# Patient Record
Sex: Male | Born: 1935 | Race: White | Hispanic: No | Marital: Married | Smoking: Former smoker
Health system: Southern US, Community
[De-identification: ages and names within clinical notes are randomized; demographics above are authoritative.]

## PROBLEM LIST (undated history)

## (undated) DIAGNOSIS — I219 Acute myocardial infarction, unspecified: Secondary | ICD-10-CM

## (undated) DIAGNOSIS — G2581 Restless legs syndrome: Secondary | ICD-10-CM

## (undated) DIAGNOSIS — I714 Abdominal aortic aneurysm, without rupture, unspecified: Secondary | ICD-10-CM

## (undated) DIAGNOSIS — Z8711 Personal history of peptic ulcer disease: Secondary | ICD-10-CM

## (undated) DIAGNOSIS — I739 Peripheral vascular disease, unspecified: Secondary | ICD-10-CM

## (undated) DIAGNOSIS — G4730 Sleep apnea, unspecified: Secondary | ICD-10-CM

## (undated) DIAGNOSIS — Z860100 Personal history of colon polyps, unspecified: Secondary | ICD-10-CM

## (undated) DIAGNOSIS — D649 Anemia, unspecified: Secondary | ICD-10-CM

## (undated) DIAGNOSIS — Z9289 Personal history of other medical treatment: Secondary | ICD-10-CM

## (undated) DIAGNOSIS — E785 Hyperlipidemia, unspecified: Secondary | ICD-10-CM

## (undated) DIAGNOSIS — I2510 Atherosclerotic heart disease of native coronary artery without angina pectoris: Secondary | ICD-10-CM

## (undated) DIAGNOSIS — H3530 Unspecified macular degeneration: Secondary | ICD-10-CM

## (undated) DIAGNOSIS — I10 Essential (primary) hypertension: Secondary | ICD-10-CM

## (undated) DIAGNOSIS — Z87898 Personal history of other specified conditions: Secondary | ICD-10-CM

## (undated) DIAGNOSIS — I4891 Unspecified atrial fibrillation: Secondary | ICD-10-CM

## (undated) DIAGNOSIS — R42 Dizziness and giddiness: Secondary | ICD-10-CM

## (undated) HISTORY — DX: Personal history of other specified conditions: Z87.898

## (undated) HISTORY — DX: Abdominal aortic aneurysm, without rupture: I71.4

## (undated) HISTORY — DX: Dizziness and giddiness: R42

## (undated) HISTORY — DX: Abdominal aortic aneurysm, without rupture, unspecified: I71.40

## (undated) HISTORY — DX: Personal history of other medical treatment: Z92.89

## (undated) HISTORY — DX: Peripheral vascular disease, unspecified: I73.9

## (undated) HISTORY — DX: Restless legs syndrome: G25.81

## (undated) HISTORY — PX: CATARACT EXTRACTION: SUR2

## (undated) HISTORY — DX: Sleep apnea, unspecified: G47.30

## (undated) HISTORY — DX: Unspecified macular degeneration: H35.30

## (undated) HISTORY — DX: Personal history of colonic polyps: Z86.010

## (undated) HISTORY — DX: Unspecified atrial fibrillation: I48.91

## (undated) HISTORY — PX: ABDOMINAL AORTIC ANEURYSM REPAIR: SUR1152

## (undated) HISTORY — DX: Hyperlipidemia, unspecified: E78.5

## (undated) HISTORY — DX: Personal history of colon polyps, unspecified: Z86.0100

## (undated) HISTORY — DX: Personal history of peptic ulcer disease: Z87.11

## (undated) HISTORY — DX: Acute myocardial infarction, unspecified: I21.9

## (undated) HISTORY — DX: Essential (primary) hypertension: I10

## (undated) HISTORY — PX: INGUINAL HERNIA REPAIR: SUR1180

## (undated) HISTORY — DX: Atherosclerotic heart disease of native coronary artery without angina pectoris: I25.10

## (undated) HISTORY — DX: Anemia, unspecified: D64.9

---

## 1995-12-23 HISTORY — PX: CORONARY ARTERY BYPASS GRAFT: SHX141

## 1999-06-21 ENCOUNTER — Emergency Department (HOSPITAL_COMMUNITY): Admission: EM | Admit: 1999-06-21 | Discharge: 1999-06-21 | Payer: Self-pay | Admitting: Emergency Medicine

## 1999-06-21 ENCOUNTER — Encounter: Payer: Self-pay | Admitting: Emergency Medicine

## 2000-02-22 LAB — HM COLONOSCOPY

## 2000-07-07 ENCOUNTER — Ambulatory Visit (HOSPITAL_COMMUNITY): Admission: RE | Admit: 2000-07-07 | Discharge: 2000-07-07 | Payer: Self-pay | Admitting: Gastroenterology

## 2000-07-27 ENCOUNTER — Encounter: Payer: Self-pay | Admitting: Emergency Medicine

## 2000-07-27 ENCOUNTER — Emergency Department (HOSPITAL_COMMUNITY): Admission: EM | Admit: 2000-07-27 | Discharge: 2000-07-27 | Payer: Self-pay | Admitting: *Deleted

## 2000-07-31 ENCOUNTER — Inpatient Hospital Stay (HOSPITAL_COMMUNITY): Admission: EM | Admit: 2000-07-31 | Discharge: 2000-08-03 | Payer: Self-pay | Admitting: Emergency Medicine

## 2000-07-31 ENCOUNTER — Encounter: Payer: Self-pay | Admitting: Emergency Medicine

## 2004-01-12 ENCOUNTER — Ambulatory Visit: Payer: Self-pay | Admitting: Internal Medicine

## 2004-02-09 ENCOUNTER — Ambulatory Visit: Payer: Self-pay | Admitting: Internal Medicine

## 2004-03-08 ENCOUNTER — Ambulatory Visit: Payer: Self-pay | Admitting: Internal Medicine

## 2004-04-08 ENCOUNTER — Ambulatory Visit: Payer: Self-pay | Admitting: Internal Medicine

## 2004-04-29 ENCOUNTER — Ambulatory Visit: Payer: Self-pay | Admitting: Internal Medicine

## 2004-06-07 ENCOUNTER — Ambulatory Visit: Payer: Self-pay | Admitting: Internal Medicine

## 2004-06-21 ENCOUNTER — Ambulatory Visit: Payer: Self-pay | Admitting: Internal Medicine

## 2004-07-05 ENCOUNTER — Ambulatory Visit: Payer: Self-pay | Admitting: Internal Medicine

## 2004-08-05 ENCOUNTER — Ambulatory Visit: Payer: Self-pay | Admitting: Internal Medicine

## 2004-09-06 ENCOUNTER — Ambulatory Visit: Payer: Self-pay | Admitting: Internal Medicine

## 2004-10-06 ENCOUNTER — Ambulatory Visit: Payer: Self-pay | Admitting: Internal Medicine

## 2004-11-05 ENCOUNTER — Ambulatory Visit: Payer: Self-pay | Admitting: Internal Medicine

## 2004-12-14 ENCOUNTER — Ambulatory Visit: Payer: Self-pay | Admitting: Internal Medicine

## 2005-01-12 ENCOUNTER — Ambulatory Visit: Payer: Self-pay | Admitting: Internal Medicine

## 2005-01-25 ENCOUNTER — Ambulatory Visit: Payer: Self-pay | Admitting: Internal Medicine

## 2005-02-09 ENCOUNTER — Ambulatory Visit: Payer: Self-pay | Admitting: Internal Medicine

## 2005-02-21 DIAGNOSIS — Z9289 Personal history of other medical treatment: Secondary | ICD-10-CM

## 2005-02-21 HISTORY — DX: Personal history of other medical treatment: Z92.89

## 2005-03-15 ENCOUNTER — Ambulatory Visit: Payer: Self-pay | Admitting: Internal Medicine

## 2005-04-12 ENCOUNTER — Ambulatory Visit: Payer: Self-pay | Admitting: Internal Medicine

## 2005-05-10 ENCOUNTER — Ambulatory Visit: Payer: Self-pay | Admitting: Internal Medicine

## 2005-05-28 ENCOUNTER — Inpatient Hospital Stay (HOSPITAL_COMMUNITY): Admission: EM | Admit: 2005-05-28 | Discharge: 2005-05-31 | Payer: Self-pay | Admitting: Emergency Medicine

## 2005-05-28 ENCOUNTER — Ambulatory Visit: Payer: Self-pay | Admitting: Internal Medicine

## 2005-05-30 ENCOUNTER — Encounter: Payer: Self-pay | Admitting: Vascular Surgery

## 2005-06-02 ENCOUNTER — Ambulatory Visit: Payer: Self-pay | Admitting: Internal Medicine

## 2005-06-06 ENCOUNTER — Ambulatory Visit: Payer: Self-pay | Admitting: Internal Medicine

## 2005-06-30 ENCOUNTER — Ambulatory Visit: Payer: Self-pay | Admitting: Internal Medicine

## 2005-08-02 ENCOUNTER — Ambulatory Visit: Payer: Self-pay | Admitting: Internal Medicine

## 2005-09-07 ENCOUNTER — Ambulatory Visit: Payer: Self-pay | Admitting: Internal Medicine

## 2005-10-11 ENCOUNTER — Ambulatory Visit: Payer: Self-pay | Admitting: Internal Medicine

## 2005-11-11 ENCOUNTER — Ambulatory Visit: Payer: Self-pay | Admitting: Internal Medicine

## 2005-12-08 ENCOUNTER — Ambulatory Visit: Payer: Self-pay | Admitting: Internal Medicine

## 2006-01-05 ENCOUNTER — Ambulatory Visit: Payer: Self-pay | Admitting: Internal Medicine

## 2006-02-01 ENCOUNTER — Ambulatory Visit: Payer: Self-pay | Admitting: Internal Medicine

## 2006-02-09 ENCOUNTER — Ambulatory Visit: Payer: Self-pay | Admitting: Internal Medicine

## 2006-03-10 ENCOUNTER — Ambulatory Visit: Payer: Self-pay | Admitting: Internal Medicine

## 2006-03-10 LAB — CONVERTED CEMR LAB
Albumin: 3.7 g/dL (ref 3.5–5.2)
Alkaline Phosphatase: 41 units/L (ref 39–117)
BUN: 22 mg/dL (ref 6–23)
GFR calc Af Amer: 63 mL/min
MCHC: 33.1 g/dL (ref 30.0–36.0)
Platelets: 316 10*3/uL (ref 150–400)
Potassium: 4.7 meq/L (ref 3.5–5.1)
RBC: 4.69 M/uL (ref 4.22–5.81)
RDW: 13.4 % (ref 11.5–14.6)
Total Protein: 6.8 g/dL (ref 6.0–8.3)

## 2006-04-03 ENCOUNTER — Ambulatory Visit: Payer: Self-pay | Admitting: Internal Medicine

## 2006-04-07 ENCOUNTER — Ambulatory Visit: Payer: Self-pay | Admitting: Internal Medicine

## 2006-05-03 ENCOUNTER — Ambulatory Visit: Payer: Self-pay | Admitting: Internal Medicine

## 2006-06-13 ENCOUNTER — Ambulatory Visit: Payer: Self-pay | Admitting: Internal Medicine

## 2006-06-13 LAB — CONVERTED CEMR LAB
Albumin: 3.6 g/dL (ref 3.5–5.2)
Alkaline Phosphatase: 35 units/L — ABNORMAL LOW (ref 39–117)
BUN: 22 mg/dL (ref 6–23)
Basophils Absolute: 0 10*3/uL (ref 0.0–0.1)
Cholesterol: 137 mg/dL (ref 0–200)
Creatinine, Ser: 1.2 mg/dL (ref 0.4–1.5)
GFR calc Af Amer: 75 mL/min
GFR calc non Af Amer: 62 mL/min
HDL: 41.9 mg/dL (ref >39.0)
Hemoglobin: 14 g/dL (ref 13.0–17.0)
LDL Cholesterol: 64 mg/dL (ref 0–99)
MCHC: 33.8 g/dL (ref 30.0–36.0)
Monocytes Absolute: 0.8 10*3/uL — ABNORMAL HIGH (ref 0.2–0.7)
Monocytes Relative: 9.9 % (ref 3.0–11.0)
Neutro Abs: 5.2 10*3/uL (ref 1.4–7.7)
Potassium: 4.4 meq/L (ref 3.5–5.1)
RDW: 14.1 % (ref 11.5–14.6)
Total CHOL/HDL Ratio: 3.3

## 2006-06-27 ENCOUNTER — Ambulatory Visit: Payer: Self-pay | Admitting: Internal Medicine

## 2006-07-11 ENCOUNTER — Ambulatory Visit: Payer: Self-pay | Admitting: Internal Medicine

## 2006-08-10 ENCOUNTER — Ambulatory Visit: Payer: Self-pay | Admitting: Internal Medicine

## 2006-08-18 DIAGNOSIS — Z860100 Personal history of colon polyps, unspecified: Secondary | ICD-10-CM | POA: Insufficient documentation

## 2006-08-18 DIAGNOSIS — I4892 Unspecified atrial flutter: Secondary | ICD-10-CM | POA: Insufficient documentation

## 2006-08-18 DIAGNOSIS — H3530 Unspecified macular degeneration: Secondary | ICD-10-CM | POA: Insufficient documentation

## 2006-08-18 DIAGNOSIS — I10 Essential (primary) hypertension: Secondary | ICD-10-CM | POA: Insufficient documentation

## 2006-08-18 DIAGNOSIS — Z9989 Dependence on other enabling machines and devices: Secondary | ICD-10-CM | POA: Insufficient documentation

## 2006-08-18 DIAGNOSIS — G4733 Obstructive sleep apnea (adult) (pediatric): Secondary | ICD-10-CM

## 2006-08-18 DIAGNOSIS — Z8711 Personal history of peptic ulcer disease: Secondary | ICD-10-CM | POA: Insufficient documentation

## 2006-08-18 DIAGNOSIS — N400 Enlarged prostate without lower urinary tract symptoms: Secondary | ICD-10-CM | POA: Insufficient documentation

## 2006-08-18 DIAGNOSIS — I2510 Atherosclerotic heart disease of native coronary artery without angina pectoris: Secondary | ICD-10-CM | POA: Insufficient documentation

## 2006-09-12 ENCOUNTER — Ambulatory Visit: Payer: Self-pay | Admitting: Internal Medicine

## 2006-09-12 DIAGNOSIS — H6120 Impacted cerumen, unspecified ear: Secondary | ICD-10-CM | POA: Insufficient documentation

## 2006-10-05 ENCOUNTER — Telehealth: Payer: Self-pay | Admitting: Internal Medicine

## 2006-10-10 ENCOUNTER — Ambulatory Visit: Payer: Self-pay | Admitting: Internal Medicine

## 2006-10-10 DIAGNOSIS — I4891 Unspecified atrial fibrillation: Secondary | ICD-10-CM | POA: Insufficient documentation

## 2006-10-10 LAB — CONVERTED CEMR LAB
INR: 2.7
Prothrombin Time: 20.1 s

## 2006-11-03 ENCOUNTER — Ambulatory Visit: Payer: Self-pay | Admitting: Internal Medicine

## 2006-11-03 LAB — CONVERTED CEMR LAB
INR: 2.7
Prothrombin Time: 19.9 s

## 2006-12-05 ENCOUNTER — Ambulatory Visit: Payer: Self-pay | Admitting: Internal Medicine

## 2007-01-09 ENCOUNTER — Ambulatory Visit: Payer: Self-pay | Admitting: Internal Medicine

## 2007-02-08 ENCOUNTER — Ambulatory Visit: Payer: Self-pay | Admitting: Internal Medicine

## 2007-02-08 LAB — CONVERTED CEMR LAB: INR: 3.1

## 2007-03-06 ENCOUNTER — Ambulatory Visit: Payer: Self-pay | Admitting: Internal Medicine

## 2007-04-03 ENCOUNTER — Ambulatory Visit: Payer: Self-pay | Admitting: Internal Medicine

## 2007-04-03 ENCOUNTER — Telehealth: Payer: Self-pay | Admitting: Internal Medicine

## 2007-05-02 ENCOUNTER — Ambulatory Visit: Payer: Self-pay | Admitting: Internal Medicine

## 2007-05-02 LAB — CONVERTED CEMR LAB: Prothrombin Time: 19.9 s

## 2007-06-04 ENCOUNTER — Ambulatory Visit: Payer: Self-pay | Admitting: Internal Medicine

## 2007-06-18 LAB — CONVERTED CEMR LAB: INR: 3.3

## 2007-07-02 ENCOUNTER — Ambulatory Visit: Payer: Self-pay | Admitting: Internal Medicine

## 2007-07-02 LAB — CONVERTED CEMR LAB: INR: 3.6

## 2007-07-10 ENCOUNTER — Telehealth: Payer: Self-pay | Admitting: Internal Medicine

## 2007-07-31 ENCOUNTER — Ambulatory Visit: Payer: Self-pay | Admitting: Internal Medicine

## 2007-07-31 LAB — CONVERTED CEMR LAB: Prothrombin Time: 17.4 s

## 2007-08-28 ENCOUNTER — Ambulatory Visit: Payer: Self-pay | Admitting: Internal Medicine

## 2007-08-28 LAB — CONVERTED CEMR LAB
INR: 1.7
Prothrombin Time: 16.1 s

## 2007-09-07 ENCOUNTER — Ambulatory Visit: Payer: Self-pay | Admitting: Internal Medicine

## 2007-09-07 DIAGNOSIS — L259 Unspecified contact dermatitis, unspecified cause: Secondary | ICD-10-CM | POA: Insufficient documentation

## 2007-09-25 ENCOUNTER — Ambulatory Visit: Payer: Self-pay | Admitting: Internal Medicine

## 2007-09-25 LAB — CONVERTED CEMR LAB: Prothrombin Time: 19.5 s

## 2007-11-01 ENCOUNTER — Ambulatory Visit: Payer: Self-pay | Admitting: Internal Medicine

## 2007-11-01 LAB — CONVERTED CEMR LAB: Prothrombin Time: 19.5 s

## 2007-11-07 ENCOUNTER — Ambulatory Visit: Payer: Self-pay | Admitting: Internal Medicine

## 2007-11-07 ENCOUNTER — Telehealth: Payer: Self-pay | Admitting: Internal Medicine

## 2007-11-07 DIAGNOSIS — M549 Dorsalgia, unspecified: Secondary | ICD-10-CM | POA: Insufficient documentation

## 2007-11-09 ENCOUNTER — Ambulatory Visit: Payer: Self-pay | Admitting: Internal Medicine

## 2007-11-17 ENCOUNTER — Emergency Department (HOSPITAL_COMMUNITY): Admission: EM | Admit: 2007-11-17 | Discharge: 2007-11-17 | Payer: Self-pay | Admitting: Emergency Medicine

## 2007-11-19 ENCOUNTER — Ambulatory Visit: Payer: Self-pay | Admitting: Internal Medicine

## 2007-11-19 DIAGNOSIS — K5900 Constipation, unspecified: Secondary | ICD-10-CM | POA: Insufficient documentation

## 2007-12-12 ENCOUNTER — Ambulatory Visit: Payer: Self-pay | Admitting: Internal Medicine

## 2007-12-12 LAB — CONVERTED CEMR LAB
ALT: 30 units/L (ref 0–53)
AST: 25 units/L (ref 0–37)
Alkaline Phosphatase: 43 units/L (ref 39–117)
Basophils Absolute: 0 10*3/uL (ref 0.0–0.1)
Basophils Relative: 0.2 % (ref 0.0–3.0)
Bilirubin, Direct: 0.1 mg/dL (ref 0.0–0.3)
CO2: 30 meq/L (ref 19–32)
Chloride: 107 meq/L (ref 96–112)
Cholesterol: 156 mg/dL (ref 0–200)
Creatinine, Ser: 1.4 mg/dL (ref 0.4–1.5)
Eosinophils Absolute: 0.2 10*3/uL (ref 0.0–0.7)
GFR calc non Af Amer: 52 mL/min
HDL: 40.1 mg/dL (ref >39.0)
LDL Cholesterol: 77 mg/dL (ref 0–99)
Lymphocytes Relative: 15.5 % (ref 12.0–46.0)
MCHC: 34.8 g/dL (ref 30.0–36.0)
Neutrophils Relative %: 74.7 % (ref 43.0–77.0)
Platelets: 223 10*3/uL (ref 150–400)
Potassium: 4.8 meq/L (ref 3.5–5.1)
RBC: 4.52 M/uL (ref 4.22–5.81)
Total Bilirubin: 0.8 mg/dL (ref 0.3–1.2)
VLDL: 39 mg/dL (ref 0–40)
WBC: 9.2 10*3/uL (ref 4.5–10.5)

## 2008-01-07 ENCOUNTER — Ambulatory Visit: Payer: Self-pay | Admitting: Internal Medicine

## 2008-01-29 ENCOUNTER — Ambulatory Visit: Payer: Self-pay | Admitting: Internal Medicine

## 2008-01-29 LAB — CONVERTED CEMR LAB
INR: 2.8
Prothrombin Time: 20.2 s

## 2008-03-06 ENCOUNTER — Ambulatory Visit: Payer: Self-pay | Admitting: Internal Medicine

## 2008-03-06 LAB — CONVERTED CEMR LAB
INR: 2.8
Prothrombin Time: 20.2 s

## 2008-04-10 ENCOUNTER — Telehealth: Payer: Self-pay | Admitting: Internal Medicine

## 2008-04-10 ENCOUNTER — Ambulatory Visit: Payer: Self-pay | Admitting: Internal Medicine

## 2008-04-10 LAB — CONVERTED CEMR LAB
INR: 3.7
Prothrombin Time: 23.2 s

## 2008-05-08 ENCOUNTER — Ambulatory Visit: Payer: Self-pay | Admitting: Internal Medicine

## 2008-06-13 ENCOUNTER — Ambulatory Visit: Payer: Self-pay | Admitting: Internal Medicine

## 2008-06-13 LAB — CONVERTED CEMR LAB: Prothrombin Time: 17.8 s

## 2008-06-21 ENCOUNTER — Telehealth: Payer: Self-pay | Admitting: Family Medicine

## 2008-06-23 ENCOUNTER — Telehealth: Payer: Self-pay | Admitting: Internal Medicine

## 2008-06-23 ENCOUNTER — Ambulatory Visit: Payer: Self-pay | Admitting: Internal Medicine

## 2008-06-23 DIAGNOSIS — R5383 Other fatigue: Secondary | ICD-10-CM

## 2008-06-23 DIAGNOSIS — R5381 Other malaise: Secondary | ICD-10-CM | POA: Insufficient documentation

## 2008-06-23 LAB — CONVERTED CEMR LAB
AST: 26 units/L (ref 0–37)
Albumin: 3.9 g/dL (ref 3.5–5.2)
Alkaline Phosphatase: 45 units/L (ref 39–117)
Basophils Absolute: 0.1 10*3/uL (ref 0.0–0.1)
Bilirubin, Direct: 0.2 mg/dL (ref 0.0–0.3)
CO2: 28 meq/L (ref 19–32)
Calcium: 9.7 mg/dL (ref 8.4–10.5)
Eosinophils Relative: 2.1 % (ref 0.0–5.0)
GFR calc non Af Amer: 51.5 mL/min (ref >60)
Glucose, Bld: 89 mg/dL (ref 70–99)
Lymphs Abs: 1.2 10*3/uL (ref 0.7–4.0)
Monocytes Absolute: 0.3 10*3/uL (ref 0.1–1.0)
Monocytes Relative: 4.3 % (ref 3.0–12.0)
Neutrophils Relative %: 77.4 % — ABNORMAL HIGH (ref 43.0–77.0)
Platelets: 198 10*3/uL (ref 150.0–400.0)
Potassium: 5.1 meq/L (ref 3.5–5.1)
RDW: 13.5 % (ref 11.5–14.6)
Sodium: 140 meq/L (ref 135–145)
WBC: 7.8 10*3/uL (ref 4.5–10.5)

## 2008-07-02 ENCOUNTER — Ambulatory Visit: Payer: Self-pay | Admitting: Internal Medicine

## 2008-07-02 LAB — CONVERTED CEMR LAB: INR: 1.8

## 2008-07-28 ENCOUNTER — Telehealth (INDEPENDENT_AMBULATORY_CARE_PROVIDER_SITE_OTHER): Payer: Self-pay | Admitting: *Deleted

## 2008-07-28 ENCOUNTER — Ambulatory Visit: Payer: Self-pay | Admitting: Internal Medicine

## 2008-07-28 DIAGNOSIS — R42 Dizziness and giddiness: Secondary | ICD-10-CM | POA: Insufficient documentation

## 2008-07-28 LAB — CONVERTED CEMR LAB: Prothrombin Time: 13.8 s

## 2008-08-01 ENCOUNTER — Inpatient Hospital Stay (HOSPITAL_COMMUNITY): Admission: EM | Admit: 2008-08-01 | Discharge: 2008-08-05 | Payer: Self-pay | Admitting: Emergency Medicine

## 2008-08-01 ENCOUNTER — Ambulatory Visit: Payer: Self-pay

## 2008-08-01 ENCOUNTER — Encounter: Payer: Self-pay | Admitting: Internal Medicine

## 2008-08-01 ENCOUNTER — Telehealth: Payer: Self-pay | Admitting: Family Medicine

## 2008-08-04 ENCOUNTER — Encounter (INDEPENDENT_AMBULATORY_CARE_PROVIDER_SITE_OTHER): Payer: Self-pay | Admitting: Cardiology

## 2008-08-05 ENCOUNTER — Telehealth: Payer: Self-pay | Admitting: Internal Medicine

## 2008-08-08 ENCOUNTER — Telehealth: Payer: Self-pay | Admitting: Internal Medicine

## 2008-08-08 ENCOUNTER — Ambulatory Visit: Payer: Self-pay | Admitting: Internal Medicine

## 2008-08-08 LAB — CONVERTED CEMR LAB: INR: 5

## 2008-08-21 ENCOUNTER — Ambulatory Visit: Payer: Self-pay | Admitting: Internal Medicine

## 2008-08-21 LAB — CONVERTED CEMR LAB
INR: 2
Prothrombin Time: 17.6 s

## 2008-09-03 ENCOUNTER — Ambulatory Visit: Payer: Self-pay | Admitting: Family Medicine

## 2008-09-03 LAB — CONVERTED CEMR LAB
Albumin: 3.3 g/dL — ABNORMAL LOW (ref 3.5–5.2)
Alkaline Phosphatase: 42 units/L (ref 39–117)
BUN: 34 mg/dL — ABNORMAL HIGH (ref 6–23)
Basophils Absolute: 0.1 10*3/uL (ref 0.0–0.1)
Calcium: 9 mg/dL (ref 8.4–10.5)
Creatinine, Ser: 2.2 mg/dL — ABNORMAL HIGH (ref 0.4–1.5)
GFR calc non Af Amer: 30.56 mL/min (ref >60)
Glucose, Bld: 154 mg/dL — ABNORMAL HIGH (ref 70–99)
HCT: 39.8 % (ref 39.0–52.0)
Lymphocytes Relative: 5.7 % — ABNORMAL LOW (ref 12.0–46.0)
Lymphs Abs: 1 10*3/uL (ref 0.7–4.0)
Monocytes Relative: 4.8 % (ref 3.0–12.0)
Neutrophils Relative %: 88.9 % — ABNORMAL HIGH (ref 43.0–77.0)
Nitrite: NEGATIVE
Platelets: 198 10*3/uL (ref 150.0–400.0)
RDW: 13.7 % (ref 11.5–14.6)
Specific Gravity, Urine: 1.015
TSH: 1.35 microintl units/mL (ref 0.35–5.50)
Total Bilirubin: 1 mg/dL (ref 0.3–1.2)
Urobilinogen, UA: 0.2
WBC Urine, dipstick: NEGATIVE
WBC: 17.3 10*3/uL — ABNORMAL HIGH (ref 4.5–10.5)

## 2008-09-04 ENCOUNTER — Ambulatory Visit: Payer: Self-pay | Admitting: Internal Medicine

## 2008-09-04 ENCOUNTER — Inpatient Hospital Stay (HOSPITAL_COMMUNITY): Admission: AD | Admit: 2008-09-04 | Discharge: 2008-09-11 | Payer: Self-pay | Admitting: Internal Medicine

## 2008-09-04 DIAGNOSIS — L02419 Cutaneous abscess of limb, unspecified: Secondary | ICD-10-CM | POA: Insufficient documentation

## 2008-09-04 DIAGNOSIS — I959 Hypotension, unspecified: Secondary | ICD-10-CM | POA: Insufficient documentation

## 2008-09-04 DIAGNOSIS — L03119 Cellulitis of unspecified part of limb: Secondary | ICD-10-CM

## 2008-09-04 DIAGNOSIS — E860 Dehydration: Secondary | ICD-10-CM | POA: Insufficient documentation

## 2008-09-05 ENCOUNTER — Encounter: Payer: Self-pay | Admitting: Internal Medicine

## 2008-09-05 ENCOUNTER — Encounter (INDEPENDENT_AMBULATORY_CARE_PROVIDER_SITE_OTHER): Payer: Self-pay | Admitting: Internal Medicine

## 2008-09-05 ENCOUNTER — Ambulatory Visit: Payer: Self-pay | Admitting: Vascular Surgery

## 2008-09-15 ENCOUNTER — Ambulatory Visit: Payer: Self-pay | Admitting: Internal Medicine

## 2008-09-15 DIAGNOSIS — Z95828 Presence of other vascular implants and grafts: Secondary | ICD-10-CM | POA: Insufficient documentation

## 2008-09-29 ENCOUNTER — Ambulatory Visit: Payer: Self-pay | Admitting: Internal Medicine

## 2008-09-29 LAB — CONVERTED CEMR LAB: Prothrombin Time: 16.2 s

## 2008-10-02 ENCOUNTER — Ambulatory Visit: Payer: Self-pay | Admitting: Internal Medicine

## 2008-10-07 ENCOUNTER — Ambulatory Visit: Payer: Self-pay | Admitting: Internal Medicine

## 2008-10-21 ENCOUNTER — Encounter: Payer: Self-pay | Admitting: Internal Medicine

## 2008-10-23 ENCOUNTER — Ambulatory Visit: Payer: Self-pay | Admitting: Internal Medicine

## 2008-10-23 LAB — CONVERTED CEMR LAB
INR: 1.5
Prothrombin Time: 15 s

## 2008-11-13 ENCOUNTER — Ambulatory Visit: Payer: Self-pay | Admitting: Internal Medicine

## 2008-11-13 LAB — CONVERTED CEMR LAB: Prothrombin Time: 22.3 s

## 2008-12-01 ENCOUNTER — Ambulatory Visit: Payer: Self-pay | Admitting: Internal Medicine

## 2008-12-01 LAB — CONVERTED CEMR LAB: Prothrombin Time: 19 s

## 2009-01-09 ENCOUNTER — Ambulatory Visit: Payer: Self-pay | Admitting: Internal Medicine

## 2009-01-09 LAB — CONVERTED CEMR LAB: Prothrombin Time: 21 s

## 2009-01-12 ENCOUNTER — Ambulatory Visit: Payer: Self-pay | Admitting: Internal Medicine

## 2009-01-12 ENCOUNTER — Inpatient Hospital Stay (HOSPITAL_COMMUNITY): Admission: EM | Admit: 2009-01-12 | Discharge: 2009-01-14 | Payer: Self-pay | Admitting: Emergency Medicine

## 2009-01-19 ENCOUNTER — Ambulatory Visit: Payer: Self-pay | Admitting: Internal Medicine

## 2009-01-30 ENCOUNTER — Ambulatory Visit: Payer: Self-pay | Admitting: Internal Medicine

## 2009-02-12 ENCOUNTER — Ambulatory Visit: Payer: Self-pay | Admitting: Internal Medicine

## 2009-02-27 ENCOUNTER — Ambulatory Visit: Payer: Self-pay | Admitting: Internal Medicine

## 2009-03-09 ENCOUNTER — Telehealth: Payer: Self-pay | Admitting: Internal Medicine

## 2009-04-03 ENCOUNTER — Ambulatory Visit: Payer: Self-pay | Admitting: Internal Medicine

## 2009-04-03 DIAGNOSIS — J069 Acute upper respiratory infection, unspecified: Secondary | ICD-10-CM | POA: Insufficient documentation

## 2009-04-03 LAB — CONVERTED CEMR LAB: INR: 15

## 2009-04-13 ENCOUNTER — Telehealth: Payer: Self-pay | Admitting: Internal Medicine

## 2009-04-24 ENCOUNTER — Ambulatory Visit: Payer: Self-pay | Admitting: Internal Medicine

## 2009-05-14 ENCOUNTER — Ambulatory Visit: Payer: Self-pay | Admitting: Internal Medicine

## 2009-05-14 LAB — CONVERTED CEMR LAB
INR: 1.6
Prothrombin Time: 15.3 s

## 2009-05-29 ENCOUNTER — Ambulatory Visit: Payer: Self-pay | Admitting: Internal Medicine

## 2009-05-29 LAB — CONVERTED CEMR LAB
INR: 1.9
Prothrombin Time: 17 s

## 2009-06-26 ENCOUNTER — Ambulatory Visit: Payer: Self-pay | Admitting: Internal Medicine

## 2009-07-24 ENCOUNTER — Ambulatory Visit: Payer: Self-pay | Admitting: Internal Medicine

## 2009-08-07 ENCOUNTER — Encounter: Payer: Self-pay | Admitting: Internal Medicine

## 2009-08-19 ENCOUNTER — Ambulatory Visit: Payer: Self-pay | Admitting: Internal Medicine

## 2009-08-28 ENCOUNTER — Ambulatory Visit: Payer: Self-pay | Admitting: Internal Medicine

## 2009-08-28 LAB — CONVERTED CEMR LAB: INR: 2.6

## 2009-10-02 ENCOUNTER — Ambulatory Visit: Payer: Self-pay | Admitting: Internal Medicine

## 2009-10-23 ENCOUNTER — Ambulatory Visit: Payer: Self-pay | Admitting: Internal Medicine

## 2009-12-04 ENCOUNTER — Ambulatory Visit: Payer: Self-pay | Admitting: Internal Medicine

## 2009-12-04 DIAGNOSIS — E785 Hyperlipidemia, unspecified: Secondary | ICD-10-CM | POA: Insufficient documentation

## 2009-12-04 DIAGNOSIS — G2581 Restless legs syndrome: Secondary | ICD-10-CM | POA: Insufficient documentation

## 2009-12-25 ENCOUNTER — Ambulatory Visit: Payer: Self-pay | Admitting: Internal Medicine

## 2010-01-22 ENCOUNTER — Ambulatory Visit: Payer: Self-pay | Admitting: Internal Medicine

## 2010-01-29 ENCOUNTER — Telehealth: Payer: Self-pay | Admitting: Internal Medicine

## 2010-02-05 ENCOUNTER — Telehealth: Payer: Self-pay | Admitting: Internal Medicine

## 2010-02-18 ENCOUNTER — Ambulatory Visit: Payer: Self-pay | Admitting: Internal Medicine

## 2010-02-18 LAB — CONVERTED CEMR LAB: INR: 2.6

## 2010-02-21 DIAGNOSIS — D649 Anemia, unspecified: Secondary | ICD-10-CM

## 2010-02-21 DIAGNOSIS — I219 Acute myocardial infarction, unspecified: Secondary | ICD-10-CM

## 2010-02-21 HISTORY — PX: CARDIAC CATHETERIZATION: SHX172

## 2010-02-21 HISTORY — DX: Anemia, unspecified: D64.9

## 2010-02-21 HISTORY — DX: Acute myocardial infarction, unspecified: I21.9

## 2010-03-15 ENCOUNTER — Inpatient Hospital Stay (HOSPITAL_COMMUNITY)
Admission: EM | Admit: 2010-03-15 | Discharge: 2010-03-25 | DRG: 281 | Disposition: A | Discharge status: Home / Self Care | Payer: Medicare Other | Attending: Cardiology | Admitting: Cardiology

## 2010-03-15 DIAGNOSIS — I059 Rheumatic mitral valve disease, unspecified: Secondary | ICD-10-CM | POA: Diagnosis present

## 2010-03-15 DIAGNOSIS — I714 Abdominal aortic aneurysm, without rupture, unspecified: Secondary | ICD-10-CM | POA: Diagnosis present

## 2010-03-15 DIAGNOSIS — I25810 Atherosclerosis of coronary artery bypass graft(s) without angina pectoris: Secondary | ICD-10-CM | POA: Diagnosis present

## 2010-03-15 DIAGNOSIS — E785 Hyperlipidemia, unspecified: Secondary | ICD-10-CM | POA: Diagnosis present

## 2010-03-15 DIAGNOSIS — D509 Iron deficiency anemia, unspecified: Secondary | ICD-10-CM | POA: Diagnosis present

## 2010-03-15 DIAGNOSIS — I214 Non-ST elevation (NSTEMI) myocardial infarction: Principal | ICD-10-CM | POA: Diagnosis present

## 2010-03-15 DIAGNOSIS — I4892 Unspecified atrial flutter: Secondary | ICD-10-CM | POA: Diagnosis present

## 2010-03-15 DIAGNOSIS — H3530 Unspecified macular degeneration: Secondary | ICD-10-CM | POA: Diagnosis present

## 2010-03-15 DIAGNOSIS — H548 Legal blindness, as defined in USA: Secondary | ICD-10-CM | POA: Diagnosis present

## 2010-03-15 DIAGNOSIS — G4733 Obstructive sleep apnea (adult) (pediatric): Secondary | ICD-10-CM | POA: Diagnosis present

## 2010-03-15 DIAGNOSIS — D62 Acute posthemorrhagic anemia: Secondary | ICD-10-CM | POA: Diagnosis present

## 2010-03-15 DIAGNOSIS — I2582 Chronic total occlusion of coronary artery: Secondary | ICD-10-CM | POA: Diagnosis present

## 2010-03-16 LAB — POCT CARDIAC MARKERS
CKMB, poc: 2.8 ng/mL (ref 1.0–8.0)
Troponin i, poc: <0.05 ng/mL (ref 0.00–0.09)

## 2010-03-16 LAB — COMPREHENSIVE METABOLIC PANEL
ALT: 19 U/L (ref 0–53)
BUN: 18 mg/dL (ref 6–23)
CO2: 23 mEq/L (ref 19–32)
Calcium: 8.5 mg/dL (ref 8.4–10.5)
Creatinine, Ser: 1.09 mg/dL (ref 0.4–1.5)
GFR calc non Af Amer: >60 mL/min (ref >60)
Glucose, Bld: 87 mg/dL (ref 70–99)
Sodium: 140 mEq/L (ref 135–145)
Total Protein: 7 g/dL (ref 6.0–8.3)

## 2010-03-16 LAB — DIFFERENTIAL
Basophils Absolute: 0.1 10*3/uL (ref 0.0–0.1)
Eosinophils Absolute: 0.2 10*3/uL (ref 0.0–0.7)
Lymphocytes Relative: 17 % (ref 12–46)
Lymphs Abs: 1.4 10*3/uL (ref 0.7–4.0)
Neutrophils Relative %: 67 % (ref 43–77)

## 2010-03-16 LAB — CK TOTAL AND CKMB (NOT AT ARMC)
CK, MB: 4.5 ng/mL — ABNORMAL HIGH (ref 0.3–4.0)
Total CK: 118 U/L (ref 7–232)

## 2010-03-16 LAB — CBC
MCV: 74.4 fL — ABNORMAL LOW (ref 78.0–100.0)
Platelets: 443 10*3/uL — ABNORMAL HIGH (ref 150–400)
RBC: 3.59 MIL/uL — ABNORMAL LOW (ref 4.22–5.81)
RDW: 18.4 % — ABNORMAL HIGH (ref 11.5–15.5)
WBC: 8 10*3/uL (ref 4.0–10.5)

## 2010-03-16 LAB — PROTIME-INR: INR: 2.49 — ABNORMAL HIGH (ref 0.00–1.49)

## 2010-03-16 LAB — TROPONIN I: Troponin I: 0.16 ng/mL — ABNORMAL HIGH (ref 0.00–0.06)

## 2010-03-17 LAB — TYPE AND SCREEN
ABO/RH(D): A POS
Antibody Screen: NEGATIVE
Unit division: 0

## 2010-03-17 LAB — PROTIME-INR
INR: 2.33 — ABNORMAL HIGH (ref 0.00–1.49)
INR: 1.89 — ABNORMAL HIGH (ref 0.00–1.49)
Prothrombin Time: 25.7 seconds — ABNORMAL HIGH (ref 11.6–15.2)
Prothrombin Time: 21.9 seconds — ABNORMAL HIGH (ref 11.6–15.2)

## 2010-03-17 LAB — GLUCOSE, CAPILLARY: Glucose-Capillary: 116 mg/dL — ABNORMAL HIGH (ref 70–99)

## 2010-03-17 LAB — FERRITIN: Ferritin: 7 ng/mL — ABNORMAL LOW (ref 22–322)

## 2010-03-17 LAB — CARDIAC PANEL(CRET KIN+CKTOT+MB+TROPI)
CK, MB: 10.2 ng/mL (ref 0.3–4.0)
Relative Index: 7.6 — ABNORMAL HIGH (ref 0.0–2.5)
Relative Index: 6.3 — ABNORMAL HIGH (ref 0.0–2.5)
Total CK: 164 U/L (ref 7–232)
Total CK: 163 U/L (ref 7–232)
Troponin I: 2.71 ng/mL (ref 0.00–0.06)

## 2010-03-17 LAB — CBC
HCT: 29.6 % — ABNORMAL LOW (ref 39.0–52.0)
Hemoglobin: 8.7 g/dL — ABNORMAL LOW (ref 13.0–17.0)
Hemoglobin: 8.8 g/dL — ABNORMAL LOW (ref 13.0–17.0)
MCHC: 29.4 g/dL — ABNORMAL LOW (ref 30.0–36.0)
Platelets: 297 10*3/uL (ref 150–400)
RBC: 3.92 MIL/uL — ABNORMAL LOW (ref 4.22–5.81)
RDW: 18.2 % — ABNORMAL HIGH (ref 11.5–15.5)
WBC: 9.4 10*3/uL (ref 4.0–10.5)
WBC: 7.9 10*3/uL (ref 4.0–10.5)

## 2010-03-17 LAB — DIFFERENTIAL
Basophils Absolute: 0 10*3/uL (ref 0.0–0.1)
Basophils Relative: 0 % (ref 0–1)
Eosinophils Absolute: 0.2 10*3/uL (ref 0.0–0.7)
Neutro Abs: 5.4 10*3/uL (ref 1.7–7.7)
Neutrophils Relative %: 68 % (ref 43–77)

## 2010-03-17 LAB — BASIC METABOLIC PANEL
CO2: 24 mEq/L (ref 19–32)
Chloride: 105 mEq/L (ref 96–112)
Creatinine, Ser: 1.29 mg/dL (ref 0.4–1.5)
GFR calc Af Amer: >60 mL/min (ref >60)
Potassium: 4.2 mEq/L (ref 3.5–5.1)
Sodium: 138 mEq/L (ref 135–145)

## 2010-03-17 LAB — COMPREHENSIVE METABOLIC PANEL
Albumin: 3.1 g/dL — ABNORMAL LOW (ref 3.5–5.2)
BUN: 15 mg/dL (ref 6–23)
Calcium: 8.6 mg/dL (ref 8.4–10.5)
Glucose, Bld: 118 mg/dL — ABNORMAL HIGH (ref 70–99)
Potassium: 4.5 mEq/L (ref 3.5–5.1)
Sodium: 141 mEq/L (ref 135–145)
Total Protein: 6.5 g/dL (ref 6.0–8.3)

## 2010-03-17 LAB — CK TOTAL AND CKMB (NOT AT ARMC): Relative Index: 9.5 — ABNORMAL HIGH (ref 0.0–2.5)

## 2010-03-17 LAB — TRANSFERRIN: Transferrin: 332 mg/dL (ref 212–360)

## 2010-03-17 LAB — TSH: TSH: 2.523 u[IU]/mL (ref 0.350–4.500)

## 2010-03-17 LAB — BRAIN NATRIURETIC PEPTIDE: Pro B Natriuretic peptide (BNP): 556 pg/mL — ABNORMAL HIGH (ref 0.0–100.0)

## 2010-03-17 LAB — PREPARE RBC (CROSSMATCH)

## 2010-03-17 LAB — MRSA PCR SCREENING: MRSA by PCR: NEGATIVE

## 2010-03-17 LAB — HEPARIN LEVEL (UNFRACTIONATED): Heparin Unfractionated: 0.22 IU/mL — ABNORMAL LOW (ref 0.30–0.70)

## 2010-03-18 LAB — BASIC METABOLIC PANEL
CO2: 24 mEq/L (ref 19–32)
Calcium: 8.7 mg/dL (ref 8.4–10.5)
Creatinine, Ser: 1.16 mg/dL (ref 0.4–1.5)
GFR calc Af Amer: >60 mL/min (ref >60)
GFR calc non Af Amer: 60 mL/min — ABNORMAL LOW (ref >60)
Glucose, Bld: 111 mg/dL — ABNORMAL HIGH (ref 70–99)

## 2010-03-18 LAB — PROTIME-INR
INR: 1.55 — ABNORMAL HIGH (ref 0.00–1.49)
Prothrombin Time: 18.8 seconds — ABNORMAL HIGH (ref 11.6–15.2)

## 2010-03-18 LAB — CBC
MCH: 22.4 pg — ABNORMAL LOW (ref 26.0–34.0)
Platelets: 289 10*3/uL (ref 150–400)
RBC: 4.01 MIL/uL — ABNORMAL LOW (ref 4.22–5.81)
WBC: 8.8 10*3/uL (ref 4.0–10.5)

## 2010-03-19 ENCOUNTER — Ambulatory Visit: Admit: 2010-03-19 | Payer: Self-pay | Admitting: Internal Medicine

## 2010-03-19 LAB — BASIC METABOLIC PANEL
CO2: 23 mEq/L (ref 19–32)
Calcium: 8.8 mg/dL (ref 8.4–10.5)
Creatinine, Ser: 1.34 mg/dL (ref 0.4–1.5)
Glucose, Bld: 109 mg/dL — ABNORMAL HIGH (ref 70–99)

## 2010-03-19 LAB — CBC
MCH: 22.3 pg — ABNORMAL LOW (ref 26.0–34.0)
MCV: 76.1 fL — ABNORMAL LOW (ref 78.0–100.0)
Platelets: 309 10*3/uL (ref 150–400)
RDW: 19.7 % — ABNORMAL HIGH (ref 11.5–15.5)
WBC: 9.5 10*3/uL (ref 4.0–10.5)

## 2010-03-20 LAB — BASIC METABOLIC PANEL
CO2: 23 mEq/L (ref 19–32)
Chloride: 105 mEq/L (ref 96–112)
Creatinine, Ser: 1.41 mg/dL (ref 0.4–1.5)
GFR calc Af Amer: 58 mL/min — ABNORMAL LOW (ref >60)
Potassium: 3.8 mEq/L (ref 3.5–5.1)

## 2010-03-20 LAB — CBC
HCT: 27.7 % — ABNORMAL LOW (ref 39.0–52.0)
Hemoglobin: 8.1 g/dL — ABNORMAL LOW (ref 13.0–17.0)
MCHC: 29.2 g/dL — ABNORMAL LOW (ref 30.0–36.0)
MCV: 76.3 fL — ABNORMAL LOW (ref 78.0–100.0)
RDW: 20.2 % — ABNORMAL HIGH (ref 11.5–15.5)
WBC: 7.1 10*3/uL (ref 4.0–10.5)

## 2010-03-21 LAB — CBC
HCT: 28.6 % — ABNORMAL LOW (ref 39.0–52.0)
MCH: 22.6 pg — ABNORMAL LOW (ref 26.0–34.0)
MCV: 76.1 fL — ABNORMAL LOW (ref 78.0–100.0)
Platelets: 281 10*3/uL (ref 150–400)
RBC: 3.76 MIL/uL — ABNORMAL LOW (ref 4.22–5.81)
WBC: 7.3 10*3/uL (ref 4.0–10.5)

## 2010-03-21 LAB — BASIC METABOLIC PANEL
BUN: 24 mg/dL — ABNORMAL HIGH (ref 6–23)
Calcium: 8.9 mg/dL (ref 8.4–10.5)
Creatinine, Ser: 1.33 mg/dL (ref 0.4–1.5)
GFR calc non Af Amer: 51 mL/min — ABNORMAL LOW (ref >60)
Glucose, Bld: 105 mg/dL — ABNORMAL HIGH (ref 70–99)
Sodium: 137 mEq/L (ref 135–145)

## 2010-03-21 LAB — PROTIME-INR: Prothrombin Time: 14.3 seconds (ref 11.6–15.2)

## 2010-03-22 LAB — BASIC METABOLIC PANEL
CO2: 25 mEq/L (ref 19–32)
Glucose, Bld: 88 mg/dL (ref 70–99)
Potassium: 4.4 mEq/L (ref 3.5–5.1)
Sodium: 139 mEq/L (ref 135–145)

## 2010-03-22 LAB — CARDIAC PANEL(CRET KIN+CKTOT+MB+TROPI)
CK, MB: 2 ng/mL (ref 0.3–4.0)
Relative Index: 1.8 (ref 0.0–2.5)

## 2010-03-22 LAB — CBC
HCT: 27.9 % — ABNORMAL LOW (ref 39.0–52.0)
Hemoglobin: 8.1 g/dL — ABNORMAL LOW (ref 13.0–17.0)
MCH: 22.6 pg — ABNORMAL LOW (ref 26.0–34.0)
MCV: 77.7 fL — ABNORMAL LOW (ref 78.0–100.0)
RBC: 3.59 MIL/uL — ABNORMAL LOW (ref 4.22–5.81)

## 2010-03-23 LAB — DIFFERENTIAL
Basophils Absolute: 0 10*3/uL (ref 0.0–0.1)
Basophils Relative: 1 % (ref 0–1)
Eosinophils Relative: 3 % (ref 0–5)
Lymphocytes Relative: 18 % (ref 12–46)
Neutro Abs: 5.5 10*3/uL (ref 1.7–7.7)

## 2010-03-23 LAB — HEMOGLOBIN AND HEMATOCRIT, BLOOD: Hemoglobin: 9.2 g/dL — ABNORMAL LOW (ref 13.0–17.0)

## 2010-03-23 LAB — CBC
HCT: 26 % — ABNORMAL LOW (ref 39.0–52.0)
Hemoglobin: 7.7 g/dL — ABNORMAL LOW (ref 13.0–17.0)
RDW: 20.8 % — ABNORMAL HIGH (ref 11.5–15.5)
WBC: 8.2 10*3/uL (ref 4.0–10.5)

## 2010-03-23 NOTE — Assessment & Plan Note (Signed)
Summary: bp elevated/dizziness/njr   Vital Signs:  Patient profile:   77 year old male Weight:      221 pounds O2 Sat:      97 % Temp:     97.7 degrees F oral Pulse rate:   70 / minute Pulse rhythm:   regular BP sitting:   150 / 80  (right arm) Cuff size:   regular  Vitals Entered By: Duard Brady LPN (August 19, 2009 9:53 AM) CC: c/o elevated BP this am . c/o unsteady gait , dizziness Is Patient Diabetic? No   CC:  c/o elevated BP this am . c/o unsteady gait  and dizziness.  History of Present Illness: 77 year old patient who is in today for follow-up.  During the night he developed some mild vertigo, which is aggravated by his poor eyesight.  He has had recurrent mild vertigo in the past.  He is noted by family to be slightly unsteady due to dizziness and also with mild elevated blood pressure, especially systolic readings.  Presently, he is back to baseline, but was concerned about his high systolic blood pressure  Allergies: 1)  ! Pletal  Past History:  Past Medical History: Reviewed history from 09/15/2008 and no changes required. Coronary artery disease Hypertension Colonic polyps, hx of paroxysmal atrial flutter 2002, 07-2008 history of sleep apnea BP H. remote peptic ulcer disease low back pain 5.4-cm abdominal aortic aneurysm (08-2008)  Review of Systems       The patient complains of vision loss and difficulty walking.  The patient denies anorexia, fever, weight loss, weight gain, decreased hearing, hoarseness, chest pain, syncope, dyspnea on exertion, peripheral edema, prolonged cough, headaches, hemoptysis, abdominal pain, melena, hematochezia, severe indigestion/heartburn, hematuria, incontinence, genital sores, muscle weakness, suspicious skin lesions, transient blindness, depression, unusual weight change, abnormal bleeding, enlarged lymph nodes, angioedema, breast masses, and testicular masses.    Physical Exam  General:  overweight-appearing.  alert  in no distress.  Blood pressure 140/80 Head:  Normocephalic and atraumatic without obvious abnormalities. No apparent alopecia or balding. Eyes:  No corneal or conjunctival inflammation noted. EOMI. Perrla. Funduscopic exam benign, without hemorrhages, exudates or papilledema. Vision grossly normal. Mouth:  Oral mucosa and oropharynx without lesions or exudates.  Teeth in good repair. Neck:  No deformities, masses, or tenderness noted. Lungs:  Normal respiratory effort, chest expands symmetrically. Lungs are clear to auscultation, no crackles or wheezes. Heart:  Normal rate and regular rhythm. S1 and S2 normal without gallop, murmur, click, rub or other extra sounds. Abdomen:  Bowel sounds positive,abdomen soft and non-tender without masses, organomegaly or hernias noted. Msk:  No deformity or scoliosis noted of thoracic or lumbar spine.     Impression & Recommendations:  Problem # 1:  VERTIGO (ICD-780.4)  Problem # 2:  HYPERTENSION (ICD-401.9)  His updated medication list for this problem includes:    Cardizem Cd 180 Mg Xr24h-cap (Diltiazem hcl coated beads) ..... One daily  Complete Medication List: 1)  Multivitamin  .... Take 1 tablet by mouth once a day 2)  Crestor 10 Mg Tabs (Rosuvastatin calcium) .... Take 1 tablet by mouth once a day 3)  Coumadin 2.5 Mg Tabs (warfarin Sodium)  .... One daily 5 times per week and  2 daily each monday and thursday 4)  Aspir-low 81 Mg Tbec (Aspirin) .Marland Kitchen.. 1 once daily 5)  Cardizem Cd 180 Mg Xr24h-cap (Diltiazem hcl coated beads) .... One daily  Patient Instructions: 1)  Please schedule a follow-up appointment as needed. 2)  return as scheduled for general follow-up 3)  Check your Blood Pressure regularly. If it is above:  150/90 you should make an appointment.

## 2010-03-23 NOTE — Assessment & Plan Note (Signed)
Summary: pt/njr ok per doc/njr  Nurse Visit   ANTICOAGULATION RECORD PREVIOUS REGIMEN & LAB RESULTS Anticoagulation Diagnosis:  427.31<V58.61,V58.83 on  09/12/2006 Previous INR Goal Range:  2.0-3.0 on  07/31/2007 Previous INR:  2.4 on  10/02/2009 Previous Coumadin Dose(mg):  5mg  only on thu. then 2.5mg  other days on  05/29/2009 Previous Regimen:  same on  10/02/2009 Previous Coagulation Comments:  Dr.K said it was ok to not send to [facility]. on  04/24/2009  NEW REGIMEN & LAB RESULTS Current INR: 2.7 Regimen: same  Repeat testing in: 4 weeks  Anticoagulation Visit Questionnaire Coumadin dose missed/changed:  No Abnormal Bleeding Symptoms:  No  Any diet changes including alcohol intake, vegetables or greens since the last visit:  No Any illnesses or hospitalizations since the last visit:  No Any signs of clotting since the last visit (including chest discomfort, dizziness, shortness of breath, arm tingling, slurred speech, swelling or redness in leg):  No  MEDICATIONS * MULTIVITAMIN Take 1 tablet by mouth once a day CRESTOR 10 MG  TABS (ROSUVASTATIN CALCIUM) Take 1 tablet by mouth once a day * COUMADIN 2.5 MG TABS (WARFARIN SODIUM) one daily 5 times per week and  2 daily each Monday and Thursday ASPIR-LOW 81 MG TBEC (ASPIRIN) 1 once daily CARDIZEM CD 180 MG XR24H-CAP (DILTIAZEM HCL COATED BEADS) one daily    Laboratory Results   Blood Tests      INR: 2.7   (Normal Range: 0.88-1.12   Therap INR: 2.0-3.5) Comments: [name]  October 23, 2009 11:30 AM

## 2010-03-23 NOTE — Assessment & Plan Note (Signed)
Summary: pt/njr  Nurse Visit   Allergies: 1)  ! Pletal Laboratory Results   Blood Tests      INR: 2.4   (Normal Range: 0.88-1.12   Therap INR: 2.0-3.5) Comments: [name]  October 02, 2009 10:42 AM     Orders Added: 1)  Est. Patient Level I [99211] 2)  Protime [16109UE]   ANTICOAGULATION RECORD PREVIOUS REGIMEN & LAB RESULTS Anticoagulation Diagnosis:  427.31<V58.61,V58.83 on  09/12/2006 Previous INR Goal Range:  2.0-3.0 on  07/31/2007 Previous INR:  2.6 on  08/28/2009 Previous Coumadin Dose(mg):  5mg  only on thu. then 2.5mg  other days on  05/29/2009 Previous Regimen:  same on  08/28/2009 Previous Coagulation Comments:  Dr.K said it was ok to not send to [facility]. on  04/24/2009  NEW REGIMEN & LAB RESULTS Current INR: 2.4 Regimen: same  Repeat testing in: weeks  Anticoagulation Visit Questionnaire Coumadin dose missed/changed:  No Abnormal Bleeding Symptoms:  No  Any diet changes including alcohol intake, vegetables or greens since the last visit:  No Any illnesses or hospitalizations since the last visit:  No Any signs of clotting since the last visit (including chest discomfort, dizziness, shortness of breath, arm tingling, slurred speech, swelling or redness in leg):  No  MEDICATIONS * MULTIVITAMIN Take 1 tablet by mouth once a day CRESTOR 10 MG  TABS (ROSUVASTATIN CALCIUM) Take 1 tablet by mouth once a day * COUMADIN 2.5 MG TABS (WARFARIN SODIUM) one daily 5 times per week and  2 daily each Monday and Thursday ASPIR-LOW 81 MG TBEC (ASPIRIN) 1 once daily CARDIZEM CD 180 MG XR24H-CAP (DILTIAZEM HCL COATED BEADS) one daily

## 2010-03-23 NOTE — Assessment & Plan Note (Signed)
Summary: 3 MONTH ROV/NJR daughter rsc/njr/pt rsc/cjr   Vital Signs:  Patient profile:   77 year old male Weight:      214 pounds Temp:     98.1 degrees F oral BP sitting:   110 / 70  (left arm) Cuff size:   large  Vitals Entered By: Duard Brady LPN (April 24, 1608 11:25 AM) CC: rov - doing ok Is Patient Diabetic? No   CC:  rov - doing ok.  History of Present Illness: 77 year-old patient who is seen today for follow-uparea and he has chronic atrial for malacia and hypertension, coronary artery disease, and remains on chronic Coumadin.  He has treated dyslipidemia, on Crestor 10 mg daily.  He has done quite well and has been titrated off blood pressure medication except for Cardizem on her 80 mg daily.  He denies any cardiopulmonary complaints.  He feels well today.  Allergies: 1)  ! Pletal  Review of Systems  The patient denies anorexia, fever, weight loss, weight gain, vision loss, decreased hearing, hoarseness, chest pain, syncope, dyspnea on exertion, peripheral edema, prolonged cough, headaches, hemoptysis, abdominal pain, melena, hematochezia, severe indigestion/heartburn, hematuria, incontinence, genital sores, muscle weakness, suspicious skin lesions, transient blindness, difficulty walking, depression, unusual weight change, abnormal bleeding, enlarged lymph nodes, angioedema, breast masses, and testicular masses.    Physical Exam  General:  overweight-appearing.  blood pressure 120/60 Head:  Normocephalic and atraumatic without obvious abnormalities. No apparent alopecia or balding. Mouth:  Oral mucosa and oropharynx without lesions or exudates.  Teeth in good repair. Neck:  No deformities, masses, or tenderness noted. Lungs:  Normal respiratory effort, chest expands symmetrically. Lungs are clear to auscultation, no crackles or wheezes. Heart:  Normal rate and regular rhythm. S1 and S2 normal without gallop, murmur, click, rub or other extra sounds. Abdomen:  Bowel  sounds positive,abdomen soft and non-tender without masses, organomegaly or hernias noted. Msk:  No deformity or scoliosis noted of thoracic or lumbar spine.   Pulses:  R and L carotid,radial,femoral,dorsalis pedis and posterior tibial pulses are full and equal bilaterally Extremities:  No clubbing, cyanosis, edema, or deformity noted with normal full range of motion of all joints.     Impression & Recommendations:  Problem # 1:  ATRIAL FIBRILLATION (ICD-427.31)  His updated medication list for this problem includes:    Aspir-low 81 Mg Tbec (Aspirin) .Marland Kitchen... 1 once daily    Cardizem Cd 180 Mg Xr24h-cap (Diltiazem hcl coated beads) ..... One daily  Orders: Protime (96045WU)  Problem # 2:  COUMADIN THERAPY (ICD-V58.61)  Orders: Protime ([id number])  Problem # 3:  HYPERTENSION (ICD-401.9)  His updated medication list for this problem includes:    Cardizem Cd 180 Mg Xr24h-cap (Diltiazem hcl coated beads) ..... One daily  Complete Medication List: 1)  Multivitamin  .... Take 1 tablet by mouth once a day 2)  Crestor 10 Mg Tabs (Rosuvastatin calcium) .... Take 1 tablet by mouth once a day 3)  Coumadin 2.5 Mg Tabs (warfarin Sodium)  .... One daily 5 times per week and  2 daily each monday and thursday 4)  Aspir-low 81 Mg Tbec (Aspirin) .Marland Kitchen.. 1 once daily 5)  Cardizem Cd 180 Mg Xr24h-cap (Diltiazem hcl coated beads) .... One daily  Patient Instructions: 1)  Limit your Sodium (Salt). 2)  It is important that you exercise regularly at least 20 minutes 5 times a week. If you develop chest pain, have severe difficulty breathing, or feel very tired , stop exercising immediately  and seek medical attention. 3)  You need to lose weight. Consider a lower calorie diet and regular exercise.  4)  Please schedule a follow-up appointment in 3 months.  Appended Document: 3 MONTH ROV/NJR daughter rsc/njr/pt rsc/cjr  Laboratory Results   Blood Tests     PT: 27.2 s   (Normal Range: 10.6-13.4)  INR:  5.0   (Normal Range: 0.88-1.12   Therap INR: 2.0-3.5) Comments: [name]  April 24, 2009 1:08 PM       ANTICOAGULATION RECORD PREVIOUS REGIMEN & LAB RESULTS Anticoagulation Diagnosis:  427.31<V58.61,V58.83 on  09/12/2006 Previous INR Goal Range:  2.0-3.0 on  07/31/2007 Previous INR:  15.0 on  04/03/2009 Previous Coumadin Dose(mg):  2.5MG  X5DAYS/1.25 ON M,TH on  08/28/2007 Previous Regimen:  same on  04/03/2009 Previous Coagulation Comments:  OV on  05/02/2007  NEW REGIMEN & LAB RESULTS Current INR: 5.0 Regimen: hold 3 days then 2.5mg  QD Coagulation Comments: Dr.K said it was ok to not send to [facility]. Repeat testing in: 3 weeks  Anticoagulation Visit Questionnaire Coumadin dose missed/changed:  No Abnormal Bleeding Symptoms:  No  Any diet changes including alcohol intake, vegetables or greens since the last visit:  No Any illnesses or hospitalizations since the last visit:  No Any signs of clotting since the last visit (including chest discomfort, dizziness, shortness of breath, arm tingling, slurred speech, swelling or redness in leg):  No  MEDICATIONS * MULTIVITAMIN Take 1 tablet by mouth once a day CRESTOR 10 MG  TABS (ROSUVASTATIN CALCIUM) Take 1 tablet by mouth once a day * COUMADIN 2.5 MG TABS (WARFARIN SODIUM) one daily 5 times per week and  2 daily each Monday and Thursday ASPIR-LOW 81 MG TBEC (ASPIRIN) 1 once daily CARDIZEM CD 180 MG XR24H-CAP (DILTIAZEM HCL COATED BEADS) one daily

## 2010-03-23 NOTE — Assessment & Plan Note (Signed)
Summary: PT LABS//CCM  Nurse Visit   Allergies: 1)  ! Pletal Laboratory Results   Blood Tests      INR: 2.7   (Normal Range: 0.88-1.12   Therap INR: 2.0-3.5) Comments: Corey Mcdonald  January 22, 2010 10:20 AM     Orders Added: 1)  Est. Patient Level I [99211] 2)  Protime [[id number]]   ANTICOAGULATION RECORD PREVIOUS REGIMEN & LAB RESULTS Anticoagulation Diagnosis:  427.31<V58.61,V58.83 on  09/12/2006 Previous INR Goal Range:  2.0-3.0 on  07/31/2007 Previous INR:  2.7 on  12/25/2009 Previous Coumadin Dose(mg):  5mg  only on thu. then 2.5mg  other days on  05/29/2009 Previous Regimen:  same on  12/04/2009 Previous Coagulation Comments:  Dr.K said it was ok to not send to [facility]. on  04/24/2009  NEW REGIMEN & LAB RESULTS Current INR: 2.7 Regimen: same  Repeat testing in: 4 weeks  Anticoagulation Visit Questionnaire Coumadin dose missed/changed:  No Abnormal Bleeding Symptoms:  No  Any diet changes including alcohol intake, vegetables or greens since the last visit:  No Any illnesses or hospitalizations since the last visit:  No Any signs of clotting since the last visit (including chest discomfort, dizziness, shortness of breath, arm tingling, slurred speech, swelling or redness in leg):  No  MEDICATIONS * MULTIVITAMIN Take 1 tablet by mouth once a day CRESTOR 10 MG  TABS (ROSUVASTATIN CALCIUM) Take 1 tablet by mouth once a day * COUMADIN 2.5 MG TABS (WARFARIN SODIUM) one daily 5 times per week and  2 daily each Monday and Thursday ASPIR-LOW 81 MG TBEC (ASPIRIN) 1 once daily CARBIDOPA-LEVODOPA CR 25-100 MG CR-TABS (CARBIDOPA-LEVODOPA) 1/2 tablet at bedtime DILTIAZEM HCL COATED BEADS 180 MG XR24H-CAP (DILTIAZEM HCL COATED BEADS) one daily

## 2010-03-23 NOTE — Progress Notes (Signed)
Summary: crestor samples please  Phone Note Call from Patient Call back at [phone]   Caller: Daughter-Corey Mcdonald Call For: Corey Savers  MD Summary of Call: Pt needs samples of crestor 10 mg Initial call taken by: Heron Sabins,  January 29, 2010 8:36 AM  Follow-up for Phone Call        okay if available Follow-up by: Corey Savers  MD,  January 29, 2010 4:04 PM  Additional Follow-up for Phone Call Additional follow up Details #1::        daugh is aware no samples avail Additional Follow-up by: Heron Sabins,  January 29, 2010 4:10 PM

## 2010-03-23 NOTE — Assessment & Plan Note (Signed)
Summary: 3 MNTH ROV//SLM/PT/RESCH/RCD   Vital Signs:  Patient profile:   77 year old male Weight:      216 pounds Temp:     99.0 degrees F BP sitting:   120 / 70  (right arm) Cuff size:   large  Vitals Entered By: Duard Brady LPN (April 03, 2009 11:26 AM) CC: 3 mos rov , c/o cough, comgestion, body aches   CC:  3 mos rov , c/o cough, comgestion, and body aches.  History of Present Illness: 77 year old patient who is seen today with a 3 day history of cold symptoms.  He has had head and chest congestion and nonproductive cough.  He complains of some myalgias.  Denies any fever, chills, or purulent sputum production.  No chest pain or shortness of breath.  He has a history of chronic atrial correlation on chronic Coumadin and a history of coronary artery disease  Allergies: 1)  ! Pletal  Past History:  Past Medical History: Reviewed history from 09/15/2008 and no changes required. Coronary artery disease Hypertension Colonic polyps, hx of paroxysmal atrial flutter 2002, 07-2008 history of sleep apnea BP H. remote peptic ulcer disease low back pain 5.4-cm abdominal aortic aneurysm (08-2008)  Past Surgical History: Reviewed history from 08/18/2006 and no changes required. Coronary artery bypass graft-1997Cardiolite Stress Test-02/2005 Colonoscopy-2002  Review of Systems       The patient complains of anorexia, hoarseness, and prolonged cough.  The patient denies fever, weight loss, weight gain, vision loss, decreased hearing, chest pain, syncope, dyspnea on exertion, peripheral edema, headaches, hemoptysis, abdominal pain, melena, hematochezia, severe indigestion/heartburn, hematuria, incontinence, genital sores, muscle weakness, suspicious skin lesions, transient blindness, difficulty walking, depression, unusual weight change, abnormal bleeding, enlarged lymph nodes, angioedema, breast masses, and testicular masses.    Physical Exam  General:   overweight-appearing.  appears unwell, but in no acute distress.  Blood pressure stable Head:  Normocephalic and atraumatic without obvious abnormalities. No apparent alopecia or balding. Eyes:  No corneal or conjunctival inflammation noted. EOMI. Perrla. Funduscopic exam benign, without hemorrhages, exudates or papilledema. Vision grossly normal. Ears:  External ear exam shows no significant lesions or deformities.  Otoscopic examination reveals clear canals, tympanic membranes are intact bilaterally without bulging, retraction, inflammation or discharge. Hearing is grossly normal bilaterally. Mouth:  Oral mucosa and oropharynx without lesions or exudates.  Teeth in good repair. Neck:  No deformities, masses, or tenderness noted. Chest Wall:  No deformities, masses, tenderness or gynecomastia noted. Lungs:  Normal respiratory effort, chest expands symmetrically. Lungs are clear to auscultation, no crackles or wheezes. Heart:  Normal rate and regular rhythm. S1 and S2 normal without gallop, murmur, click, rub or other extra sounds. Abdomen:  Bowel sounds positive,abdomen soft and non-tender without masses, organomegaly or hernias noted. Msk:  No deformity or scoliosis noted of thoracic or lumbar spine.   Pulses:  R and L carotid,radial,femoral,dorsalis pedis and posterior tibial pulses are full and equal bilaterally   Impression & Recommendations:  Problem # 1:  URI (ICD-465.9)  His updated medication list for this problem includes:    Aspir-low 81 Mg Tbec (Aspirin) .Marland Kitchen... 1 once daily  Problem # 2:  WEAKNESS (ICD-780.79)  Problem # 3:  ATRIAL FIBRILLATION (ICD-427.31)  His updated medication list for this problem includes:    Aspir-low 81 Mg Tbec (Aspirin) .Marland Kitchen... 1 once daily    Cardizem Cd 180 Mg Xr24h-cap (Diltiazem hcl coated beads) ..... One daily  Orders: Protime ([id number]) Fingerstick ([id number])  Complete Medication List:  1)  Multivitamin  .... Take 1 tablet by mouth once a day 2)   Crestor 10 Mg Tabs (Rosuvastatin calcium) .... Take 1 tablet by mouth once a day 3)  Coumadin 2.5 Mg Tabs (warfarin Sodium)  .... One daily 5 times per week and  2 daily each monday and thursday 4)  Aspir-low 81 Mg Tbec (Aspirin) .Marland Kitchen.. 1 once daily 5)  Cardizem Cd 180 Mg Xr24h-cap (Diltiazem hcl coated beads) .... One daily  Patient Instructions: 1)  Get plenty of rest, drink lots of clear liquids, and use Tylenol or Ibuprofen for fever and comfort. Return in 7-10 days if you're not better:sooner if you're feeling worse.  Laboratory Results   Blood Tests     PT: 1.5 s   (Normal Range: 10.6-13.4)  INR: 15.0   (Normal Range: 0.88-1.12   Therap INR: 2.0-3.5)      ANTICOAGULATION RECORD PREVIOUS REGIMEN & LAB RESULTS Anticoagulation Diagnosis:  427.31<V58.61,V58.83 on  09/12/2006 Previous INR Goal Range:  2.0-3.0 on  07/31/2007 Previous INR:  2.5 on  02/27/2009 Previous Coumadin Dose(mg):  2.5MG  X5DAYS/1.25 ON M,TH on  08/28/2007 Previous Regimen:  Dont take x2 days, then take 2.5mg  everyday except Mondays take 5mg  and RTO 3wks. on  11/13/2008 Previous Coagulation Comments:  OV on  05/02/2007  NEW REGIMEN & LAB RESULTS Current INR: 15.0 Regimen: same  Repeat testing in: 1 month  Anticoagulation Visit Questionnaire Coumadin dose missed/changed:  No Abnormal Bleeding Symptoms:  No  Any diet changes including alcohol intake, vegetables or greens since the last visit:  No Any illnesses or hospitalizations since the last visit:  No Any signs of clotting since the last visit (including chest discomfort, dizziness, shortness of breath, arm tingling, slurred speech, swelling or redness in leg):  Yes  MEDICATIONS * MULTIVITAMIN Take 1 tablet by mouth once a day CRESTOR 10 MG  TABS (ROSUVASTATIN CALCIUM) Take 1 tablet by mouth once a day * COUMADIN 2.5 MG TABS (WARFARIN SODIUM) one daily 5 times per week and  2 daily each Monday and Thursday ASPIR-LOW 81 MG TBEC (ASPIRIN) 1 once  daily CARDIZEM CD 180 MG XR24H-CAP (DILTIAZEM HCL COATED BEADS) one daily

## 2010-03-23 NOTE — Assessment & Plan Note (Signed)
Summary: pt/njr  Nurse Visit   Allergies: 1)  ! Pletal Laboratory Results   Blood Tests      INR: 2.6   (Normal Range: 0.88-1.12   Therap INR: 2.0-3.5) Comments: [name]  August 28, 2009 9:59 AM     Orders Added: 1)  Est. Patient Level I [99211] 2)  Protime [16109UE]    ANTICOAGULATION RECORD PREVIOUS REGIMEN & LAB RESULTS Anticoagulation Diagnosis:  427.31<V58.61,V58.83 on  09/12/2006 Previous INR Goal Range:  2.0-3.0 on  07/31/2007 Previous INR:  2.9 on  07/24/2009 Previous Coumadin Dose(mg):  5mg  only on thu. then 2.5mg  other days on  05/29/2009 Previous Regimen:  same on  07/24/2009 Previous Coagulation Comments:  Dr.K said it was ok to not send to [facility]. on  04/24/2009  NEW REGIMEN & LAB RESULTS Current INR: 2.6 Regimen: same  Repeat testing in: 4 weeks  Anticoagulation Visit Questionnaire Coumadin dose missed/changed:  No Abnormal Bleeding Symptoms:  No  Any diet changes including alcohol intake, vegetables or greens since the last visit:  No Any illnesses or hospitalizations since the last visit:  No Any signs of clotting since the last visit (including chest discomfort, dizziness, shortness of breath, arm tingling, slurred speech, swelling or redness in leg):  No  MEDICATIONS * MULTIVITAMIN Take 1 tablet by mouth once a day CRESTOR 10 MG  TABS (ROSUVASTATIN CALCIUM) Take 1 tablet by mouth once a day * COUMADIN 2.5 MG TABS (WARFARIN SODIUM) one daily 5 times per week and  2 daily each Monday and Thursday ASPIR-LOW 81 MG TBEC (ASPIRIN) 1 once daily CARDIZEM CD 180 MG XR24H-CAP (DILTIAZEM HCL COATED BEADS) one daily

## 2010-03-23 NOTE — Assessment & Plan Note (Signed)
Summary: pt/cjr  Nurse Visit   Allergies: 1)  ! Pletal Laboratory Results   Blood Tests   Date/Time Received: December 25, 2009 12:04 PM  Date/Time Reported: December 25, 2009 12:04 PM    INR: 2.7   (Normal Range: 0.88-1.12   Therap INR: 2.0-3.5) Comments: Wynona Canes, CMA  December 25, 2009 12:04 PM     Orders Added: 1)  Est. Patient Level I [99211] 2)  Protime [[id number]]  Laboratory Results   Blood Tests      INR: 2.7   (Normal Range: 0.88-1.12   Therap INR: 2.0-3.5) Comments: Wynona Canes, CMA  December 25, 2009 12:04 PM       ANTICOAGULATION RECORD PREVIOUS REGIMEN & LAB RESULTS Anticoagulation Diagnosis:  427.31<V58.61,V58.83 on  09/12/2006 Previous INR Goal Range:  2.0-3.0 on  07/31/2007 Previous INR:  3.0 on  12/04/2009 Previous Coumadin Dose(mg):  5mg  only on thu. then 2.5mg  other days on  05/29/2009 Previous Regimen:  same on  12/04/2009 Previous Coagulation Comments:  Dr.K said it was ok to not send to [facility]. on  04/24/2009  NEW REGIMEN & LAB RESULTS Current INR: 2.7 Regimen: same  (no change)       Repeat testing in: 4 weeks MEDICATIONS * MULTIVITAMIN Take 1 tablet by mouth once a day CRESTOR 10 MG  TABS (ROSUVASTATIN CALCIUM) Take 1 tablet by mouth once a day * COUMADIN 2.5 MG TABS (WARFARIN SODIUM) one daily 5 times per week and  2 daily each Monday and Thursday ASPIR-LOW 81 MG TBEC (ASPIRIN) 1 once daily CARBIDOPA-LEVODOPA CR 25-100 MG CR-TABS (CARBIDOPA-LEVODOPA) 1/2 tablet at bedtime DILTIAZEM HCL COATED BEADS 180 MG XR24H-CAP (DILTIAZEM HCL COATED BEADS) one daily   Anticoagulation Visit Questionnaire      Coumadin dose missed/changed:  No      Abnormal Bleeding Symptoms:  No   Any diet changes including alcohol intake, vegetables or greens since the last visit:  No Any illnesses or hospitalizations since the last visit:  No Any signs of clotting since the last visit (including chest discomfort, dizziness, shortness of  breath, arm tingling, slurred speech, swelling or redness in leg):  No

## 2010-03-23 NOTE — Assessment & Plan Note (Signed)
Summary: protime labs//ccm/DAUGHTER RESCD//CCM  Nurse Visit   Allergies: 1)  ! Pletal Laboratory Results   Blood Tests   Date/Time Received: May 14, 2009 1:02 PM  Date/Time Reported: May 14, 2009 1:02 PM   PT: 15.3 s   (Normal Range: 10.6-13.4)  INR: 1.6   (Normal Range: 0.88-1.12   Therap INR: 2.0-3.5) Comments: Wynona Canes, CMA  May 14, 2009 1:02 PM     Orders Added: 1)  Est. Patient Level I [99211] 2)  Protime [16109UE]  Laboratory Results   Blood Tests     PT: 15.3 s   (Normal Range: 10.6-13.4)  INR: 1.6   (Normal Range: 0.88-1.12   Therap INR: 2.0-3.5) Comments: Wynona Canes, CMA  May 14, 2009 1:02 PM       ANTICOAGULATION RECORD PREVIOUS REGIMEN & LAB RESULTS Anticoagulation Diagnosis:  427.31<V58.61,V58.83 on  09/12/2006 Previous INR Goal Range:  2.0-3.0 on  07/31/2007 Previous INR:  5.0 on  04/24/2009 Previous Coumadin Dose(mg):  2.5MG  X5DAYS/1.25 ON M,TH on  08/28/2007 Previous Regimen:  hold 3 days then 2.5mg  QD on  04/24/2009 Previous Coagulation Comments:  Dr.K said it was ok to not send to [facility]. on  04/24/2009  NEW REGIMEN & LAB RESULTS Current INR: 1.6 Current Coumadin Dose(mg): 2.5mg  qd Regimen: 5mg  on thu. only then 2.5mg  x 6 days       Repeat testing in: 4 weeks MEDICATIONS * MULTIVITAMIN Take 1 tablet by mouth once a day CRESTOR 10 MG  TABS (ROSUVASTATIN CALCIUM) Take 1 tablet by mouth once a day * COUMADIN 2.5 MG TABS (WARFARIN SODIUM) one daily 5 times per week and  2 daily each Monday and Thursday ASPIR-LOW 81 MG TBEC (ASPIRIN) 1 once daily CARDIZEM CD 180 MG XR24H-CAP (DILTIAZEM HCL COATED BEADS) one daily   Anticoagulation Visit Questionnaire      Coumadin dose missed/changed:  No      Abnormal Bleeding Symptoms:  No   Any diet changes including alcohol intake, vegetables or greens since the last visit:  No Any illnesses or hospitalizations since the last visit:  No Any signs of clotting since the last visit  (including chest discomfort, dizziness, shortness of breath, arm tingling, slurred speech, swelling or redness in leg):  No

## 2010-03-23 NOTE — Assessment & Plan Note (Signed)
Summary: 4 month rov/njr/pt rsc from bmp/cjr   Vital Signs:  Patient profile:   77 year old male Weight:      222 pounds Temp:     98.1 degrees F oral BP sitting:   128 / 70  (right arm) Cuff size:   regular  Vitals Entered By: Duard Brady LPN (December 04, 2009 3:20 PM) CC: 4 mos rov - doing well  Is Patient Diabetic? No   CC:  4 mos rov - doing well .  History of Present Illness: 76 year old patient who has a history of paroxysmal atrial fibrillation, on chronic anticoagulation therapy.  He has dyslipidemia.  He has done quite well over the past few months, but did have an episode consistent with atrial fib with rapid ventricular response.  This resolved spontaneously.  Today he feels well. He describes restless legs.  It interferes with his sleep.  This occurs only when he retires for the evening, but does not bother him during the day.  Sleep is interfered with polyuria and part due to large consumption of fluids  Allergies: 1)  ! Pletal  Past History:  Past Medical History: Coronary artery disease Hypertension Colonic polyps, hx of paroxysmal atrial flutter 2002, 07-2008 history of sleep apnea BP H. remote peptic ulcer disease low back pain 5.4-cm abdominal aortic aneurysm (08-2008) Hyperlipidemia  Review of Systems       The patient complains of weight gain.  The patient denies anorexia, fever, weight loss, vision loss, decreased hearing, hoarseness, chest pain, syncope, dyspnea on exertion, peripheral edema, prolonged cough, headaches, hemoptysis, abdominal pain, melena, hematochezia, severe indigestion/heartburn, hematuria, incontinence, genital sores, muscle weakness, suspicious skin lesions, transient blindness, difficulty walking, depression, unusual weight change, abnormal bleeding, enlarged lymph nodes, angioedema, breast masses, and testicular masses.    Physical Exam  General:  overweight-appearing.  normal blood pressure Head:  Normocephalic and  atraumatic without obvious abnormalities. No apparent alopecia or balding. Eyes:  No corneal or conjunctival inflammation noted. EOMI. Perrla. Funduscopic exam benign, without hemorrhages, exudates or papilledema. Vision grossly normal. Mouth:  Oral mucosa and oropharynx without lesions or exudates.  Teeth in good repair. Neck:  No deformities, masses, or tenderness noted. Lungs:  Normal respiratory effort, chest expands symmetrically. Lungs are clear to auscultation, no crackles or wheezes. Heart:  Normal rate and regular rhythm. S1 and S2 normal without gallop, murmur, click, rub or other extra sounds.  rhythm is regular Abdomen:  Bowel sounds positive,abdomen soft and non-tender without masses, organomegaly or hernias noted. Msk:  No deformity or scoliosis noted of thoracic or lumbar spine.   Extremities:  1+ left pedal edema and 1+ right pedal edema.     Impression & Recommendations:  Problem # 1:  ATRIAL FIBRILLATION (ICD-427.31)  The following medications were removed from the medication list:    Cardizem Cd 180 Mg Xr24h-cap (Diltiazem hcl coated beads) ..... One daily His updated medication list for this problem includes:    Aspir-low 81 Mg Tbec (Aspirin) .Marland Kitchen... 1 once daily    Diltiazem Hcl Coated Beads 180 Mg Xr24h-cap (Diltiazem hcl coated beads) ..... One daily  Orders: Fingerstick (16109) Protime ([id number])  Problem # 2:  COUMADIN THERAPY (ICD-V58.61)  Orders: Fingerstick (81191) Protime ([id number])  Problem # 3:  RESTLESS LEGS SYNDROME (ICD-333.94) patient has possible restless leg syndrome and is requesting a therapeutic high.  Symptoms have been present for at least 3 months and quite bothersome to the patient ;  will give a try low flow dose Sinemet  at h.s.  Problem # 4:  HYPERLIPIDEMIA (ICD-272.4)  His updated medication list for this problem includes:    Crestor 10 Mg Tabs (Rosuvastatin calcium) .Marland Kitchen... Take 1 tablet by mouth once a day  Complete Medication  List: 1)  Multivitamin  .... Take 1 tablet by mouth once a day 2)  Crestor 10 Mg Tabs (Rosuvastatin calcium) .... Take 1 tablet by mouth once a day 3)  Coumadin 2.5 Mg Tabs (warfarin Sodium)  .... One daily 5 times per week and  2 daily each monday and thursday 4)  Aspir-low 81 Mg Tbec (Aspirin) .Marland Kitchen.. 1 once daily 5)  Carbidopa-levodopa Cr 25-100 Mg Cr-tabs (Carbidopa-levodopa) .... 1/2 tablet at bedtime 6)  Diltiazem Hcl Coated Beads 180 Mg Xr24h-cap (Diltiazem hcl coated beads) .... One daily  Patient Instructions: 1)  Limit your Sodium (Salt). 2)  It is important that you exercise regularly at least 20 minutes 5 times a week. If you develop chest pain, have severe difficulty breathing, or feel very tired , stop exercising immediately and seek medical attention. 3)  You need to lose weight. Consider a lower calorie diet and regular exercise.  4)  Please schedule a follow-up appointment in 4 months. Prescriptions: CARBIDOPA-LEVODOPA CR 25-100 MG CR-TABS (CARBIDOPA-LEVODOPA) 1/2 tablet at bedtime  #50 x 3   Entered and Authorized by:   Gordy Savers  MD   Signed by:   Gordy Savers  MD on 12/04/2009   Method used:   Print then Give to Patient   RxID:   [id number]    ANTICOAGULATION RECORD PREVIOUS REGIMEN & LAB RESULTS Anticoagulation Diagnosis:  427.31<V58.61,V58.83 on  09/12/2006 Previous INR Goal Range:  2.0-3.0 on  07/31/2007 Previous INR:  2.7 on  10/23/2009 Previous Coumadin Dose(mg):  5mg  only on thu. then 2.5mg  other days on  05/29/2009 Previous Regimen:  same on  10/23/2009 Previous Coagulation Comments:  Dr.K said it was ok to not send to [facility]. on  04/24/2009  NEW REGIMEN & LAB RESULTS Current INR: 3.0 Regimen: same  Repeat testing in: 4 weeks  Anticoagulation Visit Questionnaire Coumadin dose missed/changed:  No Abnormal Bleeding Symptoms:  No  Any diet changes including alcohol intake, vegetables or greens since the last visit:  No Any illnesses  or hospitalizations since the last visit:  No Any signs of clotting since the last visit (including chest discomfort, dizziness, shortness of breath, arm tingling, slurred speech, swelling or redness in leg):  No  MEDICATIONS * MULTIVITAMIN Take 1 tablet by mouth once a day CRESTOR 10 MG  TABS (ROSUVASTATIN CALCIUM) Take 1 tablet by mouth once a day * COUMADIN 2.5 MG TABS (WARFARIN SODIUM) one daily 5 times per week and  2 daily each Monday and Thursday ASPIR-LOW 81 MG TBEC (ASPIRIN) 1 once daily CARBIDOPA-LEVODOPA CR 25-100 MG CR-TABS (CARBIDOPA-LEVODOPA) 1/2 tablet at bedtime DILTIAZEM HCL COATED BEADS 180 MG XR24H-CAP (DILTIAZEM HCL COATED BEADS) one daily    Laboratory Results   Blood Tests      INR: 3.0   (Normal Range: 0.88-1.12   Therap INR: 2.0-3.5) Comments: [name]  December 04, 2009 3:23 PM

## 2010-03-23 NOTE — Assessment & Plan Note (Signed)
Summary: 3 month fup//ccm   Vital Signs:  Patient profile:   77 year old male Weight:      221 pounds Temp:     97.9 degrees F oral BP sitting:   120 / 70  (right arm) Cuff size:   regular  Vitals Entered By: Duard Brady LPN (July 25, 4538 10:52 AM) CC: 3 mos rov - doing well    colo 08/08/09 Is Patient Diabetic? No   CC:  3 mos rov - doing well    colo 08/08/09.  History of Present Illness: 77 year old patient who is seen today for follow-up.  He has a history of hypertension, coronary artery disease, atrial correlation.  He is on chronic Coumadin therapy.  He has treated hypertension on diltiazem 180 mg daily.  No concerns or complaints today.  He has had a progressive weight gain.  Dietary issues were discussed  Allergies: 1)  ! Pletal  Past History:  Past Medical History: Reviewed history from 09/15/2008 and no changes required. Coronary artery disease Hypertension Colonic polyps, hx of paroxysmal atrial flutter 2002, 07-2008 history of sleep apnea BP H. remote peptic ulcer disease low back pain 5.4-cm abdominal aortic aneurysm (08-2008)  Past Surgical History: Reviewed history from 08/18/2006 and no changes required. Coronary artery bypass graft-1997Cardiolite Stress Test-02/2005 Colonoscopy-2002  Review of Systems       The patient complains of weight gain.  The patient denies anorexia, fever, weight loss, vision loss, decreased hearing, hoarseness, chest pain, syncope, dyspnea on exertion, peripheral edema, prolonged cough, headaches, hemoptysis, abdominal pain, melena, hematochezia, severe indigestion/heartburn, hematuria, incontinence, genital sores, muscle weakness, suspicious skin lesions, transient blindness, difficulty walking, depression, unusual weight change, abnormal bleeding, enlarged lymph nodes, angioedema, breast masses, and testicular masses.    Physical Exam  General:  overweight-appearing.  on 20/70 Head:  Normocephalic and atraumatic  without obvious abnormalities. No apparent alopecia or balding. Mouth:  Oral mucosa and oropharynx without lesions or exudates.  Teeth in good repair. Neck:  No deformities, masses, or tenderness noted. Lungs:  Normal respiratory effort, chest expands symmetrically. Lungs are clear to auscultation, no crackles or wheezes. Heart:  Normal rate and regular rhythm. S1 and S2 normal without gallop, murmur, click, rub or other extra sounds.  rhythm is regular Abdomen:  Bowel sounds positive,abdomen soft and non-tender without masses, organomegaly or hernias noted. Msk:  No deformity or scoliosis noted of thoracic or lumbar spine.   Extremities:  No clubbing, cyanosis, edema, or deformity noted with normal full range of motion of all joints.     Impression & Recommendations:  Problem # 1:  ATRIAL FIBRILLATION (ICD-427.31)  His updated medication list for this problem includes:    Aspir-low 81 Mg Tbec (Aspirin) .Marland Kitchen... 1 once daily    Cardizem Cd 180 Mg Xr24h-cap (Diltiazem hcl coated beads) ..... One daily    His updated medication list for this problem includes:    Aspir-low 81 Mg Tbec (Aspirin) .Marland Kitchen... 1 once daily    Cardizem Cd 180 Mg Xr24h-cap (Diltiazem hcl coated beads) ..... One daily  Orders: Protime ([id number])  Problem # 2:  COUMADIN THERAPY (ICD-V58.61)  Orders: Protime ([id number]) Prescription Created Electronically [phone]) Fingerstick ([phone])  Complete Medication List: 1)  Multivitamin  .... Take 1 tablet by mouth once a day 2)  Crestor 10 Mg Tabs (Rosuvastatin calcium) .... Take 1 tablet by mouth once a day 3)  Coumadin 2.5 Mg Tabs (warfarin Sodium)  .... One daily 5 times per week and  2  daily each monday and thursday 4)  Aspir-low 81 Mg Tbec (Aspirin) .Marland Kitchen.. 1 once daily 5)  Cardizem Cd 180 Mg Xr24h-cap (Diltiazem hcl coated beads) .... One daily  Patient Instructions: 1)  Please schedule a follow-up appointment in 4 months. 2)  Limit your Sodium (Salt). 3)  It is  important that you exercise regularly at least 20 minutes 5 times a week. If you develop chest pain, have severe difficulty breathing, or feel very tired , stop exercising immediately and seek medical attention. 4)  You need to lose weight. Consider a lower calorie diet and regular exercise.  Prescriptions: CARDIZEM CD 180 MG XR24H-CAP (DILTIAZEM HCL COATED BEADS) one daily  #90 x 6   Entered and Authorized by:   Gordy Savers  MD   Signed by:   Gordy Savers  MD on 07/24/2009   Method used:   Electronically to        [facility]  [phone]* (retail)       [address]       [place], [place]  [address]       Ph: [phone] or [phone]       Fax: [phone]   RxID:   [id number] CRESTOR 10 MG  TABS (ROSUVASTATIN CALCIUM) Take 1 tablet by mouth once a day  #90 x 6   Entered and Authorized by:   Gordy Savers  MD   Signed by:   Gordy Savers  MD on 07/24/2009   Method used:   Electronically to        [facility]  [phone]* (retail)       [address]       [place], [place]  [address]       Ph: [phone] or [phone]       Fax: [phone]   RxID:   [id number] CARDIZEM CD 180 MG XR24H-CAP (DILTIAZEM HCL COATED BEADS) one daily  #90 x 6   Entered and Authorized by:   Gordy Savers  MD   Signed by:   Gordy Savers  MD on 07/24/2009   Method used:   Electronically to        [facility]  [phone]* (retail)       [address]       [place], [place]  [address]       Ph: [phone] or [phone]       Fax: [phone]   RxID:   [id number] CRESTOR 10 MG  TABS (ROSUVASTATIN CALCIUM) Take 1 tablet by mouth once a day  #90 x 6   Entered and Authorized by:   Gordy Savers  MD   Signed by:   Gordy Savers  MD on 07/24/2009   Method used:   Electronically to        [facility]  [phone]* (retail)        [address]       [place], [place]  [address]       Ph: [phone] or [phone]       Fax: [phone]   RxID:   [id number]    ANTICOAGULATION RECORD PREVIOUS REGIMEN & LAB RESULTS Anticoagulation Diagnosis:  427.31<V58.61,V58.83 on  09/12/2006 Previous INR Goal Range:  2.0-3.0 on  07/31/2007 Previous INR:  2.1  on  06/26/2009 Previous Coumadin Dose(mg):  5mg  only on thu. then 2.5mg  other days on  05/29/2009 Previous Regimen:  same on  06/26/2009 Previous Coagulation Comments:  Dr.K said it was ok to not send to [facility]. on  04/24/2009  NEW REGIMEN & LAB RESULTS Current INR: 2.9 Regimen: same  Repeat testing in: 4 weeks  Anticoagulation Visit Questionnaire Coumadin dose missed/changed:  No Abnormal Bleeding Symptoms:  No  Any diet changes including alcohol intake, vegetables or greens since the last visit:  No Any illnesses or hospitalizations since the last visit:  No Any signs of clotting since the last visit (including chest discomfort, dizziness, shortness of breath, arm tingling, slurred speech, swelling or redness in leg):  No  MEDICATIONS * MULTIVITAMIN Take 1 tablet by mouth once a day CRESTOR 10 MG  TABS (ROSUVASTATIN CALCIUM) Take 1 tablet by mouth once a day * COUMADIN 2.5 MG TABS (WARFARIN SODIUM) one daily 5 times per week and  2 daily each Monday and Thursday ASPIR-LOW 81 MG TBEC (ASPIRIN) 1 once daily CARDIZEM CD 180 MG XR24H-CAP (DILTIAZEM HCL COATED BEADS) one daily    Laboratory Results   Blood Tests      INR: 2.9   (Normal Range: 0.88-1.12   Therap INR: 2.0-3.5) Comments: [name]  July 24, 2009 11:43 AM

## 2010-03-23 NOTE — Procedures (Signed)
Summary: Colonoscopy Report/[facility]  Colonoscopy Report/[facility]   Imported By: Maryln Gottron 08/19/2009 15:02:31  _____________________________________________________________________  External Attachment:    Type:   Image     Comment:   External Document

## 2010-03-23 NOTE — Assessment & Plan Note (Signed)
Summary: pt//ccm  Nurse Visit   Allergies: 1)  ! Pletal Laboratory Results   Blood Tests     PT: 17.8 s   (Normal Range: 10.6-13.4)  INR: 2.1   (Normal Range: 0.88-1.12   Therap INR: 2.0-3.5) Comments: [name]  Jun 26, 2009 8:15 AM     Orders Added: 1)  Est. Patient Level I [99211] 2)  Protime [16109UE]   ANTICOAGULATION RECORD PREVIOUS REGIMEN & LAB RESULTS Anticoagulation Diagnosis:  427.31<V58.61,V58.83 on  09/12/2006 Previous INR Goal Range:  2.0-3.0 on  07/31/2007 Previous INR:  1.9 on  05/29/2009 Previous Coumadin Dose(mg):  5mg  only on thu. then 2.5mg  other days on  05/29/2009 Previous Regimen:  same dose on  05/29/2009 Previous Coagulation Comments:  Dr.K said it was ok to not send to [facility]. on  04/24/2009  NEW REGIMEN & LAB RESULTS Current INR: 2.1 Regimen: same  Repeat testing in: 1 month  Anticoagulation Visit Questionnaire Coumadin dose missed/changed:  No Abnormal Bleeding Symptoms:  No  Any diet changes including alcohol intake, vegetables or greens since the last visit:  No Any illnesses or hospitalizations since the last visit:  No Any signs of clotting since the last visit (including chest discomfort, dizziness, shortness of breath, arm tingling, slurred speech, swelling or redness in leg):  No  MEDICATIONS * MULTIVITAMIN Take 1 tablet by mouth once a day CRESTOR 10 MG  TABS (ROSUVASTATIN CALCIUM) Take 1 tablet by mouth once a day * COUMADIN 2.5 MG TABS (WARFARIN SODIUM) one daily 5 times per week and  2 daily each Monday and Thursday ASPIR-LOW 81 MG TBEC (ASPIRIN) 1 once daily CARDIZEM CD 180 MG XR24H-CAP (DILTIAZEM HCL COATED BEADS) one daily

## 2010-03-23 NOTE — Assessment & Plan Note (Signed)
Summary: PT // RS  Nurse Visit   Allergies: 1)  ! Pletal Laboratory Results   Blood Tests   Date/Time Received: May 29, 2009 11:24 AM  Date/Time Reported: May 29, 2009 11:24 AM   PT: 17.0 s   (Normal Range: 10.6-13.4)  INR: 1.9   (Normal Range: 0.88-1.12   Therap INR: 2.0-3.5) Comments: Wynona Canes, CMA  May 29, 2009 11:24 AM     Orders Added: 1)  Est. Patient Level I [99211] 2)  Protime [[id number]]  Laboratory Results   Blood Tests     PT: 17.0 s   (Normal Range: 10.6-13.4)  INR: 1.9   (Normal Range: 0.88-1.12   Therap INR: 2.0-3.5) Comments: Wynona Canes, CMA  May 29, 2009 11:24 AM       ANTICOAGULATION RECORD PREVIOUS REGIMEN & LAB RESULTS Anticoagulation Diagnosis:  427.31<V58.61,V58.83 on  09/12/2006 Previous INR Goal Range:  2.0-3.0 on  07/31/2007 Previous INR:  1.6 on  05/14/2009 Previous Coumadin Dose(mg):  2.5mg  qd on  05/14/2009 Previous Regimen:  5mg  on thu. only then 2.5mg  x 6 days on  05/14/2009 Previous Coagulation Comments:  Dr.K said it was ok to not send to [facility]. on  04/24/2009  NEW REGIMEN & LAB RESULTS Current INR: 1.9 Current Coumadin Dose(mg): 5mg  only on thu. then 2.5mg  other days Regimen: same dose       Repeat testing in: 4 weeks MEDICATIONS * MULTIVITAMIN Take 1 tablet by mouth once a day CRESTOR 10 MG  TABS (ROSUVASTATIN CALCIUM) Take 1 tablet by mouth once a day * COUMADIN 2.5 MG TABS (WARFARIN SODIUM) one daily 5 times per week and  2 daily each Monday and Thursday ASPIR-LOW 81 MG TBEC (ASPIRIN) 1 once daily CARDIZEM CD 180 MG XR24H-CAP (DILTIAZEM HCL COATED BEADS) one daily   Anticoagulation Visit Questionnaire      Coumadin dose missed/changed:  No      Abnormal Bleeding Symptoms:  No   Any diet changes including alcohol intake, vegetables or greens since the last visit:  No Any illnesses or hospitalizations since the last visit:  No Any signs of clotting since the last visit (including chest  discomfort, dizziness, shortness of breath, arm tingling, slurred speech, swelling or redness in leg):  No

## 2010-03-23 NOTE — Progress Notes (Signed)
Summary: tongue bleeding stopped  Phone Note Call from Patient Call back at Home Phone [phone]   Caller: wife Call For: kwia Reason for Call: Talk to Doctor Summary of Call: [facility]. Call to patient.  Wife says that son-in-law there now & he has gotten the tongue to stop bleeding & they don't need anything now.  They will see Dr. Kirtland Bouchard as scheduled. Rudy Jew, RN  March 09, 2009 11:13 AM  Initial call taken by: Rudy Jew, RN,  March 09, 2009 10:07 AM  Follow-up for Phone Call        noted Follow-up by: Gordy Savers  MD,  March 09, 2009 12:55 PM  Additional Follow-up for Phone Call Additional follow up Details #1::        Phone Call Completed Additional Follow-up by: Rudy Jew, RN,  March 09, 2009 4:34 PM

## 2010-03-23 NOTE — Assessment & Plan Note (Signed)
Summary: PT/NJR  Nurse Visit   Allergies: 1)  ! Pletal Laboratory Results   Blood Tests     PT: 19.2 s   (Normal Range: 10.6-13.4)  INR: 2.5   (Normal Range: 0.88-1.12   Therap INR: 2.0-3.5) Comments: Joanne Chars CMA  February 27, 2009 10:54 AM     Orders Added: 1)  Est. Patient Level I [99211] 2)  Protime [16109UE]   ANTICOAGULATION RECORD PREVIOUS REGIMEN & LAB RESULTS Anticoagulation Diagnosis:  427.31<V58.61,V58.83 on  09/12/2006 Previous INR Goal Range:  2.0-3.0 on  07/31/2007 Previous INR:  1.6 on  02/12/2009 Previous Coumadin Dose(mg):  2.5MG  X5DAYS/1.25 ON M,TH on  08/28/2007 Previous Regimen:  Dont take x2 days, then take 2.5mg  everyday except Mondays take 5mg  and RTO 3wks. on  11/13/2008 Previous Coagulation Comments:  OV on  05/02/2007  NEW REGIMEN & LAB RESULTS Current INR: 2.5 Regimen: Dont take x2 days, then take 2.5mg  everyday except Mondays take 5mg  and RTO 3wks.  (no change)   Anticoagulation Visit Questionnaire Coumadin dose missed/changed:  Yes Coumadin Dose Comments:  one or more missed dose(s) Abnormal Bleeding Symptoms:  No  Any diet changes including alcohol intake, vegetables or greens since the last visit:  No Any illnesses or hospitalizations since the last visit:  No Any signs of clotting since the last visit (including chest discomfort, dizziness, shortness of breath, arm tingling, slurred speech, swelling or redness in leg):  No  MEDICATIONS * MULTIVITAMIN Take 1 tablet by mouth once a day CRESTOR 10 MG  TABS (ROSUVASTATIN CALCIUM) Take 1 tablet by mouth once a day TRIAMCINOLONE ACETONIDE 0.1 % CREA (TRIAMCINOLONE ACETONIDE) used twice daily as needed * COUMADIN 2.5 MG TABS (WARFARIN SODIUM) one daily 5 times per week and  2 daily each Monday and Thursday ASPIR-LOW 81 MG TBEC (ASPIRIN) 1 once daily CARDIZEM CD 180 MG XR24H-CAP (DILTIAZEM HCL COATED BEADS) one daily

## 2010-03-23 NOTE — Progress Notes (Signed)
Summary: FYI  Phone Note Call from Patient   Caller: Patient Call For: Gordy Savers  MD Summary of Call: home nurse advised call to let you know he had 1 nosebleed but got it stopped- says he probably blew his nose too hard. No ohter problems. Initial call taken by: Raechel Ache, RN,  April 13, 2009 11:22 AM  Follow-up for Phone Call        noted Follow-up by: Gordy Savers  MD,  April 13, 2009 12:53 PM

## 2010-03-24 LAB — CROSSMATCH
ABO/RH(D): A POS
Antibody Screen: NEGATIVE
Unit division: 0

## 2010-03-24 LAB — HEMOGLOBIN AND HEMATOCRIT, BLOOD
HCT: 29.5 % — ABNORMAL LOW (ref 39.0–52.0)
Hemoglobin: 8.8 g/dL — ABNORMAL LOW (ref 13.0–17.0)

## 2010-03-25 LAB — CBC
MCH: 23 pg — ABNORMAL LOW (ref 26.0–34.0)
MCHC: 29 g/dL — ABNORMAL LOW (ref 30.0–36.0)
Platelets: 295 10*3/uL (ref 150–400)
RDW: 21.4 % — ABNORMAL HIGH (ref 11.5–15.5)

## 2010-03-25 LAB — BASIC METABOLIC PANEL
BUN: 21 mg/dL (ref 6–23)
CO2: 24 mEq/L (ref 19–32)
Calcium: 9 mg/dL (ref 8.4–10.5)
Creatinine, Ser: 1.48 mg/dL (ref 0.4–1.5)
GFR calc non Af Amer: 45 mL/min — ABNORMAL LOW (ref >60)
Glucose, Bld: 93 mg/dL (ref 70–99)
Sodium: 138 mEq/L (ref 135–145)

## 2010-03-25 NOTE — Assessment & Plan Note (Signed)
Summary: pt/njr  Nurse Visit   Allergies: 1)  ! Pletal Laboratory Results   Blood Tests      INR: 2.6   (Normal Range: 0.88-1.12   Therap INR: 2.0-3.5) Comments: [name]  February 18, 2010 10:09 AM     Orders Added: 1)  Est. Patient Level I [99211] 2)  Protime [04540JW]   ANTICOAGULATION RECORD PREVIOUS REGIMEN & LAB RESULTS Anticoagulation Diagnosis:  427.31<V58.61,V58.83 on  09/12/2006 Previous INR Goal Range:  2.0-3.0 on  07/31/2007 Previous INR:  2.7 on  01/22/2010 Previous Coumadin Dose(mg):  5mg  only on thu. then 2.5mg  other days on  05/29/2009 Previous Regimen:  same on  01/22/2010 Previous Coagulation Comments:  Dr.K said it was ok to not send to [facility]. on  04/24/2009  NEW REGIMEN & LAB RESULTS Current INR: 2.6 Regimen: same  Repeat testing in: 4 weeks  Anticoagulation Visit Questionnaire Coumadin dose missed/changed:  No Abnormal Bleeding Symptoms:  No  Any diet changes including alcohol intake, vegetables or greens since the last visit:  No Any illnesses or hospitalizations since the last visit:  No Any signs of clotting since the last visit (including chest discomfort, dizziness, shortness of breath, arm tingling, slurred speech, swelling or redness in leg):  Yes  MEDICATIONS * MULTIVITAMIN Take 1 tablet by mouth once a day CRESTOR 10 MG  TABS (ROSUVASTATIN CALCIUM) Take 1 tablet by mouth once a day * COUMADIN 2.5 MG TABS (WARFARIN SODIUM) one daily 5 times per week and  2 daily each Monday and Thursday ASPIR-LOW 81 MG TBEC (ASPIRIN) 1 once daily CARBIDOPA-LEVODOPA CR 25-100 MG CR-TABS (CARBIDOPA-LEVODOPA) 1/2 tablet at bedtime DILTIAZEM HCL COATED BEADS 180 MG XR24H-CAP (DILTIAZEM HCL COATED BEADS) one daily

## 2010-03-25 NOTE — Progress Notes (Signed)
Summary: REQUEST FOR SAMPLES  Phone Note Call from Patient   Caller: Daughter  Linton Rump Lorin Picket)  [phone] Summary of Call: Pt req samples of Crestor 10mg  if available - Please...Marland KitchenMarland KitchenMarland Kitchen # [phone].  Initial call taken by: Debbra Riding,  February 05, 2010 9:15 AM  Follow-up for Phone Call        spoke with Amy - we have a few 5mg  and he can take 2 pills . samples placed up front for pick up. KIk Follow-up by: Duard Brady LPN,  February 05, 2010 10:57 AM

## 2010-03-26 ENCOUNTER — Other Ambulatory Visit: Payer: Self-pay

## 2010-03-26 NOTE — Letter (Signed)
Summary: [facility]  [facility]   Imported By: Maryln Gottron 07/08/2009 13:00:04  _____________________________________________________________________  External Attachment:    Type:   Image     Comment:   External Document

## 2010-03-30 ENCOUNTER — Encounter (INDEPENDENT_AMBULATORY_CARE_PROVIDER_SITE_OTHER): Payer: Medicare Other | Admitting: Vascular Surgery

## 2010-03-30 DIAGNOSIS — I714 Abdominal aortic aneurysm, without rupture, unspecified: Secondary | ICD-10-CM

## 2010-03-30 NOTE — Procedures (Signed)
NAMEVESTAL, MARKIN                 ACCOUNT NO.:  [id number]  MEDICAL RECORD NO.:  [id number]          PATIENT TYPE:  INP  LOCATION:  2925                         FACILITY:  [facility]  PHYSICIAN:  Thereasa Solo. Little, M.D. DATE OF BIRTH:  1923-07-22  DATE OF PROCEDURE:  03/18/2010 DATE OF DISCHARGE:                           CARDIAC CATHETERIZATION   INDICATIONS FOR TEST:  This 77 year old male had bypass surgery in 1997. He presented with chest pain and low positive enzymes consistent with a non-Q-wave MI.  An echocardiogram shows an inferior wall motion abnormality with an ejection fraction of 45-50%.  He has had atrial flutter.  He has been maintained on Coumadin, this has been stopped.  His INR is now 1.55 and he was brought to the Cath Lab for cardiac catheterization.  He has known peripheral vascular disease and has claudication.  PROCEDURE:  After obtaining written informed consent, the patient was prepped and draped in usual sterile fashion exposing the right groin. Following local anesthetic with 1% Xylocaine with the aid of a Smart needle and after multiple attempts, I was able to enter the femoral artery.  There was dense calcification and it was difficult to pass the guidewire up the iliacs.  I was finally able to place a 5-French introducer sheath into the right femoral artery and with the aid of a Glidewire and a right coronary artery, I was able to navigate his iliacs and his distal aorta, which clearly had marked tortuosity and was consistent with an aneurysm.  Once I had raised the aortic root, all catheter exchanges were performed with a long exchange wire.  Left and right coronary arteriography, graft visualization and distal aortogram was performed.  I did not do a ventriculogram.  RESULTS: 1. Hemodynamic monitoring:  His central aortic pressure was 169/79. 2. Total contrast 115 mL. 3. Equipment:  A 5-French Judkins configuration catheters and a     Glidewire  with a long exchange wire.  CORONARY ARTERIOGRAPHY:  On fluoroscopy, there was diffuse calcification of his iliacs, his infrarenal aorta, his aortic arch and his subclavian area as well as his coronary arteries. 1. Left main.  The left main was subtotally occluded.  There was faint     visualization of the circumflex, which gave rise to the some     collaterals to the right coronary artery. 2. Right coronary artery, 100% occluded in its midportion. 3. Grafts:  Saphenous vein graft to the RCA, 100% occluded at the     ostium. 4. Saphenous vein graft to the diagonal.  The graft was widely patent.     The diagonal was widely patent.  There was retrograde filling up     the diagonal all the way to the left main. 5. Saphenous vein graft to the OM.  The graft was widely patent.  The     OM was well visualized, filled retrograde into the circumflex,     filled the first marginal and then retrograde down the ongoing     circumflex.  There was some left-to-right collaterals noted. 6. Internal mammary artery to the LAD:  I did not selectively  engage     this artery.  There was diffuse calcification around the ostium of     it and cusp visualization revealed the graft to be widely patent,     tortuous and quite large in diameter.  The LAD was visualized and     appeared to be free of disease.  DISTAL AORTOGRAM:  The distal aortogram done at the level of the renal arteries, revealed a large abdominal aortic aneurysm probably greater than 5 cm.  There was very little flow distal to the aneurysm.  ASSESSMENT: 1. Loss of saphenous vein graft to the right coronary artery, which is     the cause for the non-Q-wave myocardial infarction.  He does have     collaterals into the distal right. 2. Remainder of the vein grafts were patent. 3. 40-50% ejection fraction with inferior hypokinesis by     echocardiogram during this hospitalization. 4. Large infrarenal abdominal aortic aneurysm.  I will not  start him back on heparin and I do not plan to start him back on Coumadin secondary to the aneurysm.  I ordered an abdominal ultrasound to excise this, but he would not be a candidate for any type of intervention for an least 4-6 weeks particularly since the aneurysm seems to be asymptomatic.  He has anemia and this will need to be worked up also.          ______________________________ Thereasa Solo. Little, M.D.     ABL/MEDQ  D:  03/18/2010  T:  03/19/2010  Job:  [id number]  cc:   Shelda Jakes, MD Cath Lab  Electronically Signed by Julieanne Manson M.D. on 03/30/2010 12:23:33 PM

## 2010-04-01 NOTE — Discharge Summary (Signed)
NAME:  Corey Mcdonald, Corey Mcdonald                 ACCOUNT NO.:  [id number]  MEDICAL RECORD NO.:  [id number]           PATIENT TYPE:  I  LOCATION:  2022                         FACILITY:  [facility]  PHYSICIAN:  Landry Corporal, MD DATE OF BIRTH:  1923-04-13  DATE OF ADMISSION:  03/15/2010 DATE OF DISCHARGE:  03/25/2010                              DISCHARGE SUMMARY   DISCHARGE DIAGNOSES: 1. Non-ST-elevation myocardial infarction with peak troponin 2.21. 2. Paroxysmal atrial flutter, undergoing planned cardioversion on     March 16, 2010, maintaining sinus rhythm at discharge. 3. Coronary artery disease with new occluded vein graft to the right     coronary artery this admission. 4. Left ventricular dysfunction with ejection fraction of 45-50%. 5. Abdominal aortic aneurysm at 5.5 x 5.4 cm, referring to Dr. Tawanna Cooler     Early for further evaluation. 6. Acute blood loss anemia, now stable after a total of 3 transfusions     of packed red blood cells.     a.     Esophagogastroduodenoscopy done on March 22, 2010, no high      bleeding lesion to use proton pump inhibitor indefinitely. 7. Legally blind secondary to macular degeneration. 8. Moderate mitral regurgitation by 2-D echocardiogram. 9. Obstructive sleep apnea with continuous positive airway pressure at     night. 10.We had deceased Coumadin secondary to the gastrointestinal bleed     and abdominal aneurysm. 11.Ecchymosis the cath site. 12.Dyslipidemia, treated. 13.Previous bypass grafting in 1997 of his coronary arteries. 14.History of peripheral vascular disease with bilateral carotid     bruits.  DISCHARGE CONDITION:  Improved.  PROCEDURES:  Combined left heart catheterization on March 18, 2010, by Dr. Julieanne Manson with graft visualization.  Please see dictated results, 100% occluded vein graft to the RCA which was new.  Other grafts were patent.  He was found to have large infrarenal abdominal aortic aneurysm.  On March 22, 2010, EGD done by Dr. Dorena Cookey, inflamed esophagus and stricture and second portion of duodenal bulb narrowing, scarring probably from old peptic ulcer disease.  No high risk bleeding lesions, but would treat with PPI indefinitely.  DISCHARGE MEDICATIONS:  See medication reconciliation sheet.  We discontinued his Coumadin.  He is on a baby aspirin daily, Protonix twice a day.  He will need a PPI twice a day and not acid reducer, but a PPI.  We also added metoprolol to his medical regimen.  DISCHARGE INSTRUCTIONS: 1. He will need a CBC done on March 29, 2010. 2. He will need to continue his CPAP as before. 3. Increase activity slowly.  May shower. 4. Heart-healthy diet. 5. Wash cath site with soap and water.  Call if any bleeding,     swelling, or drainage. 6. He will see Dr. Tawanna Cooler Early for abdominal aortic aneurysm     evaluation. 7. He will follow up with Dr. Julieanne Manson in 3 weeks and the office     will call with date and time.  HOSPITAL COURSE:  Corey Mcdonald is an 77 year old patient of Dr. Fredirick Maudlin and was admitted to the emergency room on March 15, 2009, when he presented with substernal chest pain that was sudden onset, radiated to the jaw and bilateral arms to his elbows.  His daughter called EMS.  By the time he arrived in the ER, he was chest pain free, but he was in rapid atrial flutter by EKG, treated with diltiazem.  Heart rate decreased.  He was found to be anemic with a hemoglobin of 7.6, hematocrit 26.7.  He was transfused 2 units of packed cells and plans were for cardioversion.  He was cardioverted on March 16, 2010, by Dr. Rennis Golden.  Cardizem drip was discontinued, the patient's beta-blocker and p.o. Cardizem.  He then underwent cardiac catheterization with results as previously described.  GI consult was obtained as well.  Hemoglobin dropped again.  He was given a total of 3 units of packed cells and underwent EGD with no obvious site of bleeding.  He did  have 1 heme- positive stool.  By March 25, 2010, he was stable and ready for discharge.  LABORATORY VALUES:  Initial hemoglobin 7.6, hematocrit 26.7, WBC 8, and platelets 443.  He was transfused 2 units with hemoglobin up to 9.0 and hematocrit 30.2 and then slow drift down to 7.7 and hematocrit 26. Again, transfused 1 unit of packed cells.  Hemoglobin 9.2 with hematocrit 31.1 and on March 23, 2010, on the day of discharge hemoglobin 9.1, hematocrit 31.4, WBC 8.3, and platelets are 295.  Please note, INR on admission was 2.49.  Chemistry at discharge:  Sodium 138, potassium 3.9, chloride 104, CO2 of 24, glucose 93, BUN 21, and creatinine 1.48 and these have been fairly stable throughout hospitalization.  Calcium 9.0.  Cardiac enzymes:  CK 147, MBs 14, troponin-I 2.21 on admission and that was the peak troponin.  Followup CK 163, MB of 10.2, and troponin 1.98. BNP was 556.  Other CK-MBs and troponin were negative.  TSH was 2.523.  Iron was 56, ferritin was 7, transferrin was 232.  The patient was A+ and transfused a total of 3 units of packed cells.  Initial stool was heme positive, but at discharge most recent stool was heme negative.  MRSA screen was negative.  RADIOLOGY:  Chest x-ray on admission, previous CABG, cardiac enlargement, chronic lung markings, no active process identified. Ultrasound of the abdominal aorta, infrarenal abdominal aortic aneurysm is increased to 5.5 x 5.4 cm in diameter and 9.2 cm length, previously being 4.4 x 4 cm x 7.7 in length in July 2010.  He also had ectasia of the right common iliac artery which has mildly increased and stable renal cyst.  EKG, initially atrial flutter with rapid ventricular response and then atrial flutter rate controlled with no acute ST changes and sinus rhythm and normal EKG.  At the time of discharge, cardiac rehab ambulated the patient, ambulated without problems.  He held the rehab nurse's elbow.  He had  no complaints at the time of discharge.  Blood pressure was 130/50.  Dr. Clarene Duke has seen and evaluated him and agreed with discharge home.     Darcella Gasman. Ingold, N.P.  The patient was seen by Dr. Clarene Duke at the time of discharge and was stable for discharge.  I agree with Laura's note.  ______________________________ Landry Corporal, MD    LRI/MEDQ  D:  03/25/2010  T:  03/26/2010  Job:  [id number]  cc:   Thereasa Solo. Little, M.D. John C. Madilyn Fireman, M.D. Gordy Savers, MD  Electronically Signed by Nada Boozer N.P. on 03/30/2010 10:50:07 AM Electronically Signed  by Bryan Lemma MD on 04/01/2010 09:44:12 PM

## 2010-04-02 NOTE — Consult Note (Signed)
NEW PATIENT CONSULTATION  Corey Mcdonald, Corey Mcdonald DOB:  [birth year]                                       03/30/2010 ZOXWR#:[id number]  Patient presents today for discussion of his infrarenal abdominal aortic aneurysm.  He is a very pleasant 77 year old gentleman with a known history of infrarenal abdominal aortic aneurysm.  I have a study from July 2010 ultrasound showing maximal diameter of 5.4 cm.  He most recently had a repeat study showing a maximal diameter of 5.5 cm.  He did have a cardiac catheterization by Dr. Caprice Kluver at [facility] on 03/18/2010, and I have this for review, including a flesh aortogram from the level of the renals to the aortic bifurcation.  This does show aneurysmal degeneration.  He has no symptoms referable to his aneurysm.  He does have a history of hypertension, elevated cholesterol, and myocardial infarction.  He did undergo coronary bypass grafting in 1997 and has had hernia surgery 25 years ago.  He is blind from macular degeneration.  SOCIAL HISTORY:  He is married with 2 children.  He is retired.  He quit smoking 23 years ago.  He does not drink alcohol.  FAMILY HISTORY:  Negative for premature atherosclerotic disease.  REVIEW OF SYSTEMS:  Reveals no weight loss or gain.  He weighs 221 pounds.  He is 5 feet 11 inches tall.  Does have typical claudication pain with walking from a vascular standpoint. CARDIAC:  Positive for chest pain, chest tightness, shortness of breath with exertion, atrial fibrillation and arrhythmias.  GI:  Positive for black stool, reflux, hiatal hernia with a recent GI workup due to blood in his stool. PULMONARY:  There is a positive productive cough. URINARY:  Frequency and urinary burning.  Review of systems are otherwise negative.  PHYSICAL EXAMINATION:  A well-developed and well-nourished white male appearing stated age in no acute distress.  Blood pressure is 144/70, pulse 62, respirations 18.  His oxygen  saturations are 98% on room air. HEENT:  Normal.  He does have changes from prior eye surgery.  Chest: Clear bilaterally.  Heart:  Irregular with no murmurs.  He does have palpable radial pulses.  He has absent popliteal and distal pulses bilaterally.  Abdomen is moderately obese.  I do not feel an aneurysm. Musculoskeletal shows no major deformity or cyanosis.  Neurologic:  No focal weakness or paresthesias.  Skin without ulcers or rashes.  I reviewed these findings and his prior studies with patient and his family present.  I would recommend a repeat ultrasound in 6 months.  I explained that it is reassuring that he has had no significant change since his ultrasound in July 2010.  I have explained that I feel that his annual risk of rupture is 5% or less and certainly would feel that any surgical treatment would approach this.  Therefore, we would recommend observation at 28-month intervals and will see him again with an ultrasound in 6 months to rule out any progression in the size of his aneurysm.  I did discuss symptoms of aneurysm leakage with patient and his family and explained that he needs to present immediately to [facility] Emergency Room via EMS should this occur.    Larina Earthly, M.D. Electronically Signed  TFE/MEDQ  D:  03/30/2010  T:  03/31/2010  Job:  [id number]  cc:   Gordy Savers,  MD Thereasa Solo. Little, M.D.

## 2010-04-09 ENCOUNTER — Encounter: Payer: Self-pay | Admitting: Internal Medicine

## 2010-04-12 ENCOUNTER — Encounter: Payer: Self-pay | Admitting: Internal Medicine

## 2010-04-12 ENCOUNTER — Ambulatory Visit (INDEPENDENT_AMBULATORY_CARE_PROVIDER_SITE_OTHER): Payer: Medicare Other | Admitting: Internal Medicine

## 2010-04-12 VITALS — BP 130/78 | Temp 98.2°F | Wt 222.0 lb

## 2010-04-12 DIAGNOSIS — D649 Anemia, unspecified: Secondary | ICD-10-CM

## 2010-04-12 DIAGNOSIS — I4891 Unspecified atrial fibrillation: Secondary | ICD-10-CM

## 2010-04-12 DIAGNOSIS — I2510 Atherosclerotic heart disease of native coronary artery without angina pectoris: Secondary | ICD-10-CM

## 2010-04-12 DIAGNOSIS — I714 Abdominal aortic aneurysm, without rupture: Secondary | ICD-10-CM

## 2010-04-12 DIAGNOSIS — Z8711 Personal history of peptic ulcer disease: Secondary | ICD-10-CM

## 2010-04-12 DIAGNOSIS — I10 Essential (primary) hypertension: Secondary | ICD-10-CM

## 2010-04-12 NOTE — Patient Instructions (Signed)
Limit your sodium (Salt) intake  Return in 3 months for follow-up   

## 2010-04-12 NOTE — Progress Notes (Signed)
Subjective:    Patient ID: Corey Mcdonald, male    DOB: 1923-05-15, 77 y.o.   MRN: [id number]  HPI  An 77 year old patient who has a prior history of coronary artery disease and is status post CABG.  He was admitted to the hospital on March 15, 2010 for treatment of an acute ST elevation MI.  Cardiac catheterization revealed an occluded vein graft to the RCA.  He has left ventricular dysfunction, which is mild with a ejection fraction of 45 to 50%.  He has a known abdominal aortic aneurysm, measuring 5.1 x 5.4 cm he has seen Dr. Tawanna Cooler early since his hospital discharge.  The patient was noted to have acute blood loss anemia and required transfusions of 3 units of packed RBCs.  He underwent upper panendoscopy that was negative for high-risk bleed.  His Coumadin anticoagulation has been discontinued.  The patient's acute MI was associated with atrial flutter with a rapid ventricular response.  This required treatment IV Cardizem prior to his cardiac catheterization.  Initial hemoglobin 7.6%.  He has been seen in follow-up and reported his hemoglobin is in excess of 12 g percent    Review of Systems  Constitutional: Negative for fever, chills, appetite change and fatigue.  HENT: Negative for hearing loss, ear pain, congestion, sore throat, trouble swallowing, neck stiffness, dental problem, voice change and tinnitus.   Eyes: Negative for pain, discharge and visual disturbance.  Respiratory: Positive for cough. Negative for chest tightness, wheezing and stridor.   Cardiovascular: Negative for chest pain, palpitations and leg swelling.  Gastrointestinal: Negative for nausea, vomiting, abdominal pain, diarrhea, constipation, blood in stool and abdominal distention.  Genitourinary: Negative for urgency, hematuria, flank pain, discharge, difficulty urinating and genital sores.       Irritated hemorrhoids  Musculoskeletal: Negative for myalgias, back pain, joint swelling, arthralgias and gait problem.  Skin:  Negative for rash.  Neurological: Negative for dizziness, syncope, speech difficulty, weakness, numbness and headaches.  Hematological: Negative for adenopathy. Does not bruise/bleed easily.  Psychiatric/Behavioral: Negative for behavioral problems and dysphoric mood. The patient is not nervous/anxious.        Objective:   Physical Exam  Constitutional: He is oriented to person, place, and time. He appears well-developed and well-nourished.       Moderately obese  HENT:  Head: Normocephalic.  Right Ear: External ear normal.  Left Ear: External ear normal.  Eyes: Conjunctivae and EOM are normal.  Neck: Normal range of motion. No JVD present.  Cardiovascular: Normal rate and normal heart sounds.   Pulmonary/Chest: Effort normal and breath sounds normal. No respiratory distress. He has no wheezes. He has no rales. He exhibits no tenderness.  Abdominal: Bowel sounds are normal. He exhibits no distension. There is no tenderness.  Genitourinary:       External hemorrhoids noted that were noninflamed  Musculoskeletal: Normal range of motion. He exhibits no edema and no tenderness.  Lymphadenopathy:    He has no cervical adenopathy.  Neurological: He is alert and oriented to person, place, and time.  Skin: Skin is warm and dry. No rash noted.  Psychiatric: He has a normal mood and affect. His behavior is normal.          Assessment & Plan:  Coronary artery disease, status post recent ST segment elevation MI secondary to occluded vein graft to the RCA History of atrial flutter- fibrillation.  Presently off anticoagulation therapy due to his recent acute blood loss anemia Hypertension stable Anemia-will check follow-up CBC today

## 2010-04-13 LAB — CBC WITH DIFFERENTIAL/PLATELET
Basophils Absolute: 0 10*3/uL (ref 0.0–0.1)
Eosinophils Absolute: 0.2 10*3/uL (ref 0.0–0.7)
Hemoglobin: 11.6 g/dL — ABNORMAL LOW (ref 13.0–17.0)
Lymphocytes Relative: 22.7 % (ref 12.0–46.0)
MCHC: 32.9 g/dL (ref 30.0–36.0)
Monocytes Relative: 14.4 % — ABNORMAL HIGH (ref 3.0–12.0)
Neutro Abs: 3.3 10*3/uL (ref 1.4–7.7)
Neutrophils Relative %: 59.1 % (ref 43.0–77.0)
Platelets: 243 10*3/uL (ref 150.0–400.0)
RDW: 28.2 % — ABNORMAL HIGH (ref 11.5–14.6)

## 2010-04-16 ENCOUNTER — Ambulatory Visit: Payer: Self-pay | Admitting: Internal Medicine

## 2010-04-21 NOTE — Consult Note (Signed)
NAMEJONNY, Corey Mcdonald                 ACCOUNT NO.:  [id number]  MEDICAL RECORD NO.:  [id number]          PATIENT TYPE:  INP  LOCATION:  2925                         FACILITY:  [facility]  PHYSICIAN:  Willis Modena, MD     DATE OF BIRTH:  1923-12-04  DATE OF CONSULTATION:  03/18/2010 DATE OF DISCHARGE:                                CONSULTATION   CONSULTING PHYSICIAN:  Landry Corporal, MD  REASON FOR CONSULTATION:  Anemia.  HISTORY OF PRESENT ILLNESS:  Corey Mcdonald is a very pleasant 77 year old gentleman with multiple medical problems including atrial fibrillation postcardioversion in the past, obstructive sleep apnea, coronary artery disease with bypass, abdominal aortic aneurysm, who presented to the hospital with complaints of chest pain.  He ultimately was found to have atrial fibrillation with rapid ventricular response.  He also was found to have a non-ST-elevation myocardial infarction.  He underwent heart catheterization today.  We were asked to see Corey Mcdonald for complaints of anemia and blood in stool.  Corey Mcdonald tells me he noticed some red blood in his stool for a day or two a few weeks ago.  He has not had any blood in stool, whatsoever for at least 3 weeks.  He denies any abdominal pain, nausea, vomiting, hematemesis, dysphagia, odynophagia, change in bowel habits, loss of appetite, weight loss.  He has had a colonoscopy by our practice, by Corey Mcdonald, in June 2011, that showed sigmoid diverticulosis, but was otherwise normal.  PAST MEDICAL HISTORY: 1. Atrial fibrillation. 2. Obstructive sleep apnea. 3. Benign prostatic hypertrophy. 4. Coronary artery disease with CABG. 5. Hypertension. 6. Low back pain. 7. Abdominal aortic aneurysm. 8. Macular degeneration.  ALLERGIES:  PLETAL and CARDURA.  FAMILY HISTORY:  Father died of stroke at age 66.  Mother died at age 40.  Brother with coronary artery disease.  SOCIAL HISTORY:  The patient is married and his wife  is at the bedside. History of tobacco use in the past.  No alcohol.  REVIEW OF SYSTEMS:  As per history of present illness.  All other systems were explored in detail and negative.  MEDICATIONS:  Aspirin, Cardizem, Crestor.  PHYSICAL EXAMINATION:  VITAL SIGNS:  Blood pressure 142/56, heart rate 95. GENERAL:  Corey Mcdonald is in no acute distress, appears younger than the stated age.  He is nontoxic appearing. HEENT:  Eyes, sclerae anicteric.  Conjunctivae slightly pale. NECK:  Supple without thyromegaly, lymphadenopathy, or bruits. LUNGS:  Clear to auscultation without rales, rhonchi, or wheeze. HEART:  Heart rhythm and later are actually regular.  No obvious murmurs, rubs, gallops. ABDOMEN:  Protuberant, but soft, nontender, nondistended.  He has some compression in his femoral artery on the right side from his cath today. SKIN:  Occasional ecchymoses and venous stasis changes. NEUROLOGIC:  Diffusely nonfocal without lateralizing signs.  Vision impaired due to his macular degeneration and he is legally blind.  LABORATORY STUDIES:  Troponin was elevated maximally at 2.2.  Sodium 138, potassium 3.8, chloride 106, bicarb 24, BUN 17, creatinine 1.2. INR now is 1.55.  White count 8.8, hemoglobin 9.0, platelet count is 289.  Beta-natriuretic peptide  is elevated at 556.  RADIOLOGIC STUDIES:  Chest x-ray done a few days ago shows prior CABG, cardiomegaly, chronic lung markings.  No acute cardiopulmonary disease.  IMPRESSION:  Corey Mcdonald is an 77 year old gentleman presenting with chest pain, found to have atrial fibrillation with rapid ventricular response as well as a non-ST-elevation myocardial infarction.  I have been asked to see Corey Mcdonald for anemia and gastrointestinal bleeding. He has a history of gastrointestinal bleeding several weeks ago, but none recently.  He had a colonoscopy about 7 months ago.  He is not actively bleeding at this time.  PLAN: 1. I would prefer expectant  management given his lack of GI bleeding     as well as his non-ST-elevation MI. 2. If, over the next few days, his anemia persists or worsens, one     could consider an endoscopy to assess for any upper tract GI     bleeding source, although I think this is highly unlikely. 3. We will follow along with you.  I would be reluctant to pursue     endoscopic or radiographic evaluation for his anemia at this time     in the setting of a recent non-ST-elevation MI, as well as his     recent cardiac catheterization and his clinical lack of bleeding. 4. We will follow along with you.  Thank you very much for allowing [place] to participate in Corey Mcdonald's care.     Willis Modena, MD     WO/MEDQ  D:  03/18/2010  T:  03/19/2010  Job:  [id number]  Electronically Signed by Willis Modena  on 04/21/2010 05:40:03 PM

## 2010-04-29 NOTE — Op Note (Signed)
  NAMECARSTEN, [name]                 ACCOUNT NO.:  [id number]  MEDICAL RECORD NO.:  [id number]          PATIENT TYPE:  INP  LOCATION:  2022                         FACILITY:  [facility]  PHYSICIAN:  Dequincy Born C. Madilyn Fireman, M.D.    DATE OF BIRTH:  [birth year]  DATE OF PROCEDURE:  03/22/2010 DATE OF DISCHARGE:                              OPERATIVE REPORT   INDICATION FOR PROCEDURE:  Anemia and heme-positive stools in a patient recently diagnosed with atrial fibrillation who is recommended for anticoagulation.  DESCRIPTION OF PROCEDURE:  The patient was placed in the left lateral decubitus position then placed on the pulse monitor with continuous low- flow oxygen delivered by nasal cannula.  He was sedated with 25 mcg IV fentanyl, 2 mg IV Versed.  Olympus video endoscope was advanced under direct vision into the oropharynx and esophagus.  Esophagus was straight and of normal caliber with squamocolumnar line at 34 cm.  There was a clearly seen lower esophageal ring or stricture with some inflammation and exudate and erythema but no definite ulceration or other high-risk stigma of hemorrhage.  There was slight resistance to passage of the scope beyond it.  There was a 2-3 cm hiatal hernia distal to it with no erosions or ulcers seen within the hernia sac.  The stomach was entered and a small amount of liquid secretions were suctioned from the fundus. Retroflexed view of the cardia confirmed a hiatal hernia and was otherwise unremarkable.  The fundus, body, antrum, and pylorus all appeared normal.  Duodenum was entered and the bulb appeared normal. However, in the second portion of the duodenum there was a fairly tight angulated fibrosed area that was not easy to traverse.  This was able to be traversed eventually and was it hard to keep this area in view. There was some friability from the scope passing beyond it but I could not see any ulcer, although I could not absolutely rule 1 out.  The scope  was then withdrawn and the patient returned to the recovery room in stable condition.  He tolerated the procedure well and there were no immediate complications.  IMPRESSION: 1. Somewhat inflamed esophageal ring or stricture. 2. Duodenal stenosis of the second portion probably from scarring from     old peptic ulcer disease.  No active ulcer seen but could not     conclusively rule out.  PLAN:  I see no contraindication to anticoagulation but would treat indefinitely with a proton pump inhibitor based on the inflamed lower esophageal stricture and duodenal deformity.          ______________________________ [name], M.D.     JCH/MEDQ  D:  03/22/2010  T:  03/22/2010  Job:  [id number]  Electronically Signed by Dorena Cookey M.D. on 04/27/2010 07:04:56 PM

## 2010-05-26 LAB — URINALYSIS, ROUTINE W REFLEX MICROSCOPIC
Bilirubin Urine: NEGATIVE
Bilirubin Urine: NEGATIVE
Glucose, UA: NEGATIVE mg/dL
Hgb urine dipstick: NEGATIVE
Ketones, ur: NEGATIVE mg/dL
Nitrite: NEGATIVE
Nitrite: NEGATIVE
Protein, ur: NEGATIVE mg/dL
Specific Gravity, Urine: 1.016 (ref 1.005–1.030)
Specific Gravity, Urine: 1.024 (ref 1.005–1.030)
Urobilinogen, UA: 0.2 mg/dL (ref 0.0–1.0)
Urobilinogen, UA: 1 mg/dL (ref 0.0–1.0)
pH: 5.5 (ref 5.0–8.0)
pH: 7.5 (ref 5.0–8.0)

## 2010-05-26 LAB — CBC
HCT: 33.9 % — ABNORMAL LOW (ref 39.0–52.0)
HCT: 35.6 % — ABNORMAL LOW (ref 39.0–52.0)
HCT: 39.7 % (ref 39.0–52.0)
Hemoglobin: 11.7 g/dL — ABNORMAL LOW (ref 13.0–17.0)
Hemoglobin: 12.3 g/dL — ABNORMAL LOW (ref 13.0–17.0)
Hemoglobin: 13.6 g/dL (ref 13.0–17.0)
MCHC: 34.2 g/dL (ref 30.0–36.0)
MCHC: 34.4 g/dL (ref 30.0–36.0)
MCHC: 34.5 g/dL (ref 30.0–36.0)
MCV: 93.3 fL (ref 78.0–100.0)
MCV: 93.8 fL (ref 78.0–100.0)
MCV: 93.9 fL (ref 78.0–100.0)
Platelets: 176 K/uL (ref 150–400)
Platelets: 177 K/uL (ref 150–400)
Platelets: 217 K/uL (ref 150–400)
RBC: 3.62 MIL/uL — ABNORMAL LOW (ref 4.22–5.81)
RBC: 3.8 MIL/uL — ABNORMAL LOW (ref 4.22–5.81)
RBC: 4.26 MIL/uL (ref 4.22–5.81)
RDW: 13.5 % (ref 11.5–15.5)
RDW: 13.6 % (ref 11.5–15.5)
RDW: 13.9 % (ref 11.5–15.5)
WBC: 11.8 K/uL — ABNORMAL HIGH (ref 4.0–10.5)
WBC: 6.5 K/uL (ref 4.0–10.5)
WBC: 7.1 K/uL (ref 4.0–10.5)

## 2010-05-26 LAB — APTT

## 2010-05-26 LAB — COMPREHENSIVE METABOLIC PANEL WITH GFR
ALT: 23 U/L (ref 0–53)
AST: 21 U/L (ref 0–37)
Albumin: 3.4 g/dL — ABNORMAL LOW (ref 3.5–5.2)
Alkaline Phosphatase: 44 U/L (ref 39–117)
BUN: 23 mg/dL (ref 6–23)
CO2: 25 meq/L (ref 19–32)
Calcium: 8.2 mg/dL — ABNORMAL LOW (ref 8.4–10.5)
Chloride: 110 meq/L (ref 96–112)
Creatinine, Ser: 1.12 mg/dL (ref 0.4–1.5)
GFR calc non Af Amer: >60 mL/min
Glucose, Bld: 113 mg/dL — ABNORMAL HIGH (ref 70–99)
Potassium: 4.5 meq/L (ref 3.5–5.1)
Sodium: 141 meq/L (ref 135–145)
Total Bilirubin: 0.8 mg/dL (ref 0.3–1.2)
Total Protein: 7 g/dL (ref 6.0–8.3)

## 2010-05-26 LAB — TROPONIN I

## 2010-05-26 LAB — PROTIME-INR
INR: 2.64 — ABNORMAL HIGH (ref 0.00–1.49)
INR: 3.01 — ABNORMAL HIGH (ref 0.00–1.49)
INR: 3.44 — ABNORMAL HIGH (ref 0.00–1.49)
Prothrombin Time: 31 seconds — ABNORMAL HIGH (ref 11.6–15.2)
Prothrombin Time: 34.4 seconds — ABNORMAL HIGH (ref 11.6–15.2)

## 2010-05-26 LAB — DIFFERENTIAL
Basophils Absolute: 0 K/uL (ref 0.0–0.1)
Basophils Relative: 0 % (ref 0–1)
Eosinophils Absolute: 0 K/uL (ref 0.0–0.7)
Eosinophils Relative: 0 % (ref 0–5)
Lymphocytes Relative: 2 % — ABNORMAL LOW (ref 12–46)
Lymphs Abs: 0.3 K/uL — ABNORMAL LOW (ref 0.7–4.0)
Monocytes Absolute: 0.4 K/uL (ref 0.1–1.0)
Monocytes Relative: 4 % (ref 3–12)
Neutro Abs: 11.1 K/uL — ABNORMAL HIGH (ref 1.7–7.7)
Neutrophils Relative %: 94 % — ABNORMAL HIGH (ref 43–77)

## 2010-05-26 LAB — URINE CULTURE: Colony Count: 5000

## 2010-05-26 LAB — POCT CARDIAC MARKERS
CKMB, poc: 1.9 ng/mL (ref 1.0–8.0)
Myoglobin, poc: 204 ng/mL (ref 12–200)
Troponin i, poc: <0.05 ng/mL (ref 0.00–0.09)

## 2010-05-26 LAB — CARDIAC PANEL(CRET KIN+CKTOT+MB+TROPI)
CK, MB: 1.1 ng/mL (ref 0.3–4.0)
Relative Index: INVALID (ref 0.0–2.5)
Relative Index: INVALID (ref 0.0–2.5)
Total CK: 59 U/L (ref 7–232)
Total CK: 79 U/L (ref 7–232)

## 2010-05-26 LAB — BASIC METABOLIC PANEL
BUN: 20 mg/dL (ref 6–23)
CO2: 23 mEq/L (ref 19–32)
Calcium: 7.6 mg/dL — ABNORMAL LOW (ref 8.4–10.5)
Calcium: 7.8 mg/dL — ABNORMAL LOW (ref 8.4–10.5)
Chloride: 109 mEq/L (ref 96–112)
Creatinine, Ser: 1.05 mg/dL (ref 0.4–1.5)
Creatinine, Ser: 1.22 mg/dL (ref 0.4–1.5)
GFR calc Af Amer: >60 mL/min (ref >60)
GFR calc Af Amer: >60 mL/min (ref >60)
GFR calc non Af Amer: 57 mL/min — ABNORMAL LOW (ref >60)
Glucose, Bld: 99 mg/dL (ref 70–99)
Sodium: 138 mEq/L (ref 135–145)

## 2010-05-26 LAB — CK TOTAL AND CKMB (NOT AT ARMC)
CK, MB: 2 ng/mL (ref 0.3–4.0)
Relative Index: INVALID (ref 0.0–2.5)
Total CK: 91 U/L (ref 7–232)

## 2010-05-26 LAB — CLOSTRIDIUM DIFFICILE EIA

## 2010-05-30 LAB — URINALYSIS, ROUTINE W REFLEX MICROSCOPIC
Bilirubin Urine: NEGATIVE
Glucose, UA: NEGATIVE mg/dL
Ketones, ur: NEGATIVE mg/dL
Leukocytes, UA: NEGATIVE
pH: 6.5 (ref 5.0–8.0)

## 2010-05-30 LAB — URINE CULTURE: Colony Count: >=100000

## 2010-05-30 LAB — CBC
HCT: 35.8 % — ABNORMAL LOW (ref 39.0–52.0)
HCT: 36.3 % — ABNORMAL LOW (ref 39.0–52.0)
HCT: 36.9 % — ABNORMAL LOW (ref 39.0–52.0)
HCT: 37.2 % — ABNORMAL LOW (ref 39.0–52.0)
Hemoglobin: 11.9 g/dL — ABNORMAL LOW (ref 13.0–17.0)
Hemoglobin: 12.6 g/dL — ABNORMAL LOW (ref 13.0–17.0)
Hemoglobin: 12.8 g/dL — ABNORMAL LOW (ref 13.0–17.0)
Hemoglobin: 10.6 g/dL — ABNORMAL LOW (ref 13.0–17.0)
MCHC: 34.4 g/dL (ref 30.0–36.0)
MCHC: 34.6 g/dL (ref 30.0–36.0)
MCHC: 34.6 g/dL (ref 30.0–36.0)
MCHC: 34.6 g/dL (ref 30.0–36.0)
MCHC: 34.7 g/dL (ref 30.0–36.0)
MCHC: 34.8 g/dL (ref 30.0–36.0)
MCV: 93 fL (ref 78.0–100.0)
MCV: 93.4 fL (ref 78.0–100.0)
MCV: 93.5 fL (ref 78.0–100.0)
Platelets: 183 10*3/uL (ref 150–400)
Platelets: 212 10*3/uL (ref 150–400)
Platelets: 237 10*3/uL (ref 150–400)
RBC: 3.7 MIL/uL — ABNORMAL LOW (ref 4.22–5.81)
RBC: 3.77 MIL/uL — ABNORMAL LOW (ref 4.22–5.81)
RBC: 3.26 MIL/uL — ABNORMAL LOW (ref 4.22–5.81)
RDW: 14.3 % (ref 11.5–15.5)
RDW: 14.3 % (ref 11.5–15.5)
RDW: 14.4 % (ref 11.5–15.5)
RDW: 14.4 % (ref 11.5–15.5)
RDW: 14.4 % (ref 11.5–15.5)
WBC: 11.1 10*3/uL — ABNORMAL HIGH (ref 4.0–10.5)
WBC: 12.2 10*3/uL — ABNORMAL HIGH (ref 4.0–10.5)

## 2010-05-30 LAB — BASIC METABOLIC PANEL
BUN: 13 mg/dL (ref 6–23)
BUN: 14 mg/dL (ref 6–23)
BUN: 22 mg/dL (ref 6–23)
CO2: 21 mEq/L (ref 19–32)
CO2: 23 mEq/L (ref 19–32)
CO2: 24 mEq/L (ref 19–32)
CO2: 24 mEq/L (ref 19–32)
Calcium: 7.6 mg/dL — ABNORMAL LOW (ref 8.4–10.5)
Calcium: 8.1 mg/dL — ABNORMAL LOW (ref 8.4–10.5)
Calcium: 8.1 mg/dL — ABNORMAL LOW (ref 8.4–10.5)
Calcium: 8.7 mg/dL (ref 8.4–10.5)
Chloride: 106 mEq/L (ref 96–112)
Creatinine, Ser: 1 mg/dL (ref 0.4–1.5)
Creatinine, Ser: 1.04 mg/dL (ref 0.4–1.5)
Creatinine, Ser: 1.24 mg/dL (ref 0.4–1.5)
GFR calc Af Amer: >60 mL/min (ref >60)
GFR calc Af Amer: 58 mL/min — ABNORMAL LOW (ref >60)
GFR calc non Af Amer: 56 mL/min — ABNORMAL LOW (ref >60)
GFR calc non Af Amer: >60 mL/min (ref >60)
Glucose, Bld: 107 mg/dL — ABNORMAL HIGH (ref 70–99)
Glucose, Bld: 109 mg/dL — ABNORMAL HIGH (ref 70–99)
Glucose, Bld: 130 mg/dL — ABNORMAL HIGH (ref 70–99)
Potassium: 3.5 mEq/L (ref 3.5–5.1)
Potassium: 3.6 mEq/L (ref 3.5–5.1)
Potassium: 4.5 mEq/L (ref 3.5–5.1)
Potassium: 3.8 mEq/L (ref 3.5–5.1)
Sodium: 135 mEq/L (ref 135–145)
Sodium: 138 mEq/L (ref 135–145)
Sodium: 132 mEq/L — ABNORMAL LOW (ref 135–145)

## 2010-05-30 LAB — CK TOTAL AND CKMB (NOT AT ARMC)
CK, MB: 1.6 ng/mL (ref 0.3–4.0)
Relative Index: 1.1 (ref 0.0–2.5)
Total CK: 151 U/L (ref 7–232)

## 2010-05-30 LAB — PROTIME-INR
INR: 1.9 — ABNORMAL HIGH (ref 0.00–1.49)
Prothrombin Time: 24.8 seconds — ABNORMAL HIGH (ref 11.6–15.2)
Prothrombin Time: 32.4 seconds — ABNORMAL HIGH (ref 11.6–15.2)
Prothrombin Time: 22.8 seconds — ABNORMAL HIGH (ref 11.6–15.2)

## 2010-05-30 LAB — CULTURE, BLOOD (ROUTINE X 2): Culture: NO GROWTH

## 2010-05-31 LAB — BASIC METABOLIC PANEL
BUN: 15 mg/dL (ref 6–23)
CO2: 24 mEq/L (ref 19–32)
CO2: 25 mEq/L (ref 19–32)
Calcium: 9.3 mg/dL (ref 8.4–10.5)
Chloride: 106 mEq/L (ref 96–112)
Chloride: 107 mEq/L (ref 96–112)
Chloride: 109 mEq/L (ref 96–112)
Creatinine, Ser: 1.17 mg/dL (ref 0.4–1.5)
Creatinine, Ser: 1.22 mg/dL (ref 0.4–1.5)
GFR calc Af Amer: >60 mL/min (ref >60)
GFR calc Af Amer: >60 mL/min (ref >60)
GFR calc Af Amer: >60 mL/min (ref >60)
GFR calc Af Amer: >60 mL/min (ref >60)
GFR calc non Af Amer: 57 mL/min — ABNORMAL LOW (ref >60)
Glucose, Bld: 106 mg/dL — ABNORMAL HIGH (ref 70–99)
Glucose, Bld: 118 mg/dL — ABNORMAL HIGH (ref 70–99)
Potassium: 4.1 mEq/L (ref 3.5–5.1)
Sodium: 137 mEq/L (ref 135–145)
Sodium: 140 mEq/L (ref 135–145)

## 2010-05-31 LAB — DIFFERENTIAL
Basophils Absolute: 0.1 10*3/uL (ref 0.0–0.1)
Eosinophils Absolute: 0.2 10*3/uL (ref 0.0–0.7)
Eosinophils Relative: 2 % (ref 0–5)
Lymphocytes Relative: 19 % (ref 12–46)
Lymphs Abs: 1.6 10*3/uL (ref 0.7–4.0)
Neutrophils Relative %: 69 % (ref 43–77)

## 2010-05-31 LAB — PROTIME-INR
INR: 1.5 (ref 0.00–1.49)
INR: 1.8 — ABNORMAL HIGH (ref 0.00–1.49)
INR: 1.9 — ABNORMAL HIGH (ref 0.00–1.49)
INR: 3 — ABNORMAL HIGH (ref 0.00–1.49)
Prothrombin Time: 19.1 seconds — ABNORMAL HIGH (ref 11.6–15.2)
Prothrombin Time: 22.3 seconds — ABNORMAL HIGH (ref 11.6–15.2)
Prothrombin Time: 22.4 seconds — ABNORMAL HIGH (ref 11.6–15.2)

## 2010-05-31 LAB — LIPID PANEL
Cholesterol: 128 mg/dL (ref 0–200)
HDL: 34 mg/dL — ABNORMAL LOW (ref >39)
LDL Cholesterol: 78 mg/dL (ref 0–99)
Total CHOL/HDL Ratio: 3.8 RATIO

## 2010-05-31 LAB — CBC
HCT: 39.2 % (ref 39.0–52.0)
HCT: 40.8 % (ref 39.0–52.0)
HCT: 42.1 % (ref 39.0–52.0)
Hemoglobin: 13.4 g/dL (ref 13.0–17.0)
Hemoglobin: 14.4 g/dL (ref 13.0–17.0)
MCHC: 34 g/dL (ref 30.0–36.0)
MCHC: 34.2 g/dL (ref 30.0–36.0)
MCV: 93 fL (ref 78.0–100.0)
MCV: 93.2 fL (ref 78.0–100.0)
Platelets: 205 10*3/uL (ref 150–400)
RBC: 4.18 MIL/uL — ABNORMAL LOW (ref 4.22–5.81)
RBC: 4.33 MIL/uL (ref 4.22–5.81)
RBC: 4.53 MIL/uL (ref 4.22–5.81)
RBC: 4.66 MIL/uL (ref 4.22–5.81)
RDW: 14.4 % (ref 11.5–15.5)
RDW: 14.6 % (ref 11.5–15.5)
WBC: 8.4 10*3/uL (ref 4.0–10.5)
WBC: 8.7 10*3/uL (ref 4.0–10.5)
WBC: 9.3 10*3/uL (ref 4.0–10.5)

## 2010-05-31 LAB — TROPONIN I: Troponin I: 0.02 ng/mL (ref 0.00–0.06)

## 2010-05-31 LAB — BRAIN NATRIURETIC PEPTIDE: Pro B Natriuretic peptide (BNP): 131 pg/mL — ABNORMAL HIGH (ref 0.0–100.0)

## 2010-05-31 LAB — CARDIAC PANEL(CRET KIN+CKTOT+MB+TROPI)
CK, MB: 1.8 ng/mL (ref 0.3–4.0)
Relative Index: INVALID (ref 0.0–2.5)
Relative Index: INVALID (ref 0.0–2.5)
Troponin I: 0.02 ng/mL (ref 0.00–0.06)
Troponin I: 0.03 ng/mL (ref 0.00–0.06)

## 2010-05-31 LAB — POCT CARDIAC MARKERS
CKMB, poc: <1 ng/mL — ABNORMAL LOW (ref 1.0–8.0)
Myoglobin, poc: 120 ng/mL (ref 12–200)

## 2010-05-31 LAB — CK TOTAL AND CKMB (NOT AT ARMC): CK, MB: 1.9 ng/mL (ref 0.3–4.0)

## 2010-05-31 LAB — HEPARIN LEVEL (UNFRACTIONATED): Heparin Unfractionated: 0.34 IU/mL (ref 0.30–0.70)

## 2010-06-18 ENCOUNTER — Ambulatory Visit (INDEPENDENT_AMBULATORY_CARE_PROVIDER_SITE_OTHER): Payer: Medicare Other | Admitting: Internal Medicine

## 2010-06-18 ENCOUNTER — Encounter: Payer: Self-pay | Admitting: Internal Medicine

## 2010-06-18 DIAGNOSIS — I10 Essential (primary) hypertension: Secondary | ICD-10-CM

## 2010-06-18 DIAGNOSIS — H6120 Impacted cerumen, unspecified ear: Secondary | ICD-10-CM

## 2010-06-18 DIAGNOSIS — I4891 Unspecified atrial fibrillation: Secondary | ICD-10-CM

## 2010-06-18 NOTE — Progress Notes (Signed)
  Subjective:    Patient ID: Corey Mcdonald, male    DOB: [birth year], 77 y.o.   MRN: [id number]  HPI 77 year old patient who has had a prior history of cerumen impactions who presents complaining of diminishing bilateral auditory acuity. He has coronary artery disease and atrial for ablation. He is hospitalized earlier in the year for GI bleeding and Coumadin anticoagulation has been discontinued. Remains on proton next low-dose aspirin. CBC was checked 2 months ago and was near normal. His cardiopulmonary status has been stable. Denies any GI complaints. No abnormal pain or melena    Review of Systems  Constitutional: Negative for fever, chills, appetite change and fatigue.  HENT: Positive for hearing loss. Negative for ear pain, congestion, sore throat, trouble swallowing, neck stiffness, dental problem, voice change and tinnitus.   Eyes: Negative for pain, discharge and visual disturbance.  Respiratory: Negative for cough, chest tightness, wheezing and stridor.   Cardiovascular: Negative for chest pain, palpitations and leg swelling.  Gastrointestinal: Negative for nausea, vomiting, abdominal pain, diarrhea, constipation, blood in stool and abdominal distention.  Genitourinary: Negative for urgency, hematuria, flank pain, discharge, difficulty urinating and genital sores.  Musculoskeletal: Negative for myalgias, back pain, joint swelling, arthralgias and gait problem.  Skin: Negative for rash.  Neurological: Negative for dizziness, syncope, speech difficulty, weakness, numbness and headaches.  Hematological: Negative for adenopathy. Does not bruise/bleed easily.  Psychiatric/Behavioral: Negative for behavioral problems and dysphoric mood. The patient is not nervous/anxious.        Objective:   Physical Exam  Constitutional: He is oriented to person, place, and time. He appears well-developed.       Obese. Blood pressure 120/74  HENT:  Head: Normocephalic.  Right Ear: External ear normal.   Left Ear: External ear normal.       Bilateral cerumen impactions were removed with irrigation  Eyes: Conjunctivae and EOM are normal.  Neck: Normal range of motion.  Cardiovascular: Normal rate and normal heart sounds.   Pulmonary/Chest: Breath sounds normal.  Abdominal: Bowel sounds are normal.  Musculoskeletal: Normal range of motion. He exhibits no edema and no tenderness.  Neurological: He is alert and oriented to person, place, and time.  Psychiatric: He has a normal mood and affect. His behavior is normal.          Assessment & Plan:  Bilateral cerumen impactions History of anemia Atrial fibrillation stable Dyslipidemia  We'll recheck in one month at the time of his routine office visit

## 2010-06-18 NOTE — Patient Instructions (Signed)
Limit your sodium (Salt) intake  Return in 3 months for follow-up   

## 2010-07-06 NOTE — Discharge Summary (Signed)
NAMETRAVONTA, GILL                 ACCOUNT NO.:  [id number]   MEDICAL RECORD NO.:  [id number]          PATIENT TYPE:  INP   LOCATION:  2921                         FACILITY:  [facility]   PHYSICIAN:  Ritta Slot, MD     DATE OF BIRTH:  02/09/1924   DATE OF ADMISSION:  08/01/2008  DATE OF DISCHARGE:  08/05/2008                               DISCHARGE SUMMARY   DISCHARGE DIAGNOSES:  1. New atrial flutter with rapid ventricular response, asymptomatic.      a.     Transesophageal echocardiography with cardioversion on August 04, 2008, and maintaining sinus rhythm at discharge.      b.     History of atrial fibrillation in 2002 with cardioversion at       that time.  2. Coronary artery disease with bypass grafting in 1997, negative      myocardial infarction on this admission.  3. Hypertension with recent hypotension, followed by primary care      physician, but on admission here hypertensive, now stable.  4. Obstructive sleep apnea with continuous positive airway pressure.  5. Dyslipidemia.  6. Legally blind with macular degeneration.   DISCHARGE CONDITION:  Improved.   PROCEDURES:  1. August 04, 2008, transesophageal echocardiography with cardioversion      with 1 shock by Dr. Ritta Slot and the patient converted to      sinus rhythm.  2. Anticoagulation, was nontherapeutic on admission, is now 3.0 at      discharge.  3. Benign prostatic hypertrophy, request to continue Cardura at      discharge, we will decrease dose.   DISCHARGE MEDICATIONS:  1. Coumadin now 3 mg daily, beginning on August 06, 2008.  2. Toprol-XL 25 mg 1 daily.  3. Enteric-coated aspirin 81 mg daily.  4. Cardizem CD 180 mg 1 daily.  5. Only take half of your doxazosin to equal 2 mg at bedtime.   DISCHARGE INSTRUCTIONS:  1. Low-sodium heart-healthy diet.  2. Activity as tolerated.  3. Follow up with Dr. Clarene Duke on August 20, 2008, at 3:00 p.m.  4. Continue CPAP as before.  5. See Dr. Amador Cunas in  [facility] on Friday, August 08, 2008,      and call for the appointment.   HISTORY OF PRESENT ILLNESS:  An 77 year old white married male with  history of coronary disease with bypass grafting in 1997 and history of  AFib in 2002 requiring cardioversion, has been on Coumadin since and  maintained sinus rhythm.  He also is hypertensive but recently more  hypotensive with adjustments in meds, dyslipidemia, obstructive sleep  apnea with CPAP.  His last Cardiolite was in 2007 with normal EF of 73%  and no ischemia.  Two weeks prior to admission, he had episodes of  weakness, dizziness, diaphoresis, and near-syncope.  It resolved  spontaneously.  He had a second episode 1 week ago.  No syncope.  No  chest pain.  No palpitations.  He saw his primary care physician.  His  blood pressure was low.  His Cardizem and his  Cardura were stopped, and  his Toprol was changed to metoprolol 50 b.i.d.  He was seen at [facility]  on August 02, 2008, for carotid Dopplers, heart rate was noted to be 200.  His carotids, he was told, were fine.  He was sent to the emergency room  for tachycardia.  He was asymptomatic.  He was given IV Lopressor and  Cardizem drip, was started in the emergency room and the heart rate  improved at 60-100 beats per minute.   ALLERGIES:  STATINS cause myalgias.  He has been tolerating his Crestor,  which is 10 mg a day.   The patient was admitted, placed on heparin until his INR could be more  therapeutic, underwent TEE and cardioversion.  At the time of discharge,  the patient's rhythm, sinus rhythm to some slight sinus brady of 54.  We  decreased his diltiazem from 240 to 180 and Toprol to 25.  He is also on  Coumadin, and his INR at discharge was 3.   LABORATORY DATA:  Hemoglobin 13.7, hematocrit 40.4, WBC 8.6, platelets  212, MCV 93.2.  Chemistry, sodium 140, potassium 3.5, chloride 109, CO2  25, BUN 15, creatinine 1.17, and glucose 106.  Pro time 33.3, INR is 3.  Please  note up on admission, his INR was 1.5.   Cardiac enzymes were negative, CK ranged 71 to 83, MB 1.9 to 1.8,  troponin I 0.02 to 0.03.   Total cholesterol 128, LDL 78, HDL 34, triglycerides 79.  Stool for  blood was negative, and BNP was 131 on admission.   Chest x-ray:  Cardiomegaly and low lung volumes with superimposed mild  pulmonary venous congestion, suboptimal evaluation of left base.   HOSPITAL COURSE:  As stated, the patient was admitted, was cardioverted,  and was discharged home.  He will follow up as instructed.  In addition  to previously stated medicines, he is also on multivitamin daily and  Crestor 10 mg daily and now doxazosin 4 mg tablet half a tablet to equal  2 mg at bedtime.      Darcella Gasman. [place], N.P.      Ritta Slot, MD  Electronically Signed    LRI/MEDQ  D:  08/05/2008  T:  08/06/2008  Job:  [id number]   cc:   Dr. Alison Murray, MD

## 2010-07-06 NOTE — H&P (Signed)
NAMEHALBERT, JESSON                 ACCOUNT NO.:  [id number]   MEDICAL RECORD NO.:  [id number]          PATIENT TYPE:  INP   LOCATION:  2921                         FACILITY:  [facility]   PHYSICIAN:  Ritta Slot, MD     DATE OF BIRTH:  1923/04/19   DATE OF ADMISSION:  08/01/2008  DATE OF DISCHARGE:                              HISTORY & PHYSICAL   This is a DCCV cardioversion.   INDICATIONS:  Atrial flutter.   PATIENT PROFILE:  Mr. Pretlow is an very pleasant 77 year old gentleman  with a prior history of coronary artery bypass grafting in 1997 with an  episode of PAF in 2002 that was treated with DC cardioversion at that  time back into normal sinus rhythm.  He is maintained on Coumadin for a  paroxysmal atrial fibrillation.  This admission, he was admitted with an  episode of asymptomatic atrial flutter with a rate of around 200  ventricularly, that was slowed down with the metoprolol and diltiazem.  He remains otherwise well.  From a cardiovascular standpoint, he has no  other complaints or concerns.  He is booked for TEE-guided cardioversion  to get him back into normal sinus rhythm for further management.  After  informed consent, the patient underwent a TEE that was diagnostic for no  evidence of left atrial appendage or left atrial thrombus.  He was,  therefore, proceeded with cardioversions.  After informed consent, he  was placed in the supine position.  He was given 17 mg propofol IV for  general anesthetic by Dr. Sondra Come, anesthetist here.  He underwent 75  joules of synchronized shock x1 and restored normal sinus rhythm  immediately.  There were no complications.   IMPRESSION:  Successful direct-current cardioversion back to normal  sinus rhythm.   PLAN:  He will return back to telemetry floor, and he will go home in  the  morning.      Ritta Slot, MD  Electronically Signed     HS/MEDQ  D:  08/04/2008  T:  08/05/2008  Job:  [id number]

## 2010-07-06 NOTE — Discharge Summary (Signed)
NAME:  Corey Mcdonald, Corey Mcdonald                 ACCOUNT NO.:  [id number]   MEDICAL RECORD NO.:  [id number]          PATIENT TYPE:  INP   LOCATION:  3730                         FACILITY:  [facility]   PHYSICIAN:  Rosalyn Gess. Norins, MD  DATE OF BIRTH:  08/13/1923   DATE OF ADMISSION:  09/04/2008  DATE OF DISCHARGE:  09/11/2008                               DISCHARGE SUMMARY   ADMISSION DIAGNOSES:  1. Cellulitis, left lower extremity and foot.  2. Atrial fibrillation.  3. Hypertension.  4. Dehydration.   DISCHARGE DIAGNOSES:  1. Cellulitis, left lower extremity and foot.  2. Atrial fibrillation.  3. Hypertension.  4. Dehydration.   CONSULTANTS:  None.   PROCEDURES:  Ultrasound of the abdomen and aorta performed September 07, 2008, which showed a 5.4-cm maximum transverse diameter, abdominal  aortic aneurysm in the mid abdominal aorta.  Bilateral renal cortical  thinning was noted.  ABIs were ordered and are pending at time of  discharge dictation, that may be done as an outpatient.   HISTORY OF PRESENT ILLNESS:  The patient is an 77 year old Caucasian  gentleman followed by Dr. Eleonore Chiquito for multiple medical  problems including atrial fibrillation.  He presented to the office on  the day of admission where he was found to be weak and hypotensive with  a blood pressure of 90/54.  The patient had increasing pain and swelling  of his left lower extremity.  The patient had recently been hospitalized  for new onset atrial fibrillation and flutter and he was on chronic  Coumadin.  He had no prior history of MRSA or any other skin or soft  tissue infection.  Laboratory done the day prior to admission revealed a  creatinine of 2.2 and a white count of 17,300.  The patient was admitted  with left lower extremity cellulitis and question of possible sepsis  with hypotension and leukocytosis.   Please see the EMR generated H and P for past medical history, family  history, social history, and  medications.   HOSPITAL COURSE:  1. Cellulitis.  The patient was started on IV antibiotics with      vancomycin.  Zosyn was added to his regimen.  On this regimen, he      had significant improvement with decreased calor, rubor, and dolor.      Of note, he had been on Ancef prior to being started on Zosyn.  By      September 10, 2008, the patient was doing significantly better and it      was felt he could be switched to oral antibiotics.  He have      completed a 7-day course of vancomycin, 4 days of Zosyn and was      switched to Augmentin 875 mg b.i.d.  Over the next 24 hours, the      patient remained stable.  He has had no fever.  His foot remained      improved, although there was persistent erythema and mild warmth,      but significantly decreased tenderness.  At this point with the  patient having no fever, having stable blood pressure and having      had significant improvement in his cellulitis can be discharged to      home to complete an outpatient oral antibiotics regimen.  2. Peripheral vascular disease.  The patient did have ultrasound of      the abdomen revealing an abdominal aortic aneurysm.  The patient      also with a question of possible peripheral vascular disease.      Lower extremity arterial Dopplers were not performed prior to      discharge and need to be performed as an outpatient.  3. Atrial fibrillation and flutter, status post DC cardioversion.  The      patient is currently holding sinus rhythm.  He is on Coumadin and      is therapeutic.  The patient will return home on a standard dose of      2.5 mg daily and we will need to have a protime checked on Monday      at the [facility].  4. Obstructive sleep apnea.  This was a stable problem.  The patient      continues on his home CPAP mask.  5. Acute renal failure.  The patient had mild bump in creatinine prior      to admission at 2.2 with hydration and treatment of his underlying       medical problem, this resolved with final creatinine being 1.01.  6. Ophthalmology.  The patient did develop a subconjunctival      hemorrhage in the left eye of the lateral inferior aspect.  This      remained stable.  There was no involvement of the iris or pupil.   DISCHARGE EXAMINATION:  VITAL SIGNS:  Temperature was 96.7, blood  pressure 140/66, heart rate 65, respirations 18.  GENERAL APPEARANCE:  This is an elderly, overweight gentleman sitting in  a [place] chair in no acute distress.  HEENT:  Normocephalic, atraumatic.  The patient does have a  subconjunctival hemorrhage inferior lateral aspect of the left eye.  Vision is impaired.  NECK:  Supple.  CHEST:  The patient is moving air well with no rales, wheezes, or  rhonchi.  CARDIOVASCULAR:  The patient's precordium was quiet.  His heart rate was  regular to my exam.  ABDOMEN:  Obese, soft, and nontender.  EXTREMITIES:  The patient's left distal lower extremity remained mildly  erythematous with 1+ nonpitting edema.  There was a very slight amount  of warmth to the foot, but there was no significant tenderness.  No  further examination conducted.   FINAL LABORATORY:  Blood cultures from September 04, 2008, were no growth.  INR from the day of discharge was 2.2.  CBC from the day of discharge  with a white count of 9800, hemoglobin was 12.6 g.  Basic metabolic  panel from September 08, 2008 with a sodium of 138, potassium of 3.6,  chloride of 106, CO2 of 23, BUN of 13, creatinine 1.01, glucose was 107.   DISCHARGE MEDICATIONS:  The patient will resume all of his home  medications including Coumadin 2.5 mg daily, Crestor 10 mg daily,  metoprolol 25 mg daily, Cardizem CD 180 mg daily, 81 mg aspirin.  He was  also taking doxazosin 2 mg daily, this will be continued.  New  medications will include Augmentin 875 mg b.i.d. for an additional 10  days.   FOLLOW UP:  The patient will be seen  at the [facility] for  protime on Monday,  September 15, 2008 and followup appointment with Dr.  Amador Cunas in 7-10 days.   The patient's condition at time of discharge dictation is stable and  improved.     Rosalyn Gess Norins, MD  Electronically Signed    MEN/MEDQ  D:  09/11/2008  T:  09/11/2008  Job:  [id number]   cc:   Gordy Savers, MD

## 2010-07-09 NOTE — Discharge Summary (Signed)
NAMEJACQUES, Corey Mcdonald                 ACCOUNT NO.:  [id number]   MEDICAL RECORD NO.:  [id number]          PATIENT TYPE:  INP   LOCATION:  4742                         FACILITY:  [facility]   PHYSICIAN:  Rene Paci, M.D. LHCDATE OF BIRTH:  1923-09-12   DATE OF ADMISSION:  05/27/2005  DATE OF DISCHARGE:  05/31/2005                                 DISCHARGE SUMMARY   DISCHARGE DIAGNOSES:  1.  Community acquired pneumonia.  2.  Cellulitis, right lower extremity.  3.  Atrial fibrillation with rapid ventricular response.   HISTORY OF PRESENT ILLNESS:  The patient is a 77 year old male with a 1 day  history of fever at home, which is accompanied by weakness and slight  unsteadiness on his feet. The patient was admitted for further evaluation  and treatment.   PAST MEDICAL HISTORY:  1.  Coronary artery disease status post coronary artery bypass grafting.  2.  Atrial flutter/paroxysmal atrial fibrillation.  3.  History of anemia.  4.  Macular degeneration.  5.  Hiatal hernia.  6.  Hypertension.  7.  Hyperlipidemia.   HOSPITAL COURSE BY PROBLEM:  #1.  COMMUNITY ACQUIRED PNEUMONIA:  The patient  was admitted and an x-ray was performed, which showed cardiomegaly with  vascular congestion and possible early basilar infiltrates. The patient was  started on azithromycin and IV Rocephin. This was later changed to p.o.  Ceftin. The patient is feeling much better at this time.   #2.  RIGHT LOWER EXTREMITY CELLULITIS:  The patient was noted to have some  erythema and swelling of the right lower extremity, anterior shin. This is  also improved with antibiotic therapy. A lower extremity Doppler was  performed, which was negative for deep vein thrombosis.   #3.  ATRIAL FIBRILLATION WITH RAPID VENTRICULAR RESPONSE:  The patient was  placed on a Cardizem drip for her rapid atrial fibrillation during this  admission. Heart rate returned to normal and is now stable on p.o. Cardizem.  However,  patient's INR is supra-therapeutic with a value of 3.5 on May 31, 2005. This is likely secondary to recent treatment with Zithromax, which  will be completed as of today. Plan to hold Coumadin tonight. The patient  and wife have been instructed and resume low-dose Coumadin tomorrow evening  of 2.5 with close outpatient followup. The patient is to speak with Dr.  Amador Cunas for a followup PT/INR.   LABORATORY DATA:  At discharge INR is 3.5, BUN 28, creatinine 1.4,  hemoglobin 12.4, hematocrit 35.8.   DISCHARGE MEDICATIONS:  1.  Ceftin 500 mg p.o. b.i.d. for 8 additional days.  2.  Coumadin, to be HELD on May 31, 2005; 2.5 mg p.o. June 01, 2005; then      to be dosed by Dr. Amador Cunas.  3.  Cardizem CD 240 mg p.o. daily.  4.  Toprol XL 25 mg p.o. daily.  5.  Crestor 10 mg daily.  6.  Multivitamin 1 tab p.o. daily.   FOLLOW UP:  The patient is instructed to followup with Amador Cunas on  Thursday or Friday of this week. At his followup  visit, he will need a  PT/INR performed.   SPECIAL INSTRUCTIONS:  He is also instructed to call Dr. Amador Cunas should  he develop shortness of breath; fever over 101; increasing pain, redness, or  swelling to the right leg.      Melissa S. Peggyann Juba, NP      Rene Paci, M.D. [facility]  Electronically Signed    MSO/MEDQ  D:  05/31/2005  T:  05/31/2005  Job:  [id number]   cc:   Gordy Savers, M.D. [facility]  [address] [place]  [place] [address]

## 2010-07-09 NOTE — Discharge Summary (Signed)
[place]  Patient:    Corey Mcdonald, Corey Mcdonald                          MRN: [id number] Adm. Date:  [id number] Disc. Date: [id number] Attending:  Loreli Dollar Dictator:   Corey Mcdonald, N.P. CC:         Corey Mcdonald, M.D.   Discharge Summary  ADMISSION DIAGNOSES: 1. New onset atrial flutter. 2. Coronary artery disease, status post coronary artery bypass grafting in    1997. 3. Hypertension. 4. Hyperlipidemia. 5. Sleep apnea with continuous positive airway pressure. 6. History of small bowel ischemia.  DISCHARGE DIAGNOSES:  1. New onset atrial flutter with nausea.  2. Coronary artery disease, status post coronary artery bypass grafting x 5     in 1997.  3. Hypertension.  4. Hypercholesterolemia.  5. Anemia.  6. Hiatal hernia with surgical repair.  7. Diverticular disease.  8. Blindness.  9. Sleep apnea with continuous positive airway pressure. 10. Peripheral vascular disease with claudication.  PROCEDURES: 1. Elective cardioversion. 2. Echocardiogram.  COMPLICATIONS:  None.  DISCHARGE STATUS:  Stable and improved.  ADMISSION HISTORY:  This is a 77 year old gentleman who apparently had a syncopal episode several days prior to this admission while walking with his wife.  They normally walk every day.  He apparently began feeling weak, light-headed, and had a syncopal episode that lasted approximately five minutes.  He denied any chest pain or shortness of breath with this episode. He denied any awareness of any arrhythmias or tachycardia.  He went to the [facility] Emergency Room as a result and was told he had "heat exhaustion."  He was sent home and was to follow up with Corey Mcdonald, M.D., in a few days.  On July 23, 2000, he was seen for follow-up per instructions at Corey Mcdonald.  Between his discharge from the emergency room and office visit with Corey Mcdonald apparently he developed some dysuria,  decreased appetite, and shaking chills.  He also was noted to have blood in his urine.  It was noted that he had Foley catheter insertion while he was in the ER at [facility].  While at Corey Mcdonald office, he was noted to be in atrial flutter with a rapid ventricular response with a heart rate of 140s-150s.  EMS was called for transport to the [facility] [employer] [facility] ER.  EMS established an IV and administered adenosine 6 mg and then 12 mg with no conversion in his rhythm.  He was also started on Septra DS for his UTI symptoms by Corey Mcdonald.  EMS also administered IV Lopressor en route to [facility] [facility].  He then had heart rate decrease into the 90s.  The patient was denying any complaint of chest pain, shortness of breath, or awareness of his arrhythmia.  He states that he just had a sensation of generalized weakness.  He continued to be in an atrial flutter, however, with a more controlled ventricular rate.  He was admitted to the hospital for rate control and rhythm conversion.  He was treated with IV Lopressor and elective cardioversion was planned for the next morning.  That same day, July 31, 2000, he had an echocardiogram which showed mild dilatation of the left atrium, mild TR, and mild LVH with normal function. The EF was calculated to be between 55-65%.  HOSPITAL COURSE:  The patient remained in atrial flutter,  however, still maintaining a controlled ventricular rate.  Heart rate ranging in the 60s-70s. He continued to deny chest pain or shortness of breath.  He did state that he had had a slight "pulsation" sensation to the occiput and some mild diaphoresis when he would notice his heart rate increasing.  Coumadin was started to reach a therapeutic INR goal between 2-3.  His IV Cardizem was changed to p.o. and Lopressor was again begun p.o. b.i.d.  Blood pressure remained stable.  Antibiotic therapy continued for UTI.  His Lescol  was discontinued due to elevated liver enzymes with an ALT of 76.  On August 02, 2000, he underwent elective cardioversion for atrial flutter.  The first attempt was at 20 watt seconds, which converted him to atrial fibrillation.  The second attempt at 200 watt seconds converted him to a normal sinus rhythm with a rate of 76.  He remained stable with anesthesia present.  He tolerated the procedure well.  He remained in the hospital the hospital rest of that day for anticipated discharge the next morning.  He was up to ambulate to monitor for rhythm and rate changes.  He ambulated in the halls without any increased heart rate or any shortness of breath, chest discomfort, or rhythm change.  Cardiac enzymes, as well as TSH during this admission were normal.  DISPOSITION:  On August 03, 2000, the patient was discharged home in stable condition in normal sinus rhythm.  DISCHARGE MEDICATIONS: 1. Toprol XL 25 mg p.o. q.d. 2. Cardizem CD 240 mg p.o. q.d. 3. Ocuvite q.d. 4. Coumadin 2.5 mg q.d. 5. Septra DS one.  Last dose tonight and then discontinue.  ACTIVITY:  As tolerated.  DIET:  He is to follow a low-fat, low-cholesterol, low-salt diet.  FOLLOW-UP:  He is to have a repeat PT and INR on Monday following his discharge.  He is to follow up with Corey Mcdonald, M.D., in three weeks.  He is to call for an appointment. DD:  08/29/00 TD:  08/29/00 Job: [id number]

## 2010-07-09 NOTE — Assessment & Plan Note (Signed)
[facility]                            [facility] OFFICE NOTE   NAME:Corey Mcdonald, Corey Mcdonald                          MRN:          [id number]  DATE:06/13/2006                            DOB:          10-29-1923    SUBJECTIVE:  An 77 year old gentleman who is seen today for an annual  exam.  He has a history of coronary artery disease, status post coronary  artery bypass graft surgery in 1997.  He has paroxysmal atrial flutter.  He has been followed by Dr. Beulah Gandy. Ashley Royalty for macular degeneration  and has legal blindness.  Additionally he has hypertension,  hypercholesterolemia and a history of sleep apnea.  He is followed for  colonic polyps, and has a remote history of peptic ulcer disease and  benign prostatic hypertrophy.  He is doing well today.   REVIEW OF SYSTEMS:  Noncontributory.   He had a negative Cardiolite stress test in January 2007.  Last  colonoscopy documented was in 2002, by Dr. Bernette Redbird.  He feels  that he has had one more recently, however.   FAMILY HISTORY:  Unchanged.  Positive for cardiac and cerebrovascular  disease.  A brother had Guillain/Barre syndrome.   PHYSICAL EXAMINATION:  GENERAL:  An elderly overweight male, in no acute  distress.  VITAL SIGNS:  Blood pressure 140/70.  HEENT:  Fundi, ears, nose, throat unremarkable.  NECK:  No bruits.  CHEST:  Clear.  CARDIOVASCULAR:  An irregular rhythm with a controlled ventricular  response of about 90.  ABDOMEN:  Obese, soft, nontender.  GENITOURINARY:  External genitalia normal.  RECTAL:  Prostate +3 and benign.  Stool heme-negative.  EXTREMITIES:  Reveal +2 edema.  Peripheral pulses were absent.  NEUROLOGIC:  Diminished vibratory sensation.   IMPRESSION:  1. Coronary artery disease.  2. Hypertension.  3. Dyslipidemia.  4. History of colonic polyps.   DISPOSITION:  Medical regimen unchanged.  Laboratory update will be  reviewed.  This will include an INR.  Will recheck  in three months.     Gordy Savers, MD  Electronically Signed   PFK/MedQ  DD: 06/13/2006  DT: 06/13/2006  Job #: [id number]

## 2010-07-09 NOTE — H&P (Signed)
NAME:  Corey Mcdonald, Corey Mcdonald                 ACCOUNT NO.:  [id number]   MEDICAL RECORD NO.:  [id number]          PATIENT TYPE:  INP   LOCATION:  4742                         FACILITY:  [facility]   PHYSICIAN:  Gordy Savers, M.D. LHCDATE OF BIRTH:  1923-06-23   DATE OF ADMISSION:  05/27/2005  DATE OF DISCHARGE:                                HISTORY & PHYSICAL   CHIEF COMPLAINT:  Fever.   HISTORY OF PRESENT ILLNESS:  Corey Mcdonald is a 78 year old male who presented  to the emergency department with complaints of fever.  He has a one day  history of having fevers at home.  He also complains of chills.  No night  sweats.  He also feels somewhat unsteady on his feet, occasionally feeling a  little lightheaded in the upright position.  He has no complaints of cough.  He has no complaints of dysuria, no hematuria, no urinary frequency.  He has  no known sick contacts.  He has had no rashes.  He has no complaints of a  sore throat.  He has had no diarrhea, no nausea or vomiting.   PAST MEDICAL HISTORY:  1.  Coronary artery disease status post coronary artery bypass graft.  2.  Hypertension.  3.  Hyperlipidemia.  4.  Hiatal hernia.  5.  Macular degeneration.  6.  Atrial flutter/paroxysmal atrial fibrillation.  7.  History of anemia.   CURRENT MEDICATIONS:  1.  Cardizem 240 mg p.o. daily.  2.  Coumadin 5 mg p.o. daily on Monday and Saturday, 2.5 mg p.o. daily all      other days of the week.  3.  Crestor 10 mg p.o. daily.  4.  Toprol XL 25 mg p.o. daily.  5.  Multivitamin one tablet p.o. every day.   ALLERGIES:  No known drug allergies.   SOCIAL HISTORY:  The patient is a former smoker and lives in [place] and  is married.   FAMILY HISTORY:  Father with a history of Guillain-Barre syndrome, brother  with history of coronary artery disease.   REVIEW OF SYSTEMS:  All systems reviewed in detailed and are negative except  as noted in the history of present illness.   PHYSICAL  EXAMINATION:  VITAL SIGNS:  Temperature 102.2, heart rate 106,  respiratory rate 20, oxygen saturation 92% on room air.  Blood pressure is  93/55.  GENERAL:  Obese male, alert and oriented x3, in no apparent distress.  HEENT:  Atraumatic, normocephalic.  Pupils equal, round and reactive to  light.  Extraocular movements intact.  Oropharynx clear.  NECK:  Supple.  No adenopathy.  No JVD.  No carotid bruits.  CHEST:  Slightly decreased breath sounds at the left base, otherwise clear  to auscultation bilaterally with equal bilateral breath sounds.  CORONARY:  Regular rhythm.  Normal rate.  Normal S1 and S2; 2/6 systolic  ejection murmur heard best at the right upper sternal border.  ABDOMEN:  Soft, nontender, nondistended.  Active bowel sounds.  No  hepatosplenomegaly.  EXTREMITIES:  No cyanosis, clubbing or edema.  NEUROLOGIC:  No focal deficits.  SKIN:  No rashes.  PSYCHIATRIC:  Normal affect.   LABORATORY DATA:  Available laboratory data:  Sodium 133, potassium 3.9,  chloride 102, bicarbonate 26, BUN 19, creatinine 1.5, glucose 120.  White  blood cell count 23,000 with 92% neutrophils, 6% lymphocytes, 6% monocytes.  Hematocrit 41.3, platelets 256,000.  Total bilirubin 1.0.  Alkaline  phosphatase 42, SGOT 26, SGPT 25.  Total protein 10.6, calcium 9.1, albumin  3.9, INR 3.0.  Blood cultures pending.  Urinalysis:  No leukocyte esterase,  no nitrites.  Portable chest x-ray shows a possible left lingular  infiltrate, the patient is status post sternotomy.   IMPRESSION AND PLAN:  The patient is a 77 year old male who presents with  fever, chest x-ray concerning for a community acquired pneumonia.   1.  Respiratory:  The patient received 1 g of Rocephin IV in the emergency      department.  We will continue Rocephin at 1 g IV q.24h., start him on      Azithromycin 500 mg p.o. x1 and then 250 mg p.o. daily.  Blood cultures      drawn in the emergency department.  Urine culture drawn in the  emergency      department.  We will get a sputum gram stain and culture.  We will place      the patient on albuterol inhaler p.r.n.  2.  Cardiovascular:  With the patient's marginal blood pressures, we will      hold his Toprol XL and his Cardizem.  We will continue him on his doses      of Coumadin and Crestor.  3.  Fluids, electrolytes and nutrition:  The patient is slightly      hyponatremic.  We will place the patient on a regular diet.  If his      blood pressures remain marginal, we will give him some IV fluids.  4.  Renal:  The patient's creatinine is 1.5 which we will follow closely.  5.  Neurologic:  No active issues.  6.  Prophylaxis:  The patient is on Coumadin, thus we will not place him on      DVT prophylaxis with subcu heparin.   PROBLEM LIST:  1.  Probable community-acquired pneumonia.  2.  Hyponatremia.  3.  History of coronary artery disease.  4.  History of hypertension.  5.  History of atrial flutter/paroxysmal atrial fibrillation.     ______________________________  Corey Mcdonald    ______________________________  Gordy Savers, M.D. [facility]    TK/MEDQ  D:  05/28/2005  T:  05/28/2005  Job:  [id number]

## 2010-07-09 NOTE — Procedures (Signed)
[facility]  Patient:    Corey Mcdonald, Corey Mcdonald                          MRN: [id number] Proc. Date: 07/07/00 Adm. Date:  [id number] Attending:  Rich Brave CC:         Gordy Savers, M.D.  Thereasa Solo. Little, M.D.   Procedure Report  PROCEDURE:  Colonoscopy.  INDICATION:  A 77 year old with prior history of colonic adenomas removed, for colonoscopic surveillance.  His last colonoscopy, five years ago, showed a small adenomatous polyp.  FINDINGS:  Negative exam.  Cecum not entirely visualized.  Left side diverticulosis.  INFORMED CONSENT:  The nature, purpose, and risks of the procedure were familiar to the patient who provided written consent.  SEDATION:  Fentanyl 62.5 mcg and Versed 5 mg IV without arrhythmias or desaturation.  DESCRIPTION OF PROCEDURE:  Digital exam of the prostate was normal.  The Olympus pediatric colonoscope was advanced to the level of the ileocecal valve but despite prolonged efforts and turning the patient in the supine and right lateral as well as left lateral decubitus positions, I could not get the tip of the scope to hook under the ileocecal valve and advance into the base of the cecum.  As a consequence, approximately 40% of the cecal surface area was not visualized during this procedure.  I even tried using the biopsy forceps to "tease" mucosa more up into the field of view but was unable to do so.  Pullback was therefore performed.  The patient had some left-sided diverticulosis but no polyps, cancer, colitis, or vascular malformations were seen (there was a question of a small cecal AVM noted on his last exam).  There was a little bit of stool film coating the cecal region which have obscured small lesions such as a small AVM as described above.  The patient tolerated the procedure well, and there were no apparent complications.  Retroflexion in the rectum was normal.  No biopsies were taken for  pathology but while "teasing" the cecal mucosa up, a small mucosal fragment was obtained which was discarded.  IMPRESSION: 1. No evidence of recurrent adenomatous polyps at this time. 2. Left-sided diverticulosis. 3. Incomplete examination of the cecum, not felt to likely be of clinical    significance.  PLAN:  Follow-up colonoscopy in about three years since the cecum was not fully visualized on this occasion. DD:  07/07/00 TD:  07/08/00 Job: [id number]

## 2010-07-09 NOTE — Procedures (Signed)
[place]  Patient:    Corey Mcdonald, Corey Mcdonald                          MRN: [id number] Proc. Date: 08/02/00 Adm. Date:  [id number] Attending:  Loreli Dollar CC:         Gordy Savers, M.D.                           Procedure Report  LOCATION:  Room 2014  INDICATIONS FOR PROCEDURE:  The patient is a 77 year old male, who was admitted with asymptomatic atrial flutter.  He had initial ventricular response of 150 with beta blockers that had slowed into the 70s but continued to accelerate with minimal activity.  This was somewhat complicated by the fact that he had a syncopal episode 4 days prior to admission, and following this episode was in a sinus rhythm; it was felt to be "heat" at that time.  DESCRIPTION OF PROCEDURE:  With anesthesia in attendance and given him 150 mg of sodium Pentothal, the patient was initially cardioverted with 20 watts seconds but stayed in atrial-fibrillation-flutter.  A total of 200 watt seconds resulted in the patient converting into sinus rhythm with a rate of 75.  He is moving all four extremities and is alert.  DISPOSITION:  The patient will be ambulated in the hall today and plan for discharge tomorrow. DD:  08/02/00 TD:  08/02/00 Job: [id number]

## 2010-07-16 ENCOUNTER — Ambulatory Visit: Payer: Medicare Other | Admitting: Internal Medicine

## 2010-07-29 ENCOUNTER — Other Ambulatory Visit: Payer: Self-pay | Admitting: Internal Medicine

## 2010-07-30 ENCOUNTER — Encounter: Payer: Self-pay | Admitting: Internal Medicine

## 2010-07-30 ENCOUNTER — Ambulatory Visit (INDEPENDENT_AMBULATORY_CARE_PROVIDER_SITE_OTHER): Payer: Medicare Other | Admitting: Internal Medicine

## 2010-07-30 VITALS — BP 118/62 | Temp 98.3°F | Wt 223.0 lb

## 2010-07-30 DIAGNOSIS — I4891 Unspecified atrial fibrillation: Secondary | ICD-10-CM

## 2010-07-30 DIAGNOSIS — Z8711 Personal history of peptic ulcer disease: Secondary | ICD-10-CM

## 2010-07-30 DIAGNOSIS — E785 Hyperlipidemia, unspecified: Secondary | ICD-10-CM

## 2010-07-30 DIAGNOSIS — D649 Anemia, unspecified: Secondary | ICD-10-CM

## 2010-07-30 DIAGNOSIS — I10 Essential (primary) hypertension: Secondary | ICD-10-CM

## 2010-07-30 LAB — CBC WITH DIFFERENTIAL/PLATELET
Basophils Relative: 0.4 % (ref 0.0–3.0)
Eosinophils Relative: 2 % (ref 0.0–5.0)
HCT: 40.5 % (ref 39.0–52.0)
Lymphs Abs: 1.5 10*3/uL (ref 0.7–4.0)
MCV: 91.9 fl (ref 78.0–100.0)
Monocytes Relative: 11.7 % (ref 3.0–12.0)
Platelets: 260 10*3/uL (ref 150.0–400.0)
RBC: 4.41 Mil/uL (ref 4.22–5.81)
WBC: 8.5 10*3/uL (ref 4.5–10.5)

## 2010-07-30 MED ORDER — ROSUVASTATIN CALCIUM 10 MG PO TABS: 10.0000 mg | ORAL_TABLET | Freq: Every day | ORAL | Status: DC

## 2010-07-30 NOTE — Progress Notes (Signed)
  Subjective:    Patient ID: Corey Mcdonald, male    DOB: [birth year], 76 y.o.   MRN: [id number]  HPI    77 year old patient who is seen today for followup. He has a history of paroxysmal atrial fibrillation and Coumadin anticoagulation has been on hold since earlier in the year when he was hospitalized for GI bleeding and anemia. He remains on proton pump inhibition. He has a history of coronary artery disease dyslipidemia and hypertension. He is doing quite well. No new concerns or complaints. Remains on Crestor 10 mg daily which he continues to tolerate well his blood pressure has been well-controlled. He denies any cardiopulmonary complaints.   Review of Systems  Constitutional: Negative for fever, chills, appetite change and fatigue.  HENT: Negative for hearing loss, ear pain, congestion, sore throat, trouble swallowing, neck stiffness, dental problem, voice change and tinnitus.   Eyes: Negative for pain, discharge and visual disturbance.  Respiratory: Negative for cough, chest tightness, wheezing and stridor.   Cardiovascular: Negative for chest pain, palpitations and leg swelling.  Gastrointestinal: Negative for nausea, vomiting, abdominal pain, diarrhea, constipation, blood in stool and abdominal distention.  Genitourinary: Negative for urgency, hematuria, flank pain, discharge, difficulty urinating and genital sores.  Musculoskeletal: Negative for myalgias, back pain, joint swelling, arthralgias and gait problem.  Skin: Negative for rash.  Neurological: Negative for dizziness, syncope, speech difficulty, weakness, numbness and headaches.  Hematological: Negative for adenopathy. Does not bruise/bleed easily.  Psychiatric/Behavioral: Negative for behavioral problems and dysphoric mood. The patient is not nervous/anxious.        Objective:   Physical Exam  Constitutional: He is oriented to person, place, and time. He appears well-developed.       Obese. Blood pressure 120/70  HENT:    Head: Normocephalic.  Right Ear: External ear normal.  Left Ear: External ear normal.  Eyes: Conjunctivae and EOM are normal.  Neck: Normal range of motion.  Cardiovascular: Normal rate, regular rhythm and normal heart sounds.        Rhythm is regular  Pulmonary/Chest: Breath sounds normal.  Abdominal: Bowel sounds are normal.  Musculoskeletal: Normal range of motion. He exhibits no edema and no tenderness.       Trace pedal edema  Neurological: He is alert and oriented to person, place, and time.  Psychiatric: He has a normal mood and affect. His behavior is normal.          Assessment & Plan:   Paroxysmal atrial fibrillation. We'll continue aspirin therapy at this time History GI bleeding peptic ulcer disease anemia. We'll check a CBC and continue proton pump inhibition Hypertension stable Coronary artery disease stable Dyslipidemia. Continues to tolerate Crestor History of AAA

## 2010-07-30 NOTE — Patient Instructions (Signed)
Limit your sodium (Salt) intake    It is important that you exercise regularly, at least 20 minutes 3 to 4 times per week.  If you develop chest pain or shortness of breath seek  medical attention.  You need to lose weight.  Consider a lower calorie diet and regular exercise.  Return in 3 months for follow-up  

## 2010-09-02 ENCOUNTER — Other Ambulatory Visit: Payer: Self-pay | Admitting: Internal Medicine

## 2010-09-02 MED ORDER — ROSUVASTATIN CALCIUM 10 MG PO TABS: 10.0000 mg | ORAL_TABLET | Freq: Every day | ORAL | Status: DC

## 2010-09-02 NOTE — Telephone Encounter (Signed)
Pt needs a refill called in on Crestor. [facility])

## 2010-09-02 NOTE — Telephone Encounter (Signed)
done

## 2010-10-04 ENCOUNTER — Telehealth: Payer: Self-pay | Admitting: Internal Medicine

## 2010-10-04 NOTE — Telephone Encounter (Signed)
Pt is needing samples of Crestor 10 mg or 5 mg.  Pls call when ready for pick up tomorrow a.m.

## 2010-10-04 NOTE — Telephone Encounter (Signed)
No samples avilb at this time - check back later this week

## 2010-10-05 ENCOUNTER — Encounter: Payer: Self-pay | Admitting: Vascular Surgery

## 2010-10-05 ENCOUNTER — Other Ambulatory Visit: Payer: Self-pay | Admitting: Vascular Surgery

## 2010-10-05 ENCOUNTER — Ambulatory Visit (INDEPENDENT_AMBULATORY_CARE_PROVIDER_SITE_OTHER): Payer: Medicare Other | Admitting: Vascular Surgery

## 2010-10-05 ENCOUNTER — Encounter (INDEPENDENT_AMBULATORY_CARE_PROVIDER_SITE_OTHER): Payer: Medicare Other

## 2010-10-05 VITALS — BP 155/80 | HR 58 | Temp 97.9°F | Ht 71.0 in | Wt 219.0 lb

## 2010-10-05 DIAGNOSIS — I714 Abdominal aortic aneurysm, without rupture: Secondary | ICD-10-CM

## 2010-10-05 NOTE — Progress Notes (Signed)
Subjective:     Patient ID: Corey Mcdonald, male   DOB: [birth year], 77 y.o.   MRN: [id number]  HPI The patient presents today for followup of his asymptomatic abdominal aortic aneurysm. He was last seen in January 2012 with an ultrasound showing a 5.5 cm aneurysm. He is here today with his granddaughter. He has had no new cardiac pulmonary or other medical changes since my last visit with him.  Review of Systems  Constitutional: Negative for fever and chills.  Respiratory: Negative for chest tightness and shortness of breath.    Past Medical History  Diagnosis Date  . ABDOMINAL AORTIC ANEURYSM 09/15/2008  . Atrial fibrillation 10/10/2006  . Atrial flutter 08/18/2006  . BENIGN PROSTATIC HYPERTROPHY, HX OF 08/18/2006  . COLONIC POLYPS, HX OF 08/18/2006  . HYPERLIPIDEMIA 12/04/2009  . HYPERTENSION 08/18/2006  . HYPOTENSION 09/04/2008  . MACULAR DEGENERATION 08/18/2006  . PEPTIC ULCER DISEASE, HX OF 08/18/2006  . RESTLESS LEGS SYNDROME 12/04/2009  . SLEEP APNEA 08/18/2006  . VERTIGO 07/28/2008  . WEAKNESS 06/23/2008  . CORONARY ARTERY DISEASE     non-ST segment MI, March 15, 2010  . Anemia January 2012    admitted with acute MI March 15, 2010 and has significant acute blood loss anemia requiring transfusions of two units of packed RBCs.  Status post upper endoscopy March 22, 2010 without High-Risk bleeding lesion.  History of peptic ulcer disease    History  Substance Use Topics  . Smoking status: Former Smoker -- 40 years    Types: Cigarettes    Quit date: 02/22/1983  . Smokeless tobacco: Never Used  . Alcohol Use: No    Family History  Problem Relation Age of Onset  . Heart disease Mother   . Heart disease Father   . Heart disease Sister   . Macular degeneration Sister   . Heart disease Sister     Allergies  Allergen Reactions  . Cilostazol     Current outpatient prescriptions:acetaminophen (TYLENOL) 325 MG tablet, Take 650 mg by mouth every 4 (four) hours as needed.  ,  Disp: , Rfl: ;  aspirin 81 MG tablet, Take 81 mg by mouth daily.  , Disp: , Rfl: ;  diltiazem (CARDIZEM CD) 180 MG 24 hr capsule, TAKE ONE CAPSULE BY MOUTH EVERY DAY, Disp: 90 capsule, Rfl: 0;  docusate sodium (COLACE) 100 MG capsule, Take 100 mg by mouth 2 (two) times daily.  , Disp: , Rfl:  hydrochlorothiazide 25 MG tablet, Take 25 mg by mouth. M,W,F , Disp: , Rfl: ;  metoprolol (LOPRESSOR) 25 MG tablet, Take 25 mg by mouth 2 (two) times daily.  , Disp: , Rfl: ;  Multiple Vitamin (MULTIVITAMIN) tablet, Take 1 tablet by mouth daily.  , Disp: , Rfl: ;  nitroGLYCERIN (NITROSTAT) 0.4 MG SL tablet, Place 0.4 mg under the tongue every 5 (five) minutes as needed.  , Disp: , Rfl:  pantoprazole (PROTONIX) 40 MG tablet, Take 40 mg by mouth 2 (two) times daily.  , Disp: , Rfl: ;  rosuvastatin (CRESTOR) 10 MG tablet, Take 1 tablet (10 mg total) by mouth daily., Disp: 90 tablet, Rfl: 2;  carbamide peroxide (DEBROX) 6.5 % otic solution, 5 drops 2 (two) times daily.  , Disp: , Rfl: ;  carbidopa-levodopa (SINEMET CR) 25-100 MG per tablet, Take by mouth. 1/2 tablet at bedtime , Disp: , Rfl:  ferrous sulfate 325 (65 FE) MG EC tablet, Take 325 mg by mouth 2 (two) times daily with meals.  , Disp: ,  Rfl: ;  folic acid (FOLVITE) 1 MG tablet, Take 1 mg by mouth daily.  , Disp: , Rfl:   Filed Vitals:   10/05/10 0942  Height: 5\' 11"  (1.803 m)  Weight: 219 lb (99.338 kg)    Body mass index is 30.54 kg/(m^2).           Objective:   Physical Exam Well-developed well-nourished white male in no acute distress. Abdomen soft nontender no palpable masses and no aneurysm palpated. Moderate obesity. palpable femoral and absent popliteal and distal pulses. Neuro alert oriented no deficit   abdominal ultrasound: Increased maximal size of the aorta up to 6.0 cm    Assessment:     Enlarging abdominal aortic aneurysm, asymptomatic. I discussed this at length with the patient and his family present. Due to an enlarging aneurysm  I have recommended CAT scan for further evaluation. I discussed open and endovascular repair of his aneurysm.    Plan:     Abdominal and pelvic CT scan and office visit followup

## 2010-10-06 ENCOUNTER — Other Ambulatory Visit: Payer: Self-pay | Admitting: Internal Medicine

## 2010-10-06 DIAGNOSIS — I714 Abdominal aortic aneurysm, without rupture: Secondary | ICD-10-CM

## 2010-10-08 ENCOUNTER — Other Ambulatory Visit (INDEPENDENT_AMBULATORY_CARE_PROVIDER_SITE_OTHER): Payer: Medicare Other

## 2010-10-08 ENCOUNTER — Other Ambulatory Visit: Payer: Medicare Other

## 2010-10-08 ENCOUNTER — Ambulatory Visit: Payer: Medicare Other | Admitting: Internal Medicine

## 2010-10-08 ENCOUNTER — Telehealth: Payer: Self-pay | Admitting: *Deleted

## 2010-10-08 DIAGNOSIS — I714 Abdominal aortic aneurysm, without rupture: Secondary | ICD-10-CM

## 2010-10-08 NOTE — Telephone Encounter (Signed)
Please send a copy of labs to Dr. Karlene Lineman at fax [phone] and Dr. Clarene Duke at fax [phone]. Thanks

## 2010-10-13 NOTE — Telephone Encounter (Signed)
Labs faxed as requested

## 2010-10-15 NOTE — Procedures (Unsigned)
DUPLEX ULTRASOUND OF ABDOMINAL AORTA  INDICATION:  Followup known AAA.  HISTORY: Diabetes: Cardiac:  Yes. Hypertension:  Yes. Smoking: Connective Tissue Disorder: Family History: Previous Surgery:  No.  DUPLEX EXAM:         AP (cm)                   TRANSVERSE (cm) Proximal             3.5 cm                    3.6 cm Mid                  5.6 cm                    6.0 cm Distal               2.7 cm                    2.7 cm Right Iliac          1.05 cm                   Cm Left Iliac           1.05 cm                   cm  PREVIOUS:  Date:  03/19/2010 (by CT)  AP:  5.4  TRANSVERSE:  5.5  IMPRESSION: 1. Abdominal aortic aneurysm measuring 5.6 cm x 6.0 cm on today's exam     which is slightly larger compared to prior scan 6 months ago. 2. Iliac arteries could not be visualized in their entirety due to     bowel gas, diameter may be higher than recorded.  ___________________________________________ Larina Earthly, M.D.  LT/MEDQ  D:  10/05/2010  T:  10/05/2010  Job:  [id number]

## 2010-10-18 ENCOUNTER — Encounter: Payer: Self-pay | Admitting: Vascular Surgery

## 2010-10-19 ENCOUNTER — Ambulatory Visit (INDEPENDENT_AMBULATORY_CARE_PROVIDER_SITE_OTHER): Payer: Medicare Other | Admitting: Vascular Surgery

## 2010-10-19 ENCOUNTER — Ambulatory Visit
Admission: RE | Admit: 2010-10-19 | Discharge: 2010-10-19 | Disposition: A | Discharge status: Home / Self Care | Payer: Medicare Other | Source: Ambulatory Visit | Attending: Vascular Surgery | Admitting: Vascular Surgery

## 2010-10-19 ENCOUNTER — Encounter: Payer: Self-pay | Admitting: Vascular Surgery

## 2010-10-19 VITALS — BP 158/80 | HR 63 | Ht 71.0 in | Wt 219.0 lb

## 2010-10-19 DIAGNOSIS — I714 Abdominal aortic aneurysm, without rupture: Secondary | ICD-10-CM

## 2010-10-19 MED ORDER — IOHEXOL 350 MG/ML SOLN: 80.0000 mL | Freq: Once | INTRAVENOUS | Status: AC | PRN

## 2010-10-19 NOTE — Progress Notes (Signed)
Subjective:     Patient ID: Corey Mcdonald, male   DOB: 07/12/1923, 77 y.o.   MRN: [id number]  HPI The patient presents today for discussion regarding a CT scan of his abdomen aortic aneurysm. Here today with his son and granddaughter. Ultrasound 2 weeks ago had shown progression in size and he has had a CT scan today for further evaluation. He has no change since in his history since last 2 weeks.  Review of Systems No change    Objective:   Physical Exam Well-developed alert moderately obese white male in no acute distress. Heart regular rate and rhythm. Chest clear bilaterally. Abdomen obese I cannot palpate an aneurysm. He has a palpable femoral pulses and absent popliteal and distal pulses.  CT scan: Infrarenal abdominal aortic aneurysm 6 cm in size. Significantly length to his infrarenal aortic neck. The aneurysm stopped at the aortic bifurcation. There is moderate iliac calcification with normal iliac size bilaterally.    Assessment:     Enlarging 6 cm aneurysm. I have recommended elective repair.    Plan:     We will arrange followup for a cardiac evaluation by Dr. Caprice Kluver. Following cardiac clearance we will arrange elective stent graft repair of abdominal aortic aneurysm he understands the procedure risks and required long-term followup.

## 2010-10-28 ENCOUNTER — Other Ambulatory Visit: Payer: Self-pay | Admitting: Internal Medicine

## 2010-10-29 ENCOUNTER — Encounter: Payer: Self-pay | Admitting: Internal Medicine

## 2010-10-29 ENCOUNTER — Ambulatory Visit (INDEPENDENT_AMBULATORY_CARE_PROVIDER_SITE_OTHER): Payer: Medicare Other | Admitting: Internal Medicine

## 2010-10-29 DIAGNOSIS — I2510 Atherosclerotic heart disease of native coronary artery without angina pectoris: Secondary | ICD-10-CM

## 2010-10-29 DIAGNOSIS — I10 Essential (primary) hypertension: Secondary | ICD-10-CM

## 2010-10-29 DIAGNOSIS — I4891 Unspecified atrial fibrillation: Secondary | ICD-10-CM

## 2010-10-29 NOTE — Patient Instructions (Signed)
Limit your sodium (Salt) intake  You need to lose weight.  Consider a lower calorie diet and regular exercise. Return in 3 months for follow-up  

## 2010-10-29 NOTE — Progress Notes (Signed)
  Subjective:    Patient ID: Corey Mcdonald, male    DOB: [birth year], 77 y.o.   MRN: [id number]  HPI  77 year old patient who has a history of hypertension paroxysmal atrial flutter ablation dyslipidemia. He has restless leg syndrome in general he has done quite well no new concerns or complaints. His cardiopulmonary status has been stable no palpitations chest pain or shortness of breath. His restless leg syndrome seems stable and well controlled on his present regimen    Review of Systems  Constitutional: Negative for fever, chills, appetite change and fatigue.  HENT: Negative for hearing loss, ear pain, congestion, sore throat, trouble swallowing, neck stiffness, dental problem, voice change and tinnitus.   Eyes: Negative for pain, discharge and visual disturbance.  Respiratory: Negative for cough, chest tightness, wheezing and stridor.   Cardiovascular: Negative for chest pain, palpitations and leg swelling.  Gastrointestinal: Negative for nausea, vomiting, abdominal pain, diarrhea, constipation, blood in stool and abdominal distention.  Genitourinary: Negative for urgency, hematuria, flank pain, discharge, difficulty urinating and genital sores.  Musculoskeletal: Negative for myalgias, back pain, joint swelling, arthralgias and gait problem.  Skin: Negative for rash.  Neurological: Negative for dizziness, syncope, speech difficulty, weakness, numbness and headaches.  Hematological: Negative for adenopathy. Does not bruise/bleed easily.  Psychiatric/Behavioral: Negative for behavioral problems and dysphoric mood. The patient is not nervous/anxious.        Objective:   Physical Exam  Constitutional: He is oriented to person, place, and time. He appears well-developed.       Overweight. Blood pressure 120/70  HENT:  Head: Normocephalic.  Right Ear: External ear normal.  Left Ear: External ear normal.  Eyes: Conjunctivae and EOM are normal.  Neck: Normal range of motion.    Cardiovascular: Normal rate, regular rhythm and normal heart sounds.   Pulmonary/Chest: Breath sounds normal.  Abdominal: Bowel sounds are normal.  Musculoskeletal: Normal range of motion. He exhibits no edema and no tenderness.  Neurological: He is alert and oriented to person, place, and time.  Psychiatric: He has a normal mood and affect. His behavior is normal.          Assessment & Plan:   Restless leg syndrome stable Paroxysmal atrial fibrillation. Remains in normal sinus rhythm we'll continue daily aspirin Dyslipidemia Hypertension well controlled

## 2010-11-26 ENCOUNTER — Ambulatory Visit (INDEPENDENT_AMBULATORY_CARE_PROVIDER_SITE_OTHER): Payer: Medicare Other

## 2010-11-26 DIAGNOSIS — Z23 Encounter for immunization: Secondary | ICD-10-CM

## 2010-12-17 ENCOUNTER — Other Ambulatory Visit: Payer: Self-pay | Admitting: Vascular Surgery

## 2010-12-17 ENCOUNTER — Encounter (HOSPITAL_COMMUNITY)
Admission: RE | Admit: 2010-12-17 | Discharge: 2010-12-17 | Disposition: A | Discharge status: Home / Self Care | Payer: Medicare Other | Source: Ambulatory Visit | Attending: Vascular Surgery | Admitting: Vascular Surgery

## 2010-12-17 ENCOUNTER — Ambulatory Visit (HOSPITAL_COMMUNITY)
Admission: RE | Admit: 2010-12-17 | Discharge: 2010-12-17 | Disposition: A | Discharge status: Home / Self Care | Payer: Medicare Other | Source: Ambulatory Visit | Attending: Vascular Surgery | Admitting: Vascular Surgery

## 2010-12-17 DIAGNOSIS — Z01818 Encounter for other preprocedural examination: Secondary | ICD-10-CM | POA: Insufficient documentation

## 2010-12-17 DIAGNOSIS — Z01810 Encounter for preprocedural cardiovascular examination: Secondary | ICD-10-CM | POA: Insufficient documentation

## 2010-12-17 DIAGNOSIS — I714 Abdominal aortic aneurysm, without rupture: Secondary | ICD-10-CM

## 2010-12-17 DIAGNOSIS — Z01812 Encounter for preprocedural laboratory examination: Secondary | ICD-10-CM | POA: Insufficient documentation

## 2010-12-17 LAB — COMPREHENSIVE METABOLIC PANEL
AST: 15 U/L (ref 0–37)
CO2: 23 mEq/L (ref 19–32)
Chloride: 103 mEq/L (ref 96–112)
Creatinine, Ser: 1.14 mg/dL (ref 0.50–1.35)
GFR calc Af Amer: 66 mL/min — ABNORMAL LOW (ref >90)
GFR calc non Af Amer: 57 mL/min — ABNORMAL LOW (ref >90)
Glucose, Bld: 104 mg/dL — ABNORMAL HIGH (ref 70–99)
Total Bilirubin: 0.3 mg/dL (ref 0.3–1.2)

## 2010-12-17 LAB — BLOOD GAS, ARTERIAL
Bicarbonate: 23.7 mEq/L (ref 20.0–24.0)
FIO2: 0.21 %
O2 Saturation: 96.1 %
Patient temperature: 98.6

## 2010-12-17 LAB — URINE MICROSCOPIC-ADD ON

## 2010-12-17 LAB — APTT: aPTT: 30 seconds (ref 24–37)

## 2010-12-17 LAB — CBC
HCT: 39.7 % (ref 39.0–52.0)
Hemoglobin: 13.3 g/dL (ref 13.0–17.0)
MCV: 90.4 fL (ref 78.0–100.0)
Platelets: 263 10*3/uL (ref 150–400)
RBC: 4.39 MIL/uL (ref 4.22–5.81)
WBC: 8.9 10*3/uL (ref 4.0–10.5)

## 2010-12-17 LAB — URINALYSIS, ROUTINE W REFLEX MICROSCOPIC
Glucose, UA: NEGATIVE mg/dL
Hgb urine dipstick: NEGATIVE
Specific Gravity, Urine: 1.011 (ref 1.005–1.030)
Urobilinogen, UA: 0.2 mg/dL (ref 0.0–1.0)
pH: 7 (ref 5.0–8.0)

## 2010-12-17 LAB — TYPE AND SCREEN: Antibody Screen: NEGATIVE

## 2010-12-17 NOTE — Pre-Procedure Instructions (Signed)
22 Corey Mcdonald  12/17/2010   Your procedure is scheduled on:  Fri, Nov 2   Report to [facility] at [phone].  Call this number if you have problems the morning of surgery: [phone]  Remember:   Do not eat food:After Midnight.midnight   Do not drink clear liquids: 4 Hours before arrival.  Take these medicines the morning of surgery with A SIP OF WATER: Metoprolol and pantoprazole   Do not wear jewelry, make-up or nail polish.  Do not wear lotions, powders, or perfumes. You may wear deodorant.  Do not shave 48 hours prior to surgery.  Do not bring valuables to the hospital.  Contacts, dentures or bridgework may not be worn into surgery.  Leave suitcase in the car. After surgery it may be brought to your room.  For patients admitted to the hospital, checkout time is 11:00 AM the day of discharge.   Patients discharged the day of surgery will not be allowed to drive home.  Name and phone number of your driver: amy [phone]  Special Instructions: CHG Shower Use Special Wash: 1/2 bottle night before surgery and 1/2 bottle morning of surgery.   Please read over the following fact sheets that you were given: Pain Booklet, Coughing and Deep Breathing, Blood Transfusion Information and Surgical Site Infection Prevention

## 2010-12-20 ENCOUNTER — Ambulatory Visit (HOSPITAL_COMMUNITY): Admission: RE | Admit: 2010-12-20 | Payer: Medicare Other | Source: Ambulatory Visit

## 2010-12-20 MED ORDER — DEXTROSE 5 % IV SOLN: 1.5000 g | Freq: Once | INTRAVENOUS | Status: DC

## 2010-12-24 ENCOUNTER — Inpatient Hospital Stay (HOSPITAL_COMMUNITY): Payer: Medicare Other

## 2010-12-24 ENCOUNTER — Inpatient Hospital Stay (HOSPITAL_COMMUNITY)
Admission: RE | Admit: 2010-12-24 | Discharge: 2010-12-25 | DRG: 238 | Disposition: A | Discharge status: Home / Self Care | Payer: Medicare Other | Source: Ambulatory Visit | Attending: Vascular Surgery | Admitting: Vascular Surgery

## 2010-12-24 DIAGNOSIS — H3530 Unspecified macular degeneration: Secondary | ICD-10-CM | POA: Diagnosis present

## 2010-12-24 DIAGNOSIS — Z860100 Personal history of colon polyps, unspecified: Secondary | ICD-10-CM

## 2010-12-24 DIAGNOSIS — N400 Enlarged prostate without lower urinary tract symptoms: Secondary | ICD-10-CM | POA: Diagnosis present

## 2010-12-24 DIAGNOSIS — I714 Abdominal aortic aneurysm, without rupture, unspecified: Principal | ICD-10-CM | POA: Diagnosis present

## 2010-12-24 DIAGNOSIS — Z87891 Personal history of nicotine dependence: Secondary | ICD-10-CM

## 2010-12-24 DIAGNOSIS — I10 Essential (primary) hypertension: Secondary | ICD-10-CM | POA: Diagnosis present

## 2010-12-24 DIAGNOSIS — Z7982 Long term (current) use of aspirin: Secondary | ICD-10-CM

## 2010-12-24 DIAGNOSIS — G2581 Restless legs syndrome: Secondary | ICD-10-CM | POA: Diagnosis present

## 2010-12-24 DIAGNOSIS — D649 Anemia, unspecified: Secondary | ICD-10-CM | POA: Diagnosis present

## 2010-12-24 DIAGNOSIS — I4892 Unspecified atrial flutter: Secondary | ICD-10-CM | POA: Diagnosis present

## 2010-12-24 DIAGNOSIS — E785 Hyperlipidemia, unspecified: Secondary | ICD-10-CM | POA: Diagnosis present

## 2010-12-24 DIAGNOSIS — I4891 Unspecified atrial fibrillation: Secondary | ICD-10-CM | POA: Diagnosis present

## 2010-12-24 DIAGNOSIS — I252 Old myocardial infarction: Secondary | ICD-10-CM

## 2010-12-24 DIAGNOSIS — I2510 Atherosclerotic heart disease of native coronary artery without angina pectoris: Secondary | ICD-10-CM | POA: Diagnosis present

## 2010-12-24 LAB — PROTIME-INR: INR: 1.1 (ref 0.00–1.49)

## 2010-12-24 LAB — BASIC METABOLIC PANEL
BUN: 17 mg/dL (ref 6–23)
Chloride: 104 mEq/L (ref 96–112)
Creatinine, Ser: 1.13 mg/dL (ref 0.50–1.35)
GFR calc Af Amer: 66 mL/min — ABNORMAL LOW (ref >90)

## 2010-12-24 LAB — CBC
HCT: 33.7 % — ABNORMAL LOW (ref 39.0–52.0)
MCV: 91.8 fL (ref 78.0–100.0)
RBC: 3.67 MIL/uL — ABNORMAL LOW (ref 4.22–5.81)
WBC: 9.9 10*3/uL (ref 4.0–10.5)

## 2010-12-24 LAB — APTT: aPTT: 32 s (ref 24–37)

## 2010-12-24 LAB — MAGNESIUM: Magnesium: 1.8 mg/dL (ref 1.5–2.5)

## 2010-12-25 LAB — CBC
HCT: 33.2 % — ABNORMAL LOW (ref 39.0–52.0)
Hemoglobin: 11.1 g/dL — ABNORMAL LOW (ref 13.0–17.0)
MCH: 30.5 pg (ref 26.0–34.0)
MCHC: 33.4 g/dL (ref 30.0–36.0)

## 2010-12-25 LAB — BASIC METABOLIC PANEL
BUN: 14 mg/dL (ref 6–23)
Calcium: 9.1 mg/dL (ref 8.4–10.5)
GFR calc non Af Amer: 56 mL/min — ABNORMAL LOW (ref >90)
Glucose, Bld: 151 mg/dL — ABNORMAL HIGH (ref 70–99)
Sodium: 138 mEq/L (ref 135–145)

## 2010-12-25 MED ORDER — PANTOPRAZOLE SODIUM 40 MG PO TBEC: 40.0000 mg | DELAYED_RELEASE_TABLET | Freq: Every day | ORAL | Status: DC

## 2010-12-25 MED ORDER — DOCUSATE SODIUM 100 MG PO CAPS: 100.0000 mg | ORAL_CAPSULE | Freq: Every day | ORAL | Status: DC

## 2010-12-25 MED ORDER — TRAMADOL HCL 50 MG PO TABS: 50.0000 mg | ORAL_TABLET | Freq: Four times a day (QID) | ORAL | Status: DC | PRN

## 2010-12-25 MED ORDER — ASPIRIN EC 81 MG PO TBEC
81.0000 mg | DELAYED_RELEASE_TABLET | Freq: Every day | ORAL | Status: DC
  Filled 2010-12-25 (×2): qty 1

## 2010-12-25 MED ORDER — ZOLPIDEM TARTRATE 5 MG PO TABS: 5.0000 mg | ORAL_TABLET | Freq: Every evening | ORAL | Status: DC | PRN

## 2010-12-25 MED ORDER — ONDANSETRON HCL 4 MG/2ML IJ SOLN: 4.0000 mg | Freq: Four times a day (QID) | INTRAMUSCULAR | Status: DC | PRN

## 2010-12-25 MED ORDER — HYDRALAZINE HCL 20 MG/ML IJ SOLN
10.0000 mg | INTRAMUSCULAR | Status: DC | PRN
  Filled 2010-12-25: qty 0.5

## 2010-12-25 MED ORDER — OXYCODONE HCL 5 MG PO TABS: 5.0000 mg | ORAL_TABLET | ORAL | Status: DC | PRN

## 2010-12-25 MED ORDER — LABETALOL HCL 5 MG/ML IV SOLN: 10.0000 mg | INTRAVENOUS | Status: DC | PRN

## 2010-12-25 MED ORDER — DILTIAZEM HCL ER COATED BEADS 180 MG PO CP24
180.0000 mg | ORAL_CAPSULE | Freq: Every day | ORAL | Status: DC
  Filled 2010-12-25 (×2): qty 1

## 2010-12-25 MED ORDER — ALUM & MAG HYDROXIDE-SIMETH 200-200-20 MG/5ML PO SUSP: 15.0000 mL | ORAL | Status: DC | PRN

## 2010-12-25 MED ORDER — GUAIFENESIN-DM 100-10 MG/5ML PO SYRP
15.0000 mL | ORAL_SOLUTION | ORAL | Status: DC | PRN
Start: 2010-12-25 — End: 2010-12-25

## 2010-12-25 MED ORDER — THERA M PLUS PO TABS
1.0000 | ORAL_TABLET | Freq: Every day | ORAL | Status: DC
  Filled 2010-12-25 (×2): qty 1

## 2010-12-25 MED ORDER — HYDROCHLOROTHIAZIDE 12.5 MG PO CAPS: 12.5000 mg | ORAL_CAPSULE | ORAL | Status: DC

## 2010-12-25 MED ORDER — ACETAMINOPHEN 325 MG PO TABS: 325.0000 mg | ORAL_TABLET | ORAL | Status: DC | PRN

## 2010-12-25 MED ORDER — ENOXAPARIN SODIUM 40 MG/0.4ML ~~LOC~~ SOLN
40.0000 mg | SUBCUTANEOUS | Status: DC
  Filled 2010-12-25: qty 0.4

## 2010-12-25 MED ORDER — METOPROLOL TARTRATE 25 MG PO TABS
25.0000 mg | ORAL_TABLET | Freq: Two times a day (BID) | ORAL | Status: DC
  Filled 2010-12-25 (×4): qty 1

## 2010-12-25 MED ORDER — DOPAMINE-DEXTROSE 3.2-5 MG/ML-% IV SOLN: 3.0000 ug/kg/min | INTRAVENOUS | Status: DC

## 2010-12-25 MED ORDER — METOPROLOL TARTRATE 1 MG/ML IV SOLN: 2.0000 mg | INTRAVENOUS | Status: DC | PRN

## 2010-12-25 MED ORDER — ROSUVASTATIN CALCIUM 10 MG PO TABS
10.0000 mg | ORAL_TABLET | Freq: Every day | ORAL | Status: DC
  Filled 2010-12-25 (×2): qty 1

## 2010-12-25 MED ORDER — DEXTROSE 5 % IV SOLN
1.5000 g | Freq: Two times a day (BID) | INTRAVENOUS | Status: DC
  Filled 2010-12-25 (×3): qty 1.5

## 2010-12-25 MED ORDER — MAGNESIUM HYDROXIDE 400 MG/5ML PO SUSP: 30.0000 mL | ORAL | Status: DC | PRN

## 2010-12-25 MED ORDER — NITROGLYCERIN 0.4 MG SL SUBL: 0.4000 mg | SUBLINGUAL_TABLET | SUBLINGUAL | Status: DC | PRN

## 2010-12-25 MED ORDER — BISACODYL 10 MG RE SUPP: 10.0000 mg | Freq: Every day | RECTAL | Status: DC | PRN

## 2010-12-25 MED ORDER — POLYETHYLENE GLYCOL 3350 17 G PO PACK
17.0000 g | PACK | Freq: Every day | ORAL | Status: DC | PRN
  Filled 2010-12-25: qty 1

## 2010-12-25 MED ORDER — DEXTROSE-NACL 5-0.9 % IV SOLN
INTRAVENOUS | Status: DC
  Filled 2010-12-25 (×3): qty 1000

## 2010-12-25 MED ORDER — PHENOL 1.4 % MT LIQD: 1.0000 | OROMUCOSAL | Status: DC | PRN

## 2010-12-25 MED ORDER — ACETAMINOPHEN 650 MG RE SUPP: 650.0000 mg | RECTAL | Status: DC | PRN

## 2010-12-25 MED ORDER — MORPHINE SULFATE 2 MG/ML IJ SOLN: 2.0000 mg | INTRAMUSCULAR | Status: DC | PRN

## 2010-12-27 ENCOUNTER — Other Ambulatory Visit: Payer: Self-pay | Admitting: *Deleted

## 2010-12-27 DIAGNOSIS — I714 Abdominal aortic aneurysm, without rupture: Secondary | ICD-10-CM

## 2011-01-20 ENCOUNTER — Other Ambulatory Visit: Payer: Self-pay | Admitting: Internal Medicine

## 2011-01-24 ENCOUNTER — Encounter: Payer: Self-pay | Admitting: Vascular Surgery

## 2011-01-25 ENCOUNTER — Ambulatory Visit
Admission: RE | Admit: 2011-01-25 | Discharge: 2011-01-25 | Disposition: A | Discharge status: Home / Self Care | Payer: Medicare Other | Source: Ambulatory Visit | Attending: Vascular Surgery | Admitting: Vascular Surgery

## 2011-01-25 ENCOUNTER — Ambulatory Visit (INDEPENDENT_AMBULATORY_CARE_PROVIDER_SITE_OTHER): Payer: Medicare Other | Admitting: Vascular Surgery

## 2011-01-25 ENCOUNTER — Encounter: Payer: Self-pay | Admitting: Vascular Surgery

## 2011-01-25 VITALS — BP 151/62 | HR 65 | Resp 16 | Ht 71.0 in | Wt 221.0 lb

## 2011-01-25 DIAGNOSIS — I714 Abdominal aortic aneurysm, without rupture: Secondary | ICD-10-CM

## 2011-01-25 MED ORDER — IOHEXOL 350 MG/ML SOLN: 100.0000 mL | Freq: Once | INTRAVENOUS | Status: AC | PRN

## 2011-01-25 NOTE — Progress Notes (Signed)
The patient presents today for followup of his stent graft repair of an aortic aneurysm on 12/27/2010 he did well discharged home on postoperative day #1. He is continued to do well since surgery and returned to his baseline function. He had some mild soreness in his groin incisions and this is resolved completely. He is returned to normal bowel function and normal appetite. He does have some known chronic swelling in his lower extremities.  Physical exam well-healed groin incisions bilaterally. No palpable aortic aneurysm with abdominal obesity.  CT abdomen and pelvis: Excellent positioning of stent graft with no evidence of endoleak.  Impression and plan stable one month status post stent graft repair of abdominal aortic aneurysm. The patient will be seen in 6 months with repeat CT followup in office visit

## 2011-02-23 ENCOUNTER — Telehealth: Payer: Self-pay | Admitting: Internal Medicine

## 2011-02-23 NOTE — Telephone Encounter (Signed)
No samples avilb. At this time

## 2011-02-23 NOTE — Telephone Encounter (Signed)
Requesting Crestor 10 mg samples. Wants to pick up this afternoon.

## 2011-03-11 ENCOUNTER — Encounter: Payer: Self-pay | Admitting: Internal Medicine

## 2011-03-11 ENCOUNTER — Ambulatory Visit (INDEPENDENT_AMBULATORY_CARE_PROVIDER_SITE_OTHER): Payer: Medicare Other | Admitting: Internal Medicine

## 2011-03-11 DIAGNOSIS — I4891 Unspecified atrial fibrillation: Secondary | ICD-10-CM

## 2011-03-11 DIAGNOSIS — I10 Essential (primary) hypertension: Secondary | ICD-10-CM

## 2011-03-11 DIAGNOSIS — I2510 Atherosclerotic heart disease of native coronary artery without angina pectoris: Secondary | ICD-10-CM

## 2011-03-11 NOTE — Progress Notes (Signed)
Subjective:    Patient ID: Corey Mcdonald, male    DOB: [birth year], 78 y.o.   MRN: [id number]  HPI  78 year old patient who has a history of coronary artery disease and paroxysmal atrial fibrillation. His rhythm has been normal sinus and presently on aspirin therapy only. Remains on Cardizem for blood pressure control. He has coronary artery disease which has been stable. He denies any exertional chest pain or symptoms of heart failure. He does have a history of macular degeneration and is considering cataract extraction surgery. He has dyslipidemia and has been well maintained on Crestor 10 mg daily.  Past Medical History  Diagnosis Date  . ABDOMINAL AORTIC ANEURYSM 09/15/2008  . Atrial fibrillation 10/10/2006  . Atrial flutter 08/18/2006  . BENIGN PROSTATIC HYPERTROPHY, HX OF 08/18/2006  . COLONIC POLYPS, HX OF 08/18/2006  . HYPERLIPIDEMIA 12/04/2009  . HYPERTENSION 08/18/2006  . HYPOTENSION 09/04/2008  . MACULAR DEGENERATION 08/18/2006  . PEPTIC ULCER DISEASE, HX OF 08/18/2006  . RESTLESS LEGS SYNDROME 12/04/2009  . SLEEP APNEA 08/18/2006  . VERTIGO 07/28/2008  . WEAKNESS 06/23/2008  . CORONARY ARTERY DISEASE     non-ST segment MI, March 15, 2010  . Anemia January 2012    admitted with acute MI March 15, 2010 and has significant acute blood loss anemia requiring transfusions of two units of packed RBCs.  Status post upper endoscopy March 22, 2010 without High-Risk bleeding lesion.  History of peptic ulcer disease  . Myocardial infarction 02/2010  . Leg pain   . AAA (abdominal aortic aneurysm)     History   Social History  . Marital Status: Married    Spouse Name: N/A    Number of Children: N/A  . Years of Education: N/A   Occupational History  . Not on file.   Social History Main Topics  . Smoking status: Former Smoker -- 40 years    Types: Cigarettes    Quit date: 02/22/1983  . Smokeless tobacco: Never Used  . Alcohol Use: No  . Drug Use: No  . Sexually Active: Not on file     Other Topics Concern  . Not on file   Social History Narrative  . No narrative on file    Past Surgical History  Procedure Date  . Inguinal hernia repair     Left inguinal  . Coronary artery bypass graft 1997    Family History  Problem Relation Age of Onset  . Heart disease Mother   . Heart disease Father   . Heart disease Sister   . Macular degeneration Sister   . Heart disease Sister     Allergies  Allergen Reactions  . Cilostazol Other (See Comments)    Adverse reaction per patient  . Doxazosin Other (See Comments)    INTOLERANCE     Current Outpatient Prescriptions on File Prior to Visit  Medication Sig Dispense Refill  . acetaminophen (TYLENOL) 325 MG tablet Take 650 mg by mouth every 4 (four) hours as needed. FOR PAIN OR FEVER      . aspirin 81 MG tablet Take 81 mg by mouth daily.        . carbidopa-levodopa (SINEMET CR) 25-100 MG per tablet Take by mouth. 1/2 tablet at bedtime       . diltiazem (CARDIZEM CD) 180 MG 24 hr capsule Take 180 mg by mouth daily with breakfast.        . diltiazem (CARDIZEM CD) 180 MG 24 hr capsule TAKE ONE CAPSULE BY MOUTH EVERY DAY  90 capsule  2  . docusate sodium (COLACE) 100 MG capsule Take 100 mg by mouth daily.       . hydrochlorothiazide 25 MG tablet Take 25 mg by mouth every Monday, Wednesday, and Friday.       . metoprolol (LOPRESSOR) 25 MG tablet Take 25 mg by mouth 2 (two) times daily.       . Multiple Vitamin (MULTIVITAMIN) tablet Take 1 tablet by mouth daily.       . nitroGLYCERIN (NITROSTAT) 0.4 MG SL tablet Place 0.4 mg under the tongue every 5 (five) minutes as needed. FOR CHEST PAIN       . pantoprazole (PROTONIX) 40 MG tablet Take 40 mg by mouth daily.       . rosuvastatin (CRESTOR) 10 MG tablet Take 10 mg by mouth every evening.          BP 120/70  Temp(Src) 97.9 F (36.6 C) (Oral)  Wt 225 lb (102.059 kg)       Review of Systems  Constitutional: Negative for fever, chills, appetite change and fatigue.   HENT: Negative for hearing loss, ear pain, congestion, sore throat, trouble swallowing, neck stiffness, dental problem, voice change and tinnitus.   Eyes: Positive for visual disturbance. Negative for pain and discharge.  Respiratory: Negative for cough, chest tightness, wheezing and stridor.   Cardiovascular: Positive for leg swelling. Negative for chest pain and palpitations.  Gastrointestinal: Negative for nausea, vomiting, abdominal pain, diarrhea, constipation, blood in stool and abdominal distention.  Genitourinary: Negative for urgency, hematuria, flank pain, discharge, difficulty urinating and genital sores.  Musculoskeletal: Positive for back pain. Negative for myalgias, joint swelling, arthralgias and gait problem.  Skin: Negative for rash.  Neurological: Negative for dizziness, syncope, speech difficulty, weakness, numbness and headaches.  Hematological: Negative for adenopathy. Does not bruise/bleed easily.  Psychiatric/Behavioral: Negative for behavioral problems and dysphoric mood. The patient is not nervous/anxious.        Objective:   Physical Exam  Constitutional: He is oriented to person, place, and time. He appears well-developed.  HENT:  Head: Normocephalic.  Right Ear: External ear normal.  Left Ear: External ear normal.  Eyes: Conjunctivae and EOM are normal.  Neck: Normal range of motion.  Cardiovascular: Normal rate and regular rhythm.   Murmur heard.      Rhythm is regular Grade 2-3/6 systolic murmur loudest at the base  Pulmonary/Chest: Breath sounds normal.  Abdominal: Bowel sounds are normal.  Musculoskeletal: Normal range of motion. He exhibits edema. He exhibits no tenderness.       +1 peripheral edema  Neurological: He is alert and oriented to person, place, and time.  Psychiatric: He has a normal mood and affect. His behavior is normal.          Assessment & Plan:   Hypertension well controlled Paroxysmal atrial fibrillation. Remains in  normal sinus Coronary artery disease stable Dyslipidemia. We'll continue Crestor 10 mg daily samples provided  Low-salt diet recommended Recheck in 4 months

## 2011-03-11 NOTE — Patient Instructions (Signed)
Limit your sodium (Salt) intake    It is important that you exercise regularly, at least 20 minutes 3 to 4 times per week.  If you develop chest pain or shortness of breath seek  medical attention.  Return in 4 months for follow-up   

## 2011-04-22 HISTORY — PX: TRANSTHORACIC ECHOCARDIOGRAM: SHX275

## 2011-05-09 ENCOUNTER — Observation Stay (HOSPITAL_COMMUNITY)
Admission: EM | Admit: 2011-05-09 | Discharge: 2011-05-11 | Disposition: A | Discharge status: Home / Self Care | Payer: Medicare Other | Source: Ambulatory Visit | Attending: Family Medicine | Admitting: Family Medicine

## 2011-05-09 ENCOUNTER — Encounter (HOSPITAL_COMMUNITY): Payer: Self-pay | Admitting: Emergency Medicine

## 2011-05-09 ENCOUNTER — Other Ambulatory Visit: Payer: Self-pay

## 2011-05-09 DIAGNOSIS — I714 Abdominal aortic aneurysm, without rupture, unspecified: Secondary | ICD-10-CM | POA: Insufficient documentation

## 2011-05-09 DIAGNOSIS — R0789 Other chest pain: Principal | ICD-10-CM | POA: Insufficient documentation

## 2011-05-09 DIAGNOSIS — I4891 Unspecified atrial fibrillation: Secondary | ICD-10-CM | POA: Diagnosis present

## 2011-05-09 DIAGNOSIS — I252 Old myocardial infarction: Secondary | ICD-10-CM | POA: Insufficient documentation

## 2011-05-09 DIAGNOSIS — Z95828 Presence of other vascular implants and grafts: Secondary | ICD-10-CM | POA: Diagnosis present

## 2011-05-09 DIAGNOSIS — R0602 Shortness of breath: Secondary | ICD-10-CM | POA: Insufficient documentation

## 2011-05-09 DIAGNOSIS — R61 Generalized hyperhidrosis: Secondary | ICD-10-CM | POA: Insufficient documentation

## 2011-05-09 DIAGNOSIS — I2510 Atherosclerotic heart disease of native coronary artery without angina pectoris: Secondary | ICD-10-CM | POA: Insufficient documentation

## 2011-05-09 DIAGNOSIS — Z951 Presence of aortocoronary bypass graft: Secondary | ICD-10-CM | POA: Insufficient documentation

## 2011-05-09 DIAGNOSIS — E785 Hyperlipidemia, unspecified: Secondary | ICD-10-CM | POA: Diagnosis present

## 2011-05-09 DIAGNOSIS — Z9989 Dependence on other enabling machines and devices: Secondary | ICD-10-CM | POA: Diagnosis present

## 2011-05-09 DIAGNOSIS — I10 Essential (primary) hypertension: Secondary | ICD-10-CM | POA: Diagnosis present

## 2011-05-09 NOTE — ED Notes (Signed)
Pt to ED for eval of chest pain starting around 10:30; pt reports that pain hurts all over in chest describes as sharp/stabbing; pt reports when he takes a deep breath pain gets worse; pt took 3 nitro at home, but did not take asa; pt reports SOB; pt denies n/v/d; pt denies any recent trips; pt with sig cardiac history

## 2011-05-09 NOTE — ED Provider Notes (Signed)
History     CSN: [id number]  Arrival date & time 05/09/11  2334   First MD Initiated Contact with Patient 05/09/11 2344      Chief Complaint  Patient presents with  . Chest Pain    (Consider location/radiation/quality/duration/timing/severity/associated sxs/prior treatment) HPI Comments: 78 year old male with a history of coronary disease status post 5 vessel bypass in 1997, non-ST elevation myocardial infarction approximately one year ago who presents with acute onset of chest pain that started approximately 1.5 hours prior to arrival, is persistent, improved after nitroglycerin, described as CPT with radiation from left and right chest to his back. He denies any recent trauma, travel, swelling, surgery. He does have a history of abdominal aortic aneurysm status post endovascular repair in November of 2012. Currently the symptoms are mild to moderate, he has not had aspirin prior to arrival.  He states that this pain was much worse than his heart attack last year.  The history is provided by the patient and medical records.    Past Medical History  Diagnosis Date  . ABDOMINAL AORTIC ANEURYSM 09/15/2008  . Atrial fibrillation 10/10/2006  . Atrial flutter 08/18/2006  . BENIGN PROSTATIC HYPERTROPHY, HX OF 08/18/2006  . COLONIC POLYPS, HX OF 08/18/2006  . HYPERLIPIDEMIA 12/04/2009  . HYPERTENSION 08/18/2006  . HYPOTENSION 09/04/2008  . MACULAR DEGENERATION 08/18/2006  . PEPTIC ULCER DISEASE, HX OF 08/18/2006  . RESTLESS LEGS SYNDROME 12/04/2009  . SLEEP APNEA 08/18/2006  . VERTIGO 07/28/2008  . WEAKNESS 06/23/2008  . CORONARY ARTERY DISEASE     non-ST segment MI, March 15, 2010  . Anemia January 2012    admitted with acute MI March 15, 2010 and has significant acute blood loss anemia requiring transfusions of two units of packed RBCs.  Status post upper endoscopy March 22, 2010 without High-Risk bleeding lesion.  History of peptic ulcer disease  . Myocardial infarction 02/2010  . Leg  pain   . AAA (abdominal aortic aneurysm)     Past Surgical History  Procedure Date  . Inguinal hernia repair     Left inguinal  . Coronary artery bypass graft 1997    Family History  Problem Relation Age of Onset  . Heart disease Mother   . Heart disease Father   . Heart disease Sister   . Macular degeneration Sister   . Heart disease Sister     History  Substance Use Topics  . Smoking status: Former Smoker -- 40 years    Types: Cigarettes    Quit date: 02/22/1983  . Smokeless tobacco: Never Used  . Alcohol Use: No      Review of Systems  All other systems reviewed and are negative.    Allergies  Cilostazol and Doxazosin  Home Medications   No current outpatient prescriptions on file.  BP 176/74  Pulse 77  Temp(Src) 97.9 F (36.6 C) (Oral)  Resp 16  Ht 5\' 11"  (1.803 m)  Wt 221 lb 6.4 oz (100.426 kg)  BMI 30.88 kg/m2  SpO2 96%  Physical Exam  Nursing note and vitals reviewed. Constitutional: He appears well-developed and well-nourished. No distress.  HENT:  Head: Normocephalic and atraumatic.  Mouth/Throat: Oropharynx is clear and moist. No oropharyngeal exudate.  Eyes: Conjunctivae and EOM are normal. Pupils are equal, round, and reactive to light. Right eye exhibits no discharge. Left eye exhibits no discharge. No scleral icterus.  Neck: Normal range of motion. Neck supple. No JVD present. No thyromegaly present.  Cardiovascular: Normal rate, regular rhythm, normal heart  sounds and intact distal pulses.  Exam reveals no gallop and no friction rub.   No murmur heard. Pulmonary/Chest: Effort normal and breath sounds normal. No respiratory distress. He has no wheezes. He has no rales.  Abdominal: Soft. Bowel sounds are normal. He exhibits no distension and no mass. There is no tenderness.  Musculoskeletal: Normal range of motion. He exhibits no edema and no tenderness.  Lymphadenopathy:    He has no cervical adenopathy.  Neurological: He is alert.  Coordination normal.  Skin: Skin is warm and dry. No rash noted. No erythema.  Psychiatric: He has a normal mood and affect. His behavior is normal.    ED Course  Procedures (including critical care time)  ED ECG REPORT   Date: 05/10/2011   Rate: 65  Rhythm: normal sinus rhythm  QRS Axis: normal  Intervals: normal  ST/T Wave abnormalities: normal  Conduction Disutrbances:none  Narrative Interpretation:   Old EKG Reviewed: Compared with EKG from 12/17/2010, no changes seen   Labs Reviewed  D-DIMER, QUANTITATIVE - Abnormal; Notable for the following:    D-Dimer, Quant 1.37 (*)    All other components within normal limits  CBC - Abnormal; Notable for the following:    WBC 12.2 (*)    Hemoglobin 12.5 (*)    HCT 37.6 (*)    All other components within normal limits  DIFFERENTIAL - Abnormal; Notable for the following:    Neutro Abs 8.8 (*)    Monocytes Absolute 1.4 (*)    All other components within normal limits  POCT I-STAT, CHEM 8 - Abnormal; Notable for the following:    Glucose, Bld 100 (*)    All other components within normal limits  APTT  PROTIME-INR  TROPONIN I  POCT I-STAT TROPONIN I  CARDIAC PANEL(CRET KIN+CKTOT+MB+TROPI)  CBC  BASIC METABOLIC PANEL  CARDIAC PANEL(CRET KIN+CKTOT+MB+TROPI)  CARDIAC PANEL(CRET KIN+CKTOT+MB+TROPI)   Ct Angio Chest W/cm &/or Wo Cm  05/10/2011  *RADIOLOGY REPORT*  Clinical Data: Evaluate for pulmonary embolus.  Rule out dissection.  CT ANGIOGRAPHY CHEST  Technique:  Multidetector CT imaging of the chest using the standard protocol during bolus administration of intravenous contrast. Multiplanar reconstructed images including MIPs were obtained and reviewed to evaluate the vascular anatomy.  Contrast: OMNIPAQUE IOHEXOL 350 MG/ML IV SOLN  Comparison: 05/10/2011  Findings: No abnormal filling defects are identified within the main pulmonary artery or their branches to suggest an acute pulmonary embolus.  There is calcified  atherosclerotic disease involving the thoracic aorta and its branches.  No evidence for thoracic aortic dissection.  There is a small hiatal hernia noted.  No enlarged axillary or supraclavicular lymph nodes.  There is no enlarged mediastinal or hilar adenopathy.  No pericardial or pleural effusion.  Prior median sternotomy and CABG.  Mild emphysema.  No airspace consolidation identified.  No suspicious pulmonary nodule or mass identified.  There is mild multilevel thoracic spondylosis identified.  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  Stones are noted within the dependent portion of the gallbladder.  There are cysts identified within the upper poles of both kidneys.  IMPRESSION:  1.  There are no specific features identified to suggest an acute pulmonary embolus.  No evidence for aortic dissection. 2.  Hiatal hernia. 3.  No acute cardiopulmonary abnormalities. 4.  Emphysema.  Original Report Authenticated By: Rosealee Albee, M.D.   Dg Chest Port 1 View  05/10/2011  *RADIOLOGY REPORT*  Clinical Data: Chest pain and shortness of breath.  PORTABLE CHEST -  1 VIEW  Comparison: Chest radiograph performed 12/24/2010  Findings: The lungs are well-aerated.  Vascular congestion is noted, with mildly increased interstitial markings, raising concern for minimal interstitial edema.  There is no evidence of pleural effusion or pneumothorax.  The cardiomediastinal silhouette is borderline normal in size; the patient is status post median sternotomy, with evidence of prior CABG.  Calcification is noted in the aortic arch.  No acute osseous abnormalities are seen.  IMPRESSION: Vascular congestion, with mildly increased interstitial markings, raising concern for minimal interstitial edema.  Original Report Authenticated By: Tonia Ghent, M.D.     1. Chest pain       MDM  Patient has normal vital signs, normal EKG but continuing improving pain. Will rule out acute coronary syndrome, laboratory workup,  aspirin, chest x-ray.   Laboratory results reviewed showing a troponin that was normal, d-dimer that was elevated at 1.3, leukocytosis of 12,200, basic metabolic panel that was normal. Due to history of AAA, CT scan of the chest was ordered to rule out dissection or thoracic aneurysm or pulmonary embolism. CT scan negative for any of these, patient admitted to [facility]ist for rule out of acute coronary syndrome   Vida Roller, MD 05/10/11 [id number]

## 2011-05-10 ENCOUNTER — Emergency Department (HOSPITAL_COMMUNITY): Payer: Medicare Other

## 2011-05-10 ENCOUNTER — Encounter (HOSPITAL_COMMUNITY): Payer: Self-pay | Admitting: Radiology

## 2011-05-10 DIAGNOSIS — R0789 Other chest pain: Secondary | ICD-10-CM | POA: Diagnosis present

## 2011-05-10 LAB — TROPONIN I: Troponin I: <0.3 ng/mL (ref <0.30)

## 2011-05-10 LAB — CBC
HCT: 37.6 % — ABNORMAL LOW (ref 39.0–52.0)
HCT: 37.9 % — ABNORMAL LOW (ref 39.0–52.0)
Hemoglobin: 12.5 g/dL — ABNORMAL LOW (ref 13.0–17.0)
MCH: 29.1 pg (ref 26.0–34.0)
MCH: 29.1 pg (ref 26.0–34.0)
MCV: 86.7 fL (ref 78.0–100.0)
MCV: 87.4 fL (ref 78.0–100.0)
Platelets: 240 10*3/uL (ref 150–400)
Platelets: 259 10*3/uL (ref 150–400)
RBC: 4.3 MIL/uL (ref 4.22–5.81)
RDW: 15.4 % (ref 11.5–15.5)
WBC: 12.2 10*3/uL — ABNORMAL HIGH (ref 4.0–10.5)

## 2011-05-10 LAB — BASIC METABOLIC PANEL
BUN: 16 mg/dL (ref 6–23)
CO2: 24 mEq/L (ref 19–32)
Calcium: 9.5 mg/dL (ref 8.4–10.5)
Chloride: 103 mEq/L (ref 96–112)
Creatinine, Ser: 1.03 mg/dL (ref 0.50–1.35)
GFR calc Af Amer: 74 mL/min — ABNORMAL LOW (ref >90)

## 2011-05-10 LAB — CARDIAC PANEL(CRET KIN+CKTOT+MB+TROPI)
Relative Index: INVALID (ref 0.0–2.5)
Relative Index: INVALID (ref 0.0–2.5)
Relative Index: INVALID (ref 0.0–2.5)
Total CK: 60 U/L (ref 7–232)
Total CK: 53 U/L (ref 7–232)
Troponin I: <0.3 ng/mL (ref <0.30)

## 2011-05-10 LAB — POCT I-STAT TROPONIN I: Troponin i, poc: 0.01 ng/mL (ref 0.00–0.08)

## 2011-05-10 LAB — POCT I-STAT, CHEM 8
BUN: 18 mg/dL (ref 6–23)
Chloride: 103 mEq/L (ref 96–112)
Creatinine, Ser: 1.1 mg/dL (ref 0.50–1.35)
Potassium: 4.6 mEq/L (ref 3.5–5.1)
Sodium: 140 mEq/L (ref 135–145)
TCO2: 28 mmol/L (ref 0–100)

## 2011-05-10 LAB — DIFFERENTIAL
Eosinophils Absolute: 0.3 10*3/uL (ref 0.0–0.7)
Eosinophils Relative: 2 % (ref 0–5)
Lymphocytes Relative: 15 % (ref 12–46)
Lymphs Abs: 1.8 10*3/uL (ref 0.7–4.0)
Monocytes Absolute: 1.4 10*3/uL — ABNORMAL HIGH (ref 0.1–1.0)
Monocytes Relative: 11 % (ref 3–12)

## 2011-05-10 LAB — LIPASE, BLOOD: Lipase: 26 U/L (ref 11–59)

## 2011-05-10 LAB — AMYLASE: Amylase: 70 U/L (ref 0–105)

## 2011-05-10 LAB — APTT: aPTT: 31 seconds (ref 24–37)

## 2011-05-10 MED ORDER — METOPROLOL TARTRATE 25 MG PO TABS
25.0000 mg | ORAL_TABLET | Freq: Two times a day (BID) | ORAL | Status: DC
  Administered 2011-05-10 – 2011-05-11 (×3): 25 mg via ORAL
  Filled 2011-05-10 (×4): qty 1

## 2011-05-10 MED ORDER — MORPHINE SULFATE 2 MG/ML IJ SOLN: 2.0000 mg | INTRAMUSCULAR | Status: DC | PRN

## 2011-05-10 MED ORDER — BIOTENE DRY MOUTH MT LIQD
15.0000 mL | Freq: Two times a day (BID) | OROMUCOSAL | Status: DC
  Administered 2011-05-10 – 2011-05-11 (×2): 15 mL via OROMUCOSAL

## 2011-05-10 MED ORDER — PANTOPRAZOLE SODIUM 40 MG PO TBEC
40.0000 mg | DELAYED_RELEASE_TABLET | Freq: Every day | ORAL | Status: DC
  Administered 2011-05-10 – 2011-05-11 (×2): 40 mg via ORAL
  Filled 2011-05-10: qty 1

## 2011-05-10 MED ORDER — SODIUM CHLORIDE 0.9 % IV SOLN: 250.0000 mL | INTRAVENOUS | Status: DC | PRN

## 2011-05-10 MED ORDER — MORPHINE SULFATE 2 MG/ML IJ SOLN
2.0000 mg | Freq: Once | INTRAMUSCULAR | Status: AC
  Administered 2011-05-10: 2 mg via INTRAVENOUS
  Filled 2011-05-10: qty 1

## 2011-05-10 MED ORDER — ENOXAPARIN SODIUM 40 MG/0.4ML ~~LOC~~ SOLN
40.0000 mg | SUBCUTANEOUS | Status: DC
  Administered 2011-05-10 – 2011-05-11 (×2): 40 mg via SUBCUTANEOUS
  Filled 2011-05-10 (×2): qty 0.4

## 2011-05-10 MED ORDER — DILTIAZEM HCL ER COATED BEADS 180 MG PO CP24
180.0000 mg | ORAL_CAPSULE | Freq: Every day | ORAL | Status: DC
  Administered 2011-05-10 – 2011-05-11 (×2): 180 mg via ORAL
  Filled 2011-05-10 (×2): qty 1

## 2011-05-10 MED ORDER — ASPIRIN 81 MG PO CHEW
81.0000 mg | CHEWABLE_TABLET | Freq: Every day | ORAL | Status: DC
  Administered 2011-05-10 – 2011-05-11 (×2): 81 mg via ORAL
  Filled 2011-05-10 (×2): qty 1

## 2011-05-10 MED ORDER — DOCUSATE SODIUM 100 MG PO CAPS
100.0000 mg | ORAL_CAPSULE | Freq: Every day | ORAL | Status: DC
  Administered 2011-05-10 – 2011-05-11 (×2): 100 mg via ORAL
  Filled 2011-05-10 (×2): qty 1

## 2011-05-10 MED ORDER — IOHEXOL 350 MG/ML SOLN
100.0000 mL | Freq: Once | INTRAVENOUS | Status: AC | PRN
  Administered 2011-05-10: 100 mL via INTRAVENOUS

## 2011-05-10 MED ORDER — ASPIRIN 81 MG PO CHEW
324.0000 mg | CHEWABLE_TABLET | Freq: Once | ORAL | Status: AC
  Administered 2011-05-10: 324 mg via ORAL
  Filled 2011-05-10: qty 4

## 2011-05-10 MED ORDER — SODIUM CHLORIDE 0.9 % IJ SOLN: 3.0000 mL | INTRAMUSCULAR | Status: DC | PRN

## 2011-05-10 MED ORDER — HYDROCHLOROTHIAZIDE 12.5 MG PO CAPS
12.5000 mg | ORAL_CAPSULE | ORAL | Status: DC
  Administered 2011-05-11: 12.5 mg via ORAL
  Filled 2011-05-10: qty 1

## 2011-05-10 MED ORDER — SODIUM CHLORIDE 0.9 % IV SOLN
Freq: Once | INTRAVENOUS | Status: AC
  Administered 2011-05-10: via INTRAVENOUS

## 2011-05-10 MED ORDER — SODIUM CHLORIDE 0.9 % IJ SOLN
3.0000 mL | Freq: Two times a day (BID) | INTRAMUSCULAR | Status: DC
  Administered 2011-05-10 – 2011-05-11 (×3): 3 mL via INTRAVENOUS

## 2011-05-10 MED ORDER — ATORVASTATIN CALCIUM 20 MG PO TABS
20.0000 mg | ORAL_TABLET | Freq: Every day | ORAL | Status: DC
  Administered 2011-05-10 – 2011-05-11 (×2): 20 mg via ORAL
  Filled 2011-05-10 (×2): qty 1

## 2011-05-10 NOTE — ED Notes (Signed)
CALLED FLOOR TO GIVE REPORT , NURSE NOT READY AT THIS TIME.  

## 2011-05-10 NOTE — Progress Notes (Signed)
Utilization review complete 

## 2011-05-10 NOTE — Progress Notes (Signed)
*  PRELIMINARY RESULTS* Echocardiogram 2D Echocardiogram has been performed.  Glean Salen [facility] 05/10/2011, 10:24 AM

## 2011-05-10 NOTE — Progress Notes (Signed)
Subjective: Chest pain-free States that he saw Dr. Clarene Duke 2 weeks ago  Objective: Vital signs in last 24 hours: Filed Vitals:   05/10/11 0451 05/10/11 1119 05/10/11 1123 05/10/11 1400  BP: 176/74 158/63  134/56  Pulse: 77  78 72  Temp: 97.9 F (36.6 C)   97.8 F (36.6 C)  TempSrc: Oral   Oral  Resp: 16   18  Height: 5\' 11"  (1.803 m)     Weight: 100.426 kg (221 lb 6.4 oz)     SpO2: 96%   99%    Intake/Output Summary (Last 24 hours) at 05/10/11 1456 Last data filed at 05/10/11 0900  Gross per 24 hour  Intake      0 ml  Output    200 ml  Net   -200 ml    Weight change:  GEN: Pleasant person lying in the stretcher in no acute distress; cooperative with exam  PSYCH: alert and oriented x4; does not appear anxious does not appear depressed; affect is normal  HEENT: Mucous membranes pink and anicteric; PERRLA; EOM intact; no cervical lymphadenopathy nor thyromegaly or carotid bruit; no JVD;  Breasts:: Not examined  CHEST WALL: No tenderness  CHEST: Normal respiration, clear to auscultation bilaterally  HEART: Regular rate and rhythm; soft murmur at the left sternal border.  BACK: No kyphosis or scoliosis; no CVA tenderness  ABDOMEN: Obese, soft non-tender; no masses, no organomegaly, normal abdominal bowel sounds; no pannus; no intertriginous candida.  Rectal Exam: Not done  EXTREMITIES: No bone or joint deformity; age-appropriate arthropathy of the hands and knees; no edema; no ulcerations.  Genitalia: not examined  PULSES: 2+ and symmetric  SKIN: Normal hydration no rash or ulceration  CNS: Cranial nerves 2-12 grossly intact no focal neurologic deficit    Lab Results: Results for orders placed during the hospital encounter of 05/09/11 (from the past 24 hour(s))  D-DIMER, QUANTITATIVE     Status: Abnormal   Collection Time   05/10/11 12:06 AM      Component Value Range   D-Dimer, Quant 1.37 (*) 0.00 - 0.48 (ug/mL-FEU)  CBC     Status: Abnormal   Collection Time   05/10/11 12:06 AM      Component Value Range   WBC 12.2 (*) 4.0 - 10.5 (K/uL)   RBC 4.30  4.22 - 5.81 (MIL/uL)   Hemoglobin 12.5 (*) 13.0 - 17.0 (g/dL)   HCT 16.1 (*) 09.6 - 52.0 (%)   MCV 87.4  78.0 - 100.0 (fL)   MCH 29.1  26.0 - 34.0 (pg)   MCHC 33.2  30.0 - 36.0 (g/dL)   RDW 04.5  40.9 - 81.1 (%)   Platelets 259  150 - 400 (K/uL)  DIFFERENTIAL     Status: Abnormal   Collection Time   05/10/11 12:06 AM      Component Value Range   Neutrophils Relative 72  43 - 77 (%)   Neutro Abs 8.8 (*) 1.7 - 7.7 (K/uL)   Lymphocytes Relative 15  12 - 46 (%)   Lymphs Abs 1.8  0.7 - 4.0 (K/uL)   Monocytes Relative 11  3 - 12 (%)   Monocytes Absolute 1.4 (*) 0.1 - 1.0 (K/uL)   Eosinophils Relative 2  0 - 5 (%)   Eosinophils Absolute 0.3  0.0 - 0.7 (K/uL)   Basophils Relative 0  0 - 1 (%)   Basophils Absolute 0.0  0.0 - 0.1 (K/uL)  APTT     Status: Normal  Collection Time   05/10/11 12:06 AM      Component Value Range   aPTT 31  24 - 37 (seconds)  PROTIME-INR     Status: Normal   Collection Time   05/10/11 12:06 AM      Component Value Range   Prothrombin Time 12.7  11.6 - 15.2 (seconds)   INR 0.93  0.00 - 1.49   TROPONIN I     Status: Normal   Collection Time   05/10/11 12:11 AM      Component Value Range   Troponin I <0.30  <0.30 (ng/mL)  POCT I-STAT TROPONIN I     Status: Normal   Collection Time   05/10/11 12:26 AM      Component Value Range   Troponin i, poc 0.01  0.00 - 0.08 (ng/mL)   Comment 3           POCT I-STAT, CHEM 8     Status: Abnormal   Collection Time   05/10/11 12:28 AM      Component Value Range   Sodium 140  135 - 145 (mEq/L)   Potassium 4.6  3.5 - 5.1 (mEq/L)   Chloride 103  96 - 112 (mEq/L)   BUN 18  6 - 23 (mg/dL)   Creatinine, Ser 6.04  0.50 - 1.35 (mg/dL)   Glucose, Bld 540 (*) 70 - 99 (mg/dL)   Calcium, Ion 9.81  1.91 - 1.32 (mmol/L)   TCO2 28  0 - 100 (mmol/L)   Hemoglobin 13.3  13.0 - 17.0 (g/dL)   HCT 47.8  29.5 - 62.1 (%)  CBC     Status: Abnormal     Collection Time   05/10/11  6:35 AM      Component Value Range   WBC 12.0 (*) 4.0 - 10.5 (K/uL)   RBC 4.37  4.22 - 5.81 (MIL/uL)   Hemoglobin 12.7 (*) 13.0 - 17.0 (g/dL)   HCT 30.8 (*) 65.7 - 52.0 (%)   MCV 86.7  78.0 - 100.0 (fL)   MCH 29.1  26.0 - 34.0 (pg)   MCHC 33.5  30.0 - 36.0 (g/dL)   RDW 84.6  96.2 - 95.2 (%)   Platelets 240  150 - 400 (K/uL)  BASIC METABOLIC PANEL     Status: Abnormal   Collection Time   05/10/11  6:35 AM      Component Value Range   Sodium 139  135 - 145 (mEq/L)   Potassium 4.7  3.5 - 5.1 (mEq/L)   Chloride 103  96 - 112 (mEq/L)   CO2 24  19 - 32 (mEq/L)   Glucose, Bld 126 (*) 70 - 99 (mg/dL)   BUN 16  6 - 23 (mg/dL)   Creatinine, Ser 8.41  0.50 - 1.35 (mg/dL)   Calcium 9.5  8.4 - 32.4 (mg/dL)   GFR calc non Af Amer 64 (*) >90 (mL/min)   GFR calc Af Amer 74 (*) >90 (mL/min)  CARDIAC PANEL(CRET KIN+CKTOT+MB+TROPI)     Status: Normal   Collection Time   05/10/11  6:35 AM      Component Value Range   Total CK 60  7 - 232 (U/L)   CK, MB 1.7  0.3 - 4.0 (ng/mL)   Troponin I <0.30  <0.30 (ng/mL)   Relative Index RELATIVE INDEX IS INVALID  0.0 - 2.5   CARDIAC PANEL(CRET KIN+CKTOT+MB+TROPI)     Status: Normal   Collection Time   05/10/11  1:07 PM  Component Value Range   Total CK 53  7 - 232 (U/L)   CK, MB 1.6  0.3 - 4.0 (ng/mL)   Troponin I <0.30  <0.30 (ng/mL)   Relative Index RELATIVE INDEX IS INVALID  0.0 - 2.5      Micro: No results found for this or any previous visit (from the past 240 hour(s)).  Studies/Results: Ct Angio Chest W/cm &/or Wo Cm  05/10/2011  *RADIOLOGY REPORT*  Clinical Data: Evaluate for pulmonary embolus.  Rule out dissection.  CT ANGIOGRAPHY CHEST  Technique:  Multidetector CT imaging of the chest using the standard protocol during bolus administration of intravenous contrast. Multiplanar reconstructed images including MIPs were obtained and reviewed to evaluate the vascular anatomy.  Contrast: OMNIPAQUE IOHEXOL  350 MG/ML IV SOLN  Comparison: 05/10/2011  Findings: No abnormal filling defects are identified within the main pulmonary artery or their branches to suggest an acute pulmonary embolus.  There is calcified atherosclerotic disease involving the thoracic aorta and its branches.  No evidence for thoracic aortic dissection.  There is a small hiatal hernia noted.  No enlarged axillary or supraclavicular lymph nodes.  There is no enlarged mediastinal or hilar adenopathy.  No pericardial or pleural effusion.  Prior median sternotomy and CABG.  Mild emphysema.  No airspace consolidation identified.  No suspicious pulmonary nodule or mass identified.  There is mild multilevel thoracic spondylosis identified.  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  Stones are noted within the dependent portion of the gallbladder.  There are cysts identified within the upper poles of both kidneys.  IMPRESSION:  1.  There are no specific features identified to suggest an acute pulmonary embolus.  No evidence for aortic dissection. 2.  Hiatal hernia. 3.  No acute cardiopulmonary abnormalities. 4.  Emphysema.  Original Report Authenticated By: Rosealee Albee, M.D.   Dg Chest Port 1 View  05/10/2011  *RADIOLOGY REPORT*  Clinical Data: Chest pain and shortness of breath.  PORTABLE CHEST - 1 VIEW  Comparison: Chest radiograph performed 12/24/2010  Findings: The lungs are well-aerated.  Vascular congestion is noted, with mildly increased interstitial markings, raising concern for minimal interstitial edema.  There is no evidence of pleural effusion or pneumothorax.  The cardiomediastinal silhouette is borderline normal in size; the patient is status post median sternotomy, with evidence of prior CABG.  Calcification is noted in the aortic arch.  No acute osseous abnormalities are seen.  IMPRESSION: Vascular congestion, with mildly increased interstitial markings, raising concern for minimal interstitial edema.  Original Report  Authenticated By: Tonia Ghent, M.D.    Medications:  Scheduled Meds:   . sodium chloride   Intravenous Once  . antiseptic oral rinse  15 mL Mouth Rinse BID  . aspirin  324 mg Oral Once  . aspirin  81 mg Oral Daily  . atorvastatin  20 mg Oral q1800  . diltiazem  180 mg Oral Daily  . docusate sodium  100 mg Oral Daily  . enoxaparin  40 mg Subcutaneous Q24H  . hydrochlorothiazide  12.5 mg Oral Q M,W,F  . metoprolol tartrate  25 mg Oral BID  .  morphine injection  2 mg Intravenous Once  . pantoprazole  40 mg Oral Daily  . sodium chloride  3 mL Intravenous Q12H   Continuous Infusions:  PRN Meds:.sodium chloride, iohexol, morphine, sodium chloride   Assessment: Principal Problem:  *Chest pain, atypical Active Problems:  HYPERLIPIDEMIA  HYPERTENSION  CORONARY ARTERY DISEASE  Atrial fibrillation  ABDOMINAL AORTIC ANEURYSM  SLEEP APNEA   Plan: 2-D echo is done and results are pending at this time Patient is on anticoagulation for his atrial fibrillation, rate controlled  [facility] has been consulted Chest pain sounds more musculoskeletal Cardiology to determine if the patient needs an inpatient stress test Full code Lovenox for DVT prophylaxis Disposition anticipate discharge tomorrow   LOS: 1 day   [facility] 05/10/2011, 2:56 PM

## 2011-05-10 NOTE — ED Notes (Signed)
PT. TRANSPORTED TO FLOOR IN STABLE CONDITION , PAIN IMPROVED AFTER RECEIVING MORPHINE , RESPIRATIONS UNLABORED. IV SITE INTACT / UNREMARKABLE .

## 2011-05-10 NOTE — Consult Note (Signed)
Reason for Consult:chest pain Referring Physician:  Kavonte Bearse is an 78 y.o. male.  HPI: patient is an 78 year old Caucasian male who is obese. History coronary disease and had coronary artery bypass grafting in 1997. In a non-ST elevation myocardial infarction in January 2004. Catheterization at that time showed loss of the saphenous vein graft to the RCA and ejection fraction of 45%.  His history also includes macular degeneration, atrial fibrillation, BPH, hyperlipidemia, hypertension, hypotension, restless leg syndrome, sleep apnea.  Patient developed substernal chest pain approximately 2230 hours last night. He states it was a "sharp" severe pain which was exacerbated every time he took a breath and exhaled. The pain was 6-7/10 in intensity and lasted only. He tried 3 nitroglycerin at home provided no relief. He states the pain she radiated down into his stomach and wrapped around to his back.  He also notes the pain is worse with rotation of his torso. He states that the pain "grabs me". Patient is a fair amount of walking also takes in the trash and from his house to [place] next and last week he had no episodes of chest pain while doing his exercises. He does note that his calves get extremely tight while he was walking and he has to stop 2- 3 times for them to relax and for the pain to ease off.  When the patient had his NST EMI/January 2012 he states that the pain was more dull in nature radiated to both arms and to his jaw.  He denies current radiation of pain, diaphoresis, palpitations, shortness of breath, lower extremity edema.  Past Medical History  Diagnosis Date  . ABDOMINAL AORTIC ANEURYSM 09/15/2008  . Atrial fibrillation 10/10/2006  . Atrial flutter 08/18/2006  . BENIGN PROSTATIC HYPERTROPHY, HX OF 08/18/2006  . COLONIC POLYPS, HX OF 08/18/2006  . HYPERLIPIDEMIA 12/04/2009  . HYPERTENSION 08/18/2006  . HYPOTENSION 09/04/2008  . MACULAR DEGENERATION 08/18/2006  . PEPTIC ULCER DISEASE, HX  OF 08/18/2006  . RESTLESS LEGS SYNDROME 12/04/2009  . SLEEP APNEA 08/18/2006  . VERTIGO 07/28/2008  . WEAKNESS 06/23/2008  . CORONARY ARTERY DISEASE     non-ST segment MI, March 15, 2010  . Anemia January 2012    admitted with acute MI March 15, 2010 and has significant acute blood loss anemia requiring transfusions of two units of packed RBCs.  Status post upper endoscopy March 22, 2010 without High-Risk bleeding lesion.  History of peptic ulcer disease  . Myocardial infarction 02/2010  . Leg pain   . AAA (abdominal aortic aneurysm)     Past Surgical History  Procedure Date  . Inguinal hernia repair     Left inguinal  . Coronary artery bypass graft 1997    Family History  Problem Relation Age of Onset  . Heart disease Mother   . Heart disease Father   . Heart disease Sister   . Macular degeneration Sister   . Heart disease Sister     Social History:  reports that he quit smoking about 28 years ago. His smoking use included Cigarettes. He quit after 40 years of use. He has never used smokeless tobacco. He reports that he does not drink alcohol or use illicit drugs.  Allergies:  Allergies  Allergen Reactions  . Cilostazol Other (See Comments)    Adverse reaction per patient  . Doxazosin Other (See Comments)    INTOLERANCE     Medications:     . sodium chloride   Intravenous Once  . antiseptic oral rinse  15 mL Mouth Rinse BID  . aspirin  324 mg Oral Once  . aspirin  81 mg Oral Daily  . atorvastatin  20 mg Oral q1800  . diltiazem  180 mg Oral Daily  . docusate sodium  100 mg Oral Daily  . enoxaparin  40 mg Subcutaneous Q24H  . hydrochlorothiazide  12.5 mg Oral Q M,W,F  . metoprolol tartrate  25 mg Oral BID  .  morphine injection  2 mg Intravenous Once  . pantoprazole  40 mg Oral Daily  . sodium chloride  3 mL Intravenous Q12H     Results for orders placed during the hospital encounter of 05/09/11 (from the past 48 hour(s))  D-DIMER, QUANTITATIVE     Status:  Abnormal   Collection Time   05/10/11 12:06 AM      Component Value Range Comment   D-Dimer, Quant 1.37 (*) 0.00 - 0.48 (ug/mL-FEU)   CBC     Status: Abnormal   Collection Time   05/10/11 12:06 AM      Component Value Range Comment   WBC 12.2 (*) 4.0 - 10.5 (K/uL)    RBC 4.30  4.22 - 5.81 (MIL/uL)    Hemoglobin 12.5 (*) 13.0 - 17.0 (g/dL)    HCT 16.1 (*) 09.6 - 52.0 (%)    MCV 87.4  78.0 - 100.0 (fL)    MCH 29.1  26.0 - 34.0 (pg)    MCHC 33.2  30.0 - 36.0 (g/dL)    RDW 04.5  40.9 - 81.1 (%)    Platelets 259  150 - 400 (K/uL)   DIFFERENTIAL     Status: Abnormal   Collection Time   05/10/11 12:06 AM      Component Value Range Comment   Neutrophils Relative 72  43 - 77 (%)    Neutro Abs 8.8 (*) 1.7 - 7.7 (K/uL)    Lymphocytes Relative 15  12 - 46 (%)    Lymphs Abs 1.8  0.7 - 4.0 (K/uL)    Monocytes Relative 11  3 - 12 (%)    Monocytes Absolute 1.4 (*) 0.1 - 1.0 (K/uL)    Eosinophils Relative 2  0 - 5 (%)    Eosinophils Absolute 0.3  0.0 - 0.7 (K/uL)    Basophils Relative 0  0 - 1 (%)    Basophils Absolute 0.0  0.0 - 0.1 (K/uL)   APTT     Status: Normal   Collection Time   05/10/11 12:06 AM      Component Value Range Comment   aPTT 31  24 - 37 (seconds)   PROTIME-INR     Status: Normal   Collection Time   05/10/11 12:06 AM      Component Value Range Comment   Prothrombin Time 12.7  11.6 - 15.2 (seconds)    INR 0.93  0.00 - 1.49    TROPONIN I     Status: Normal   Collection Time   05/10/11 12:11 AM      Component Value Range Comment   Troponin I <0.30  <0.30 (ng/mL)   POCT I-STAT TROPONIN I     Status: Normal   Collection Time   05/10/11 12:26 AM      Component Value Range Comment   Troponin i, poc 0.01  0.00 - 0.08 (ng/mL)    Comment 3            POCT I-STAT, CHEM 8     Status: Abnormal   Collection Time  05/10/11 12:28 AM      Component Value Range Comment   Sodium 140  135 - 145 (mEq/L)    Potassium 4.6  3.5 - 5.1 (mEq/L)    Chloride 103  96 - 112 (mEq/L)    BUN 18   6 - 23 (mg/dL)    Creatinine, Ser 1.61  0.50 - 1.35 (mg/dL)    Glucose, Bld 096 (*) 70 - 99 (mg/dL)    Calcium, Ion 0.45  1.12 - 1.32 (mmol/L)    TCO2 28  0 - 100 (mmol/L)    Hemoglobin 13.3  13.0 - 17.0 (g/dL)    HCT 40.9  81.1 - 91.4 (%)   CBC     Status: Abnormal   Collection Time   05/10/11  6:35 AM      Component Value Range Comment   WBC 12.0 (*) 4.0 - 10.5 (K/uL) WHITE COUNT CONFIRMED ON SMEAR   RBC 4.37  4.22 - 5.81 (MIL/uL)    Hemoglobin 12.7 (*) 13.0 - 17.0 (g/dL)    HCT 78.2 (*) 95.6 - 52.0 (%)    MCV 86.7  78.0 - 100.0 (fL)    MCH 29.1  26.0 - 34.0 (pg)    MCHC 33.5  30.0 - 36.0 (g/dL)    RDW 21.3  08.6 - 57.8 (%)    Platelets 240  150 - 400 (K/uL)   BASIC METABOLIC PANEL     Status: Abnormal   Collection Time   05/10/11  6:35 AM      Component Value Range Comment   Sodium 139  135 - 145 (mEq/L)    Potassium 4.7  3.5 - 5.1 (mEq/L) HEMOLYSIS AT THIS LEVEL MAY AFFECT RESULT   Chloride 103  96 - 112 (mEq/L)    CO2 24  19 - 32 (mEq/L)    Glucose, Bld 126 (*) 70 - 99 (mg/dL)    BUN 16  6 - 23 (mg/dL)    Creatinine, Ser 4.69  0.50 - 1.35 (mg/dL)    Calcium 9.5  8.4 - 10.5 (mg/dL)    GFR calc non Af Amer 64 (*) >90 (mL/min)    GFR calc Af Amer 74 (*) >90 (mL/min)   CARDIAC PANEL(CRET KIN+CKTOT+MB+TROPI)     Status: Normal   Collection Time   05/10/11  6:35 AM      Component Value Range Comment   Total CK 60  7 - 232 (U/L)    CK, MB 1.7  0.3 - 4.0 (ng/mL)    Troponin I <0.30  <0.30 (ng/mL)    Relative Index RELATIVE INDEX IS INVALID  0.0 - 2.5    CARDIAC PANEL(CRET KIN+CKTOT+MB+TROPI)     Status: Normal   Collection Time   05/10/11  1:07 PM      Component Value Range Comment   Total CK 53  7 - 232 (U/L)    CK, MB 1.6  0.3 - 4.0 (ng/mL)    Troponin I <0.30  <0.30 (ng/mL)    Relative Index RELATIVE INDEX IS INVALID  0.0 - 2.5      Ct Angio Chest W/cm &/or Wo Cm  05/10/2011  *RADIOLOGY REPORT*  Clinical Data: Evaluate for pulmonary embolus.  Rule out dissection.  CT  ANGIOGRAPHY CHEST  Technique:  Multidetector CT imaging of the chest using the standard protocol during bolus administration of intravenous contrast. Multiplanar reconstructed images including MIPs were obtained and reviewed to evaluate the vascular anatomy.  Contrast: OMNIPAQUE IOHEXOL 350 MG/ML IV SOLN  Comparison:  05/10/2011  Findings: No abnormal filling defects are identified within the main pulmonary artery or their branches to suggest an acute pulmonary embolus.  There is calcified atherosclerotic disease involving the thoracic aorta and its branches.  No evidence for thoracic aortic dissection.  There is a small hiatal hernia noted.  No enlarged axillary or supraclavicular lymph nodes.  There is no enlarged mediastinal or hilar adenopathy.  No pericardial or pleural effusion.  Prior median sternotomy and CABG.  Mild emphysema.  No airspace consolidation identified.  No suspicious pulmonary nodule or mass identified.  There is mild multilevel thoracic spondylosis identified.  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  Stones are noted within the dependent portion of the gallbladder.  There are cysts identified within the upper poles of both kidneys.  IMPRESSION:  1.  There are no specific features identified to suggest an acute pulmonary embolus.  No evidence for aortic dissection. 2.  Hiatal hernia. 3.  No acute cardiopulmonary abnormalities. 4.  Emphysema.  Original Report Authenticated By: Rosealee Albee, M.D.   Dg Chest Port 1 View  05/10/2011  *RADIOLOGY REPORT*  Clinical Data: Chest pain and shortness of breath.  PORTABLE CHEST - 1 VIEW  Comparison: Chest radiograph performed 12/24/2010  Findings: The lungs are well-aerated.  Vascular congestion is noted, with mildly increased interstitial markings, raising concern for minimal interstitial edema.  There is no evidence of pleural effusion or pneumothorax.  The cardiomediastinal silhouette is borderline normal in size; the patient is status  post median sternotomy, with evidence of prior CABG.  Calcification is noted in the aortic arch.  No acute osseous abnormalities are seen.  IMPRESSION: Vascular congestion, with mildly increased interstitial markings, raising concern for minimal interstitial edema.  Original Report Authenticated By: Tonia Ghent, M.D.    Review of Systems  Constitutional: Negative for fever and diaphoresis.  HENT: Negative for congestion.   Eyes:       Patient is legally blind  Cardiovascular: Positive for chest pain, claudication (The patient states that his legs get tight while walking  and he has to stop 2- 3 times so it eases off.) and leg swelling. Negative for palpitations and orthopnea.  Gastrointestinal: Positive for abdominal pain. Negative for nausea, vomiting, diarrhea, constipation, blood in stool and melena.  Genitourinary: Negative for dysuria and hematuria.  Musculoskeletal: Positive for back pain.  Neurological: Negative for dizziness, weakness and headaches.   Blood pressure 134/56, pulse 72, temperature 97.8 F (36.6 C), temperature source Oral, resp. rate 18, height 5\' 11"  (1.803 m), weight 100.426 kg (221 lb 6.4 oz), SpO2 99.00%. Physical Exam  Constitutional:       Obese Caucasian male  HENT:  Head: Normocephalic and atraumatic.  Eyes: EOM are normal. No scleral icterus.  Neck: Normal range of motion. Neck supple. No JVD present.  Cardiovascular: Normal rate and regular rhythm.   Murmur: 2/6 systolic ejection murmur. Pulses:      Radial pulses are 2+ on the right side, and 2+ on the left side.       Femoral pulses are on the right side with bruit, and on the left side with bruit.      Dorsalis pedis pulses are 0 on the right side, and 0 on the left side.  Respiratory: Effort normal and breath sounds normal. He has no wheezes. He has no rales.  GI: Soft. Bowel sounds are normal. There is tenderness (mildly tender in the epigastric and left lower quadrant regions.).    Musculoskeletal: He exhibits  edema (1+ lower extremity edema).  Lymphadenopathy:    He has no cervical adenopathy.  Neurological: He is alert. He exhibits normal muscle tone.  Skin: Skin is warm and dry.  Psychiatric: He has a normal mood and affect.    Assessment/Plan: Patient Active Hospital Problem List: Chest pain, atypical (05/10/2011) HYPERLIPIDEMIA (12/04/2009) HYPERTENSION (08/18/2006) CORONARY ARTERY DISEASE (08/18/2006) Atrial fibrillation (10/10/2006) ABDOMINAL AORTIC ANEURYSM (09/15/2008) SLEEP APNEA (08/18/2006)  Plan. Patient is ruled out for myocardial infarction enzymes are negative he has no PE or dissection on CT angiogram. This chest pain is atypical. Does have a slightly elevated white count.  We'll arrange for the patient at the outpatient nuclear stress test. We'll also arrange lower extremity arterial Dopplers and as an outpatient. Likely need titration of his diuretics.   HAGER,BRYAN W 05/10/2011, 5:59 PM   I have seen and examined the patient along with Dwana Melena, PA.  I have reviewed the chart, notes and new data.  I agree with PA's note.  Key new complaints: Occasional chest and epigastric twinges when he moves. Appetite is fair. Key examination changes: minimally tender epigastrium, no Murphy's sign; no fever. Key new findings / data: gallstones w/o CT findings of cholecystitis  PLAN: Cardiac findings are benign and further workup can be done as an outpatient. Should also have outpatient leg arterial Dopplers.  However, I wonder if his symptoms might be an expression of gallstone pancreatitis (pain was sharp and radiated to back, elevated WBC, gallstones albeit w/o LFT abnormalities). Will check amylase and lipase. If normal, he can have further cardiac w/u as an outpatient.  Thurmon Fair, MD, [facility] [phone] 05/10/2011, 6:29 PM

## 2011-05-10 NOTE — H&P (Signed)
PCP:   Rogelia Boga, MD, MD   Chief Complaint: Chest tightness   HPI: Corey Mcdonald is an 78 y.o. 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 He also has associated shortness of breath, and mild diaphoresis. He stated his chest discomfort was different than when he had his MI. At that time, he has bilateral shoulder pain and jaw pain as well.  This pain seems to be associated with movement of his neck. Evaluation in the emergency room included a CT pulmonary angiogram which showed no PE and no dissection, EKG showed no acute ST elevation or ischemic changes, troponin was negative. His electrolytes renal function test was normal. Due to his known coronary disease, hospitalist was asked to admit him for cardiac rule out.  Rewiew of Systems:  The patient denies anorexia, fever, weight loss,, vision loss, decreased hearing, hoarseness, syncope, dyspnea on exertion, peripheral edema, balance deficits, hemoptysis, abdominal pain, melena, hematochezia, severe indigestion/heartburn, hematuria, incontinence, genital sores, muscle weakness, suspicious skin lesions, transient blindness, difficulty walking, depression, unusual weight change, abnormal bleeding, enlarged lymph nodes, angioedema, and breast masses   Past Medical History  Diagnosis Date  . ABDOMINAL AORTIC ANEURYSM 09/15/2008  . Atrial fibrillation 10/10/2006  . Atrial flutter 08/18/2006  . BENIGN PROSTATIC HYPERTROPHY, HX OF 08/18/2006  . COLONIC POLYPS, HX OF 08/18/2006  . HYPERLIPIDEMIA 12/04/2009  . HYPERTENSION 08/18/2006  . HYPOTENSION 09/04/2008  . MACULAR DEGENERATION 08/18/2006  . PEPTIC ULCER DISEASE, HX OF 08/18/2006  . RESTLESS LEGS SYNDROME 12/04/2009  . SLEEP APNEA  08/18/2006  . VERTIGO 07/28/2008  . WEAKNESS 06/23/2008  . CORONARY ARTERY DISEASE     non-ST segment MI, March 15, 2010  . Anemia January 2012    admitted with acute MI March 15, 2010 and has significant acute blood loss anemia requiring transfusions of two units of packed RBCs.  Status post upper endoscopy March 22, 2010 without High-Risk bleeding lesion.  History of peptic ulcer disease  . Myocardial infarction 02/2010  . Leg pain   . AAA (abdominal aortic aneurysm)     Past Surgical History  Procedure Date  . Inguinal hernia repair     Left inguinal  . Coronary artery bypass graft 1997    Medications:  HOME MEDS: Prior to Admission medications   Medication Sig Start Date End Date Taking? Authorizing Provider  aspirin 81 MG tablet Take 81 mg by mouth daily.     Yes Historical Provider, MD  diltiazem (CARDIZEM CD) 180 MG 24 hr capsule Take 180 mg by mouth daily with breakfast.     Yes Historical Provider, MD  docusate sodium (COLACE) 100 MG capsule Take 100 mg by mouth daily.    Yes Historical Provider, MD  hydrochlorothiazide (MICROZIDE) 12.5 MG capsule Take 12.5 mg by mouth every Monday, Wednesday, and Friday.   Yes Historical Provider, MD  metoprolol (LOPRESSOR) 25 MG tablet Take 25 mg by mouth 2 (two) times daily.    Yes Historical Provider, MD  Multiple Vitamin (MULTIVITAMIN) tablet Take 1 tablet by mouth daily.    Yes Historical Provider, MD  nitroGLYCERIN (NITROSTAT) 0.4 MG SL tablet Place 0.4 mg under the tongue every 5 (five) minutes as needed. FOR CHEST PAIN    Yes Historical Provider, MD  pantoprazole (PROTONIX) 40 MG tablet Take 40 mg  by mouth daily.    Yes Historical Provider, MD  rosuvastatin (CRESTOR) 10 MG tablet Take 10 mg by mouth every evening.   09/02/10  Yes Gordy Savers, MD  diltiazem (CARDIZEM CD) 180 MG 24 hr capsule TAKE ONE CAPSULE BY MOUTH EVERY DAY 01/20/11   Gordy Savers, MD     Allergies:  Allergies  Allergen Reactions  .  Cilostazol Other (See Comments)    Adverse reaction per patient  . Doxazosin Other (See Comments)    INTOLERANCE     Social History:   reports that he quit smoking about 28 years ago. His smoking use included Cigarettes. He quit after 40 years of use. He has never used smokeless tobacco. He reports that he does not drink alcohol or use illicit drugs.  Family History: Family History  Problem Relation Age of Onset  . Heart disease Mother   . Heart disease Father   . Heart disease Sister   . Macular degeneration Sister   . Heart disease Sister      Physical Exam: Filed Vitals:   05/09/11 2350 05/09/11 2355 05/10/11 0451  BP: 144/65 151/60 176/74  Pulse: 64 60 77  Temp: 97.6 F (36.4 C)  97.9 F (36.6 C)  TempSrc: Oral  Oral  Resp: 20 20 16   Height:   5\' 11"  (1.803 m)  Weight:   100.426 kg (221 lb 6.4 oz)  SpO2: 93% 97% 96%   Blood pressure 176/74, pulse 77, temperature 97.9 F (36.6 C), temperature source Oral, resp. rate 16, height 5\' 11"  (1.803 m), weight 100.426 kg (221 lb 6.4 oz), SpO2 96.00%.  GEN:  Pleasant person lying in the stretcher in no acute distress; cooperative with exam PSYCH:  alert and oriented x4; does not appear anxious does not appear depressed; affect is normal HEENT: Mucous membranes pink and anicteric; PERRLA; EOM intact; no cervical lymphadenopathy nor thyromegaly or carotid bruit; no JVD; Breasts:: Not examined CHEST WALL: No tenderness CHEST: Normal respiration, clear to auscultation bilaterally HEART: Regular rate and rhythm; soft murmur at the left sternal border. BACK: No kyphosis or scoliosis; no CVA tenderness ABDOMEN: Obese, soft non-tender; no masses, no organomegaly, normal abdominal bowel sounds; no pannus; no intertriginous candida. Rectal Exam: Not done EXTREMITIES: No bone or joint deformity; age-appropriate arthropathy of the hands and knees; no edema; no ulcerations. Genitalia: not examined PULSES: 2+ and symmetric SKIN: Normal  hydration no rash or ulceration CNS: Cranial nerves 2-12 grossly intact no focal neurologic deficit   Labs & Imaging Results for orders placed during the hospital encounter of 05/09/11 (from the past 48 hour(s))  D-DIMER, QUANTITATIVE     Status: Abnormal   Collection Time   05/10/11 12:06 AM      Component Value Range Comment   D-Dimer, Quant 1.37 (*) 0.00 - 0.48 (ug/mL-FEU)   CBC     Status: Abnormal   Collection Time   05/10/11 12:06 AM      Component Value Range Comment   WBC 12.2 (*) 4.0 - 10.5 (K/uL)    RBC 4.30  4.22 - 5.81 (MIL/uL)    Hemoglobin 12.5 (*) 13.0 - 17.0 (g/dL)    HCT 40.9 (*) 81.1 - 52.0 (%)    MCV 87.4  78.0 - 100.0 (fL)    MCH 29.1  26.0 - 34.0 (pg)    MCHC 33.2  30.0 - 36.0 (g/dL)    RDW 91.4  78.2 - 95.6 (%)    Platelets 259  150 - 400 (  K/uL)   DIFFERENTIAL     Status: Abnormal   Collection Time   05/10/11 12:06 AM      Component Value Range Comment   Neutrophils Relative 72  43 - 77 (%)    Neutro Abs 8.8 (*) 1.7 - 7.7 (K/uL)    Lymphocytes Relative 15  12 - 46 (%)    Lymphs Abs 1.8  0.7 - 4.0 (K/uL)    Monocytes Relative 11  3 - 12 (%)    Monocytes Absolute 1.4 (*) 0.1 - 1.0 (K/uL)    Eosinophils Relative 2  0 - 5 (%)    Eosinophils Absolute 0.3  0.0 - 0.7 (K/uL)    Basophils Relative 0  0 - 1 (%)    Basophils Absolute 0.0  0.0 - 0.1 (K/uL)   APTT     Status: Normal   Collection Time   05/10/11 12:06 AM      Component Value Range Comment   aPTT 31  24 - 37 (seconds)   PROTIME-INR     Status: Normal   Collection Time   05/10/11 12:06 AM      Component Value Range Comment   Prothrombin Time 12.7  11.6 - 15.2 (seconds)    INR 0.93  0.00 - 1.49    TROPONIN I     Status: Normal   Collection Time   05/10/11 12:11 AM      Component Value Range Comment   Troponin I <0.30  <0.30 (ng/mL)   POCT I-STAT TROPONIN I     Status: Normal   Collection Time   05/10/11 12:26 AM      Component Value Range Comment   Troponin i, poc 0.01  0.00 - 0.08 (ng/mL)     Comment 3            POCT I-STAT, CHEM 8     Status: Abnormal   Collection Time   05/10/11 12:28 AM      Component Value Range Comment   Sodium 140  135 - 145 (mEq/L)    Potassium 4.6  3.5 - 5.1 (mEq/L)    Chloride 103  96 - 112 (mEq/L)    BUN 18  6 - 23 (mg/dL)    Creatinine, Ser 4.09  0.50 - 1.35 (mg/dL)    Glucose, Bld 811 (*) 70 - 99 (mg/dL)    Calcium, Ion 9.14  1.12 - 1.32 (mmol/L)    TCO2 28  0 - 100 (mmol/L)    Hemoglobin 13.3  13.0 - 17.0 (g/dL)    HCT 78.2  95.6 - 21.3 (%)    Ct Angio Chest W/cm &/or Wo Cm  05/10/2011  *RADIOLOGY REPORT*  Clinical Data: Evaluate for pulmonary embolus.  Rule out dissection.  CT ANGIOGRAPHY CHEST  Technique:  Multidetector CT imaging of the chest using the standard protocol during bolus administration of intravenous contrast. Multiplanar reconstructed images including MIPs were obtained and reviewed to evaluate the vascular anatomy.  Contrast: OMNIPAQUE IOHEXOL 350 MG/ML IV SOLN  Comparison: 05/10/2011  Findings: No abnormal filling defects are identified within the main pulmonary artery or their branches to suggest an acute pulmonary embolus.  There is calcified atherosclerotic disease involving the thoracic aorta and its branches.  No evidence for thoracic aortic dissection.  There is a small hiatal hernia noted.  No enlarged axillary or supraclavicular lymph nodes.  There is no enlarged mediastinal or hilar adenopathy.  No pericardial or pleural effusion.  Prior median sternotomy and CABG.  Mild  emphysema.  No airspace consolidation identified.  No suspicious pulmonary nodule or mass identified.  There is mild multilevel thoracic spondylosis identified.  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  Stones are noted within the dependent portion of the gallbladder.  There are cysts identified within the upper poles of both kidneys.  IMPRESSION:  1.  There are no specific features identified to suggest an acute pulmonary embolus.  No evidence for  aortic dissection. 2.  Hiatal hernia. 3.  No acute cardiopulmonary abnormalities. 4.  Emphysema.  Original Report Authenticated By: Rosealee Albee, M.D.   Dg Chest Port 1 View  05/10/2011  *RADIOLOGY REPORT*  Clinical Data: Chest pain and shortness of breath.  PORTABLE CHEST - 1 VIEW  Comparison: Chest radiograph performed 12/24/2010  Findings: The lungs are well-aerated.  Vascular congestion is noted, with mildly increased interstitial markings, raising concern for minimal interstitial edema.  There is no evidence of pleural effusion or pneumothorax.  The cardiomediastinal silhouette is borderline normal in size; the patient is status post median sternotomy, with evidence of prior CABG.  Calcification is noted in the aortic arch.  No acute osseous abnormalities are seen.  IMPRESSION: Vascular congestion, with mildly increased interstitial markings, raising concern for minimal interstitial edema.  Original Report Authenticated By: Tonia Ghent, M.D.      Assessment Present on Admission:  .Chest pain, atypical .Atrial fibrillation .ABDOMINAL AORTIC ANEURYSM .CORONARY ARTERY DISEASE .HYPERTENSION .SLEEP APNEA .HYPERLIPIDEMIA   PLAN: Given that he has known coronary disease, will admit him for rule out. I do not think that this represents cardiac in its etiology. Will obtain an echo to evaluate wall motion abnormalities. Obviously he'll need cardiology consult and further evaluation. He is stable, full code, and will be admitted to [facility].   Other plans as per orders.    Jordell Outten 05/10/2011, 7:37 AM

## 2011-05-11 ENCOUNTER — Inpatient Hospital Stay (HOSPITAL_COMMUNITY): Payer: Medicare Other

## 2011-05-11 MED ORDER — PREDNISOLONE ACETATE 1 % OP SUSP
1.0000 [drp] | Freq: Three times a day (TID) | OPHTHALMIC | Status: DC
  Administered 2011-05-11 (×2): 1 [drp] via OPHTHALMIC

## 2011-05-11 MED ORDER — BESIFLOXACIN HCL 0.6 % OP SUSP
1.0000 [drp] | Freq: Three times a day (TID) | OPHTHALMIC | Status: DC
  Administered 2011-05-11 (×2): 1 [drp] via OPHTHALMIC

## 2011-05-11 MED ORDER — PREDNISOLONE ACETATE 1 % OP SUSP: 1.0000 [drp] | Freq: Every day | OPHTHALMIC | Status: DC

## 2011-05-11 MED ORDER — NEPAFENAC 0.1 % OP SUSP
1.0000 [drp] | Freq: Three times a day (TID) | OPHTHALMIC | Status: DC
  Administered 2011-05-11 (×2): 1 [drp] via OPHTHALMIC

## 2011-05-11 NOTE — Discharge Summary (Signed)
Physician Discharge Summary  Patient ID: Corey Mcdonald MRN: [id number] DOB/AGE: 10/16/1923 78 y.o.  Admit date: 05/09/2011 Discharge date: 05/11/2011  Primary Care Physician:  Rogelia Boga, MD, MD   Discharge Diagnoses:    Principal Problem:  *Chest pain, atypical Active Problems:  HYPERLIPIDEMIA  HYPERTENSION  CORONARY ARTERY DISEASE  Atrial fibrillation  ABDOMINAL AORTIC ANEURYSM  SLEEP APNEA    Medication List  As of 05/11/2011  6:16 PM   TAKE these medications         aspirin 81 MG tablet   Take 81 mg by mouth daily.      BESIVANCE 0.6 % Susp   Generic drug: Besifloxacin HCl   Place 1 drop into both eyes 4 (four) times daily.      diltiazem 180 MG 24 hr capsule   Commonly known as: CARDIZEM CD   Take 180 mg by mouth daily with breakfast.      docusate sodium 100 MG capsule   Commonly known as: COLACE   Take 100 mg by mouth daily.      hydrochlorothiazide 12.5 MG capsule   Commonly known as: MICROZIDE   Take 12.5 mg by mouth every Monday, Wednesday, and Friday.      metoprolol tartrate 25 MG tablet   Commonly known as: LOPRESSOR   Take 25 mg by mouth 2 (two) times daily.      multivitamin tablet   Take 1 tablet by mouth daily.      nepafenac 0.1 % ophthalmic suspension   Commonly known as: NEVANAC   Place 1 drop into both eyes 3 (three) times daily.      nitroGLYCERIN 0.4 MG SL tablet   Commonly known as: NITROSTAT   Place 0.4 mg under the tongue every 5 (five) minutes as needed. FOR CHEST PAIN        pantoprazole 40 MG tablet   Commonly known as: PROTONIX   Take 40 mg by mouth daily.      PRESCRIPTION MEDICATION   Place 1 drop into both eyes 3 (three) times daily. "falcon eye drops" (samplemed  from dr Mercy Riding)      rosuvastatin 10 MG tablet   Commonly known as: CRESTOR   Take 10 mg by mouth every evening.             Disposition and Follow-up:  Is to follow up with his primary care physician in 1 to 2 weeks.  Also is to  follow up with Cardiologist as indicated by them for follow up doppler studies.  Consults:  cardiology (Dr. Rennis Golden at [address] [facility] [address]) Phone number [phone]   Significant Diagnostic Studies:  Ct Angio Chest W/cm &/or Wo Cm  05/10/2011  *RADIOLOGY REPORT*  Clinical Data: Evaluate for pulmonary embolus.  Rule out dissection.  CT ANGIOGRAPHY CHEST  Technique:  Multidetector CT imaging of the chest using the standard protocol during bolus administration of intravenous contrast. Multiplanar reconstructed images including MIPs were obtained and reviewed to evaluate the vascular anatomy.  Contrast: OMNIPAQUE IOHEXOL 350 MG/ML IV SOLN  Comparison: 05/10/2011  Findings: No abnormal filling defects are identified within the main pulmonary artery or their branches to suggest an acute pulmonary embolus.  There is calcified atherosclerotic disease involving the thoracic aorta and its branches.  No evidence for thoracic aortic dissection.  There is a small hiatal hernia noted.  No enlarged axillary or supraclavicular lymph nodes.  There is no enlarged mediastinal or hilar adenopathy.  No pericardial or pleural effusion.  Prior median sternotomy and CABG.  Mild emphysema.  No airspace consolidation identified.  No suspicious pulmonary nodule or mass identified.  There is mild multilevel thoracic spondylosis identified.  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  Stones are noted within the dependent portion of the gallbladder.  There are cysts identified within the upper poles of both kidneys.  IMPRESSION:  1.  There are no specific features identified to suggest an acute pulmonary embolus.  No evidence for aortic dissection. 2.  Hiatal hernia. 3.  No acute cardiopulmonary abnormalities. 4.  Emphysema.  Original Report Authenticated By: Rosealee Albee, M.D.   Dg Chest Port 1 View  05/10/2011  *RADIOLOGY REPORT*  Clinical Data: Chest pain and shortness of breath.  PORTABLE CHEST - 1 VIEW   Comparison: Chest radiograph performed 12/24/2010  Findings: The lungs are well-aerated.  Vascular congestion is noted, with mildly increased interstitial markings, raising concern for minimal interstitial edema.  There is no evidence of pleural effusion or pneumothorax.  The cardiomediastinal silhouette is borderline normal in size; the patient is status post median sternotomy, with evidence of prior CABG.  Calcification is noted in the aortic arch.  No acute osseous abnormalities are seen.  IMPRESSION: Vascular congestion, with mildly increased interstitial markings, raising concern for minimal interstitial edema.  Original Report Authenticated By: Tonia Ghent, M.D.    Brief H and P: For complete details please refer to admission H and P, but in brief   Corey Mcdonald is an 78 y.o. 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  Patient was evaluated by Cardiology and chest discomfort was labeled as atypical and non cardiac as patient's cardiac enzymes were negative.  Amylase and Lipase were negative and abdominal ultrasound showed small gallstones with no pain over the gallbladder.  No ductal dilatation.  Initial CT of chest was negative for acute pulmonary embolus. No evidence for aortic dissection or acute cardiopulmonary abnormalities.  Follow up as indicated below.  Patient will need to continue home regimen.   Follow up with: Cardiology (Dr. Rennis Golden at [address] [facility] [address]) Phone number [phone]  Time spent on Discharge: 35 minutes  Signed: Penny Pia [facility] Pager: [phone] 05/11/2011, 6:16 PM

## 2011-05-11 NOTE — Progress Notes (Signed)
Pt. Seen and examined. Agree with the NP/PA-C note as written.  Chest pain free. Pain was atypical per Dr. Royann Shivers. Amylase and lipase are normal. Vitals stable. Troponins negative x 4. Ok for discharge today. Outpatient LE dopplers.  Chrystie Nose, MD, [facility] Attending Cardiologist [facility]

## 2011-05-11 NOTE — Progress Notes (Signed)
Subjective:  No chest pain  Objective:  Vital Signs in the last 24 hours: Temp:  [97.8 F (36.6 C)-98.2 F (36.8 C)] 98.1 F (36.7 C) (03/20 0500) Pulse Rate:  [63-82] 63  (03/20 1032) Resp:  [18] 18  (03/20 0500) BP: (134-160)/(54-68) 142/68 mmHg (03/20 1032) SpO2:  [95 %-99 %] 95 % (03/20 0500) Weight:  [100.109 kg (220 lb 11.2 oz)] 100.109 kg (220 lb 11.2 oz) (03/20 0500)  Intake/Output from previous day: No intake or output data in the 24 hours ending 05/11/11 1100  Physical Exam: General appearance: alert, cooperative, no distress and mildly obese Lungs: clear to auscultation bilaterally Heart: regular rate and rhythm   Rate: 65  Rhythm: normal sinus rhythm  Lab Results:  Basename 05/10/11 0635 05/10/11 0028 05/10/11 0006  WBC 12.0* -- 12.2*  HGB 12.7* 13.3 --  PLT 240 -- 259    Basename 05/10/11 0635 05/10/11 0028  NA 139 140  K 4.7 4.6  CL 103 103  CO2 24 --  GLUCOSE 126* 100*  BUN 16 18  CREATININE 1.03 1.10    Basename 05/10/11 2101 05/10/11 1307  TROPONINI <0.30 <0.30   Hepatic Function Panel No results found for this basename: PROT,ALBUMIN,AST,ALT,ALKPHOS,BILITOT,BILIDIR,IBILI in the last 72 hours No results found for this basename: CHOL in the last 72 hours  Basename 05/10/11 0006  INR 0.93    Imaging: Ct Angio Chest W/cm &/or Wo Cm  05/10/2011  *RADIOLOGY REPORT*  Clinical Data: Evaluate for pulmonary embolus.  Rule out dissection.  CT ANGIOGRAPHY CHEST  Technique:  Multidetector CT imaging of the chest using the standard protocol during bolus administration of intravenous contrast. Multiplanar reconstructed images including MIPs were obtained and reviewed to evaluate the vascular anatomy.  Contrast: OMNIPAQUE IOHEXOL 350 MG/ML IV SOLN  Comparison: 05/10/2011  Findings: No abnormal filling defects are identified within the main pulmonary artery or their branches to suggest an acute pulmonary embolus.  There is calcified atherosclerotic  disease involving the thoracic aorta and its branches.  No evidence for thoracic aortic dissection.  There is a small hiatal hernia noted.  No enlarged axillary or supraclavicular lymph nodes.  There is no enlarged mediastinal or hilar adenopathy.  No pericardial or pleural effusion.  Prior median sternotomy and CABG.  Mild emphysema.  No airspace consolidation identified.  No suspicious pulmonary nodule or mass identified.  There is mild multilevel thoracic spondylosis identified.  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  Stones are noted within the dependent portion of the gallbladder.  There are cysts identified within the upper poles of both kidneys.  IMPRESSION:  1.  There are no specific features identified to suggest an acute pulmonary embolus.  No evidence for aortic dissection. 2.  Hiatal hernia. 3.  No acute cardiopulmonary abnormalities. 4.  Emphysema.  Original Report Authenticated By: Rosealee Albee, M.D.   US Abdomen Complete  05/11/2011  *RADIOLOGY REPORT*  Clinical Data:  Abdominal pain, possible pancreatitis  COMPLETE ABDOMINAL ULTRASOUND  Comparison:  CT abdomen pelvis of 01/25/2011  Findings:  Gallbladder:  The gallbladder is visualized and there are small gallstones layering within the gallbladder.  There is no pain over the gallbladder with compression.  Common bile duct:  The common bile duct is normal measuring 6.3 mm in diameter.  Liver:  The liver has a normal echogenic pattern.  No ductal dilatation is seen through  IVC:  It  Pancreas:  The pancreas is obscured by bowel gas and cannot be evaluated by ultrasound.  Spleen:  The spleen is normal measuring 7.1 cm sagittally.  Right Kidney:  No hydronephrosis is seen.  The right kidney measures 9.8 cm sagittally.  There is a cyst present of 1.6 x 1.3 x 1.8 cm.  Left Kidney:  No hydronephrosis is noted.  The left kidney measures 9.8 cm.  A complex cyst is present of 3.0 x 2.4 x 2.3 cm.  Abdominal aorta:  AAA stent repair is noted, with  blood flow demonstrated within the lumen of the stent.  The native aneurysm measures 5.4 x 6.4 cm in maximum diameter.  IMPRESSION:  1.  Small gallstones.  No pain over the gallbladder.  No ductal dilatation. 2.  The pancreas is obscured by bowel gas. 3.  AAA stent present as described above for repair of abdominal aortic aneurysm.  Original Report Authenticated By: Juline Patch, M.D.   Dg Chest Port 1 View  05/10/2011  *RADIOLOGY REPORT*  Clinical Data: Chest pain and shortness of breath.  PORTABLE CHEST - 1 VIEW  Comparison: Chest radiograph performed 12/24/2010  Findings: The lungs are well-aerated.  Vascular congestion is noted, with mildly increased interstitial markings, raising concern for minimal interstitial edema.  There is no evidence of pleural effusion or pneumothorax.  The cardiomediastinal silhouette is borderline normal in size; the patient is status post median sternotomy, with evidence of prior CABG.  Calcification is noted in the aortic arch.  No acute osseous abnormalities are seen.  IMPRESSION: Vascular congestion, with mildly increased interstitial markings, raising concern for minimal interstitial edema.  Original Report Authenticated By: Tonia Ghent, M.D.    Cardiac Studies:  Assessment/Plan:   Principal Problem:  *Chest pain, atypical Active Problems:  HYPERLIPIDEMIA  HYPERTENSION  CORONARY ARTERY DISEASE  Atrial fibrillation  ABDOMINAL AORTIC ANEURYSM  SLEEP APNEA  Plan- will arrange follow up dopplers of LE and office visit.    [employer] PA-C 05/11/2011, 11:00 AM

## 2011-05-11 NOTE — Progress Notes (Signed)
   CARE MANAGEMENT NOTE 05/11/2011  Patient:  [facility]   Account Number:  [id number]  Date Initiated:  05/10/2011  Documentation initiated by:  Junius Creamer  Subjective/Objective Assessment:   adm w ch pain     Action/Plan:   lives w wife, pcp dr Juanita Laster   Anticipated DC Date:  05/11/2011   Anticipated DC Plan:  HOME/SELF CARE      DC Planning Services  CM consult      Choice offered to / List presented to:             Status of service:   Medicare Important Message given?   (If response is "NO", the following Medicare IM given date fields will be blank) Date Medicare IM given:   Date Additional Medicare IM given:    Discharge Disposition:    Per UR Regulation:    If discussed at Long Length of Stay Meetings, dates discussed:    Comments:  3/19 debbie dowell rn,bsn [phone]  05/11/11 Ellyana Crigler, RN,BSN [phone] SPOKE WITH THE PT AND HE STATED THAT HE IS AT HOME WITH HIS WIFE AND THEY ARE HOMEBOUND UNLESS THEIR DAUGHTER TAKES THEM OUT.  I HAVE GIVEN HIM A LIST OF PRIVATE DUTY AGENCIES.  PT IS INTERESTED IN GETTING SOMEHELP SO THAT ALL OF THE RESPONSIBILITY IS NOT ON THE DAUGHTER.  PT STATED THAT HE HAS A POTTY CHAIR, WALKER AND A WHEELCHAIR AT HOME READY TO USE WHEN NEEDED.  WILL F/U ON DC NEEDS.

## 2011-05-23 DIAGNOSIS — Z9289 Personal history of other medical treatment: Secondary | ICD-10-CM

## 2011-05-23 HISTORY — DX: Personal history of other medical treatment: Z92.89

## 2011-07-08 ENCOUNTER — Encounter: Payer: Self-pay | Admitting: Internal Medicine

## 2011-07-08 ENCOUNTER — Ambulatory Visit (INDEPENDENT_AMBULATORY_CARE_PROVIDER_SITE_OTHER): Payer: Medicare Other | Admitting: Internal Medicine

## 2011-07-08 VITALS — BP 110/60 | Wt 225.0 lb

## 2011-07-08 DIAGNOSIS — E785 Hyperlipidemia, unspecified: Secondary | ICD-10-CM

## 2011-07-08 DIAGNOSIS — I10 Essential (primary) hypertension: Secondary | ICD-10-CM

## 2011-07-08 DIAGNOSIS — I4891 Unspecified atrial fibrillation: Secondary | ICD-10-CM

## 2011-07-08 MED ORDER — HYDROCHLOROTHIAZIDE 25 MG PO TABS: 25.0000 mg | ORAL_TABLET | Freq: Every day | ORAL | Status: DC

## 2011-07-08 NOTE — Progress Notes (Signed)
  Subjective:    Patient ID: Corey Mcdonald, male    DOB: 11/24/1923, 78 y.o.   MRN: [id number]  HPI 78 year old patient who is seen today for followup. He is followed by cardiology and recently hydrochlorothiazide was increased to 25 mg daily new to mild peripheral edema and elevated systolic readings. He was also given a prescription for lisinopril 10 mg daily which she has not started taking he is doing quite well today denies any cardiopulmonary complaints he has a history of coronary artery disease paroxysmal atrial fibrillation and dyslipidemia. Presently is on aspirin therapy only  Wt Readings from Last 3 Encounters:  07/08/11 225 lb (102.059 kg)  05/11/11 220 lb 11.2 oz (100.109 kg)  03/11/11 225 lb (102.059 kg)    Review of Systems  Constitutional: Negative for fever, chills, appetite change and fatigue.  HENT: Negative for hearing loss, ear pain, congestion, sore throat, trouble swallowing, neck stiffness, dental problem, voice change and tinnitus.   Eyes: Negative for pain, discharge and visual disturbance.  Respiratory: Negative for cough, chest tightness, wheezing and stridor.   Cardiovascular: Positive for leg swelling. Negative for chest pain and palpitations.  Gastrointestinal: Negative for nausea, vomiting, abdominal pain, diarrhea, constipation, blood in stool and abdominal distention.  Genitourinary: Negative for urgency, hematuria, flank pain, discharge, difficulty urinating and genital sores.  Musculoskeletal: Negative for myalgias, back pain, joint swelling, arthralgias and gait problem.  Skin: Negative for rash.  Neurological: Negative for dizziness, syncope, speech difficulty, weakness, numbness and headaches.  Hematological: Negative for adenopathy. Does not bruise/bleed easily.  Psychiatric/Behavioral: Negative for behavioral problems and dysphoric mood. The patient is not nervous/anxious.        Objective:   Physical Exam  Constitutional: He is oriented to person,  place, and time. He appears well-developed.       Blood pressure 120/60  HENT:  Head: Normocephalic.  Right Ear: External ear normal.  Left Ear: External ear normal.  Eyes: Conjunctivae and EOM are normal.  Neck: Normal range of motion.  Cardiovascular: Normal rate and normal heart sounds.   Pulmonary/Chest: Breath sounds normal.  Abdominal: Bowel sounds are normal.  Musculoskeletal: Normal range of motion. He exhibits edema. He exhibits no tenderness.       Trace edema  Neurological: He is alert and oriented to person, place, and time.  Psychiatric: He has a normal mood and affect. His behavior is normal.          Assessment & Plan:   Hypertension. Reasonable control at present with the increased diuretic therapy Coronary artery disease stable Dyslipidemia stable   Low-salt diet recommended. Weight loss recommended. We'll recheck in 4 months

## 2011-07-08 NOTE — Patient Instructions (Signed)
Limit your sodium (Salt) intake  You need to lose weight.  Consider a lower calorie diet and regular exercise.  Return in 4 months for follow-up  

## 2011-07-29 ENCOUNTER — Other Ambulatory Visit: Payer: Self-pay | Admitting: Vascular Surgery

## 2011-07-29 LAB — CREATININE, SERUM: Creat: 1.16 mg/dL (ref 0.50–1.35)

## 2011-07-29 LAB — BUN: BUN: 20 mg/dL (ref 6–23)

## 2011-08-01 ENCOUNTER — Encounter: Payer: Self-pay | Admitting: Vascular Surgery

## 2011-08-02 ENCOUNTER — Ambulatory Visit
Admission: RE | Admit: 2011-08-02 | Discharge: 2011-08-02 | Disposition: A | Discharge status: Home / Self Care | Payer: Medicare Other | Source: Ambulatory Visit | Attending: Vascular Surgery | Admitting: Vascular Surgery

## 2011-08-02 ENCOUNTER — Encounter: Payer: Self-pay | Admitting: Vascular Surgery

## 2011-08-02 ENCOUNTER — Ambulatory Visit (INDEPENDENT_AMBULATORY_CARE_PROVIDER_SITE_OTHER): Payer: Medicare Other | Admitting: Vascular Surgery

## 2011-08-02 VITALS — BP 135/69 | HR 69 | Temp 97.8°F | Ht 71.0 in | Wt 226.0 lb

## 2011-08-02 DIAGNOSIS — Z48812 Encounter for surgical aftercare following surgery on the circulatory system: Secondary | ICD-10-CM

## 2011-08-02 DIAGNOSIS — I714 Abdominal aortic aneurysm, without rupture: Secondary | ICD-10-CM

## 2011-08-02 MED ORDER — IOHEXOL 350 MG/ML SOLN
100.0000 mL | Freq: Once | INTRAVENOUS | Status: AC | PRN
  Administered 2011-08-02: 100 mL via INTRAVENOUS

## 2011-08-02 NOTE — Progress Notes (Signed)
The patient presents for followup of his abdominal aortic aneurysm repair with stent graft December 2012. He is doing quite well and is active at his age of 66. He does have some shortness of breath with exertion. He denies any new cardiac difficulties he does not have any abdominal or groin pain.  Past Medical History  Diagnosis Date  . ABDOMINAL AORTIC ANEURYSM 09/15/2008  . Atrial fibrillation 10/10/2006  . Atrial flutter 08/18/2006  . BENIGN PROSTATIC HYPERTROPHY, HX OF 08/18/2006  . COLONIC POLYPS, HX OF 08/18/2006  . HYPERLIPIDEMIA 12/04/2009  . HYPERTENSION 08/18/2006  . HYPOTENSION 09/04/2008  . MACULAR DEGENERATION 08/18/2006  . PEPTIC ULCER DISEASE, HX OF 08/18/2006  . RESTLESS LEGS SYNDROME 12/04/2009  . SLEEP APNEA 08/18/2006  . VERTIGO 07/28/2008  . WEAKNESS 06/23/2008  . CORONARY ARTERY DISEASE     non-ST segment MI, March 15, 2010  . Anemia January 2012    admitted with acute MI March 15, 2010 and has significant acute blood loss anemia requiring transfusions of two units of packed RBCs.  Status post upper endoscopy March 22, 2010 without High-Risk bleeding lesion.  History of peptic ulcer disease  . Myocardial infarction 02/2010  . Leg pain   . AAA (abdominal aortic aneurysm)     History  Substance Use Topics  . Smoking status: Former Smoker -- 40 years    Types: Cigarettes    Quit date: 02/22/1983  . Smokeless tobacco: Never Used  . Alcohol Use: No    Family History  Problem Relation Age of Onset  . Heart disease Mother   . Heart disease Father   . Heart disease Sister   . Macular degeneration Sister   . Heart disease Sister     Allergies  Allergen Reactions  . Cilostazol Other (See Comments)    Adverse reaction per patient  . Doxazosin Other (See Comments)    INTOLERANCE     Current outpatient prescriptions:aspirin 81 MG tablet, Take 81 mg by mouth daily.  , Disp: , Rfl: ;  diltiazem (CARDIZEM CD) 180 MG 24 hr capsule, Take 180 mg by mouth daily with  breakfast.  , Disp: , Rfl: ;  docusate sodium (COLACE) 100 MG capsule, Take 100 mg by mouth daily. , Disp: , Rfl: ;  hydrochlorothiazide (HYDRODIURIL) 25 MG tablet, Take 1 tablet (25 mg total) by mouth daily., Disp: 90 tablet, Rfl: 3 metoprolol (LOPRESSOR) 25 MG tablet, Take 25 mg by mouth 2 (two) times daily. , Disp: , Rfl: ;  Multiple Vitamin (MULTIVITAMIN) tablet, Take 1 tablet by mouth daily. , Disp: , Rfl: ;  nitroGLYCERIN (NITROSTAT) 0.4 MG SL tablet, Place 0.4 mg under the tongue every 5 (five) minutes as needed. FOR CHEST PAIN , Disp: , Rfl: ;  pantoprazole (PROTONIX) 40 MG tablet, Take 40 mg by mouth daily. , Disp: , Rfl:  rosuvastatin (CRESTOR) 10 MG tablet, Take 10 mg by mouth every evening.  , Disp: , Rfl: ;  Besifloxacin HCl (BESIVANCE) 0.6 % SUSP, Place 1 drop into both eyes 4 (four) times daily., Disp: , Rfl: ;  lisinopril (PRINIVIL,ZESTRIL) 10 MG tablet, , Disp: , Rfl: ;  nepafenac (NEVANAC) 0.1 % ophthalmic suspension, Place 1 drop into both eyes 3 (three) times daily., Disp: , Rfl:  PRESCRIPTION MEDICATION, Place 1 drop into both eyes 3 (three) times daily. "falcon eye drops" (samplemed  from dr Mercy Riding), Disp: , Rfl: ;  DISCONTD: carbidopa-levodopa (SINEMET CR) 25-100 MG per tablet, Take by mouth. 1/2 tablet at bedtime ,  Disp: , Rfl:  No current facility-administered medications for this visit. Facility-Administered Medications Ordered in Other Visits: iohexol (OMNIPAQUE) 350 MG/ML injection 100 mL, 100 mL, Intravenous, Once PRN, Medication Radiologist, MD, 100 mL at 08/02/11 1205  BP 135/69  Pulse 69  Temp(Src) 97.8 F (36.6 C) (Oral)  Ht 5\' 11"  (1.803 m)  Wt 226 lb (102.513 kg)  BMI 31.52 kg/m2  SpO2 90%  Body mass index is 31.52 kg/(m^2).       Physical exam: Obese white male in no acute distress. He has 2+ radial and 2+ groin and 2+ popliteal pulses bilaterally. Groin incisions are well healed. Abdominal exam reveals obesity no tenderness and no palpable aortic  mass.  CT scan was reviewed and discussed with the patient. This shows excellent positioning of the stent graft and no evidence of endoleak. The maximal size of his aneurysm today is 5.9 cm as compared to 6.0 cm in his last study  Impression and plan: Stable status post endovascular repair of abdominal aortic aneurysm. He will continue activity without limitation and we will see him in one year with a repeat CT scan

## 2011-08-03 NOTE — Addendum Note (Signed)
Addended by: Sharee Pimple on: 08/03/2011 09:55 AM   Modules accepted: Orders

## 2011-08-19 ENCOUNTER — Telehealth: Payer: Self-pay | Admitting: Internal Medicine

## 2011-08-19 NOTE — Telephone Encounter (Signed)
  Caller: [name]/Child; PCP: Eleonore Chiquito; CB#: [phone];  Call regarding Constipation;   Pt has had a BM today.  Called to see if pt needs any other laxative other than Colace.  Pt had a BM 07/17/11   before the one this AM.   Triaged Constipation and all emergent SX R/O.  Inst to other Laxative/Miralax needed. Home care and call back inst given.

## 2011-09-02 ENCOUNTER — Telehealth: Payer: Self-pay | Admitting: Internal Medicine

## 2011-09-02 NOTE — Telephone Encounter (Signed)
Pts spouse called and said that her husbands needs to get samples of either rosuvastatin (CRESTOR) 10 MG tablet or 5 mg if avail. Pts spouse has a coumadin lab today at 1:50 pm and is req to pick up samples at time of appt.

## 2011-09-02 NOTE — Telephone Encounter (Signed)
Ready for pick up

## 2011-09-30 ENCOUNTER — Telehealth: Payer: Self-pay | Admitting: Internal Medicine

## 2011-09-30 NOTE — Telephone Encounter (Signed)
done

## 2011-09-30 NOTE — Telephone Encounter (Signed)
Pts spouse called and is req to get samples for Crestor 10 mg or 5 mg. Pts wife is coming in for coumadin clinic today and would like to pick up samples when she comes in.

## 2011-10-19 ENCOUNTER — Other Ambulatory Visit: Payer: Self-pay | Admitting: Internal Medicine

## 2011-10-27 ENCOUNTER — Telehealth: Payer: Self-pay | Admitting: Internal Medicine

## 2011-10-27 NOTE — Telephone Encounter (Signed)
We have none in house as of today. Checked both closets

## 2011-10-27 NOTE — Telephone Encounter (Signed)
Pts daughter called req to get Crestor 5mg  or 10 mg samples. Req to pick up tomorrow by 1pm.

## 2011-11-08 ENCOUNTER — Other Ambulatory Visit: Payer: Self-pay | Admitting: Internal Medicine

## 2011-11-08 NOTE — Telephone Encounter (Signed)
Pt needs samples of crestor 10 mg. Pt wilfe will pick up samples on friday

## 2011-11-08 NOTE — Telephone Encounter (Signed)
None in house at this time - will check on Friday when wife comes by

## 2011-11-11 ENCOUNTER — Encounter: Payer: Self-pay | Admitting: Internal Medicine

## 2011-11-11 ENCOUNTER — Ambulatory Visit (INDEPENDENT_AMBULATORY_CARE_PROVIDER_SITE_OTHER): Payer: Medicare Other | Admitting: Internal Medicine

## 2011-11-11 VITALS — BP 118/70 | Temp 97.5°F | Wt 225.0 lb

## 2011-11-11 DIAGNOSIS — I2510 Atherosclerotic heart disease of native coronary artery without angina pectoris: Secondary | ICD-10-CM

## 2011-11-11 DIAGNOSIS — Z23 Encounter for immunization: Secondary | ICD-10-CM

## 2011-11-11 DIAGNOSIS — I10 Essential (primary) hypertension: Secondary | ICD-10-CM

## 2011-11-11 DIAGNOSIS — E785 Hyperlipidemia, unspecified: Secondary | ICD-10-CM

## 2011-11-11 NOTE — Progress Notes (Signed)
Subjective:    Patient ID: Corey Mcdonald, male    DOB: [birth year], 78 y.o.   MRN: [id number]  HPI  78 year old patient who is seen today for followup. He has a history of coronary artery disease which has been stable. Denies any chest pain. He has treated hypertension and dyslipidemia. He denies any cardiopulmonary complaints.  Past Medical History  Diagnosis Date  . ABDOMINAL AORTIC ANEURYSM 09/15/2008  . Atrial fibrillation 10/10/2006  . Atrial flutter 08/18/2006  . BENIGN PROSTATIC HYPERTROPHY, HX OF 08/18/2006  . COLONIC POLYPS, HX OF 08/18/2006  . HYPERLIPIDEMIA 12/04/2009  . HYPERTENSION 08/18/2006  . HYPOTENSION 09/04/2008  . MACULAR DEGENERATION 08/18/2006  . PEPTIC ULCER DISEASE, HX OF 08/18/2006  . RESTLESS LEGS SYNDROME 12/04/2009  . SLEEP APNEA 08/18/2006  . VERTIGO 07/28/2008  . WEAKNESS 06/23/2008  . CORONARY ARTERY DISEASE     non-ST segment MI, March 15, 2010  . Anemia January 2012    admitted with acute MI March 15, 2010 and has significant acute blood loss anemia requiring transfusions of two units of packed RBCs.  Status post upper endoscopy March 22, 2010 without High-Risk bleeding lesion.  History of peptic ulcer disease  . Myocardial infarction 02/2010  . Leg pain   . AAA (abdominal aortic aneurysm)     History   Social History  . Marital Status: Married    Spouse Name: N/A    Number of Children: N/A  . Years of Education: N/A   Occupational History  . Not on file.   Social History Main Topics  . Smoking status: Former Smoker -- 40 years    Types: Cigarettes    Quit date: 02/22/1983  . Smokeless tobacco: Never Used  . Alcohol Use: No  . Drug Use: No  . Sexually Active: Not on file   Other Topics Concern  . Not on file   Social History Narrative  . No narrative on file    Past Surgical History  Procedure Date  . Inguinal hernia repair     Left inguinal  . Coronary artery bypass graft 1997    Family History  Problem Relation Age of Onset  .  Heart disease Mother   . Heart disease Father   . Heart disease Sister   . Macular degeneration Sister   . Heart disease Sister     Allergies  Allergen Reactions  . Cilostazol Other (See Comments)    Adverse reaction per patient  . Doxazosin Other (See Comments)    INTOLERANCE     Current Outpatient Prescriptions on File Prior to Visit  Medication Sig Dispense Refill  . aspirin 81 MG tablet Take 81 mg by mouth daily.        Marland Kitchen Besifloxacin HCl (BESIVANCE) 0.6 % SUSP Place 1 drop into both eyes 4 (four) times daily.      Marland Kitchen diltiazem (CARDIZEM CD) 180 MG 24 hr capsule Take 180 mg by mouth daily with breakfast.        . diltiazem (CARDIZEM CD) 180 MG 24 hr capsule TAKE ONE CAPSULE BY MOUTH EVERY DAY  90 capsule  3  . docusate sodium (COLACE) 100 MG capsule Take 100 mg by mouth daily.       . hydrochlorothiazide (HYDRODIURIL) 25 MG tablet Take 1 tablet (25 mg total) by mouth daily.  90 tablet  3  . lisinopril (PRINIVIL,ZESTRIL) 10 MG tablet       . metoprolol (LOPRESSOR) 25 MG tablet Take 25 mg by mouth 2 (two) times  daily.       . Multiple Vitamin (MULTIVITAMIN) tablet Take 1 tablet by mouth daily.       . nepafenac (NEVANAC) 0.1 % ophthalmic suspension Place 1 drop into both eyes 3 (three) times daily.      . nitroGLYCERIN (NITROSTAT) 0.4 MG SL tablet Place 0.4 mg under the tongue every 5 (five) minutes as needed. FOR CHEST PAIN       . pantoprazole (PROTONIX) 40 MG tablet Take 40 mg by mouth daily.       Marland Kitchen PRESCRIPTION MEDICATION Place 1 drop into both eyes 3 (three) times daily. "falcon eye drops" (samplemed  from dr Mercy Riding)      . rosuvastatin (CRESTOR) 10 MG tablet Take 10 mg by mouth every evening.        Marland Kitchen DISCONTD: carbidopa-levodopa (SINEMET CR) 25-100 MG per tablet Take by mouth. 1/2 tablet at bedtime         BP 118/70  Temp 97.5 F (36.4 C) (Oral)  Wt 225 lb (102.059 kg)     Wt Readings from Last 3 Encounters:  11/11/11 225 lb (102.059 kg)  08/02/11 226 lb  (102.513 kg)  07/08/11 225 lb (102.059 kg)    Review of Systems  Constitutional: Negative for fever, chills, appetite change and fatigue.  HENT: Negative for hearing loss, ear pain, congestion, sore throat, trouble swallowing, neck stiffness, dental problem, voice change and tinnitus.   Eyes: Negative for pain, discharge and visual disturbance.  Respiratory: Negative for cough, chest tightness, wheezing and stridor.   Cardiovascular: Negative for chest pain, palpitations and leg swelling.  Gastrointestinal: Negative for nausea, vomiting, abdominal pain, diarrhea, constipation, blood in stool and abdominal distention.  Genitourinary: Negative for urgency, hematuria, flank pain, discharge, difficulty urinating and genital sores.  Musculoskeletal: Negative for myalgias, back pain, joint swelling, arthralgias and gait problem.  Skin: Negative for rash.  Neurological: Negative for dizziness, syncope, speech difficulty, weakness, numbness and headaches.  Hematological: Negative for adenopathy. Does not bruise/bleed easily.  Psychiatric/Behavioral: Negative for behavioral problems and dysphoric mood. The patient is not nervous/anxious.        Objective:   Physical Exam  Constitutional: He is oriented to person, place, and time. He appears well-developed.       Weight 225 Blood pressure 118/70  HENT:  Head: Normocephalic.  Right Ear: External ear normal.  Left Ear: External ear normal.  Eyes: Conjunctivae normal and EOM are normal.  Neck: Normal range of motion.  Cardiovascular: Normal rate and normal heart sounds.   Pulmonary/Chest: Breath sounds normal.  Abdominal: Bowel sounds are normal.  Musculoskeletal: Normal range of motion. He exhibits no edema and no tenderness.  Neurological: He is alert and oriented to person, place, and time.  Psychiatric: He has a normal mood and affect. His behavior is normal.          Assessment & Plan:    Hypertension well controlled Coronary  artery disease Dyslipidemia. We'll continue Crestor. Samples provided  Recheck 6 months Weight loss encouraged Low-salt diet recommended

## 2011-11-11 NOTE — Patient Instructions (Signed)
Limit your sodium (Salt) intake  You need to lose weight.  Consider a lower calorie diet and regular exercise.  Return in 6 months for follow-up   

## 2011-12-22 ENCOUNTER — Other Ambulatory Visit: Payer: Self-pay | Admitting: Internal Medicine

## 2011-12-22 NOTE — Telephone Encounter (Signed)
Pt needs samples of crestor 10 mg  °

## 2011-12-22 NOTE — Telephone Encounter (Signed)
Pt aware samples ready for pick up 

## 2012-02-03 IMAGING — CT CT CTA ABD/PEL W/CM AND/OR W/O CM
1 of 7 series · 12 of 46 positions shown, 17 images · IV contrast (omnipaque)
Comparison: Ultrasound dated 03/19/2010

CLINICAL DATA: 86-year-old with abdominal aortic aneurysm.  Pre
stent evaluation.

CT ANGIOGRAPHY OF ABDOMEN AND PELVIS - PRESTENT PROTOCOL
TECHNIQUE: Multidetector CT imaging of the abdomen and pelvis was
performed during bolus injection of intravenous contrast.
Multiplanar CT angiographic image reconstructions including MIPs
were also generated to evaluate the vascular anatomy.
Contrast:  80 ml Omnipaque 350

[Series 5: angio · axial · 0.84mm/px · z∈[-395,+25]mm · 12 of 196 slices shown, 17 images]
[im 14/196  soft-tissue]
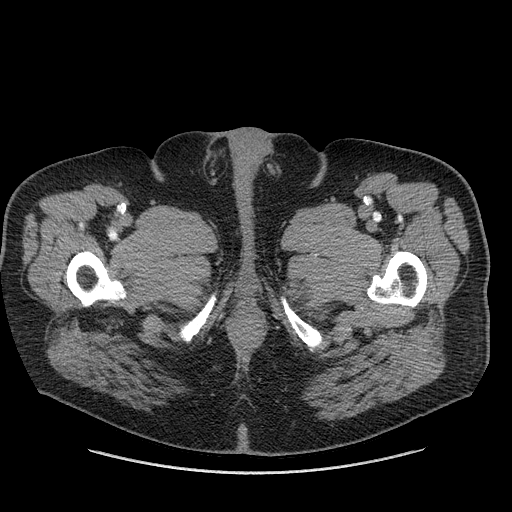
[im 14/196  bone]
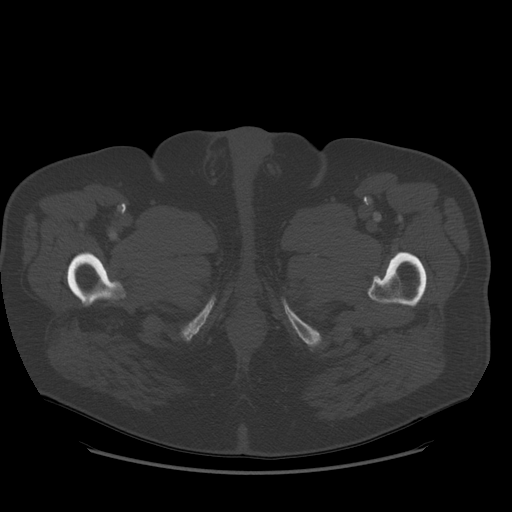
[im 28/196  soft-tissue]
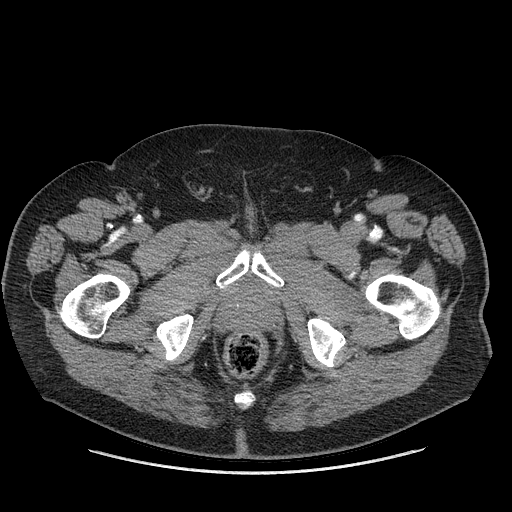
[im 42/196  soft-tissue]
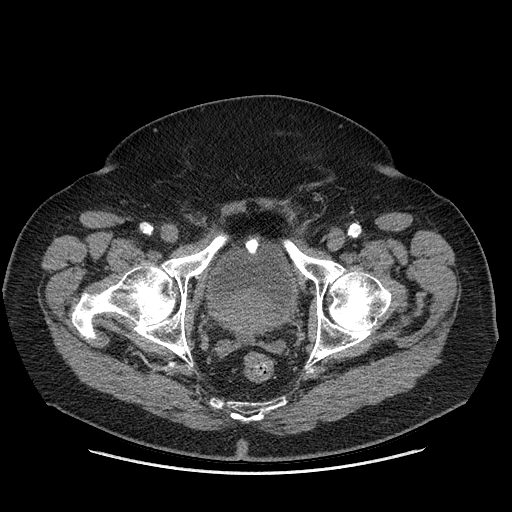
[im 70/196  soft-tissue]
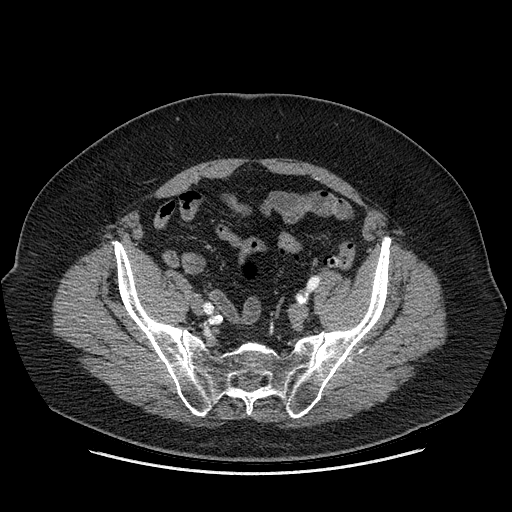
[im 84/196  soft-tissue]
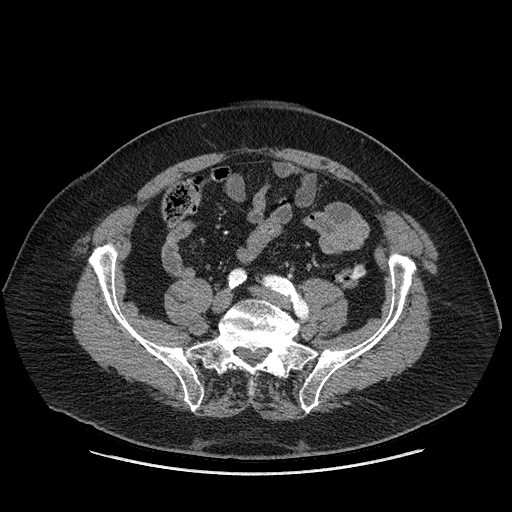
[im 98/196  soft-tissue]
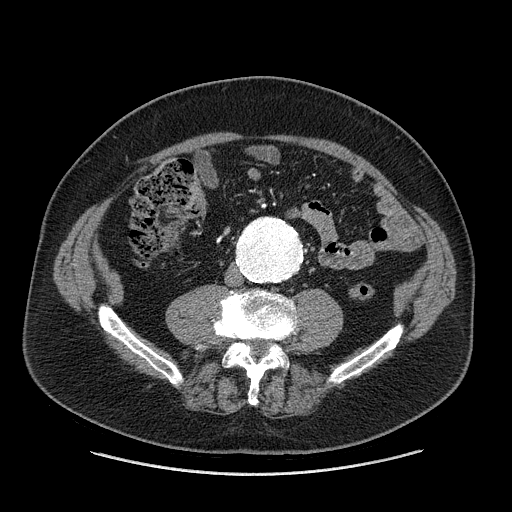
[im 112/196  soft-tissue]
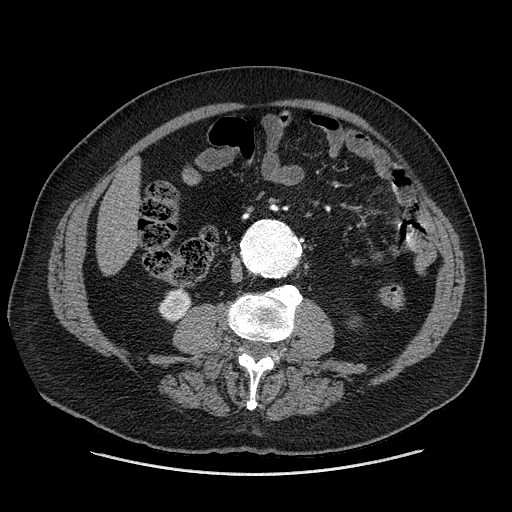
[im 126/196  soft-tissue]
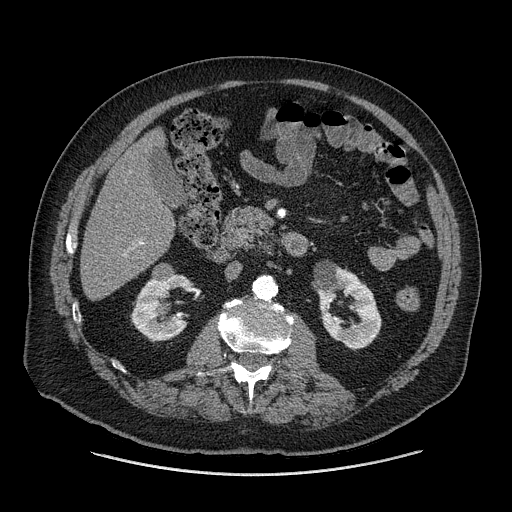
[im 140/196  lung]
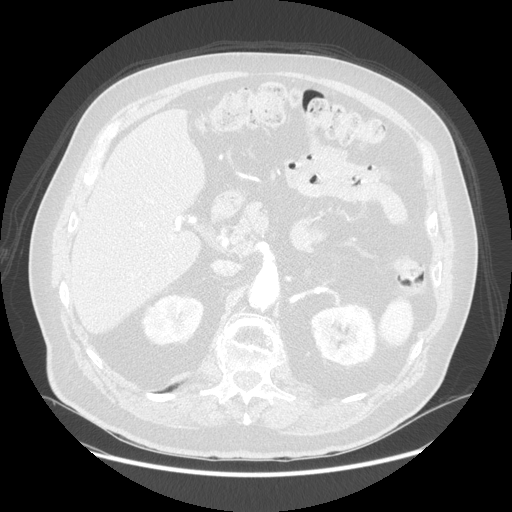
[im 154/196  soft-tissue]
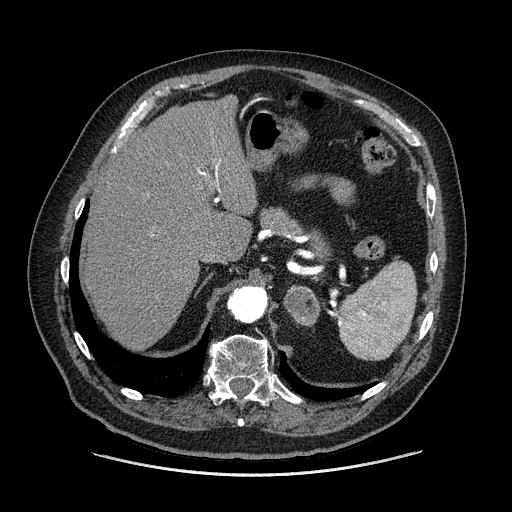
[im 154/196  lung]
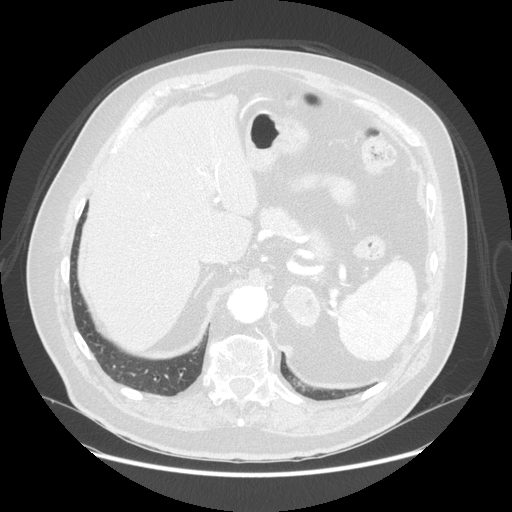
[im 154/196  bone]
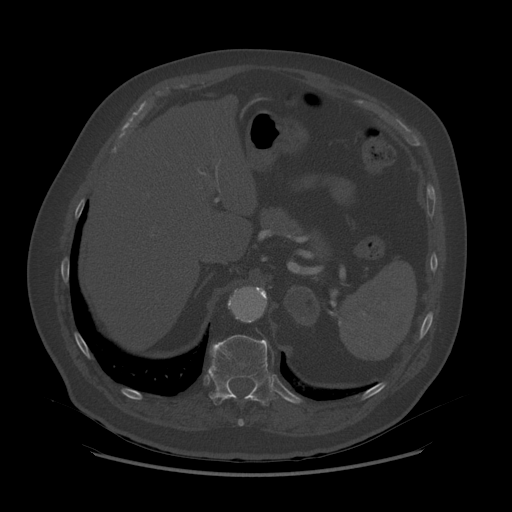
[im 168/196  soft-tissue]
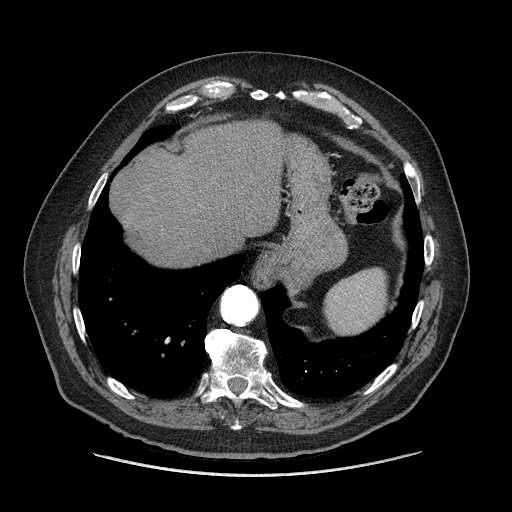
[im 168/196  lung]
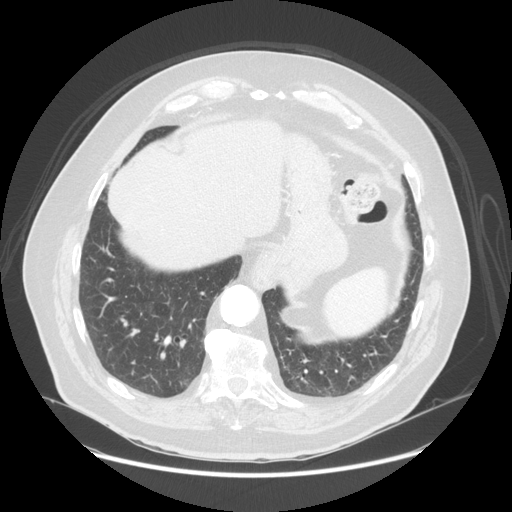
[im 182/196  soft-tissue]
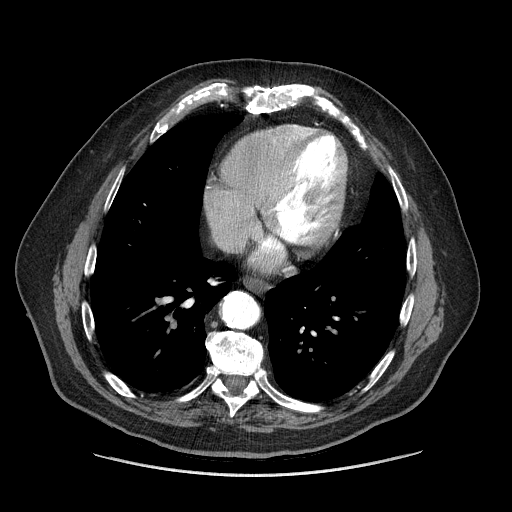
[im 182/196  lung]
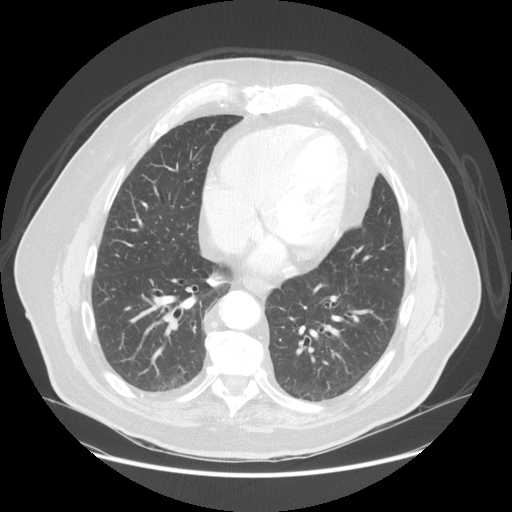

[12 of 46 positions shown; findings below may reference images not displayed]

FINDINGS: The lung bases are clear.  No evidence for free air.  The
abdominal aorta is patent.  The celiac trunk, SMA and inferior
mesenteric artery are patent.  There is a single right renal artery
that contains calcifications at the origin.  No significant ostial
stenosis of the right renal artery.  There is an accessory left
renal artery.  Main left renal artery is heavily calcified at the
origin and there may be mild-moderate stenosis.  The accessory left
renal artery originates just below the main left renal artery and
may be stenotic at its origin. The right renal artery appears to be
the lowest renal artery.  The iliac arteries are patent without
aneurysmal dilatation.  The distal right common femoral artery is
heavily calcified.  Infrarenal abdominal aortic aneurysm measures
up to 6.0 cm.

There is a heterogeneous left adrenal lesion.  The lesion measures
4.0 x 2.5 cm and there is a small focus of fat within this
structure suggesting a myelolipoma.  There are low density
structures involving both kidneys that are most compatible with
cysts.  There is a small lesion in the anterior right kidney lower
pole adjacent bowel that measures 1 cm.  This lesion is hyperdense
and indeterminate.  High density structures in the gallbladder are
consistent with small stones.  No evidence for gallbladder
dilatation or inflammation.  The no gross abnormality to the liver,
pancreas or spleen.  There is a small hiatal hernia present. Normal
appearance of the appendix.  Diverticula involving the left colon
and sigmoid colon.  No evidence for colonic inflammation.  No gross
abnormality to the prostate, seminal vesicles or urinary bladder.
There is a compression fracture involving the L1 vertebral body
that has a chronic appearance.

Length of infrarenal neck (from lowest renal artery): 34 mm
Number of renal arteries:  Right = 1; Left = 2
Diameter of infrarenal neck:  27 mm
Total length of aneurysm:  94 mm
Aneurysm ends at aortic bifurcation: Yes  If no, Distance from
aneurysm to bifurcation:
Greatest aneurysm diameter:  60 mmGreatest common iliac artery
diameters:  Right =14 mm; Left = 17 mm
Diameter of common iliac arteries just above iliac bifurcation:
Right = 13 mm; Left = 14 mm
Length of common iliac arteries:  Right = 64 mm; Left = 97 mm

 Review of the MIP images confirms the above findings.
IMPRESSION: Infrarenal abdominal aortic aneurysm measuring up to 6 cm.  The
aneurysm terminates at the aortic bifurcation.  No significant
aneurysmal dilatation of the iliac arteries.

Two left renal arteries and a single right renal artery.  The
lowest renal artery is the right renal artery.

4.0 cm left adrenal lesion.  There is macroscopic fat within this
lesion and probably represents a myelolipoma.

Bilateral renal cysts.  There is a 1.0 cm hyperdense exophytic
structure in the lower pole of the right kidney.  This structure is
indeterminate.  This area could be better further evaluated with a
pre and post postcontrast CT or MRI.

## 2012-02-17 ENCOUNTER — Telehealth: Payer: Self-pay | Admitting: Internal Medicine

## 2012-02-17 NOTE — Telephone Encounter (Signed)
Pt needs new rx for cpap supplies. Pt needs new mask etc.

## 2012-02-20 NOTE — Telephone Encounter (Signed)
Spoke to pt already got supplies he needs. Told pt okay.

## 2012-05-11 ENCOUNTER — Encounter: Payer: Self-pay | Admitting: Internal Medicine

## 2012-05-11 ENCOUNTER — Ambulatory Visit (INDEPENDENT_AMBULATORY_CARE_PROVIDER_SITE_OTHER): Payer: Medicare Other | Admitting: Internal Medicine

## 2012-05-11 VITALS — BP 110/60 | HR 68 | Temp 97.8°F | Resp 20 | Wt 224.0 lb

## 2012-05-11 DIAGNOSIS — I2510 Atherosclerotic heart disease of native coronary artery without angina pectoris: Secondary | ICD-10-CM

## 2012-05-11 DIAGNOSIS — I4891 Unspecified atrial fibrillation: Secondary | ICD-10-CM

## 2012-05-11 DIAGNOSIS — E785 Hyperlipidemia, unspecified: Secondary | ICD-10-CM

## 2012-05-11 DIAGNOSIS — I10 Essential (primary) hypertension: Secondary | ICD-10-CM

## 2012-05-11 MED ORDER — HYDROCHLOROTHIAZIDE 25 MG PO TABS: ORAL_TABLET | ORAL | Status: DC

## 2012-05-11 NOTE — Progress Notes (Signed)
Subjective:    Patient ID: Corey Mcdonald, male    DOB: 12/19/1923, 79 y.o.   MRN: [id number]  HPI  79 year old patient who is seen today for his six-month followup. He is doing quite well he has a history of paroxysmal atrial fibrillation. He has dyslipidemia and hypertension. He has coronary artery disease which has been stable. Denies any cardiopulmonary complaints. He has a history of exogenous obesity  Past Medical History  Diagnosis Date  . ABDOMINAL AORTIC ANEURYSM 09/15/2008  . Atrial fibrillation 10/10/2006  . Atrial flutter 08/18/2006  . BENIGN PROSTATIC HYPERTROPHY, HX OF 08/18/2006  . COLONIC POLYPS, HX OF 08/18/2006  . HYPERLIPIDEMIA 12/04/2009  . HYPERTENSION 08/18/2006  . HYPOTENSION 09/04/2008  . MACULAR DEGENERATION 08/18/2006  . PEPTIC ULCER DISEASE, HX OF 08/18/2006  . RESTLESS LEGS SYNDROME 12/04/2009  . SLEEP APNEA 08/18/2006  . VERTIGO 07/28/2008  . WEAKNESS 06/23/2008  . CORONARY ARTERY DISEASE     non-ST segment MI, March 15, 2010  . Anemia January 2012    admitted with acute MI March 15, 2010 and has significant acute blood loss anemia requiring transfusions of two units of packed RBCs.  Status post upper endoscopy March 22, 2010 without High-Risk bleeding lesion.  History of peptic ulcer disease  . Myocardial infarction 02/2010  . Leg pain   . AAA (abdominal aortic aneurysm)     History   Social History  . Marital Status: Married    Spouse Name: N/A    Number of Children: N/A  . Years of Education: N/A   Occupational History  . Not on file.   Social History Main Topics  . Smoking status: Former Smoker -- 40 years    Types: Cigarettes    Quit date: 02/22/1983  . Smokeless tobacco: Never Used  . Alcohol Use: No  . Drug Use: No  . Sexually Active: Not on file   Other Topics Concern  . Not on file   Social History Narrative  . No narrative on file    Past Surgical History  Procedure Laterality Date  . Inguinal hernia repair      Left inguinal   . Coronary artery bypass graft  1997    Family History  Problem Relation Age of Onset  . Heart disease Mother   . Heart disease Father   . Heart disease Sister   . Macular degeneration Sister   . Heart disease Sister     Allergies  Allergen Reactions  . Cilostazol Other (See Comments)    Adverse reaction per patient  . Doxazosin Other (See Comments)    INTOLERANCE     Current Outpatient Prescriptions on File Prior to Visit  Medication Sig Dispense Refill  . aspirin 81 MG tablet Take 81 mg by mouth daily.        Marland Kitchen diltiazem (CARDIZEM CD) 180 MG 24 hr capsule TAKE ONE CAPSULE BY MOUTH EVERY DAY  90 capsule  3  . docusate sodium (COLACE) 100 MG capsule Take 100 mg by mouth daily.       . metoprolol (LOPRESSOR) 25 MG tablet Take 25 mg by mouth 2 (two) times daily.       . Multiple Vitamin (MULTIVITAMIN) tablet Take 1 tablet by mouth daily.       . nitroGLYCERIN (NITROSTAT) 0.4 MG SL tablet Place 0.4 mg under the tongue every 5 (five) minutes as needed. FOR CHEST PAIN       . pantoprazole (PROTONIX) 40 MG tablet Take 40 mg by mouth  daily.       Marland Kitchen PRESCRIPTION MEDICATION Place 1 drop into both eyes 3 (three) times daily. "falcon eye drops" (samplemed  from dr Mercy Riding)      . rosuvastatin (CRESTOR) 10 MG tablet Take 10 mg by mouth every evening.        . [DISCONTINUED] carbidopa-levodopa (SINEMET CR) 25-100 MG per tablet Take by mouth. 1/2 tablet at bedtime        No current facility-administered medications on file prior to visit.    BP 110/60  Pulse 68  Temp(Src) 97.8 F (36.6 C) (Oral)  Resp 20  Wt 224 lb (101.606 kg)  BMI 31.26 kg/m2  SpO2 96%      Review of Systems  Constitutional: Negative for fever, chills, appetite change and fatigue.  HENT: Negative for hearing loss, ear pain, congestion, sore throat, trouble swallowing, neck stiffness, dental problem, voice change and tinnitus.   Eyes: Negative for pain, discharge and visual disturbance.  Respiratory:  Negative for cough, chest tightness, wheezing and stridor.   Cardiovascular: Negative for chest pain, palpitations and leg swelling.  Gastrointestinal: Negative for nausea, vomiting, abdominal pain, diarrhea, constipation, blood in stool and abdominal distention.  Genitourinary: Negative for urgency, hematuria, flank pain, discharge, difficulty urinating and genital sores.  Musculoskeletal: Negative for myalgias, back pain, joint swelling, arthralgias and gait problem.  Skin: Negative for rash.  Neurological: Negative for dizziness, syncope, speech difficulty, weakness, numbness and headaches.  Hematological: Negative for adenopathy. Does not bruise/bleed easily.  Psychiatric/Behavioral: Negative for behavioral problems and dysphoric mood. The patient is not nervous/anxious.        Objective:   Physical Exam  Constitutional: He is oriented to person, place, and time. He appears well-developed.  Blood pressure 100/60  HENT:  Head: Normocephalic.  Right Ear: External ear normal.  Left Ear: External ear normal.  Eyes: Conjunctivae and EOM are normal.  Neck: Normal range of motion.  Cardiovascular: Normal rate, regular rhythm and normal heart sounds.   Pulmonary/Chest: Breath sounds normal.  Abdominal: Bowel sounds are normal.  Musculoskeletal: Normal range of motion. He exhibits no edema and no tenderness.  Neurological: He is alert and oriented to person, place, and time.  Psychiatric: He has a normal mood and affect. His behavior is normal.          Assessment & Plan:    PAF HTN- will change hctz to prn edema CAD HLD  CPX 6 mon meds RF

## 2012-05-11 NOTE — Patient Instructions (Signed)
Limit your sodium (Salt) intake  Please check your blood pressure on a regular basis.  If it is consistently greater than 150/90, please make an office appointment.  Return in 6 months for follow-up   

## 2012-07-31 ENCOUNTER — Ambulatory Visit: Payer: Medicare Other | Admitting: Vascular Surgery

## 2012-08-01 ENCOUNTER — Other Ambulatory Visit: Payer: Self-pay | Admitting: *Deleted

## 2012-08-02 ENCOUNTER — Telehealth: Payer: Self-pay | Admitting: Internal Medicine

## 2012-08-02 NOTE — Telephone Encounter (Signed)
Message forwarded to N. Maisie Fus, CMA.

## 2012-08-02 NOTE — Telephone Encounter (Signed)
Returning [place] call-he does not need a refill on his HCTZ-- you wanted to know what the dosage was-it is 12.5 mg!

## 2012-08-02 NOTE — Telephone Encounter (Signed)
Call [facility] and let them know that patient does not need any refills on HCTZ

## 2012-08-03 ENCOUNTER — Other Ambulatory Visit: Payer: Self-pay | Admitting: Vascular Surgery

## 2012-08-06 ENCOUNTER — Encounter: Payer: Self-pay | Admitting: Vascular Surgery

## 2012-08-07 ENCOUNTER — Ambulatory Visit
Admission: RE | Admit: 2012-08-07 | Discharge: 2012-08-07 | Disposition: A | Discharge status: Home / Self Care | Payer: Medicare Other | Source: Ambulatory Visit | Attending: Vascular Surgery | Admitting: Vascular Surgery

## 2012-08-07 ENCOUNTER — Ambulatory Visit: Payer: Medicare Other | Admitting: Vascular Surgery

## 2012-08-07 ENCOUNTER — Encounter: Payer: Self-pay | Admitting: Vascular Surgery

## 2012-08-07 ENCOUNTER — Ambulatory Visit (INDEPENDENT_AMBULATORY_CARE_PROVIDER_SITE_OTHER): Payer: Medicare Other | Admitting: Vascular Surgery

## 2012-08-07 VITALS — BP 147/69 | HR 60 | Resp 18 | Ht 71.0 in | Wt 218.9 lb

## 2012-08-07 DIAGNOSIS — Z48812 Encounter for surgical aftercare following surgery on the circulatory system: Secondary | ICD-10-CM

## 2012-08-07 DIAGNOSIS — I714 Abdominal aortic aneurysm, without rupture: Secondary | ICD-10-CM

## 2012-08-07 MED ORDER — IOHEXOL 350 MG/ML SOLN
100.0000 mL | Freq: Once | INTRAVENOUS | Status: AC | PRN
  Administered 2012-08-07: 100 mL via INTRAVENOUS

## 2012-08-07 NOTE — Addendum Note (Signed)
Addended by: Adria Dill L on: 08/07/2012 04:55 PM   Modules accepted: Orders

## 2012-08-07 NOTE — Progress Notes (Signed)
[facility] [place] HISTORY AND PHYSICAL   CC:  F/u to AAA stent graft  Gordy Savers, MD  HPI: This is a 80 y.o. male  Who returns today after AAA stent graft repair in December of 2012.  He denies any problems with the exception of cramping in his legs after walking.  He states this gets better when he rests.  He has HTN for which he takes antihypertensives and takes a statin for his cholesterol.  Past Medical History  Diagnosis Date  . ABDOMINAL AORTIC ANEURYSM 09/15/2008  . Atrial fibrillation 10/10/2006  . Atrial flutter 08/18/2006  . BENIGN PROSTATIC HYPERTROPHY, HX OF 08/18/2006  . COLONIC POLYPS, HX OF 08/18/2006  . HYPERLIPIDEMIA 12/04/2009  . HYPERTENSION 08/18/2006  . HYPOTENSION 09/04/2008  . MACULAR DEGENERATION 08/18/2006  . PEPTIC ULCER DISEASE, HX OF 08/18/2006  . RESTLESS LEGS SYNDROME 12/04/2009  . SLEEP APNEA 08/18/2006  . VERTIGO 07/28/2008  . WEAKNESS 06/23/2008  . CORONARY ARTERY DISEASE     non-ST segment MI, March 15, 2010  . Anemia January 2012    admitted with acute MI March 15, 2010 and has significant acute blood loss anemia requiring transfusions of two units of packed RBCs.  Status post upper endoscopy March 22, 2010 without High-Risk bleeding lesion.  History of peptic ulcer disease  . Myocardial infarction 02/2010  . Leg pain   . AAA (abdominal aortic aneurysm)    Past Surgical History  Procedure Laterality Date  . Inguinal hernia repair      Left inguinal  . Coronary artery bypass graft  1997    Allergies  Allergen Reactions  . Cilostazol Other (See Comments)    Adverse reaction per patient  . Doxazosin Other (See Comments)    INTOLERANCE     Current Outpatient Prescriptions  Medication Sig Dispense Refill  . aspirin 81 MG tablet Take 81 mg by mouth daily.        Marland Kitchen diltiazem (CARDIZEM CD) 180 MG 24 hr capsule TAKE ONE CAPSULE BY MOUTH EVERY DAY  90 capsule  3  . docusate sodium (COLACE) 100 MG capsule Take 100 mg  by mouth daily.       . hydrochlorothiazide (HYDRODIURIL) 25 MG tablet Hydrochlorothiazide daily as needed for swelling      . metoprolol (LOPRESSOR) 25 MG tablet Take 25 mg by mouth 2 (two) times daily.       . Multiple Vitamin (MULTIVITAMIN) tablet Take 1 tablet by mouth daily.       . nitroGLYCERIN (NITROSTAT) 0.4 MG SL tablet Place 0.4 mg under the tongue every 5 (five) minutes as needed. FOR CHEST PAIN       . pantoprazole (PROTONIX) 40 MG tablet Take 40 mg by mouth daily.       . rosuvastatin (CRESTOR) 10 MG tablet Take 10 mg by mouth every evening.        Marland Kitchen PRESCRIPTION MEDICATION Place 1 drop into both eyes 3 (three) times daily. "falcon eye drops" (samplemed  from dr Mercy Riding)      . [DISCONTINUED] carbidopa-levodopa (SINEMET CR) 25-100 MG per tablet Take by mouth. 1/2 tablet at bedtime        No current facility-administered medications for this visit.    Family History  Problem Relation Age of Onset  . Heart disease Mother   . Heart disease Father   . Heart disease Sister   . Macular degeneration Sister   . Heart disease Sister     History  Social History  . Marital Status: Married    Spouse Name: N/A    Number of Children: N/A  . Years of Education: N/A   Occupational History  . Not on file.   Social History Main Topics  . Smoking status: Former Smoker -- 40 years    Types: Cigarettes    Quit date: 02/22/1983  . Smokeless tobacco: Never Used  . Alcohol Use: No  . Drug Use: No  . Sexually Active: Not on file   Other Topics Concern  . Not on file   Social History Narrative  . No narrative on file     ROS: [x]  Positive   [ ]  Negative   [ ]  All sytems reviewed and are negative  General: [ ]  Weight loss, [ ]  Fever, [ ]  chills Neurologic: [ ]  Dizziness, [ ]  Blackouts, [ ]  Seizure [ ]  Stroke, [ ]  "Mini stroke", [ ]  Slurred speech, [ ]  Temporary blindness; [ ]  weakness in arms or legs, [ ]  Hoarseness Cardiac: [ ]  Chest pain/pressure, [ ]  Shortness of breath  at rest [ ]  Shortness of breath with exertion, [ ]  Atrial fibrillation or irregular heartbeat Vascular: [x ] Pain in legs with walking, [ ]  Pain in legs at rest, [ ]  Pain in legs at night,  [ ]  Non-healing ulcer, [ ]  Blood clot in vein/DVT,   Pulmonary: [ ]  Home oxygen, [ ]  Productive cough, [ ]  Coughing up blood, [ ]  Asthma,  [ ]  Wheezing Musculoskeletal:  [ ]  Arthritis, [ ]  Low back pain, [ ]  Joint pain Hematologic: [ ]  Easy Bruising, [ ]  Anemia; [ ]  Hepatitis Gastrointestinal: [ ]  Blood in stool, [ ]  Gastroesophageal Reflux/heartburn, [ ]  Trouble swallowing Urinary: [ ]  chronic Kidney disease, [ ]  on HD - [ ]  MWF or [ ]  TTHS, [ ]  Burning with urination, [ ]  Difficulty urinating Endocrine: [ ]  hx of diabetes, [ ]  hx of thyroid disease Skin: [ ]  Rashes, [ ]  Wounds Psychological: [ ]  Anxiety, [ ]  Depression   PHYSICAL EXAMINATION:  Filed Vitals:   08/07/12 1333  BP: 147/69  Pulse: 60  Resp: 18   Body mass index is 30.54 kg/(m^2).  General:  WDWN in NAD Gait: Normal HENT: WNL Eyes: PERRL Pulmonary: normal non-labored breathing , without Rales, rhonchi,  wheezing Cardiac: RRR, without  Murmurs, rubs or gallops; No carotid bruits Abdomen: soft, NT, no pulsatile mass present Skin: no rashes, ulcers noted Vascular Exam/Pulses: +2 palpable femoral pulses; popliteal and pedal pulses are not palpable.  No carotid bruits are present. Extremities: without ischemic changes, no Gangrene , no cellulitis; no open wounds;  Musculoskeletal: no muscle wasting or atrophy  Neurologic: A&O X 3; Appropriate Affect ; SENSATION: normal; MOTOR FUNCTION:  moving all extremities equally. Speech is fluent/normal   Non-Invasive Vascular Imaging:  CTA 08/07/12 IMPRESSION:  1. Stable to slightly decreased size the excluded aneurysm sac  (maximal diameter 5.8 cm compared to 5.9 previously) status post  EVAR. No evidence of endoleak or other complicating feature.  2. Additional ancillary findings as above  without significant  interval change.  CBC    Component Value Date/Time   WBC 12.0* 05/10/2011 0635   RBC 4.37 05/10/2011 0635   HGB 12.7* 05/10/2011 0635   HCT 37.9* 05/10/2011 0635   PLT 240 05/10/2011 0635   MCV 86.7 05/10/2011 0635   MCH 29.1 05/10/2011 0635   MCHC 33.5 05/10/2011 0635   RDW 15.4 05/10/2011 0635   LYMPHSABS 1.8  05/10/2011 0006   MONOABS 1.4* 05/10/2011 0006   EOSABS 0.3 05/10/2011 0006   BASOSABS 0.0 05/10/2011 0006    BMET    Component Value Date/Time   NA 139 05/10/2011 0635   K 4.7 05/10/2011 0635   CL 103 05/10/2011 0635   CO2 24 05/10/2011 0635   GLUCOSE 126* 05/10/2011 0635   BUN 19 08/03/2012 1619   CREATININE 1.25 08/03/2012 1619   CREATININE 1.03 05/10/2011 0635   CALCIUM 9.5 05/10/2011 0635   GFRNONAA 64* 05/10/2011 0635   GFRAA 74* 05/10/2011 0635     ASSESSMENT/PLAN: 79 y.o. male s/p AAA stent graft repair December 2012  -pt continues to do well from surgery and CTA is stable -he does have intermittent claudication - the CTA shows calcification of his bilateral SFA's.  He does not have any non healing ulcers and is able to ambulate.   -will continue to monitor-if this worsens, the pt will contact us. -f/u in 1 year with CTA for stent graft protocol.   Doreatha Massed, PA-C [facility] [phone]  Clinic MD:  Pt seen and examined in conjunction with Dr. Arbie Cookey  I have examined the patient, reviewed and agree with above. Patient is having nonlimiting claudication type symptoms in his calf. Discuss this with the patient and his son present. I certainly recommend observation only at this time. CT scan reviewed with the patient's son showing stable stent graft repair EARLY, TODD, MD 08/07/2012 2:03 PM

## 2012-09-25 ENCOUNTER — Encounter: Payer: Self-pay | Admitting: *Deleted

## 2012-09-28 ENCOUNTER — Encounter: Payer: Self-pay | Admitting: Internal Medicine

## 2012-09-28 ENCOUNTER — Ambulatory Visit (INDEPENDENT_AMBULATORY_CARE_PROVIDER_SITE_OTHER): Payer: Medicare Other | Admitting: Internal Medicine

## 2012-09-28 VITALS — Ht 71.0 in | Wt 220.2 lb

## 2012-09-28 DIAGNOSIS — I714 Abdominal aortic aneurysm, without rupture, unspecified: Secondary | ICD-10-CM

## 2012-09-28 DIAGNOSIS — I4891 Unspecified atrial fibrillation: Secondary | ICD-10-CM

## 2012-09-28 DIAGNOSIS — I10 Essential (primary) hypertension: Secondary | ICD-10-CM

## 2012-09-28 DIAGNOSIS — I739 Peripheral vascular disease, unspecified: Secondary | ICD-10-CM

## 2012-09-28 DIAGNOSIS — E785 Hyperlipidemia, unspecified: Secondary | ICD-10-CM

## 2012-09-28 DIAGNOSIS — I2510 Atherosclerotic heart disease of native coronary artery without angina pectoris: Secondary | ICD-10-CM

## 2012-09-28 DIAGNOSIS — Z951 Presence of aortocoronary bypass graft: Secondary | ICD-10-CM

## 2012-09-28 MED ORDER — NITROGLYCERIN 0.4 MG SL SUBL: 0.4000 mg | SUBLINGUAL_TABLET | SUBLINGUAL | Status: DC | PRN

## 2012-09-28 NOTE — Patient Instructions (Addendum)
Your physician wants you to follow-up in: 1 year. You will receive a reminder letter in the mail two months in advance. If you don't receive a letter, please call our office to schedule the follow-up appointment.  Debrox ear drop.

## 2012-09-28 NOTE — Progress Notes (Signed)
OFFICE NOTE  Chief Complaint:  Routine office visit  Primary Care Physician: Rogelia Boga, MD  HPI:  Corey Mcdonald is an 79 year old gentleman with history of coronary bypass in 1997 and had a non-ST-elevation MI in 2010 which was due to the loss of vein graft to the right coronary. EF was 45%. The rest of his anatomy includes a LIMA to the LAD, a saphenous vein graft to an OM and a saphenous vein graft to a diagonal. The saphenous vein graft to the RCA was occluded in 2013. He also has hypertension which has been well controlled and dyslipidemia on Crestor. In addition, he had an abdominal aortic aneurysm and underwent stent grafting. He also has lower extremity claudication with stable ABIs in April 2013 with 0.77 on the left and 0.92 on the right, otherwise no other symptoms. He is being followed by Dr. early for this, who placed his stent graft. He's recently seen by him in June of this year and felt that his claudication is not lifestyle limiting and did not recommend any further treatment at this time. He does have a history of PAF but no recurrence recently but he is not on Coumadin due to high risk of falls and he has significant visual impairment.   PMHx:  Past Medical History  Diagnosis Date  . ABDOMINAL AORTIC ANEURYSM 09/15/2008  . Atrial fibrillation 10/10/2006  . Atrial flutter 08/18/2006  . BENIGN PROSTATIC HYPERTROPHY, HX OF 08/18/2006  . COLONIC POLYPS, HX OF 08/18/2006  . HYPERLIPIDEMIA 12/04/2009  . HYPERTENSION 08/18/2006  . HYPOTENSION 09/04/2008  . MACULAR DEGENERATION 08/18/2006  . PEPTIC ULCER DISEASE, HX OF 08/18/2006  . RESTLESS LEGS SYNDROME 12/04/2009  . SLEEP APNEA 08/18/2006  . VERTIGO 07/28/2008  . WEAKNESS 06/23/2008  . CORONARY ARTERY DISEASE     non-ST segment MI, March 15, 2010  . Anemia January 2012    admitted with acute MI March 15, 2010 and has significant acute blood loss anemia requiring transfusions of two units of packed RBCs.  Status post  upper endoscopy March 22, 2010 without High-Risk bleeding lesion.  History of peptic ulcer disease  . Myocardial infarction 02/2010  . Leg pain   . AAA (abdominal aortic aneurysm)   . NSTEMI (non-ST elevated myocardial infarction) 2010  . PAD (peripheral artery disease)   . PVD (peripheral vascular disease)   . History of Doppler ultrasound 05/2011    h/o claudication, stable ABIs  . History of nuclear stress test 2007    persantine; normal, low risk     Past Surgical History  Procedure Laterality Date  . Inguinal hernia repair      Left inguinal  . Coronary artery bypass graft  12/1995    x5; LIMA to ALD, vein graft to RCA (now occluded),SVG to OM, SVG to diagonal  . Abdominal aortic aneurysm repair      stenting   . Cardiac catheterization  02/2010    r/t NSTEMI; loss of SVG to RCA  . Transthoracic echocardiogram  04/2011    EF 55-60%; mild LVH; grade 1 diastolic dysfunction    FAMHx:  Family History  Problem Relation Age of Onset  . Heart disease Mother   . Heart disease Father   . Heart disease Sister   . Macular degeneration Sister   . Heart disease Sister   . Stroke Father   . Stroke Brother   . Hypertension Sister   . Heart Problems Sister     x2    SOCHx:  reports that he quit smoking about 25 years ago. His smoking use included Cigarettes. He has a 40 pack-year smoking history. He has never used smokeless tobacco. He reports that he does not drink alcohol or use illicit drugs.  ALLERGIES:  Allergies  Allergen Reactions  . Cilostazol Other (See Comments)    Adverse reaction per patient  . Doxazosin Other (See Comments)    INTOLERANCE     ROS: A comprehensive review of systems was negative except for: Constitutional: positive for fatigue Eyes: positive for visual loss Ears, nose, mouth, throat, and face: positive for hearing loss and ear wax Cardiovascular: positive for claudication  HOME MEDS: Current Outpatient Prescriptions  Medication Sig  Dispense Refill  . aspirin 81 MG tablet Take 81 mg by mouth daily.        Marland Kitchen diltiazem (CARDIZEM CD) 180 MG 24 hr capsule TAKE ONE CAPSULE BY MOUTH EVERY DAY  90 capsule  3  . docusate sodium (COLACE) 100 MG capsule Take 100 mg by mouth daily.       . hydrochlorothiazide (HYDRODIURIL) 25 MG tablet Hydrochlorothiazide daily as needed for swelling      . metoprolol (LOPRESSOR) 25 MG tablet Take 25 mg by mouth 2 (two) times daily.       . Multiple Vitamin (MULTIVITAMIN) tablet Take 1 tablet by mouth daily.       . nitroGLYCERIN (NITROSTAT) 0.4 MG SL tablet Place 1 tablet (0.4 mg total) under the tongue every 5 (five) minutes as needed. FOR CHEST PAIN  25 tablet  6  . pantoprazole (PROTONIX) 40 MG tablet Take 40 mg by mouth daily.       . rosuvastatin (CRESTOR) 10 MG tablet Take 10 mg by mouth every evening.        . [DISCONTINUED] carbidopa-levodopa (SINEMET CR) 25-100 MG per tablet Take by mouth. 1/2 tablet at bedtime        No current facility-administered medications for this visit.    LABS/IMAGING: No results found for this or any previous visit (from the past 48 hour(s)). No results found.  VITALS: Ht 5\' 11"  (1.803 m)  Wt 220 lb 3.2 oz (99.882 kg)  BMI 30.73 kg/m2  EXAM: General appearance: alert and no distress ENT:  bilateral cerumen which is not impacted, legal blindness Neck: no adenopathy, no carotid bruit, no JVD, supple, symmetrical, trachea midline and thyroid not enlarged, symmetric, no tenderness/mass/nodules Lungs: clear to auscultation bilaterally Heart: regular rate and rhythm, S1, S2 normal, no murmur, click, rub or gallop Abdomen: soft, non-tender; bowel sounds normal; no masses,  no organomegaly Extremities: extremities normal, atraumatic, no cyanosis or edema Pulses: 2+ and symmetric Skin: Skin color, texture, turgor normal. No rashes or lesions Neurologic: Grossly normal  EKG: Bradycardia 57, voltage criteria for LVH  ASSESSMENT: 1. Coronary artery disease  status post four-vessel CABG with an occluded graft to the right coronary in 1997 2. Ischemic cardiomyopathy EF 45% 3. Abdominal aortic aneurysm status post stent grafting 4. Bilateral claudication with reduced ABIs 5. Hypertension 6. Dyslipidemia 7. Remote history of atrial fibrillation not on warfarin do to legal blindness and high fall risk  PLAN: 1.    Corey Mcdonald is doing fairly well without significant symptoms. He did describe one episode where he felt hot at night woke up and did not describe any chest pain. He was diaphoretic. He called EMS who evaluated him and did not recommend any further treatment or going to the ER. His other complaint today is difficulty hearing when  he wakes up in the morning. I did look in his ears today and there is significant bilateral ear wax I recommended drops over-the-counter for this. Plan to see him back annually or sooner as needed.  Chrystie Nose, MD, [facility] Attending Cardiologist [facility]  Corey Mcdonald 09/28/2012, 11:00 AM

## 2012-10-01 ENCOUNTER — Telehealth: Payer: Self-pay | Admitting: Internal Medicine

## 2012-10-01 NOTE — Telephone Encounter (Signed)
PT wife called and stated that they will be seeing another doctor in their area for the pt's PT monitoring. She is inquiring to see if Dr. Kirtland Bouchard would still be able to provide Crestor 10mg  samples. Please assist.

## 2012-10-02 NOTE — Telephone Encounter (Signed)
Spoke to pt's wife and told her as long as he continues to come here for appointments we could give him samples. Mrs Gracy verbalized understanding.

## 2012-10-17 ENCOUNTER — Other Ambulatory Visit: Payer: Self-pay | Admitting: Internal Medicine

## 2012-10-18 ENCOUNTER — Other Ambulatory Visit: Payer: Self-pay | Admitting: *Deleted

## 2012-10-18 MED ORDER — HYDROCHLOROTHIAZIDE 25 MG PO TABS: ORAL_TABLET | ORAL | Status: DC

## 2012-11-16 ENCOUNTER — Ambulatory Visit (INDEPENDENT_AMBULATORY_CARE_PROVIDER_SITE_OTHER): Payer: Medicare Other

## 2012-11-16 ENCOUNTER — Telehealth: Payer: Self-pay | Admitting: Internal Medicine

## 2012-11-16 DIAGNOSIS — Z23 Encounter for immunization: Secondary | ICD-10-CM

## 2012-11-16 NOTE — Telephone Encounter (Signed)
Pt needs samples of crestor 10 mg. Pt has appt this morning at 11am for flu shot

## 2012-11-16 NOTE — Telephone Encounter (Signed)
Left detailed message samples are ready I gave them to Rachael in the Flu clinic to give to you.

## 2012-12-21 ENCOUNTER — Encounter: Payer: Self-pay | Admitting: Internal Medicine

## 2012-12-21 ENCOUNTER — Ambulatory Visit (INDEPENDENT_AMBULATORY_CARE_PROVIDER_SITE_OTHER): Payer: Medicare Other | Admitting: Internal Medicine

## 2012-12-21 VITALS — BP 120/60 | HR 64 | Temp 97.5°F | Wt 220.0 lb

## 2012-12-21 DIAGNOSIS — I2510 Atherosclerotic heart disease of native coronary artery without angina pectoris: Secondary | ICD-10-CM

## 2012-12-21 DIAGNOSIS — Z951 Presence of aortocoronary bypass graft: Secondary | ICD-10-CM

## 2012-12-21 DIAGNOSIS — I739 Peripheral vascular disease, unspecified: Secondary | ICD-10-CM

## 2012-12-21 DIAGNOSIS — I4891 Unspecified atrial fibrillation: Secondary | ICD-10-CM

## 2012-12-21 DIAGNOSIS — R5381 Other malaise: Secondary | ICD-10-CM

## 2012-12-21 NOTE — Patient Instructions (Signed)
Limit your sodium (Salt) intake  Return in 6 months for follow-up  

## 2012-12-21 NOTE — Progress Notes (Signed)
Subjective:    Patient ID: Corey Mcdonald, male    DOB: [birth year], 79 y.o.   MRN: [id number]  HPI  79 year old patient who is seen today for his biannual followup. He has a history of paroxysmal atrial fibrillation and no longer is on anticoagulation due to to a high fall risk. He has hypertension which has been controlled with diltiazem and diuretic therapy. He has coronary artery disease status post CABG which has been stable. No chest pain shortness of breath. He generally feels quite well. He is seen periodically by cardiology Denies any shortness of breath or tachycardia arrhythmias. He remains on Crestor for dyslipidemia which he continues to tolerate well. He has a history of RLS and OSA which has been stable  Past Medical History  Diagnosis Date  . ABDOMINAL AORTIC ANEURYSM 09/15/2008  . Atrial fibrillation 10/10/2006  . Atrial flutter 08/18/2006  . BENIGN PROSTATIC HYPERTROPHY, HX OF 08/18/2006  . COLONIC POLYPS, HX OF 08/18/2006  . HYPERLIPIDEMIA 12/04/2009  . HYPERTENSION 08/18/2006  . HYPOTENSION 09/04/2008  . MACULAR DEGENERATION 08/18/2006  . PEPTIC ULCER DISEASE, HX OF 08/18/2006  . RESTLESS LEGS SYNDROME 12/04/2009  . SLEEP APNEA 08/18/2006  . VERTIGO 07/28/2008  . WEAKNESS 06/23/2008  . CORONARY ARTERY DISEASE     non-ST segment MI, March 15, 2010  . Anemia January 2012    admitted with acute MI March 15, 2010 and has significant acute blood loss anemia requiring transfusions of two units of packed RBCs.  Status post upper endoscopy March 22, 2010 without High-Risk bleeding lesion.  History of peptic ulcer disease  . Myocardial infarction 02/2010  . Leg pain   . AAA (abdominal aortic aneurysm)   . NSTEMI (non-ST elevated myocardial infarction) 2010  . PAD (peripheral artery disease)   . PVD (peripheral vascular disease)   . History of Doppler ultrasound 05/2011    h/o claudication, stable ABIs  . History of nuclear stress test 2007    persantine; normal, low risk      History   Social History  . Marital Status: Married    Spouse Name: N/A    Number of Children: 2  . Years of Education: N/A   Occupational History  . Not on file.   Social History Main Topics  . Smoking status: Former Smoker -- 1.00 packs/day for 40 years    Types: Cigarettes    Quit date: 02/22/1987  . Smokeless tobacco: Never Used  . Alcohol Use: No  . Drug Use: No  . Sexual Activity: Not on file   Other Topics Concern  . Not on file   Social History Narrative  . No narrative on file    Past Surgical History  Procedure Laterality Date  . Inguinal hernia repair      Left inguinal  . Coronary artery bypass graft  12/1995    x5; LIMA to ALD, vein graft to RCA (now occluded),SVG to OM, SVG to diagonal  . Abdominal aortic aneurysm repair      stenting   . Cardiac catheterization  02/2010    r/t NSTEMI; loss of SVG to RCA  . Transthoracic echocardiogram  04/2011    EF 55-60%; mild LVH; grade 1 diastolic dysfunction    Family History  Problem Relation Age of Onset  . Heart disease Mother   . Heart disease Father   . Heart disease Sister   . Macular degeneration Sister   . Heart disease Sister   . Stroke Father   . Stroke Brother   .  Hypertension Sister   . Heart Problems Sister     x2    Allergies  Allergen Reactions  . Cilostazol Other (See Comments)    Adverse reaction per patient  . Doxazosin Other (See Comments)    INTOLERANCE     Current Outpatient Prescriptions on File Prior to Visit  Medication Sig Dispense Refill  . aspirin 81 MG tablet Take 81 mg by mouth daily.        Marland Kitchen diltiazem (CARDIZEM CD) 180 MG 24 hr capsule TAKE ONE CAPSULE BY MOUTH EVERY DAY  90 capsule  1  . docusate sodium (COLACE) 100 MG capsule Take 100 mg by mouth daily.       . hydrochlorothiazide (HYDRODIURIL) 25 MG tablet Hydrochlorothiazide daily as needed for swelling  30 tablet  6  . metoprolol (LOPRESSOR) 25 MG tablet Take 25 mg by mouth 2 (two) times daily.       .  Multiple Vitamin (MULTIVITAMIN) tablet Take 1 tablet by mouth daily.       . nitroGLYCERIN (NITROSTAT) 0.4 MG SL tablet Place 1 tablet (0.4 mg total) under the tongue every 5 (five) minutes as needed. FOR CHEST PAIN  25 tablet  6  . pantoprazole (PROTONIX) 40 MG tablet Take 40 mg by mouth daily.       . rosuvastatin (CRESTOR) 10 MG tablet Take 10 mg by mouth every evening.        . [DISCONTINUED] carbidopa-levodopa (SINEMET CR) 25-100 MG per tablet Take by mouth. 1/2 tablet at bedtime        No current facility-administered medications on file prior to visit.    BP 120/60  Pulse 64  Temp(Src) 97.5 F (36.4 C) (Oral)  Wt 220 lb (99.791 kg)  BMI 30.7 kg/m2  SpO2 92%      Review of Systems  Constitutional: Positive for fatigue. Negative for fever, chills and appetite change.  HENT: Negative for congestion, dental problem, ear pain, hearing loss, sore throat, tinnitus, trouble swallowing and voice change.   Eyes: Negative for pain, discharge and visual disturbance.  Respiratory: Negative for cough, chest tightness, wheezing and stridor.   Cardiovascular: Negative for chest pain, palpitations and leg swelling.  Gastrointestinal: Negative for nausea, vomiting, abdominal pain, diarrhea, constipation, blood in stool and abdominal distention.  Genitourinary: Negative for urgency, hematuria, flank pain, discharge, difficulty urinating and genital sores.  Musculoskeletal: Positive for arthralgias, back pain and gait problem. Negative for joint swelling, myalgias and neck stiffness.  Skin: Negative for rash.  Neurological: Negative for dizziness, syncope, speech difficulty, weakness, numbness and headaches.  Hematological: Negative for adenopathy. Does not bruise/bleed easily.  Psychiatric/Behavioral: Negative for behavioral problems and dysphoric mood. The patient is not nervous/anxious.        Objective:   Physical Exam  Constitutional: He is oriented to person, place, and time. He  appears well-developed.  Blood pressure 120/60  HENT:  Head: Normocephalic.  Right Ear: External ear normal.  Left Ear: External ear normal.  Eyes: Conjunctivae and EOM are normal.  Neck: Normal range of motion.  Cardiovascular: Normal rate and regular rhythm.   Murmur heard. Pulse 64  Grade 3/6 harsh systolic murmur loudest at the primary aortic area  Pulmonary/Chest: Breath sounds normal.  Abdominal: Bowel sounds are normal.  Musculoskeletal: Normal range of motion. He exhibits no edema and no tenderness.  Neurological: He is alert and oriented to person, place, and time.  Psychiatric: He has a normal mood and affect. His behavior is normal.  Assessment & Plan:   PAF;  clinically stable.  Patient remains off anticoagulation Hypertension well controlled Coronary artery disease status post CABG  No change in therapy Medications updated CPX 6 months

## 2013-01-04 ENCOUNTER — Encounter: Payer: Self-pay | Admitting: Internal Medicine

## 2013-01-04 ENCOUNTER — Ambulatory Visit (INDEPENDENT_AMBULATORY_CARE_PROVIDER_SITE_OTHER): Payer: Medicare Other | Admitting: Internal Medicine

## 2013-01-04 ENCOUNTER — Ambulatory Visit: Payer: Medicare Other | Admitting: Internal Medicine

## 2013-01-04 VITALS — BP 120/68 | HR 71 | Temp 97.8°F | Wt 220.0 lb

## 2013-01-04 DIAGNOSIS — E860 Dehydration: Secondary | ICD-10-CM | POA: Insufficient documentation

## 2013-01-04 DIAGNOSIS — I4891 Unspecified atrial fibrillation: Secondary | ICD-10-CM

## 2013-01-04 DIAGNOSIS — K625 Hemorrhage of anus and rectum: Secondary | ICD-10-CM

## 2013-01-04 DIAGNOSIS — I714 Abdominal aortic aneurysm, without rupture: Secondary | ICD-10-CM

## 2013-01-04 DIAGNOSIS — Z951 Presence of aortocoronary bypass graft: Secondary | ICD-10-CM

## 2013-01-04 DIAGNOSIS — R195 Other fecal abnormalities: Secondary | ICD-10-CM

## 2013-01-04 LAB — CBC WITH DIFFERENTIAL/PLATELET
Eosinophils Relative: 2 % (ref 0–5)
HCT: 36.6 % — ABNORMAL LOW (ref 39.0–52.0)
Lymphocytes Relative: 16 % (ref 12–46)
Lymphs Abs: 1.7 10*3/uL (ref 0.7–4.0)
MCV: 84.3 fL (ref 78.0–100.0)
Monocytes Absolute: 0.9 10*3/uL (ref 0.1–1.0)
RBC: 4.34 MIL/uL (ref 4.22–5.81)
WBC: 10.1 10*3/uL (ref 4.0–10.5)

## 2013-01-04 MED ORDER — HYDROCORTISONE ACETATE 25 MG RE SUPP: 25.0000 mg | Freq: Two times a day (BID) | RECTAL | Status: DC

## 2013-01-04 NOTE — Progress Notes (Signed)
Subjective:    Patient ID: Corey Mcdonald, male    DOB: August 19, 1923, 79 y.o.   MRN: [id number]  HPI  79 year old patient who has a history of coronary artery disease as well as permanent atrial fibrillation.  He has a history of peptic ulcer disease prior GI bleeding requiring blood transfusions as well as a history of a AAA. He has not been felt to be a candidate for anticoagulation and has been on 81 mg of aspirin daily. 3 days ago he had the onset of bright red rectal bleeding associated with a bowel movement. This occurred yesterday again associated with a bowel movement. He describes  fresh blood on the tissue paper. He has had no bleeding today  Past Medical History  Diagnosis Date  . ABDOMINAL AORTIC ANEURYSM 09/15/2008  . Atrial fibrillation 10/10/2006  . Atrial flutter 08/18/2006  . BENIGN PROSTATIC HYPERTROPHY, HX OF 08/18/2006  . COLONIC POLYPS, HX OF 08/18/2006  . HYPERLIPIDEMIA 12/04/2009  . HYPERTENSION 08/18/2006  . HYPOTENSION 09/04/2008  . MACULAR DEGENERATION 08/18/2006  . PEPTIC ULCER DISEASE, HX OF 08/18/2006  . RESTLESS LEGS SYNDROME 12/04/2009  . SLEEP APNEA 08/18/2006  . VERTIGO 07/28/2008  . WEAKNESS 06/23/2008  . CORONARY ARTERY DISEASE     non-ST segment MI, March 15, 2010  . Anemia January 2012    admitted with acute MI March 15, 2010 and has significant acute blood loss anemia requiring transfusions of two units of packed RBCs.  Status post upper endoscopy March 22, 2010 without High-Risk bleeding lesion.  History of peptic ulcer disease  . Myocardial infarction 02/2010  . Leg pain   . AAA (abdominal aortic aneurysm)   . NSTEMI (non-ST elevated myocardial infarction) 2010  . PAD (peripheral artery disease)   . PVD (peripheral vascular disease)   . History of Doppler ultrasound 05/2011    h/o claudication, stable ABIs  . History of nuclear stress test 2007    persantine; normal, low risk     History   Social History  . Marital Status: Married    Spouse Name:  N/A    Number of Children: 2  . Years of Education: N/A   Occupational History  . Not on file.   Social History Main Topics  . Smoking status: Former Smoker -- 1.00 packs/day for 40 years    Types: Cigarettes    Quit date: 02/22/1987  . Smokeless tobacco: Never Used  . Alcohol Use: No  . Drug Use: No  . Sexual Activity: Not on file   Other Topics Concern  . Not on file   Social History Narrative  . No narrative on file    Past Surgical History  Procedure Laterality Date  . Inguinal hernia repair      Left inguinal  . Coronary artery bypass graft  12/1995    x5; LIMA to ALD, vein graft to RCA (now occluded),SVG to OM, SVG to diagonal  . Abdominal aortic aneurysm repair      stenting   . Cardiac catheterization  02/2010    r/t NSTEMI; loss of SVG to RCA  . Transthoracic echocardiogram  04/2011    EF 55-60%; mild LVH; grade 1 diastolic dysfunction    Family History  Problem Relation Age of Onset  . Heart disease Mother   . Heart disease Father   . Heart disease Sister   . Macular degeneration Sister   . Heart disease Sister   . Stroke Father   . Stroke Brother   . Hypertension  Sister   . Heart Problems Sister     x2    Allergies  Allergen Reactions  . Cilostazol Other (See Comments)    Adverse reaction per patient  . Doxazosin Other (See Comments)    INTOLERANCE     Current Outpatient Prescriptions on File Prior to Visit  Medication Sig Dispense Refill  . aspirin 81 MG tablet Take 81 mg by mouth daily.        Marland Kitchen diltiazem (CARDIZEM CD) 180 MG 24 hr capsule TAKE ONE CAPSULE BY MOUTH EVERY DAY  90 capsule  1  . docusate sodium (COLACE) 100 MG capsule Take 100 mg by mouth daily.       . metoprolol (LOPRESSOR) 25 MG tablet Take 25 mg by mouth 2 (two) times daily.       . Multiple Vitamin (MULTIVITAMIN) tablet Take 1 tablet by mouth daily.       . nitroGLYCERIN (NITROSTAT) 0.4 MG SL tablet Place 1 tablet (0.4 mg total) under the tongue every 5 (five) minutes as  needed. FOR CHEST PAIN  25 tablet  6  . pantoprazole (PROTONIX) 40 MG tablet Take 40 mg by mouth daily.       . rosuvastatin (CRESTOR) 10 MG tablet Take 10 mg by mouth every evening.        . [DISCONTINUED] carbidopa-levodopa (SINEMET CR) 25-100 MG per tablet Take by mouth. 1/2 tablet at bedtime        No current facility-administered medications on file prior to visit.    BP 120/68  Pulse 71  Temp(Src) 97.8 F (36.6 C) (Oral)  Wt 220 lb (99.791 kg)  SpO2 97%      Review of Systems  Constitutional: Negative for fever, chills, appetite change and fatigue.  HENT: Negative for congestion, dental problem, ear pain, hearing loss, sore throat, tinnitus, trouble swallowing and voice change.   Eyes: Negative for pain, discharge and visual disturbance.  Respiratory: Negative for cough, chest tightness, wheezing and stridor.   Cardiovascular: Negative for chest pain, palpitations and leg swelling.  Gastrointestinal: Positive for blood in stool and anal bleeding. Negative for nausea, vomiting, abdominal pain, diarrhea, constipation, abdominal distention and rectal pain.  Genitourinary: Negative for urgency, hematuria, flank pain, discharge, difficulty urinating and genital sores.  Musculoskeletal: Negative for arthralgias, back pain, gait problem, joint swelling, myalgias and neck stiffness.  Skin: Negative for rash.  Neurological: Negative for dizziness, syncope, speech difficulty, weakness, numbness and headaches.  Hematological: Negative for adenopathy. Does not bruise/bleed easily.  Psychiatric/Behavioral: Negative for behavioral problems and dysphoric mood. The patient is not nervous/anxious.        Objective:   Physical Exam  Constitutional: He appears well-developed and well-nourished. No distress.  Abdominal: Soft. Bowel sounds are normal. He exhibits no distension and no mass. There is no tenderness. There is no rebound and no guarding.  Genitourinary:  The rectal mass Stool  normal-appearing and hematest negative          Assessment & Plan:  Episodic bright red rectal bleeding. We'll check a CBC and observe. We'll treat with Anusol-HC suppositories through the weekend. He will  call if he develops further brisk lower GI bleeding

## 2013-01-04 NOTE — Patient Instructions (Signed)
Use a daily stool softener  Call or return to clinic prn if these symptoms worsen or fail to improve as anticipated.

## 2013-02-01 ENCOUNTER — Telehealth: Payer: Self-pay | Admitting: Internal Medicine

## 2013-02-01 NOTE — Telephone Encounter (Signed)
Pt need samples on crestor 10 mg. Pt wife will pick up

## 2013-02-01 NOTE — Telephone Encounter (Signed)
Pt's daughter notified samples of Crestor are at front desk for pickup.

## 2013-02-13 ENCOUNTER — Ambulatory Visit (INDEPENDENT_AMBULATORY_CARE_PROVIDER_SITE_OTHER): Payer: Medicare Other | Admitting: Family

## 2013-02-13 ENCOUNTER — Encounter: Payer: Self-pay | Admitting: Family

## 2013-02-13 VITALS — BP 110/60 | HR 67 | Wt 216.0 lb

## 2013-02-13 DIAGNOSIS — H6120 Impacted cerumen, unspecified ear: Secondary | ICD-10-CM

## 2013-02-13 DIAGNOSIS — H6122 Impacted cerumen, left ear: Secondary | ICD-10-CM

## 2013-02-13 NOTE — Progress Notes (Signed)
Subjective:    Patient ID: Corey Mcdonald, male    DOB: 11-27-1923, 79 y.o.   MRN: [id number]  HPI 79 year old white male, nonsmoker is in today with complaints of decreased hearing to his left ear x2 days. Reports he stuck his finger in his left ear attempted to scratch it and felt a pop. Thereafter, he was unable to hear. He has sneezing first thing in the morning that subsides. Denies any cough or congestion.   Review of Systems  Constitutional: Negative.   HENT: Positive for ear pain and sneezing.        Left ear decreased hearing.   Respiratory: Negative.   Cardiovascular: Negative.   Musculoskeletal: Negative.   Skin: Negative.   Allergic/Immunologic: Negative.   Psychiatric/Behavioral: Negative.    Past Medical History  Diagnosis Date  . ABDOMINAL AORTIC ANEURYSM 09/15/2008  . Atrial fibrillation 10/10/2006  . Atrial flutter 08/18/2006  . BENIGN PROSTATIC HYPERTROPHY, HX OF 08/18/2006  . COLONIC POLYPS, HX OF 08/18/2006  . HYPERLIPIDEMIA 12/04/2009  . HYPERTENSION 08/18/2006  . HYPOTENSION 09/04/2008  . MACULAR DEGENERATION 08/18/2006  . PEPTIC ULCER DISEASE, HX OF 08/18/2006  . RESTLESS LEGS SYNDROME 12/04/2009  . SLEEP APNEA 08/18/2006  . VERTIGO 07/28/2008  . WEAKNESS 06/23/2008  . CORONARY ARTERY DISEASE     non-ST segment MI, March 15, 2010  . Anemia January 2012    admitted with acute MI March 15, 2010 and has significant acute blood loss anemia requiring transfusions of two units of packed RBCs.  Status post upper endoscopy March 22, 2010 without High-Risk bleeding lesion.  History of peptic ulcer disease  . Myocardial infarction 02/2010  . Leg pain   . AAA (abdominal aortic aneurysm)   . NSTEMI (non-ST elevated myocardial infarction) 2010  . PAD (peripheral artery disease)   . PVD (peripheral vascular disease)   . History of Doppler ultrasound 05/2011    h/o claudication, stable ABIs  . History of nuclear stress test 2007    persantine; normal, low risk      History   Social History  . Marital Status: Married    Spouse Name: N/A    Number of Children: 2  . Years of Education: N/A   Occupational History  . Not on file.   Social History Main Topics  . Smoking status: Former Smoker -- 1.00 packs/day for 40 years    Types: Cigarettes    Quit date: 02/22/1987  . Smokeless tobacco: Never Used  . Alcohol Use: No  . Drug Use: No  . Sexual Activity: Not on file   Other Topics Concern  . Not on file   Social History Narrative  . No narrative on file    Past Surgical History  Procedure Laterality Date  . Inguinal hernia repair      Left inguinal  . Coronary artery bypass graft  12/1995    x5; LIMA to ALD, vein graft to RCA (now occluded),SVG to OM, SVG to diagonal  . Abdominal aortic aneurysm repair      stenting   . Cardiac catheterization  02/2010    r/t NSTEMI; loss of SVG to RCA  . Transthoracic echocardiogram  04/2011    EF 55-60%; mild LVH; grade 1 diastolic dysfunction    Family History  Problem Relation Age of Onset  . Heart disease Mother   . Heart disease Father   . Heart disease Sister   . Macular degeneration Sister   . Heart disease Sister   . Stroke  Father   . Stroke Brother   . Hypertension Sister   . Heart Problems Sister     x2    Allergies  Allergen Reactions  . Cilostazol Other (See Comments)    Adverse reaction per patient  . Doxazosin Other (See Comments)    INTOLERANCE     Current Outpatient Prescriptions on File Prior to Visit  Medication Sig Dispense Refill  . aspirin 81 MG tablet Take 81 mg by mouth daily.        Marland Kitchen diltiazem (CARDIZEM CD) 180 MG 24 hr capsule TAKE ONE CAPSULE BY MOUTH EVERY DAY  90 capsule  1  . docusate sodium (COLACE) 100 MG capsule Take 100 mg by mouth daily.       . hydrochlorothiazide (MICROZIDE) 12.5 MG capsule Take 12.5 mg by mouth daily.      . hydrocortisone (ANUSOL-HC) 25 MG suppository Place 1 suppository (25 mg total) rectally 2 (two) times daily.  12  suppository  3  . metoprolol (LOPRESSOR) 25 MG tablet Take 25 mg by mouth 2 (two) times daily.       . Multiple Vitamin (MULTIVITAMIN) tablet Take 1 tablet by mouth daily.       . nitroGLYCERIN (NITROSTAT) 0.4 MG SL tablet Place 1 tablet (0.4 mg total) under the tongue every 5 (five) minutes as needed. FOR CHEST PAIN  25 tablet  6  . pantoprazole (PROTONIX) 40 MG tablet Take 40 mg by mouth daily.       . rosuvastatin (CRESTOR) 10 MG tablet Take 10 mg by mouth every evening.        . [DISCONTINUED] carbidopa-levodopa (SINEMET CR) 25-100 MG per tablet Take by mouth. 1/2 tablet at bedtime        No current facility-administered medications on file prior to visit.    BP 110/60  Pulse 67  Wt 216 lb (97.977 kg)chart    Objective:   Physical Exam  Constitutional: He is oriented to person, place, and time. He appears well-developed and well-nourished.  HENT:  Nose: Nose normal.  Mouth/Throat: Oropharynx is clear and moist.  Cerumen impaction to the left ear  Neck: Normal range of motion. Neck supple.  Cardiovascular: Normal rate, regular rhythm and normal heart sounds.   Pulmonary/Chest: Effort normal and breath sounds normal.  Abdominal: Soft. Bowel sounds are normal.  Musculoskeletal: Normal range of motion.  Neurological: He is alert and oriented to person, place, and time.  Skin: Skin is warm and dry.  Psychiatric: He has a normal mood and affect.      Informed consent was obtained and peroxide gel was inserted into the ears bilaterally using the lavage kit the ears were lavaged until clean.Inspection with a cerumen spoon removed residual wax. Patient tolerated the procedure well.     Assessment & Plan:  Assessment: 1. Decreased hearing 2. Cerumen impaction  Plan: Recheck as scheduled and if needed.

## 2013-02-13 NOTE — Patient Instructions (Signed)
Cerumen Impaction A cerumen impaction is when the wax in your ear forms a plug. This plug usually causes reduced hearing. Sometimes it also causes an earache or dizziness. Removing a cerumen impaction can be difficult and painful. The wax sticks to the ear canal. The canal is sensitive and bleeds easily. If you try to remove a heavy wax buildup with a cotton tipped swab, you may push it in further. Irrigation with water, suction, and small ear curettes may be used to clear out the wax. If the impaction is fixed to the skin in the ear canal, ear drops may be needed for a few days to loosen the wax. People who build up a lot of wax frequently can use ear wax removal products available in your local drugstore. SEEK MEDICAL CARE IF:  You develop an earache, increased hearing loss, or marked dizziness. Document Released: 03/17/2004 Document Revised: 05/02/2011 Document Reviewed: 05/07/2009 [employer] Patient Information 2014 [employer].  

## 2013-02-27 ENCOUNTER — Other Ambulatory Visit: Payer: Self-pay | Admitting: *Deleted

## 2013-02-27 MED ORDER — METOPROLOL TARTRATE 25 MG PO TABS: 25.0000 mg | ORAL_TABLET | Freq: Two times a day (BID) | ORAL | Status: DC

## 2013-04-09 ENCOUNTER — Other Ambulatory Visit: Payer: Self-pay | Admitting: Internal Medicine

## 2013-04-23 ENCOUNTER — Telehealth: Payer: Self-pay | Admitting: Internal Medicine

## 2013-04-23 NOTE — Telephone Encounter (Signed)
Pt needs PA on generic protonix. [facility] on [address]. Pt has about 1 wk left of med. Pt daughter received a letter from ins company

## 2013-06-14 ENCOUNTER — Telehealth: Payer: Self-pay | Admitting: *Deleted

## 2013-06-14 NOTE — Telephone Encounter (Signed)
Spoke to Amy, asking for samples of Crestor 10 mg. Told Amy I have samples will put at the front desk. Amy said just to hold them we will be in next week for visit. Told her okay.

## 2013-06-18 ENCOUNTER — Telehealth: Payer: Self-pay | Admitting: Internal Medicine

## 2013-06-18 NOTE — Telephone Encounter (Signed)
Pt would like an appt for cpe on a fri about 10am-10:30 for fasting. Pt cannot come in this Friday and the next available cpe slot in the am is not until 7/17. Pt's wife is also going to sch a reg appt w/ you the same day. And they are not early birds. Is it ok to sch late may/early June at this time?

## 2013-06-18 NOTE — Telephone Encounter (Signed)
That is fine as long as it has been a year since last physical.

## 2013-06-19 ENCOUNTER — Other Ambulatory Visit: Payer: Self-pay | Admitting: Cardiology

## 2013-06-19 NOTE — Telephone Encounter (Signed)
Pt has been rsc to 07-12-13

## 2013-06-19 NOTE — Telephone Encounter (Signed)
Rx was sent to pharmacy electronically. 

## 2013-06-21 ENCOUNTER — Encounter: Payer: Medicare Other | Admitting: Internal Medicine

## 2013-07-12 ENCOUNTER — Encounter: Payer: Self-pay | Admitting: Internal Medicine

## 2013-07-12 ENCOUNTER — Ambulatory Visit (INDEPENDENT_AMBULATORY_CARE_PROVIDER_SITE_OTHER): Payer: Medicare Other | Admitting: Internal Medicine

## 2013-07-12 VITALS — BP 130/70 | Temp 98.2°F | Ht 71.5 in | Wt 201.0 lb

## 2013-07-12 DIAGNOSIS — I2510 Atherosclerotic heart disease of native coronary artery without angina pectoris: Secondary | ICD-10-CM

## 2013-07-12 DIAGNOSIS — R5381 Other malaise: Secondary | ICD-10-CM

## 2013-07-12 DIAGNOSIS — E785 Hyperlipidemia, unspecified: Secondary | ICD-10-CM

## 2013-07-12 DIAGNOSIS — I714 Abdominal aortic aneurysm, without rupture, unspecified: Secondary | ICD-10-CM

## 2013-07-12 DIAGNOSIS — Z951 Presence of aortocoronary bypass graft: Secondary | ICD-10-CM

## 2013-07-12 DIAGNOSIS — Z23 Encounter for immunization: Secondary | ICD-10-CM

## 2013-07-12 DIAGNOSIS — R5383 Other fatigue: Secondary | ICD-10-CM

## 2013-07-12 DIAGNOSIS — I4892 Unspecified atrial flutter: Secondary | ICD-10-CM

## 2013-07-12 DIAGNOSIS — Z0000 Encounter for general adult medical examination without abnormal findings: Secondary | ICD-10-CM

## 2013-07-12 DIAGNOSIS — I10 Essential (primary) hypertension: Secondary | ICD-10-CM

## 2013-07-12 DIAGNOSIS — G4730 Sleep apnea, unspecified: Secondary | ICD-10-CM

## 2013-07-12 DIAGNOSIS — I739 Peripheral vascular disease, unspecified: Secondary | ICD-10-CM

## 2013-07-12 LAB — COMPREHENSIVE METABOLIC PANEL
ALK PHOS: 56 U/L (ref 39–117)
ALT: 23 U/L (ref 0–53)
AST: 22 U/L (ref 0–37)
Albumin: 3.7 g/dL (ref 3.5–5.2)
BUN: 21 mg/dL (ref 6–23)
CALCIUM: 9.7 mg/dL (ref 8.4–10.5)
CHLORIDE: 99 meq/L (ref 96–112)
CO2: 29 mEq/L (ref 19–32)
Creatinine, Ser: 1.4 mg/dL (ref 0.4–1.5)
GFR: 53.07 mL/min — ABNORMAL LOW (ref >60.00)
GLUCOSE: 106 mg/dL — AB (ref 70–99)
Potassium: 4.7 mEq/L (ref 3.5–5.1)
Sodium: 137 mEq/L (ref 135–145)
Total Bilirubin: 0.7 mg/dL (ref 0.2–1.2)
Total Protein: 7.8 g/dL (ref 6.0–8.3)

## 2013-07-12 LAB — CBC WITH DIFFERENTIAL/PLATELET
BASOS PCT: 0.5 % (ref 0.0–3.0)
Basophils Absolute: 0.1 10*3/uL (ref 0.0–0.1)
EOS ABS: 0.1 10*3/uL (ref 0.0–0.7)
EOS PCT: 1.1 % (ref 0.0–5.0)
HCT: 40.2 % (ref 39.0–52.0)
HEMOGLOBIN: 13.4 g/dL (ref 13.0–17.0)
LYMPHS PCT: 11.7 % — AB (ref 12.0–46.0)
Lymphs Abs: 1.3 10*3/uL (ref 0.7–4.0)
MCHC: 33.4 g/dL (ref 30.0–36.0)
MCV: 90.6 fl (ref 78.0–100.0)
Monocytes Absolute: 0.6 10*3/uL (ref 0.1–1.0)
Monocytes Relative: 6 % (ref 3.0–12.0)
NEUTROS ABS: 8.7 10*3/uL — AB (ref 1.4–7.7)
NEUTROS PCT: 80.7 % — AB (ref 43.0–77.0)
Platelets: 362 10*3/uL (ref 150.0–400.0)
RBC: 4.44 Mil/uL (ref 4.22–5.81)
RDW: 14.8 % (ref 11.5–15.5)
WBC: 10.8 10*3/uL — AB (ref 4.0–10.5)

## 2013-07-12 LAB — TSH: TSH: 1.66 u[IU]/mL (ref 0.35–4.50)

## 2013-07-12 NOTE — Progress Notes (Signed)
Pre visit review using our clinic review tool, if applicable. No additional management support is needed unless otherwise documented below in the visit note. 

## 2013-07-12 NOTE — Progress Notes (Signed)
Subjective:    Patient ID: Corey Mcdonald, male    DOB: Nov 11, 1923, 80 y.o.   MRN: [id number]  HPI  80 year old patient who is seen today for his biannual followup.  [facility]  Clinically, he is doing reasonably well although under considerable situational stress due to the poor health of his wife.  She presently is hospitalized and receiving palliative care.  Medical problems include coronary artery disease, status post MI in 2012.  This was complicated by atrial flutter/fibrillation and he was on Coumadin anticoagulation until a hospital mission for upper GI bleed secondary to recurrent ulcer disease.  He also has history of AAA status post endovascular repair  Current Allergies:  ! PLETAL   Past Medical History:   Coronary artery disease  Hypertension  Colonic polyps, hx of  paroxysmal atrial flutter  history of sleep apnea  BP H.  remote peptic ulcer disease  low back pain   Past Surgical History:   Coronary artery bypass graft-1997Cardiolite Stress Test-02/2005  Colonoscopy-2002   Family History:   Family History of Cardiovascular disorder  Fam hx Guillain/Barre syndrome  Fam hx Cerebrovascular dz  Family History of CAD Male 1st degree relative <50  father died age 78, stroke  mother died age 78  two brothers 4 sisters  positive for peripheral l vascular disease. Gullion Barr, coronary artery disease and congestive heart failure   Social History:   Married  Former Smoker  Regular exercise-no  Risk Factors:  Exercise: no   Past Medical History  Diagnosis Date  . ABDOMINAL AORTIC ANEURYSM 09/15/2008  . Atrial fibrillation 10/10/2006  . Atrial flutter 08/18/2006  . BENIGN PROSTATIC HYPERTROPHY, HX OF 08/18/2006  . COLONIC POLYPS, HX OF 08/18/2006  . HYPERLIPIDEMIA 12/04/2009  . HYPERTENSION 08/18/2006  . HYPOTENSION 09/04/2008  . MACULAR DEGENERATION 08/18/2006  . PEPTIC ULCER DISEASE, HX OF 08/18/2006  .  RESTLESS LEGS SYNDROME 12/04/2009  . SLEEP APNEA 08/18/2006  . VERTIGO 07/28/2008  . WEAKNESS 06/23/2008  . CORONARY ARTERY DISEASE     non-ST segment MI, March 15, 2010  . Anemia January 2012    admitted with acute MI March 15, 2010 and has significant acute blood loss anemia requiring transfusions of two units of packed RBCs.  Status post upper endoscopy March 22, 2010 without High-Risk bleeding lesion.  History of peptic ulcer disease  . Myocardial infarction 02/2010  . Leg pain   . AAA (abdominal aortic aneurysm)   . NSTEMI (non-ST elevated myocardial infarction) 2010  . PAD (peripheral artery disease)   . PVD (peripheral vascular disease)   . History of Doppler ultrasound 05/2011    h/o claudication, stable ABIs  . History of nuclear stress test 2007    persantine; normal, low risk     History   Social History  . Marital Status: Married    Spouse Name: N/A    Number of Children: 2  . Years of Education: N/A   Occupational History  . Not on file.   Social History Main Topics  . Smoking status: Former Smoker -- 1.00 packs/day for 40 years    Types: Cigarettes    Quit date: 02/22/1987  . Smokeless tobacco: Never Used  . Alcohol Use: No  . Drug Use: No  . Sexual Activity: Not on file   Other Topics Concern  . Not on file   Social History Narrative  . No narrative on file    Past Surgical History  Procedure Laterality Date  . Inguinal hernia repair      Left inguinal  . Coronary artery bypass graft  12/1995    x5; LIMA to ALD, vein graft to RCA (now occluded),SVG to OM, SVG to diagonal  . Abdominal aortic aneurysm repair      stenting   . Cardiac catheterization  02/2010    r/t NSTEMI; loss of SVG to RCA  . Transthoracic echocardiogram  04/2011    EF 55-60%; mild LVH; grade 1 diastolic dysfunction    Family History  Problem Relation Age of Onset  . Heart disease Mother   . Heart disease Father   . Heart disease Sister   . Macular degeneration Sister   .  Heart disease Sister   . Stroke Father   . Stroke Brother   . Hypertension Sister   . Heart Problems Sister     x2    Allergies  Allergen Reactions  . Cilostazol Other (See Comments)    Adverse reaction per patient  . Doxazosin Other (See Comments)    INTOLERANCE     Current Outpatient Prescriptions on File Prior to Visit  Medication Sig Dispense Refill  . aspirin 81 MG tablet Take 81 mg by mouth daily.        Marland Kitchen diltiazem (CARDIZEM CD) 180 MG 24 hr capsule TAKE ONE CAPSULE BY MOUTH ONCE DAILY  90 capsule  0  . docusate sodium (COLACE) 100 MG capsule Take 100 mg by mouth daily.       . hydrochlorothiazide (MICROZIDE) 12.5 MG capsule Take 12.5 mg by mouth daily.      . hydrocortisone (ANUSOL-HC) 25 MG suppository Place 1 suppository (25 mg total) rectally 2 (two) times daily.  12 suppository  3  . metoprolol tartrate (LOPRESSOR) 25 MG tablet Take 1 tablet (25 mg total) by mouth 2 (two) times daily.  60 tablet  6  . Multiple Vitamin (MULTIVITAMIN) tablet Take 1 tablet by mouth daily.       . nitroGLYCERIN (NITROSTAT) 0.4 MG SL tablet Place 1 tablet (0.4 mg total) under the tongue every 5 (five) minutes as needed. FOR CHEST PAIN  25 tablet  6  . pantoprazole (PROTONIX) 40 MG tablet TAKE ONE TABLET BY MOUTH ONCE DAILY  60 tablet  4  . rosuvastatin (CRESTOR) 10 MG tablet Take 10 mg by mouth every evening.        . [DISCONTINUED] carbidopa-levodopa (SINEMET CR) 25-100 MG per tablet Take by mouth. 1/2 tablet at bedtime        No current facility-administered medications on file prior to visit.    BP 130/70  Temp(Src) 98.2 F (36.8 C) (Oral)  Ht 5' 11.5" (1.816 m)  Wt 201 lb (91.173 kg)  BMI 27.65 kg/m2  SpO2 96%    1. Risk factors, based on past  M,S,F history- patient has known coronary artery disease.  Cardiac risk factors include hypertension and dyslipidemia.  2.  Physical activities: Fairly sedentary.  No exertional chest pain  3.  Depression/mood: No history of major  depression, but presently under considerable situational stress due to the poor health of his wife, who is terminal.  [facility] admission to a [facility] community  4.  Hearing: Moderate impairment  5.  ADL's: Independent  6.  Fall risk: Moderate  7.  Home safety: Present resident of a retirement center  8.  Height weight, and visual acuity; height and weight stable.  No change in visual acuity.  Does have a history of  macular degeneration  9.  Counseling: Heart healthy diet encouraged 10. Lab orders based on risk factors: Laboratory data will be reviewed  11. Referral : Not appropriate at this time  12. Care plan: Continue aggressive risk factor modification.  We'll check lipid profile 13. Cognitive assessment: Alert and oriented.  Normal affect.  No cognitive dysfunction     Review of Systems  Constitutional: Negative for fever, chills, appetite change and fatigue.  HENT: Negative for congestion, dental problem, ear pain, hearing loss, sore throat, tinnitus, trouble swallowing and voice change.   Eyes: Negative for pain, discharge and visual disturbance.  Respiratory: Negative for cough, chest tightness, wheezing and stridor.   Cardiovascular: Negative for chest pain, palpitations and leg swelling.  Gastrointestinal: Negative for nausea, vomiting, abdominal pain, diarrhea, constipation, blood in stool and abdominal distention.  Genitourinary: Negative for urgency, hematuria, flank pain, discharge, difficulty urinating and genital sores.  Musculoskeletal: Positive for arthralgias, back pain and gait problem. Negative for joint swelling, myalgias and neck stiffness.  Skin: Negative for rash.  Neurological: Negative for dizziness, syncope, speech difficulty, weakness, numbness and headaches.  Hematological: Negative for adenopathy. Does not bruise/bleed easily.  Psychiatric/Behavioral: Positive for dysphoric mood. Negative for behavioral problems. The patient is nervous/anxious.         Objective:   Physical Exam  Constitutional: He appears well-developed and well-nourished.  HENT:  Head: Normocephalic and atraumatic.  Right Ear: External ear normal.  Left Ear: External ear normal.  Nose: Nose normal.  Mouth/Throat: Oropharynx is clear and moist.  Eyes: Conjunctivae and EOM are normal. Pupils are equal, round, and reactive to light. No scleral icterus.  Neck: Normal range of motion. Neck supple. No JVD present. No thyromegaly present.  Cardiovascular: Regular rhythm.  Exam reveals no gallop and no friction rub.   Murmur heard. Pulmonary/Chest: Effort normal and breath sounds normal. He exhibits no tenderness.  Abdominal: Soft. Bowel sounds are normal. He exhibits no distension and no mass. There is no tenderness.  Genitourinary: Penis normal.  Musculoskeletal: Normal range of motion. He exhibits edema. He exhibits no tenderness.  Lymphadenopathy:    He has no cervical adenopathy.  Neurological: He is alert. He has normal reflexes. No cranial nerve deficit. Coordination normal.  Skin: Skin is warm and dry. No rash noted.  Psychiatric: He has a normal mood and affect. His behavior is normal.          Assessment & Plan:   Preventive health exam Hypertension stable Coronary artery disease.  Status post CABG History of peptic ulcer disease  Continue present regimen, which includes statin therapy.  Samples dispensed Recheck 6 months or as needed

## 2013-07-12 NOTE — Patient Instructions (Signed)
Limit your sodium (Salt) intake  Return in 3 months for follow-up   

## 2013-07-16 ENCOUNTER — Telehealth: Payer: Self-pay | Admitting: Internal Medicine

## 2013-07-16 NOTE — Telephone Encounter (Signed)
Relevant patient education mailed to patient.  

## 2013-07-17 ENCOUNTER — Other Ambulatory Visit: Payer: Self-pay | Admitting: Internal Medicine

## 2013-08-02 ENCOUNTER — Other Ambulatory Visit: Payer: Self-pay | Admitting: Vascular Surgery

## 2013-08-02 LAB — BUN: BUN: 20 mg/dL (ref 6–23)

## 2013-08-02 LAB — CREATININE, SERUM: CREATININE: 1.2 mg/dL (ref 0.50–1.35)

## 2013-08-06 ENCOUNTER — Ambulatory Visit: Payer: Medicare Other | Admitting: Vascular Surgery

## 2013-08-06 ENCOUNTER — Ambulatory Visit
Admission: RE | Admit: 2013-08-06 | Discharge: 2013-08-06 | Disposition: A | Discharge status: Home / Self Care | Payer: Medicare Other | Source: Ambulatory Visit | Attending: Vascular Surgery | Admitting: Vascular Surgery

## 2013-08-06 DIAGNOSIS — Z48812 Encounter for surgical aftercare following surgery on the circulatory system: Secondary | ICD-10-CM

## 2013-08-06 DIAGNOSIS — I714 Abdominal aortic aneurysm, without rupture, unspecified: Secondary | ICD-10-CM

## 2013-08-06 MED ORDER — IOHEXOL 350 MG/ML SOLN
80.0000 mL | Freq: Once | INTRAVENOUS | Status: AC | PRN
Start: 2013-08-06 — End: 2013-08-06
  Administered 2013-08-06: 80 mL via INTRAVENOUS

## 2013-08-12 ENCOUNTER — Encounter: Payer: Self-pay | Admitting: Vascular Surgery

## 2013-08-13 ENCOUNTER — Ambulatory Visit (INDEPENDENT_AMBULATORY_CARE_PROVIDER_SITE_OTHER): Payer: Medicare Other | Admitting: Vascular Surgery

## 2013-08-13 ENCOUNTER — Ambulatory Visit: Payer: Medicare Other | Admitting: Vascular Surgery

## 2013-08-13 ENCOUNTER — Encounter: Payer: Self-pay | Admitting: Vascular Surgery

## 2013-08-13 VITALS — BP 125/49 | HR 65 | Ht 71.5 in | Wt 191.0 lb

## 2013-08-13 DIAGNOSIS — I714 Abdominal aortic aneurysm, without rupture, unspecified: Secondary | ICD-10-CM

## 2013-08-13 NOTE — Progress Notes (Signed)
U. today for followup of his stent graft repair of abdominal aortic aneurysm. He is having worsening difficulty with vision but is quite alert and oriented. He is having difficult time with worsening health issues and his wife and now hospice as involved as well. No symptoms rectal to his aneurysm  Past Medical History  Diagnosis Date  . ABDOMINAL AORTIC ANEURYSM 09/15/2008  . Atrial fibrillation 10/10/2006  . Atrial flutter 08/18/2006  . BENIGN PROSTATIC HYPERTROPHY, HX OF 08/18/2006  . COLONIC POLYPS, HX OF 08/18/2006  . HYPERLIPIDEMIA 12/04/2009  . HYPERTENSION 08/18/2006  . HYPOTENSION 09/04/2008  . MACULAR DEGENERATION 08/18/2006  . PEPTIC ULCER DISEASE, HX OF 08/18/2006  . RESTLESS LEGS SYNDROME 12/04/2009  . SLEEP APNEA 08/18/2006  . VERTIGO 07/28/2008  . WEAKNESS 06/23/2008  . CORONARY ARTERY DISEASE     non-ST segment MI, March 15, 2010  . Anemia January 2012    admitted with acute MI March 15, 2010 and has significant acute blood loss anemia requiring transfusions of two units of packed RBCs.  Status post upper endoscopy March 22, 2010 without High-Risk bleeding lesion.  History of peptic ulcer disease  . Myocardial infarction 02/2010  . Leg pain   . AAA (abdominal aortic aneurysm)   . NSTEMI (non-ST elevated myocardial infarction) 2010  . PAD (peripheral artery disease)   . PVD (peripheral vascular disease)   . History of Doppler ultrasound 05/2011    h/o claudication, stable ABIs  . History of nuclear stress test 2007    persantine; normal, low risk     History  Substance Use Topics  . Smoking status: Former Smoker -- 1.00 packs/day for 40 years    Types: Cigarettes    Quit date: 02/22/1987  . Smokeless tobacco: Never Used  . Alcohol Use: No    Family History  Problem Relation Age of Onset  . Heart disease Mother   . Heart disease Father   . Heart disease Sister   . Macular degeneration Sister   . Heart disease Sister   . Stroke Father   . Stroke Brother   .  Hypertension Sister   . Heart Problems Sister     x2    Allergies  Allergen Reactions  . Cilostazol Other (See Comments)    Adverse reaction per patient  . Doxazosin Other (See Comments)    INTOLERANCE     Current outpatient prescriptions:aspirin 81 MG tablet, Take 81 mg by mouth daily.  , Disp: , Rfl: ;  diltiazem (CARDIZEM CD) 180 MG 24 hr capsule, TAKE ONE CAPSULE BY MOUTH ONCE DAILY, Disp: 90 capsule, Rfl: 3;  docusate sodium (COLACE) 100 MG capsule, Take 100 mg by mouth daily. , Disp: , Rfl: ;  metoprolol tartrate (LOPRESSOR) 25 MG tablet, Take 1 tablet (25 mg total) by mouth 2 (two) times daily., Disp: 60 tablet, Rfl: 6 Multiple Vitamin (MULTIVITAMIN) tablet, Take 1 tablet by mouth daily. , Disp: , Rfl: ;  pantoprazole (PROTONIX) 40 MG tablet, TAKE ONE TABLET BY MOUTH ONCE DAILY, Disp: 60 tablet, Rfl: 4;  rosuvastatin (CRESTOR) 10 MG tablet, Take 10 mg by mouth every evening.  , Disp: , Rfl: ;  hydrochlorothiazide (MICROZIDE) 12.5 MG capsule, Take 12.5 mg by mouth daily., Disp: , Rfl:  hydrocortisone (ANUSOL-HC) 25 MG suppository, Place 1 suppository (25 mg total) rectally 2 (two) times daily., Disp: 12 suppository, Rfl: 3;  nitroGLYCERIN (NITROSTAT) 0.4 MG SL tablet, Place 1 tablet (0.4 mg total) under the tongue every 5 (five) minutes as needed.  FOR CHEST PAIN, Disp: 25 tablet, Rfl: 6;  [DISCONTINUED] carbidopa-levodopa (SINEMET CR) 25-100 MG per tablet, Take by mouth. 1/2 tablet at bedtime , Disp: , Rfl:   BP 125/49  Pulse 65  Ht 5' 11.5" (1.816 m)  Wt 191 lb (86.637 kg)  BMI 26.27 kg/m2  SpO2 96%  Body mass index is 26.27 kg/(m^2).       Physical exam obese gentleman in no acute distress He does have palpable radial and dorsalis pedis pulses Grossly intact neurologically Respirations are equal and nonlabored Abdomen soft nontender I do not palpate an aneurysm  CT scan today was reviewed with the patient. This shows stable positioning of the stent graft with no evidence  of endoleak.  Impression and plan: Stable status post stent graft repair of large infrarenal abdominal aortic aneurysm. I recommend that we got back to two-year interval evaluation and will see him in 2 years with ultrasound to rule out any progression in sac size.

## 2013-08-27 ENCOUNTER — Telehealth: Payer: Self-pay | Admitting: Internal Medicine

## 2013-08-27 MED ORDER — ROSUVASTATIN CALCIUM 10 MG PO TABS: 10.0000 mg | ORAL_TABLET | Freq: Every evening | ORAL | Status: DC

## 2013-08-27 NOTE — Telephone Encounter (Signed)
Pt needs new rx crestor  10 mg #90 w/refills call into [facility]

## 2013-08-27 NOTE — Telephone Encounter (Signed)
Left message Rx sent to pharmacy. 

## 2013-09-06 ENCOUNTER — Encounter: Payer: Medicare Other | Admitting: Internal Medicine

## 2013-09-27 ENCOUNTER — Ambulatory Visit (INDEPENDENT_AMBULATORY_CARE_PROVIDER_SITE_OTHER): Payer: Medicare Other | Admitting: Internal Medicine

## 2013-09-27 ENCOUNTER — Encounter: Payer: Self-pay | Admitting: Internal Medicine

## 2013-09-27 VITALS — BP 130/64 | HR 56 | Temp 97.3°F | Resp 20 | Ht 71.5 in | Wt 200.0 lb

## 2013-09-27 DIAGNOSIS — I714 Abdominal aortic aneurysm, without rupture, unspecified: Secondary | ICD-10-CM

## 2013-09-27 DIAGNOSIS — I4891 Unspecified atrial fibrillation: Secondary | ICD-10-CM

## 2013-09-27 DIAGNOSIS — I10 Essential (primary) hypertension: Secondary | ICD-10-CM

## 2013-09-27 DIAGNOSIS — Z951 Presence of aortocoronary bypass graft: Secondary | ICD-10-CM

## 2013-09-27 NOTE — Patient Instructions (Signed)
Limit your sodium (Salt) intake  Return in 6 months for follow-up  

## 2013-09-27 NOTE — Progress Notes (Signed)
   Subjective:    Patient ID: Corey Mcdonald, male    DOB: Dec 10, 1923, 80 y.o.   MRN: [id number]  HPI  Wt Readings from Last 3 Encounters:  09/27/13 200 lb (90.719 kg)  08/13/13 191 lb (86.637 kg)  07/12/13 201 lb (91.173 kg)    Review of Systems     Objective:   Physical Exam        Assessment & Plan:

## 2013-09-27 NOTE — Progress Notes (Signed)
Pre visit review using our clinic review tool, if applicable. No additional management support is needed unless otherwise documented below in the visit note. 

## 2013-09-27 NOTE — Progress Notes (Signed)
Subjective:    Patient ID: Corey Mcdonald, male    DOB: [birth year], 80 y.o.   MRN: [id number]  HPI  80 year old patient who is seen today in followup.  He has a history of peripheral vascular disease with AAA and has been seen by vascular surgery recently.  Doing reasonably well Complains of decreased auditory acuity. He has coronary artery disease, which has been stable.  Status post CABG.  He has a history of paroxysmal atrial for correlation. Remains on statin therapy, which he continues to tolerate well No cardiopulmonary complaints  Past Medical History  Diagnosis Date  . ABDOMINAL AORTIC ANEURYSM 09/15/2008  . Atrial fibrillation 10/10/2006  . Atrial flutter 08/18/2006  . BENIGN PROSTATIC HYPERTROPHY, HX OF 08/18/2006  . COLONIC POLYPS, HX OF 08/18/2006  . HYPERLIPIDEMIA 12/04/2009  . HYPERTENSION 08/18/2006  . HYPOTENSION 09/04/2008  . MACULAR DEGENERATION 08/18/2006  . PEPTIC ULCER DISEASE, HX OF 08/18/2006  . RESTLESS LEGS SYNDROME 12/04/2009  . SLEEP APNEA 08/18/2006  . VERTIGO 07/28/2008  . WEAKNESS 06/23/2008  . CORONARY ARTERY DISEASE     non-ST segment MI, March 15, 2010  . Anemia January 2012    admitted with acute MI March 15, 2010 and has significant acute blood loss anemia requiring transfusions of two units of packed RBCs.  Status post upper endoscopy March 22, 2010 without High-Risk bleeding lesion.  History of peptic ulcer disease  . Myocardial infarction 02/2010  . Leg pain   . AAA (abdominal aortic aneurysm)   . NSTEMI (non-ST elevated myocardial infarction) 2010  . PAD (peripheral artery disease)   . PVD (peripheral vascular disease)   . History of Doppler ultrasound 05/2011    h/o claudication, stable ABIs  . History of nuclear stress test 2007    persantine; normal, low risk     History   Social History  . Marital Status: Married    Spouse Name: N/A    Number of Children: 2  . Years of Education: N/A   Occupational History  . Not on file.    Social History Main Topics  . Smoking status: Former Smoker -- 1.00 packs/day for 40 years    Types: Cigarettes    Quit date: 02/22/1987  . Smokeless tobacco: Never Used  . Alcohol Use: No  . Drug Use: No  . Sexual Activity: Not on file   Other Topics Concern  . Not on file   Social History Narrative  . No narrative on file    Past Surgical History  Procedure Laterality Date  . Inguinal hernia repair      Left inguinal  . Coronary artery bypass graft  12/1995    x5; LIMA to ALD, vein graft to RCA (now occluded),SVG to OM, SVG to diagonal  . Abdominal aortic aneurysm repair      stenting   . Cardiac catheterization  02/2010    r/t NSTEMI; loss of SVG to RCA  . Transthoracic echocardiogram  04/2011    EF 55-60%; mild LVH; grade 1 diastolic dysfunction    Family History  Problem Relation Age of Onset  . Heart disease Mother   . Heart disease Father   . Heart disease Sister   . Macular degeneration Sister   . Heart disease Sister   . Stroke Father   . Stroke Brother   . Hypertension Sister   . Heart Problems Sister     x2    Allergies  Allergen Reactions  . Cilostazol Other (See Comments)  Adverse reaction per patient  . Doxazosin Other (See Comments)    INTOLERANCE     Current Outpatient Prescriptions on File Prior to Visit  Medication Sig Dispense Refill  . aspirin 81 MG tablet Take 81 mg by mouth daily.        Marland Kitchen. diltiazem (CARDIZEM CD) 180 MG 24 hr capsule TAKE ONE CAPSULE BY MOUTH ONCE DAILY  90 capsule  3  . docusate sodium (COLACE) 100 MG capsule Take 100 mg by mouth daily.       . metoprolol tartrate (LOPRESSOR) 25 MG tablet Take 1 tablet (25 mg total) by mouth 2 (two) times daily.  60 tablet  6  . Multiple Vitamin (MULTIVITAMIN) tablet Take 1 tablet by mouth daily.       . nitroGLYCERIN (NITROSTAT) 0.4 MG SL tablet Place 1 tablet (0.4 mg total) under the tongue every 5 (five) minutes as needed. FOR CHEST PAIN  25 tablet  6  . pantoprazole (PROTONIX)  40 MG tablet TAKE ONE TABLET BY MOUTH ONCE DAILY  60 tablet  4  . rosuvastatin (CRESTOR) 10 MG tablet Take 1 tablet (10 mg total) by mouth every evening.  90 tablet  3  . hydrocortisone (ANUSOL-HC) 25 MG suppository Place 1 suppository (25 mg total) rectally 2 (two) times daily.  12 suppository  3  . [DISCONTINUED] carbidopa-levodopa (SINEMET CR) 25-100 MG per tablet Take by mouth. 1/2 tablet at bedtime        No current facility-administered medications on file prior to visit.    BP 130/64  Pulse 56  Temp(Src) 97.3 F (36.3 C) (Oral)  Resp 20  Ht 5' 11.5" (1.816 m)  Wt 200 lb (90.719 kg)  BMI 27.51 kg/m2  SpO2 99%     Review of Systems  Constitutional: Positive for fatigue. Negative for fever, chills and appetite change.  HENT: Positive for hearing loss. Negative for congestion, dental problem, ear pain, sore throat, tinnitus, trouble swallowing and voice change.   Eyes: Negative for pain, discharge and visual disturbance.  Respiratory: Negative for cough, chest tightness, wheezing and stridor.   Cardiovascular: Negative for chest pain, palpitations and leg swelling.  Gastrointestinal: Negative for nausea, vomiting, abdominal pain, diarrhea, constipation, blood in stool and abdominal distention.  Genitourinary: Negative for urgency, hematuria, flank pain, discharge, difficulty urinating and genital sores.  Musculoskeletal: Negative for arthralgias, back pain, gait problem, joint swelling, myalgias and neck stiffness.  Skin: Negative for rash.  Neurological: Negative for dizziness, syncope, speech difficulty, weakness, numbness and headaches.  Hematological: Negative for adenopathy. Does not bruise/bleed easily.  Psychiatric/Behavioral: Negative for behavioral problems and dysphoric mood. The patient is not nervous/anxious.        Objective:   Physical Exam  Constitutional: He is oriented to person, place, and time. He appears well-developed.  Blood pressure 130/60  HENT:   Head: Normocephalic.  Right Ear: External ear normal.  Left Ear: External ear normal.  Eyes: Conjunctivae and EOM are normal.  Neck: Normal range of motion.  Cardiovascular: Normal rate.   Murmur heard. Pulmonary/Chest: Breath sounds normal.  Abdominal: Bowel sounds are normal.  Musculoskeletal: Normal range of motion. He exhibits edema. He exhibits no tenderness.  Trace lower extremity edema  Neurological: He is alert and oriented to person, place, and time.  Psychiatric: He has a normal mood and affect. His behavior is normal.          Assessment & Plan:  Coronary artery disease, stable Hypertension well controlled off diuretic therapy AAA Paroxysmal  atrial fibrillation Bilateral cerumen impactions.  We'll irrigate until clear  We'll continue restricted salt diet and present regimen Recheck 4-6 months

## 2013-10-11 ENCOUNTER — Encounter: Payer: Self-pay | Admitting: Internal Medicine

## 2013-10-11 ENCOUNTER — Ambulatory Visit (INDEPENDENT_AMBULATORY_CARE_PROVIDER_SITE_OTHER): Payer: Medicare Other | Admitting: Internal Medicine

## 2013-10-11 VITALS — BP 110/56 | HR 57 | Ht 71.0 in | Wt 193.7 lb

## 2013-10-11 DIAGNOSIS — R634 Abnormal weight loss: Secondary | ICD-10-CM | POA: Insufficient documentation

## 2013-10-11 DIAGNOSIS — I480 Paroxysmal atrial fibrillation: Secondary | ICD-10-CM

## 2013-10-11 DIAGNOSIS — E785 Hyperlipidemia, unspecified: Secondary | ICD-10-CM

## 2013-10-11 DIAGNOSIS — I714 Abdominal aortic aneurysm, without rupture, unspecified: Secondary | ICD-10-CM

## 2013-10-11 DIAGNOSIS — I4891 Unspecified atrial fibrillation: Secondary | ICD-10-CM

## 2013-10-11 DIAGNOSIS — I10 Essential (primary) hypertension: Secondary | ICD-10-CM

## 2013-10-11 DIAGNOSIS — Z951 Presence of aortocoronary bypass graft: Secondary | ICD-10-CM

## 2013-10-11 DIAGNOSIS — R61 Generalized hyperhidrosis: Secondary | ICD-10-CM | POA: Insufficient documentation

## 2013-10-11 DIAGNOSIS — I2510 Atherosclerotic heart disease of native coronary artery without angina pectoris: Secondary | ICD-10-CM

## 2013-10-11 MED ORDER — DILTIAZEM HCL ER 120 MG PO CP24: 120.0000 mg | ORAL_CAPSULE | Freq: Every day | ORAL | Status: DC

## 2013-10-11 NOTE — Patient Instructions (Signed)
Your physician has recommended you make the following change in your medication: Decrease diltiazem to 120mg  once daily.   Your physician wants you to follow-up in: 1 year. You will receive a reminder letter in the mail two months in advance. If you don't receive a letter, please call our office to schedule the follow-up appointment.

## 2013-10-11 NOTE — Progress Notes (Signed)
OFFICE NOTE  Chief Complaint:  Routine office visit  Primary Care Physician: Rogelia Boga, MD  HPI:  Corey Mcdonald is an 80 year old gentleman with history of coronary bypass in 1997 and had a non-ST-elevation MI in 2010 which was due to the loss of vein graft to the right coronary. EF was 45%. The rest of his anatomy includes a LIMA to the LAD, a saphenous vein graft to an OM and a saphenous vein graft to a diagonal. The saphenous vein graft to the RCA was occluded in 2013. He also has hypertension which has been well controlled and dyslipidemia on Crestor. In addition, he had an abdominal aortic aneurysm and underwent stent grafting. He also has lower extremity claudication with stable ABIs in April 2013 with 0.77 on the left and 0.92 on the right, otherwise no other symptoms. He is being followed by Dr. early for this, who placed his stent graft. He's recently seen by him in June of this year and felt that his claudication is not lifestyle limiting and did not recommend any further treatment at this time. He does have a history of PAF but no recurrence recently but he is not on Coumadin due to high risk of falls and he has significant visual impairment.   Corey Mcdonald returns today for followup. He reports that he has been doing fairly well. He is having some difficulty with his wife who I guess is aching and may be declining. He's recently had problems with weight loss. He says that his appetite is good and he eats about the same but has lost over 30 pounds. His heart rate is noted to be slower today in blood pressure is borderline low. He also been complaining of night sweats recently.  PMHx:  Past Medical History  Diagnosis Date  . ABDOMINAL AORTIC ANEURYSM 09/15/2008  . Atrial fibrillation 10/10/2006  . Atrial flutter 08/18/2006  . BENIGN PROSTATIC HYPERTROPHY, HX OF 08/18/2006  . COLONIC POLYPS, HX OF 08/18/2006  . HYPERLIPIDEMIA 12/04/2009  . HYPERTENSION 08/18/2006    . HYPOTENSION 09/04/2008  . MACULAR DEGENERATION 08/18/2006  . PEPTIC ULCER DISEASE, HX OF 08/18/2006  . RESTLESS LEGS SYNDROME 12/04/2009  . SLEEP APNEA 08/18/2006  . VERTIGO 07/28/2008  . WEAKNESS 06/23/2008  . CORONARY ARTERY DISEASE     non-ST segment MI, March 15, 2010  . Anemia January 2012    admitted with acute MI March 15, 2010 and has significant acute blood loss anemia requiring transfusions of two units of packed RBCs.  Status post upper endoscopy March 22, 2010 without High-Risk bleeding lesion.  History of peptic ulcer disease  . Myocardial infarction 02/2010  . Leg pain   . AAA (abdominal aortic aneurysm)   . NSTEMI (non-ST elevated myocardial infarction) 2010  . PAD (peripheral artery disease)   . PVD (peripheral vascular disease)   . History of Doppler ultrasound 05/2011    h/o claudication, stable ABIs  . History of nuclear stress test 2007    persantine; normal, low risk     Past Surgical History  Procedure Laterality Date  . Inguinal hernia repair      Left inguinal  . Coronary artery bypass graft  12/1995    x5; LIMA to ALD, vein graft to RCA (now occluded),SVG to OM, SVG to diagonal  . Abdominal aortic aneurysm repair      stenting   . Cardiac catheterization  02/2010    r/t NSTEMI; loss of SVG to RCA  . Transthoracic echocardiogram  04/2011  EF 55-60%; mild LVH; grade 1 diastolic dysfunction    FAMHx:  Family History  Problem Relation Age of Onset  . Heart disease Mother   . Heart disease Father   . Heart disease Sister   . Macular degeneration Sister   . Heart disease Sister   . Stroke Father   . Stroke Brother   . Hypertension Sister   . Heart Problems Sister     x2    SOCHx:   reports that he quit smoking about 26 years ago. His smoking use included Cigarettes. He has a 40 pack-year smoking history. He has never used smokeless tobacco. He reports that he does not drink alcohol or use illicit drugs.  ALLERGIES:  Allergies  Allergen  Reactions  . Cilostazol Other (See Comments)    Adverse reaction per patient  . Doxazosin Other (See Comments)    INTOLERANCE     ROS: A comprehensive review of systems was negative except for: Constitutional: positive for fatigue, night sweats and weight loss Eyes: positive for visual loss Ears, nose, mouth, throat, and face: positive for hearing loss and ear wax Cardiovascular: positive for claudication  HOME MEDS: Current Outpatient Prescriptions  Medication Sig Dispense Refill  . aspirin 81 MG tablet Take 81 mg by mouth daily.        Marland Kitchen. docusate sodium (COLACE) 100 MG capsule Take 100 mg by mouth daily.       . metoprolol tartrate (LOPRESSOR) 25 MG tablet Take 1 tablet (25 mg total) by mouth 2 (two) times daily.  60 tablet  6  . Multiple Vitamin (MULTIVITAMIN) tablet Take 1 tablet by mouth daily.       . nitroGLYCERIN (NITROSTAT) 0.4 MG SL tablet Place 1 tablet (0.4 mg total) under the tongue every 5 (five) minutes as needed. FOR CHEST PAIN  25 tablet  6  . pantoprazole (PROTONIX) 40 MG tablet TAKE ONE TABLET BY MOUTH ONCE DAILY  60 tablet  4  . rosuvastatin (CRESTOR) 10 MG tablet Take 1 tablet (10 mg total) by mouth every evening.  90 tablet  3  . diltiazem (DILACOR XR) 120 MG 24 hr capsule Take 1 capsule (120 mg total) by mouth daily.  30 capsule  11  . [DISCONTINUED] carbidopa-levodopa (SINEMET CR) 25-100 MG per tablet Take by mouth. 1/2 tablet at bedtime        No current facility-administered medications for this visit.    LABS/IMAGING: No results found for this or any previous visit (from the past 48 hour(s)). No results found.  VITALS: BP 110/56  Pulse 57  Ht 5\' 11"  (1.803 m)  Wt 193 lb 11.2 oz (87.862 kg)  BMI 27.03 kg/m2  EXAM: General appearance: alert and no distress ENT:  bilateral cerumen which is not impacted, legal blindness Neck: no adenopathy, no carotid bruit, no JVD, supple, symmetrical, trachea midline and thyroid not enlarged, symmetric, no  tenderness/mass/nodules Lungs: clear to auscultation bilaterally Heart: regular rate and rhythm, S1, S2 normal, no murmur, click, rub or gallop Abdomen: soft, non-tender; bowel sounds normal; no masses,  no organomegaly Extremities: extremities normal, atraumatic, no cyanosis or edema Pulses: 2+ and symmetric Skin: Skin color, texture, turgor normal. No rashes or lesions Neurologic: Grossly normal  EKG: Bradycardia 57, voltage criteria for LVH  ASSESSMENT: 1. Coronary artery disease status post four-vessel CABG with an occluded graft to the right coronary in 1997 2. Ischemic cardiomyopathy EF 45% 3. Abdominal aortic aneurysm status post stent grafting 4. Bilateral claudication with reduced ABIs  5. Hypertension 6. Dyslipidemia 7. Remote history of atrial fibrillation not on warfarin do to legal blindness and high fall risk  PLAN: 1.    Corey Mcdonald has had about 30 pounds and unintentional weight loss. His blood pressure is low normal and heart rate is still in the 50s. I think it may be beneficial to decrease his diltiazem to 120 mg daily. He should stay at his current dose of beta blocker. Concerned about his unintentional weight loss and night sweats. Given his age group there is concern for possible malignancy. This should be pursued further through his primary care provider. From a cardiovascular standpoint he seems to be stable. There is no evidence for heart failure exacerbation. We will plan followup annually her seen as necessary.  Chrystie Nose, MD, [facility] Attending Cardiologist [facility]  Janan Bogie C 10/11/2013, 1:34 PM

## 2013-10-17 ENCOUNTER — Other Ambulatory Visit: Payer: Self-pay | Admitting: Cardiology

## 2013-10-18 NOTE — Telephone Encounter (Signed)
Rx was sent to pharmacy electronically. 

## 2013-11-22 ENCOUNTER — Ambulatory Visit (INDEPENDENT_AMBULATORY_CARE_PROVIDER_SITE_OTHER): Payer: Medicare Other | Admitting: *Deleted

## 2013-11-22 DIAGNOSIS — Z23 Encounter for immunization: Secondary | ICD-10-CM

## 2014-02-12 ENCOUNTER — Telehealth: Payer: Self-pay

## 2014-02-12 NOTE — Telephone Encounter (Signed)
[facility] - Client TELEPHONE ADVICE RECORD [facility] Patient Name: Corey Mcdonald Gender: Male DOB: [birth year] Age: [age] Y 1 M 25 D Return Phone Number: [phone] (Primary) Address: [address] [place]State[place]Zip: [place] [address] Client [facility] - Client Client Site [facility] Day Physician Derryl HarborKwiatkowski, Pete Contact Type Call Call Type Triage / Clinical Caller Name [name] Relationship To Patient Father Return Phone Number [phone] (Primary) Chief Complaint Knee Injury Initial Comment Caller states father is complaining in severe pain in right knee. PreDisposition Did not know what to do Nurse Assessment Nurse: Kemper Durielarke, RN, Lurena Joinerebecca Date/Time Lamount Cohen(Eastern Time): 02/12/2014 9:40:11 AM Confirm and document reason for call. If symptomatic, describe symptoms. ---Caller states father is complaining in severe pain in right knee. Started last night. Has the patient traveled out of the country within the last 30 days? ---Not Applicable Does the patient require triage? ---Yes Related visit to physician within the last 2 weeks? ---No Does the PT have any chronic conditions? (i.e. diabetes, asthma, etc.) ---Yes List chronic conditions. ---HTN. Guidelines Guideline Title Affirmed Question Affirmed Notes Nurse Date/Time Lamount Cohen(Eastern Time) Knee Pain [1] MODERATE pain (e.g., interferes with normal activities, limping) AND [2] present > 3 days Cheryl FlashClarke, RN, Rebecca 02/12/2014 9:40:49 AM Disp. Time Lamount Cohen(Eastern Time) Disposition Final User 02/12/2014 9:44:08 AM See PCP When Office is Open (within 3 days) Yes Kemper Durielarke, RN, Cecil Cobbsebecca Caller Understands: Yes Disagree/Comply: Comply PLEASE NOTE: All timestamps contained within this report are represented as [place] Standard Time. CONFIDENTIALTY NOTICE: This fax transmission is intended only for the addressee. It contains information that  is legally privileged, confidential or otherwise protected from use or disclosure. If you are not the intended recipient, you are strictly prohibited from reviewing, disclosing, copying using or disseminating any of this information or taking any action in reliance on or regarding this information. If you have received this fax in error, please notify us immediately by telephone so that we can arrange for its return to us. Phone: [phone], Toll-Free: [phone], Fax: [phone] Page: 2 of 2 Call Id: [id number] Care Advice Given Per Guideline SEE PCP WITHIN 3 DAYS: You need to be examined within 2 or 3 days. Call your doctor during regular office hours and make an appointment. (Note: if office will be open tomorrow, tell caller to call then, not in 3 days). REST YOUR KNEE for the next couple days: * Avoid activities that worsen your pain. LOCAL HEAT: Apply a warm washcloth or heating pad for 10 minutes three times daily to help increase circulation and improve healing. PAIN MEDICINES: ACETAMINOPHEN (E.G., TYLENOL): IBUPROFEN (E.G., MOTRIN, ADVIL): * You become worse. CALL BACK IF: CARE ADVICE given per Knee Pain (Adult) guideline. After Care Instructions Given Call Event Type User Date / Time Description

## 2014-02-12 NOTE — Telephone Encounter (Signed)
Pt scheduled tomorrow

## 2014-02-13 ENCOUNTER — Encounter: Payer: Self-pay | Admitting: Internal Medicine

## 2014-02-13 ENCOUNTER — Ambulatory Visit (INDEPENDENT_AMBULATORY_CARE_PROVIDER_SITE_OTHER): Payer: Medicare Other | Admitting: Internal Medicine

## 2014-02-13 VITALS — BP 118/64 | HR 69 | Temp 97.4°F | Resp 20 | Ht 71.0 in | Wt 207.0 lb

## 2014-02-13 DIAGNOSIS — M25561 Pain in right knee: Secondary | ICD-10-CM

## 2014-02-13 DIAGNOSIS — Z951 Presence of aortocoronary bypass graft: Secondary | ICD-10-CM

## 2014-02-13 DIAGNOSIS — R350 Frequency of micturition: Secondary | ICD-10-CM

## 2014-02-13 DIAGNOSIS — I481 Persistent atrial fibrillation: Secondary | ICD-10-CM

## 2014-02-13 DIAGNOSIS — I4819 Other persistent atrial fibrillation: Secondary | ICD-10-CM

## 2014-02-13 LAB — POCT URINALYSIS DIPSTICK
BILIRUBIN UA: NEGATIVE
Glucose, UA: NEGATIVE
Ketones, UA: NEGATIVE
LEUKOCYTES UA: NEGATIVE
NITRITE UA: NEGATIVE
PROTEIN UA: NEGATIVE
RBC UA: NEGATIVE
Spec Grav, UA: 1.015
UROBILINOGEN UA: 0.2
pH, UA: 7

## 2014-02-13 NOTE — Patient Instructions (Signed)
Aleve 2 tablets twice daily as needed for right knee pain and swelling  Call or return to clinic prn if these symptoms worsen or fail to improve as anticipated.  Moderate fluid and caffeine use

## 2014-02-13 NOTE — Progress Notes (Signed)
Pre visit review using our clinic review tool, if applicable. No additional management support is needed unless otherwise documented below in the visit note. 

## 2014-02-13 NOTE — Progress Notes (Signed)
Subjective:    Patient ID: Corey Mcdonald, male    DOB: 07/29/1923, 80 y.o.   MRN: [id number]  HPI 80 year old patient who presents with a number of concerns.  He has atrial fibrillation and treated hypertension.  He has stable coronary artery disease. History of complaint that prompted his visit earlier was urinary frequency.  He does consume considerable fluid intake including caffeinated beverages due to fear of dehydration.  For the past 2 days he has developed right knee pain.  No prior history of gout.  Prior to the onset of pain.  He was on his hands and knees doing activities  Past Medical History  Diagnosis Date  . ABDOMINAL AORTIC ANEURYSM 09/15/2008  . Atrial fibrillation 10/10/2006  . Atrial flutter 08/18/2006  . BENIGN PROSTATIC HYPERTROPHY, HX OF 08/18/2006  . COLONIC POLYPS, HX OF 08/18/2006  . HYPERLIPIDEMIA 12/04/2009  . HYPERTENSION 08/18/2006  . HYPOTENSION 09/04/2008  . MACULAR DEGENERATION 08/18/2006  . PEPTIC ULCER DISEASE, HX OF 08/18/2006  . RESTLESS LEGS SYNDROME 12/04/2009  . SLEEP APNEA 08/18/2006  . VERTIGO 07/28/2008  . WEAKNESS 06/23/2008  . CORONARY ARTERY DISEASE     non-ST segment MI, March 15, 2010  . Anemia January 2012    admitted with acute MI March 15, 2010 and has significant acute blood loss anemia requiring transfusions of two units of packed RBCs.  Status post upper endoscopy March 22, 2010 without High-Risk bleeding lesion.  History of peptic ulcer disease  . Myocardial infarction 02/2010  . Leg pain   . AAA (abdominal aortic aneurysm)   . NSTEMI (non-ST elevated myocardial infarction) 2010  . PAD (peripheral artery disease)   . PVD (peripheral vascular disease)   . History of Doppler ultrasound 05/2011    h/o claudication, stable ABIs  . History of nuclear stress test 2007    persantine; normal, low risk     History   Social History  . Marital Status: Married    Spouse Name: N/A    Number of Children: 2  . Years of Education: N/A    Occupational History  . Not on file.   Social History Main Topics  . Smoking status: Former Smoker -- 1.00 packs/day for 40 years    Types: Cigarettes    Quit date: 02/22/1987  . Smokeless tobacco: Never Used  . Alcohol Use: No  . Drug Use: No  . Sexual Activity: Not on file   Other Topics Concern  . Not on file   Social History Narrative    Past Surgical History  Procedure Laterality Date  . Inguinal hernia repair      Left inguinal  . Coronary artery bypass graft  12/1995    x5; LIMA to ALD, vein graft to RCA (now occluded),SVG to OM, SVG to diagonal  . Abdominal aortic aneurysm repair      stenting   . Cardiac catheterization  02/2010    r/t NSTEMI; loss of SVG to RCA  . Transthoracic echocardiogram  04/2011    EF 55-60%; mild LVH; grade 1 diastolic dysfunction    Family History  Problem Relation Age of Onset  . Heart disease Mother   . Heart disease Father   . Heart disease Sister   . Macular degeneration Sister   . Heart disease Sister   . Stroke Father   . Stroke Brother   . Hypertension Sister   . Heart Problems Sister     x2    Allergies  Allergen Reactions  .  Cilostazol Other (See Comments)    Adverse reaction per patient  . Doxazosin Other (See Comments)    INTOLERANCE     Current Outpatient Prescriptions on File Prior to Visit  Medication Sig Dispense Refill  . aspirin 81 MG tablet Take 81 mg by mouth daily.      Marland Kitchen diltiazem (DILACOR XR) 120 MG 24 hr capsule Take 1 capsule (120 mg total) by mouth daily. 30 capsule 11  . docusate sodium (COLACE) 100 MG capsule Take 100 mg by mouth daily.     . metoprolol tartrate (LOPRESSOR) 25 MG tablet TAKE ONE TABLET BY MOUTH TWICE DAILY 60 tablet 11  . Multiple Vitamin (MULTIVITAMIN) tablet Take 1 tablet by mouth daily.     . nitroGLYCERIN (NITROSTAT) 0.4 MG SL tablet Place 1 tablet (0.4 mg total) under the tongue every 5 (five) minutes as needed. FOR CHEST PAIN 25 tablet 6  . pantoprazole (PROTONIX) 40 MG  tablet TAKE ONE TABLET BY MOUTH ONCE DAILY 60 tablet 4  . rosuvastatin (CRESTOR) 10 MG tablet Take 1 tablet (10 mg total) by mouth every evening. 90 tablet 3  . [DISCONTINUED] carbidopa-levodopa (SINEMET CR) 25-100 MG per tablet Take by mouth. 1/2 tablet at bedtime      No current facility-administered medications on file prior to visit.    BP 118/64 mmHg  Pulse 69  Temp(Src) 97.4 F (36.3 C) (Oral)  Resp 20  Ht 5\' 11"  (1.803 m)  Wt 207 lb (93.895 kg)  BMI 28.88 kg/m2  SpO2 97%        Review of Systems  Constitutional: Negative for fever, chills, appetite change and fatigue.  HENT: Negative for congestion, dental problem, ear pain, hearing loss, sore throat, tinnitus, trouble swallowing and voice change.   Eyes: Negative for pain, discharge and visual disturbance.  Respiratory: Negative for cough, chest tightness, wheezing and stridor.   Cardiovascular: Negative for chest pain, palpitations and leg swelling.  Gastrointestinal: Negative for nausea, vomiting, abdominal pain, diarrhea, constipation, blood in stool and abdominal distention.  Genitourinary: Positive for frequency. Negative for dysuria, urgency, hematuria, flank pain, discharge, difficulty urinating and genital sores.  Musculoskeletal: Positive for joint swelling and arthralgias. Negative for myalgias, back pain, gait problem and neck stiffness.  Skin: Negative for rash.  Neurological: Negative for dizziness, syncope, speech difficulty, weakness, numbness and headaches.  Hematological: Negative for adenopathy. Does not bruise/bleed easily.  Psychiatric/Behavioral: Negative for behavioral problems and dysphoric mood. The patient is not nervous/anxious.        Objective:   Physical Exam  Constitutional: He is oriented to person, place, and time. He appears well-developed.  HENT:  Head: Normocephalic.  Right Ear: External ear normal.  Left Ear: External ear normal.  Eyes: Conjunctivae and EOM are normal.  Neck:  Normal range of motion.  Cardiovascular: Normal rate, regular rhythm and normal heart sounds.   Grade 2/6 systolic murmur  Pulmonary/Chest: Breath sounds normal.  Abdominal: Bowel sounds are normal.  Musculoskeletal: Normal range of motion. He exhibits no edema or tenderness.  Right knee is slightly warm to touch with some mild effusion  Neurological: He is alert and oriented to person, place, and time.  Psychiatric: He has a normal mood and affect. His behavior is normal.          Assessment & Plan:    Right knee pain with active inflammation.  Will treat with short-term naproxen and observe Hypertension, stable Urinary frequency.  This probably secondary to BPH.  Will check a UA.  The patient has had significant hypotension with alpha blocker therapy in the past Atrial fibrillation, stable

## 2014-02-28 ENCOUNTER — Ambulatory Visit (INDEPENDENT_AMBULATORY_CARE_PROVIDER_SITE_OTHER): Payer: BLUE CROSS/BLUE SHIELD | Admitting: Internal Medicine

## 2014-02-28 ENCOUNTER — Encounter: Payer: Self-pay | Admitting: Internal Medicine

## 2014-02-28 VITALS — BP 130/66 | HR 69 | Temp 97.8°F | Resp 20 | Ht 71.0 in | Wt 209.0 lb

## 2014-02-28 DIAGNOSIS — I480 Paroxysmal atrial fibrillation: Secondary | ICD-10-CM

## 2014-02-28 DIAGNOSIS — N400 Enlarged prostate without lower urinary tract symptoms: Secondary | ICD-10-CM

## 2014-02-28 DIAGNOSIS — E785 Hyperlipidemia, unspecified: Secondary | ICD-10-CM

## 2014-02-28 DIAGNOSIS — I10 Essential (primary) hypertension: Secondary | ICD-10-CM

## 2014-02-28 MED ORDER — ATORVASTATIN CALCIUM 20 MG PO TABS: 20.0000 mg | ORAL_TABLET | Freq: Every day | ORAL | Status: DC

## 2014-02-28 MED ORDER — TAMSULOSIN HCL 0.4 MG PO CAPS: 0.4000 mg | ORAL_CAPSULE | Freq: Every day | ORAL | Status: DC

## 2014-02-28 NOTE — Progress Notes (Signed)
Pre visit review using our clinic review tool, if applicable. No additional management support is needed unless otherwise documented below in the visit note. 

## 2014-02-28 NOTE — Patient Instructions (Signed)
Limit your sodium (Salt) intake  Please check your blood pressure on a regular basis.  If it is consistently greater than 150/90, please make an office appointment.  Recheck 6 weeks

## 2014-02-28 NOTE — Progress Notes (Signed)
Subjective:    Patient ID: Corey Mcdonald, male    DOB: [birth year], 81 y.o.   MRN: [id number]  HPI 81 year old patient who is seen today with a chief complaint of bilateral hearing loss.  He has noticed some occasional discomfort from the right ear, but none at present He has a history of paroxysmal atrial fibrillation as well as essential hypertension. He has dyslipidemia and has been on Crestor Another complaint is urinary frequency and nocturia.  He has been given a trial of doxazosin in the past, but this resulted in some hypotension.  Past Medical History  Diagnosis Date  . ABDOMINAL AORTIC ANEURYSM 09/15/2008  . Atrial fibrillation 10/10/2006  . Atrial flutter 08/18/2006  . BENIGN PROSTATIC HYPERTROPHY, HX OF 08/18/2006  . COLONIC POLYPS, HX OF 08/18/2006  . HYPERLIPIDEMIA 12/04/2009  . HYPERTENSION 08/18/2006  . HYPOTENSION 09/04/2008  . MACULAR DEGENERATION 08/18/2006  . PEPTIC ULCER DISEASE, HX OF 08/18/2006  . RESTLESS LEGS SYNDROME 12/04/2009  . SLEEP APNEA 08/18/2006  . VERTIGO 07/28/2008  . WEAKNESS 06/23/2008  . CORONARY ARTERY DISEASE     non-ST segment MI, March 15, 2010  . Anemia January 2012    admitted with acute MI March 15, 2010 and has significant acute blood loss anemia requiring transfusions of two units of packed RBCs.  Status post upper endoscopy March 22, 2010 without High-Risk bleeding lesion.  History of peptic ulcer disease  . Myocardial infarction 02/2010  . Leg pain   . AAA (abdominal aortic aneurysm)   . NSTEMI (non-ST elevated myocardial infarction) 2010  . PAD (peripheral artery disease)   . PVD (peripheral vascular disease)   . History of Doppler ultrasound 05/2011    h/o claudication, stable ABIs  . History of nuclear stress test 2007    persantine; normal, low risk     History   Social History  . Marital Status: Married    Spouse Name: N/A    Number of Children: 2  . Years of Education: N/A   Occupational History  . Not on file.    Social History Main Topics  . Smoking status: Former Smoker -- 1.00 packs/day for 40 years    Types: Cigarettes    Quit date: 02/22/1987  . Smokeless tobacco: Never Used  . Alcohol Use: No  . Drug Use: No  . Sexual Activity: Not on file   Other Topics Concern  . Not on file   Social History Narrative    Past Surgical History  Procedure Laterality Date  . Inguinal hernia repair      Left inguinal  . Coronary artery bypass graft  12/1995    x5; LIMA to ALD, vein graft to RCA (now occluded),SVG to OM, SVG to diagonal  . Abdominal aortic aneurysm repair      stenting   . Cardiac catheterization  02/2010    r/t NSTEMI; loss of SVG to RCA  . Transthoracic echocardiogram  04/2011    EF 55-60%; mild LVH; grade 1 diastolic dysfunction    Family History  Problem Relation Age of Onset  . Heart disease Mother   . Heart disease Father   . Heart disease Sister   . Macular degeneration Sister   . Heart disease Sister   . Stroke Father   . Stroke Brother   . Hypertension Sister   . Heart Problems Sister     x2    Allergies  Allergen Reactions  . Cilostazol Other (See Comments)    Adverse reaction per  patient  . Doxazosin Other (See Comments)    INTOLERANCE     Current Outpatient Prescriptions on File Prior to Visit  Medication Sig Dispense Refill  . acetaminophen (TYLENOL) 325 MG tablet Take 650 mg by mouth every 6 (six) hours as needed.    Marland Kitchen aspirin 81 MG tablet Take 81 mg by mouth daily.      Marland Kitchen diltiazem (DILACOR XR) 120 MG 24 hr capsule Take 1 capsule (120 mg total) by mouth daily. 30 capsule 11  . docusate sodium (COLACE) 100 MG capsule Take 100 mg by mouth daily.     . metoprolol tartrate (LOPRESSOR) 25 MG tablet TAKE ONE TABLET BY MOUTH TWICE DAILY 60 tablet 11  . Multiple Vitamin (MULTIVITAMIN) tablet Take 1 tablet by mouth daily.     . nitroGLYCERIN (NITROSTAT) 0.4 MG SL tablet Place 1 tablet (0.4 mg total) under the tongue every 5 (five) minutes as needed. FOR  CHEST PAIN 25 tablet 6  . pantoprazole (PROTONIX) 40 MG tablet TAKE ONE TABLET BY MOUTH ONCE DAILY 60 tablet 4  . rosuvastatin (CRESTOR) 10 MG tablet Take 1 tablet (10 mg total) by mouth every evening. 90 tablet 3  . [DISCONTINUED] carbidopa-levodopa (SINEMET CR) 25-100 MG per tablet Take by mouth. 1/2 tablet at bedtime      No current facility-administered medications on file prior to visit.    BP 130/66 mmHg  Pulse 69  Temp(Src) 97.8 F (36.6 C) (Oral)  Resp 20  Ht  (1.803 m)  Wt 209 lb (94.802 kg)  BMI 29.16 kg/m2  SpO2 96%       Review of Systems  Constitutional: Negative for fever, chills, appetite change and fatigue.  HENT: Positive for hearing loss. Negative for congestion, dental problem, ear pain, sore throat, tinnitus, trouble swallowing and voice change.   Eyes: Negative for pain, discharge and visual disturbance.  Respiratory: Negative for cough, chest tightness, wheezing and stridor.   Cardiovascular: Negative for chest pain, palpitations and leg swelling.  Gastrointestinal: Negative for nausea, vomiting, abdominal pain, diarrhea, constipation, blood in stool and abdominal distention.  Genitourinary: Positive for frequency and decreased urine volume. Negative for urgency, hematuria, flank pain, discharge, difficulty urinating and genital sores.  Musculoskeletal: Negative for myalgias, back pain, joint swelling, arthralgias, gait problem and neck stiffness.  Skin: Negative for rash.  Neurological: Negative for dizziness, syncope, speech difficulty, weakness, numbness and headaches.  Hematological: Negative for adenopathy. Does not bruise/bleed easily.  Psychiatric/Behavioral: Negative for behavioral problems and dysphoric mood. The patient is not nervous/anxious.        Objective:   Physical Exam  Constitutional: He is oriented to person, place, and time. He appears well-developed.  Blood pressure 130/70  HENT:  Head: Normocephalic.  Right Ear: External  ear normal.  Left Ear: External ear normal.  Considerable cerumen in both canals  Eyes: Conjunctivae and EOM are normal.  Neck: Normal range of motion.  Cardiovascular: Normal rate, regular rhythm and normal heart sounds.   Pulmonary/Chest: Breath sounds normal.  Abdominal: Bowel sounds are normal.  Musculoskeletal: Normal range of motion. He exhibits no edema or tenderness.  Neurological: He is alert and oriented to person, place, and time.  Psychiatric: He has a normal mood and affect. His behavior is normal.          Assessment & Plan:  Bilateral cerumen impactions.  Will irrigate until clear History of atrial fibrillation.  Presently normal sinus rhythm Symptomatic BPH.  We'll give a trial of Flomax, which  should have much less of a blood pressure lowering effect compared with doxazosin.  We'll also place diltiazem on hold. Dyslipidemia.  We'll switch to atorvastatin due to cost considerations (diltiazem discontinued) Essential hypertension.  Blood pressure low normal.  We'll continue metoprolol and discontinue diltiazem  Recheck 6 weeks

## 2014-03-05 ENCOUNTER — Telehealth: Payer: Self-pay | Admitting: Internal Medicine

## 2014-03-05 NOTE — Telephone Encounter (Signed)
Noted  

## 2014-03-05 NOTE — Telephone Encounter (Signed)
Patient Name: Corey Mcdonald  DOB: Jul 01, 1923    Nurse Assessment      Guidelines    Guideline Title Affirmed Question Affirmed Notes       Final Disposition User   FINAL ATTEMPT MADE - message left Sherilyn CooterHenry, RN, Corey Mcdonald

## 2014-03-07 ENCOUNTER — Encounter: Payer: Self-pay | Admitting: Internal Medicine

## 2014-03-07 ENCOUNTER — Ambulatory Visit (INDEPENDENT_AMBULATORY_CARE_PROVIDER_SITE_OTHER): Payer: BLUE CROSS/BLUE SHIELD | Admitting: Internal Medicine

## 2014-03-07 VITALS — BP 130/64 | HR 68 | Temp 98.0°F | Resp 20 | Ht 71.0 in | Wt 209.0 lb

## 2014-03-07 DIAGNOSIS — H60393 Other infective otitis externa, bilateral: Secondary | ICD-10-CM

## 2014-03-07 DIAGNOSIS — I10 Essential (primary) hypertension: Secondary | ICD-10-CM

## 2014-03-07 MED ORDER — NEOMYCIN-POLYMYXIN-HC 3.5-10000-1 OT SUSP
3.0000 [drp] | Freq: Four times a day (QID) | OTIC | Status: DC
Start: 2014-03-07 — End: 2014-12-19

## 2014-03-07 NOTE — Progress Notes (Signed)
Subjective:    Patient ID: Corey Mcdonald, male    DOB: 1923/05/03, 81 y.o.   MRN: [id number]  HPI 81 year old patient who was seen recently with bilateral cerumen impactions.  With the past few days he has had some mild left ear discomfort as noted.  Drainage from both ears.  His hearing has been well maintained, although was markedly impaired earlier with the cerumen impactions.  No fever or significant pain or other constitutional complaints  Past Medical History  Diagnosis Date  . ABDOMINAL AORTIC ANEURYSM 09/15/2008  . Atrial fibrillation 10/10/2006  . Atrial flutter 08/18/2006  . BENIGN PROSTATIC HYPERTROPHY, HX OF 08/18/2006  . COLONIC POLYPS, HX OF 08/18/2006  . HYPERLIPIDEMIA 12/04/2009  . HYPERTENSION 08/18/2006  . HYPOTENSION 09/04/2008  . MACULAR DEGENERATION 08/18/2006  . PEPTIC ULCER DISEASE, HX OF 08/18/2006  . RESTLESS LEGS SYNDROME 12/04/2009  . SLEEP APNEA 08/18/2006  . VERTIGO 07/28/2008  . WEAKNESS 06/23/2008  . CORONARY ARTERY DISEASE     non-ST segment MI, March 15, 2010  . Anemia January 2012    admitted with acute MI March 15, 2010 and has significant acute blood loss anemia requiring transfusions of two units of packed RBCs.  Status post upper endoscopy March 22, 2010 without High-Risk bleeding lesion.  History of peptic ulcer disease  . Myocardial infarction 02/2010  . Leg pain   . AAA (abdominal aortic aneurysm)   . NSTEMI (non-ST elevated myocardial infarction) 2010  . PAD (peripheral artery disease)   . PVD (peripheral vascular disease)   . History of Doppler ultrasound 05/2011    h/o claudication, stable ABIs  . History of nuclear stress test 2007    persantine; normal, low risk     History   Social History  . Marital Status: Married    Spouse Name: N/A    Number of Children: 2  . Years of Education: N/A   Occupational History  . Not on file.   Social History Main Topics  . Smoking status: Former Smoker -- 1.00 packs/day for 40 years    Types:  Cigarettes    Quit date: 02/22/1987  . Smokeless tobacco: Never Used  . Alcohol Use: No  . Drug Use: No  . Sexual Activity: Not on file   Other Topics Concern  . Not on file   Social History Narrative    Past Surgical History  Procedure Laterality Date  . Inguinal hernia repair      Left inguinal  . Coronary artery bypass graft  12/1995    x5; LIMA to ALD, vein graft to RCA (now occluded),SVG to OM, SVG to diagonal  . Abdominal aortic aneurysm repair      stenting   . Cardiac catheterization  02/2010    r/t NSTEMI; loss of SVG to RCA  . Transthoracic echocardiogram  04/2011    EF 55-60%; mild LVH; grade 1 diastolic dysfunction    Family History  Problem Relation Age of Onset  . Heart disease Mother   . Heart disease Father   . Heart disease Sister   . Macular degeneration Sister   . Heart disease Sister   . Stroke Father   . Stroke Brother   . Hypertension Sister   . Heart Problems Sister     x2    Allergies  Allergen Reactions  . Cilostazol Other (See Comments)    Adverse reaction per patient  . Doxazosin Other (See Comments)    INTOLERANCE     Current Outpatient Prescriptions  on File Prior to Visit  Medication Sig Dispense Refill  . acetaminophen (TYLENOL) 325 MG tablet Take 650 mg by mouth every 6 (six) hours as needed.    Marland Kitchen. aspirin 81 MG tablet Take 81 mg by mouth daily.      Marland Kitchen. atorvastatin (LIPITOR) 20 MG tablet Take 1 tablet (20 mg total) by mouth daily. 90 tablet 3  . docusate sodium (COLACE) 100 MG capsule Take 100 mg by mouth daily.     . metoprolol tartrate (LOPRESSOR) 25 MG tablet TAKE ONE TABLET BY MOUTH TWICE DAILY 60 tablet 11  . Multiple Vitamin (MULTIVITAMIN) tablet Take 1 tablet by mouth daily.     . nitroGLYCERIN (NITROSTAT) 0.4 MG SL tablet Place 1 tablet (0.4 mg total) under the tongue every 5 (five) minutes as needed. FOR CHEST PAIN 25 tablet 6  . pantoprazole (PROTONIX) 40 MG tablet TAKE ONE TABLET BY MOUTH ONCE DAILY 60 tablet 4  .  tamsulosin (FLOMAX) 0.4 MG CAPS capsule Take 1 capsule (0.4 mg total) by mouth daily. 30 capsule 3  . [DISCONTINUED] carbidopa-levodopa (SINEMET CR) 25-100 MG per tablet Take by mouth. 1/2 tablet at bedtime      No current facility-administered medications on file prior to visit.    BP 130/64 mmHg  Pulse 68  Temp(Src) 98 F (36.7 C) (Oral)  Resp 20  Ht 5\' 11"  (1.803 m)  Wt 209 lb (94.802 kg)  BMI 29.16 kg/m2       Review of Systems  Constitutional: Negative for fever, chills, appetite change and fatigue.  HENT: Positive for ear discharge and ear pain. Negative for congestion, dental problem, hearing loss, sore throat, tinnitus, trouble swallowing and voice change.   Eyes: Negative for pain, discharge and visual disturbance.  Respiratory: Negative for cough, chest tightness, wheezing and stridor.   Cardiovascular: Negative for chest pain, palpitations and leg swelling.  Gastrointestinal: Negative for nausea, vomiting, abdominal pain, diarrhea, constipation, blood in stool and abdominal distention.  Genitourinary: Negative for urgency, hematuria, flank pain, discharge, difficulty urinating and genital sores.  Musculoskeletal: Negative for myalgias, back pain, joint swelling, arthralgias, gait problem and neck stiffness.  Skin: Negative for rash.  Neurological: Negative for dizziness, syncope, speech difficulty, weakness, numbness and headaches.  Hematological: Negative for adenopathy. Does not bruise/bleed easily.  Psychiatric/Behavioral: Negative for behavioral problems and dysphoric mood. The patient is not nervous/anxious.        Objective:   Physical Exam  Constitutional: He appears well-developed and well-nourished. No distress.  HENT:  The right canal was slightly inflamed and narrowed but no wax or inflammatory debris The left canal also inflamed and narrowed inflammatory debris was present and possibly a small amount of cerumen.  The left canal gently irrigated           Assessment & Plan:    Bilateral otitis externa.  Will treat with otic drops Hypertension, stable  Return as needed

## 2014-03-07 NOTE — Patient Instructions (Signed)

## 2014-03-07 NOTE — Progress Notes (Signed)
Pre visit review using our clinic review tool, if applicable. No additional management support is needed unless otherwise documented below in the visit note. 

## 2014-04-25 ENCOUNTER — Other Ambulatory Visit: Payer: Self-pay | Admitting: Internal Medicine

## 2014-04-25 NOTE — Telephone Encounter (Signed)
Rx(s) sent to pharmacy electronically.  

## 2014-04-30 ENCOUNTER — Telehealth: Payer: Self-pay | Admitting: Internal Medicine

## 2014-04-30 DIAGNOSIS — G4730 Sleep apnea, unspecified: Secondary | ICD-10-CM

## 2014-04-30 NOTE — Telephone Encounter (Signed)
Pt daughter Amy called to req a referral for a sleep study.

## 2014-05-01 NOTE — Telephone Encounter (Signed)
Please see message and advise 

## 2014-05-01 NOTE — Telephone Encounter (Signed)
Please set up home sleep study

## 2014-05-02 NOTE — Telephone Encounter (Signed)
Spoke to Amy, told her order for sleep study done and someone will contact them regarding study. Amy verbalized understanding.

## 2014-07-16 ENCOUNTER — Other Ambulatory Visit: Payer: Self-pay | Admitting: Internal Medicine

## 2014-08-01 ENCOUNTER — Ambulatory Visit (INDEPENDENT_AMBULATORY_CARE_PROVIDER_SITE_OTHER): Payer: Medicare Other | Admitting: Family Medicine

## 2014-08-01 ENCOUNTER — Encounter: Payer: Self-pay | Admitting: Family Medicine

## 2014-08-01 VITALS — BP 132/80 | HR 101 | Temp 97.6°F | Ht 71.0 in | Wt 206.6 lb

## 2014-08-01 DIAGNOSIS — H6123 Impacted cerumen, bilateral: Secondary | ICD-10-CM

## 2014-08-01 DIAGNOSIS — T1592XA Foreign body on external eye, part unspecified, left eye, initial encounter: Secondary | ICD-10-CM

## 2014-08-01 NOTE — Progress Notes (Signed)
Subjective:    Patient ID: Corey Mcdonald, male    DOB: July 27, 1923, 81 y.o.   MRN: [id number]  HPI Patient seen with complaints of bilateral ear "popping" for the past week. He denies any sudden hearing changes. No nasal congestion. No ear drainage. No vertigo or dizziness. Denies any ear pain. No alleviating or exacerbating factors. He has history of advanced macular degeneration History of frequent cerumen buildup in the past.  Also some irritation of right eye past few days.  No injury.  Past Medical History  Diagnosis Date  . ABDOMINAL AORTIC ANEURYSM 09/15/2008  . Atrial fibrillation 10/10/2006  . Atrial flutter 08/18/2006  . BENIGN PROSTATIC HYPERTROPHY, HX OF 08/18/2006  . COLONIC POLYPS, HX OF 08/18/2006  . HYPERLIPIDEMIA 12/04/2009  . HYPERTENSION 08/18/2006  . HYPOTENSION 09/04/2008  . MACULAR DEGENERATION 08/18/2006  . PEPTIC ULCER DISEASE, HX OF 08/18/2006  . RESTLESS LEGS SYNDROME 12/04/2009  . SLEEP APNEA 08/18/2006  . VERTIGO 07/28/2008  . WEAKNESS 06/23/2008  . CORONARY ARTERY DISEASE     non-ST segment MI, March 15, 2010  . Anemia January 2012    admitted with acute MI March 15, 2010 and has significant acute blood loss anemia requiring transfusions of two units of packed RBCs.  Status post upper endoscopy March 22, 2010 without High-Risk bleeding lesion.  History of peptic ulcer disease  . Myocardial infarction 02/2010  . Leg pain   . AAA (abdominal aortic aneurysm)   . NSTEMI (non-ST elevated myocardial infarction) 2010  . PAD (peripheral artery disease)   . PVD (peripheral vascular disease)   . History of Doppler ultrasound 05/2011    h/o claudication, stable ABIs  . History of nuclear stress test 2007    persantine; normal, low risk    Past Surgical History  Procedure Laterality Date  . Inguinal hernia repair      Left inguinal  . Coronary artery bypass graft  12/1995    x5; LIMA to ALD, vein graft to RCA (now occluded),SVG to OM, SVG to diagonal  . Abdominal  aortic aneurysm repair      stenting   . Cardiac catheterization  02/2010    r/t NSTEMI; loss of SVG to RCA  . Transthoracic echocardiogram  04/2011    EF 55-60%; mild LVH; grade 1 diastolic dysfunction    reports that he quit smoking about 27 years ago. His smoking use included Cigarettes. He has a 40 pack-year smoking history. He has never used smokeless tobacco. He reports that he does not drink alcohol or use illicit drugs. family history includes Heart Problems in his sister; Heart disease in his father, mother, sister, and sister; Hypertension in his sister; Macular degeneration in his sister; Stroke in his brother and father. Allergies  Allergen Reactions  . Cilostazol Other (See Comments)    Adverse reaction per patient  . Doxazosin Other (See Comments)    INTOLERANCE       Review of Systems  Constitutional: Negative for fever and chills.  HENT: Negative for congestion and ear pain.        Objective:   Physical Exam  Constitutional: He appears well-developed and well-nourished.  HENT:  Mouth/Throat: Oropharynx is clear and moist.  Cerumen in ear canals bilaterally. Obscures view of eardrums  Eyes:  Hair removed from left eye.  Pt felt immediately better afterwards. Conjunctiva normal.  No other foreign bodies noted.          Assessment & Plan:  Bilateral cerumen ears. Gentle irrigation. Removed  and patient symptomatically improved afterwards.  Foreign body (hair) removed from left eye- superficial and not embedded.

## 2014-08-01 NOTE — Progress Notes (Signed)
Pre visit review using our clinic review tool, if applicable. No additional management support is needed unless otherwise documented below in the visit note. 

## 2014-08-14 ENCOUNTER — Encounter: Payer: Self-pay | Admitting: Internal Medicine

## 2014-10-02 ENCOUNTER — Telehealth: Payer: Self-pay | Admitting: Internal Medicine

## 2014-10-02 DIAGNOSIS — G4733 Obstructive sleep apnea (adult) (pediatric): Secondary | ICD-10-CM | POA: Diagnosis not present

## 2014-10-02 NOTE — Telephone Encounter (Signed)
ERROR/njr

## 2014-10-06 DIAGNOSIS — G4733 Obstructive sleep apnea (adult) (pediatric): Secondary | ICD-10-CM | POA: Diagnosis not present

## 2014-10-07 ENCOUNTER — Other Ambulatory Visit: Payer: Self-pay | Admitting: *Deleted

## 2014-10-07 DIAGNOSIS — G4730 Sleep apnea, unspecified: Secondary | ICD-10-CM

## 2014-10-09 ENCOUNTER — Other Ambulatory Visit: Payer: Self-pay | Admitting: Internal Medicine

## 2014-10-09 DIAGNOSIS — G4730 Sleep apnea, unspecified: Secondary | ICD-10-CM

## 2014-10-17 ENCOUNTER — Encounter: Payer: Self-pay | Admitting: Cardiovascular Disease

## 2014-10-17 ENCOUNTER — Encounter: Payer: Self-pay | Admitting: Internal Medicine

## 2014-10-17 ENCOUNTER — Ambulatory Visit: Payer: Medicare Other | Admitting: Internal Medicine

## 2014-10-17 ENCOUNTER — Other Ambulatory Visit: Payer: Self-pay | Admitting: Internal Medicine

## 2014-10-17 ENCOUNTER — Ambulatory Visit: Payer: BLUE CROSS/BLUE SHIELD | Admitting: Internal Medicine

## 2014-10-20 NOTE — Telephone Encounter (Signed)
REFILL 

## 2014-11-14 ENCOUNTER — Encounter: Payer: Self-pay | Admitting: Internal Medicine

## 2014-11-14 ENCOUNTER — Ambulatory Visit (INDEPENDENT_AMBULATORY_CARE_PROVIDER_SITE_OTHER): Payer: Medicare Other | Admitting: Internal Medicine

## 2014-11-14 VITALS — BP 128/70 | HR 83 | Ht 71.5 in | Wt 205.6 lb

## 2014-11-14 DIAGNOSIS — Z8679 Personal history of other diseases of the circulatory system: Secondary | ICD-10-CM

## 2014-11-14 DIAGNOSIS — I480 Paroxysmal atrial fibrillation: Secondary | ICD-10-CM

## 2014-11-14 DIAGNOSIS — Z9889 Other specified postprocedural states: Secondary | ICD-10-CM | POA: Diagnosis not present

## 2014-11-14 DIAGNOSIS — I10 Essential (primary) hypertension: Secondary | ICD-10-CM | POA: Diagnosis not present

## 2014-11-14 DIAGNOSIS — Z951 Presence of aortocoronary bypass graft: Secondary | ICD-10-CM | POA: Diagnosis not present

## 2014-11-14 LAB — BASIC METABOLIC PANEL
BUN: 21 mg/dL (ref 7–25)
CHLORIDE: 102 mmol/L (ref 98–110)
CO2: 28 mmol/L (ref 20–31)
Calcium: 9.3 mg/dL (ref 8.6–10.3)
Creat: 1.29 mg/dL — ABNORMAL HIGH (ref 0.70–1.11)
Glucose, Bld: 75 mg/dL (ref 65–99)
POTASSIUM: 5.1 mmol/L (ref 3.5–5.3)
Sodium: 138 mmol/L (ref 135–146)

## 2014-11-14 LAB — CBC
HEMATOCRIT: 35.7 % — AB (ref 39.0–52.0)
Hemoglobin: 11.4 g/dL — ABNORMAL LOW (ref 13.0–17.0)
MCH: 25.3 pg — ABNORMAL LOW (ref 26.0–34.0)
MCHC: 31.9 g/dL (ref 30.0–36.0)
MCV: 79.2 fL (ref 78.0–100.0)
MPV: 9 fL (ref 8.6–12.4)
Platelets: 333 10*3/uL (ref 150–400)
RBC: 4.51 MIL/uL (ref 4.22–5.81)
RDW: 17.3 % — ABNORMAL HIGH (ref 11.5–15.5)
WBC: 9.1 10*3/uL (ref 4.0–10.5)

## 2014-11-14 MED ORDER — APIXABAN 2.5 MG PO TABS: 2.5000 mg | ORAL_TABLET | Freq: Two times a day (BID) | ORAL | Status: DC

## 2014-11-14 NOTE — Patient Instructions (Signed)
Your physician recommends that you schedule a follow-up appointment in: 4-6 WEEKS WITH DR HILTY  START ELIQUIS 2.5 MG ONE TABLET TWICE DAILY  Your physician recommends that you HAVE LAB WORK TODAY

## 2014-11-14 NOTE — Progress Notes (Signed)
OFFICE NOTE  Chief Complaint:  Routine office visit, some fatigue  Primary Care Physician: Rogelia Boga, MD  HPI:  Corey Mcdonald is an 81 year old gentleman with history of coronary bypass in 1997 and had a non-ST-elevation MI in 2010 which was due to the loss of vein graft to the right coronary. EF was 45%. The rest of his anatomy includes a LIMA to the LAD, a saphenous vein graft to an OM and a saphenous vein graft to a diagonal. The saphenous vein graft to the RCA was occluded in 2013. He also has hypertension which has been well controlled and dyslipidemia on Crestor. In addition, he had an abdominal aortic aneurysm and underwent stent grafting. He also has lower extremity claudication with stable ABIs in April 2013 with 0.77 on the left and 0.92 on the right, otherwise no other symptoms. He is being followed by Dr. early for this, who placed his stent graft. He's recently seen by him in June of this year and felt that his claudication is not lifestyle limiting and did not recommend any further treatment at this time. He does have a history of PAF but no recurrence recently but he is not on Coumadin due to high risk of falls and he has significant visual impairment.   [name] returns today for followup. He reports that he has been doing fairly well. He is having some difficulty with his wife who I guess is aching and may be declining. He's recently had problems with weight loss. He says that his appetite is good and he eats about the same but has lost over 30 pounds. His heart rate is noted to be slower today in blood pressure is borderline low. He also been complaining of night sweats recently.  I the pleasure see [name] back in the office today. He is overall doing fairly well although does report some progressive fatigue. He seems to have noted this over the past several years, not necessarily recently. He does have a history of paroxysmal atrial fibrillation in  the distant past but has been in sinus rhythm most of the time when I see him in the office. Today however he is in atrial fibrillation. He's been maintained on aspirin apparently due to visual impairment and high falls and was previously on warfarin. I had a discussion today in the office with his daughter who says that he is very compliant with his medicines as she arranges them for him and I did not feel that visual impairment would be something that she keep him from being on anticoagulation. He also is not having any falls. He in fact walks about a mile to mile and half every day.  PMHx:  Past Medical History  Diagnosis Date  . ABDOMINAL AORTIC ANEURYSM 09/15/2008  . Atrial fibrillation 10/10/2006  . Atrial flutter 08/18/2006  . BENIGN PROSTATIC HYPERTROPHY, HX OF 08/18/2006  . COLONIC POLYPS, HX OF 08/18/2006  . HYPERLIPIDEMIA 12/04/2009  . HYPERTENSION 08/18/2006  . HYPOTENSION 09/04/2008  . MACULAR DEGENERATION 08/18/2006  . PEPTIC ULCER DISEASE, HX OF 08/18/2006  . RESTLESS LEGS SYNDROME 12/04/2009  . SLEEP APNEA 08/18/2006  . VERTIGO 07/28/2008  . WEAKNESS 06/23/2008  . CORONARY ARTERY DISEASE     non-ST segment MI, March 15, 2010  . Anemia January 2012    admitted with acute MI March 15, 2010 and has significant acute blood loss anemia requiring transfusions of two units of packed RBCs.  Status post upper endoscopy March 22, 2010 without High-Risk  bleeding lesion.  History of peptic ulcer disease  . Myocardial infarction 02/2010  . Leg pain   . AAA (abdominal aortic aneurysm)   . NSTEMI (non-ST elevated myocardial infarction) 2010  . PAD (peripheral artery disease)   . PVD (peripheral vascular disease)   . History of Doppler ultrasound 05/2011    h/o claudication, stable ABIs  . History of nuclear stress test 2007    persantine; normal, low risk     Past Surgical History  Procedure Laterality Date  . Inguinal hernia repair      Left inguinal  . Coronary artery bypass graft   12/1995    x5; LIMA to ALD, vein graft to RCA (now occluded),SVG to OM, SVG to diagonal  . Abdominal aortic aneurysm repair      stenting   . Cardiac catheterization  02/2010    r/t NSTEMI; loss of SVG to RCA  . Transthoracic echocardiogram  04/2011    EF 55-60%; mild LVH; grade 1 diastolic dysfunction    FAMHx:  Family History  Problem Relation Age of Onset  . Heart disease Mother   . Heart disease Father   . Heart disease Sister   . Macular degeneration Sister   . Heart disease Sister   . Stroke Father   . Stroke Brother   . Hypertension Sister   . Heart Problems Sister     x2    SOCHx:   reports that he quit smoking about 27 years ago. His smoking use included Cigarettes. He has a 40 pack-year smoking history. He has never used smokeless tobacco. He reports that he does not drink alcohol or use illicit drugs.  ALLERGIES:  Allergies  Allergen Reactions  . Cilostazol Other (See Comments)    Adverse reaction per patient  . Doxazosin Other (See Comments)    INTOLERANCE     ROS: A comprehensive review of systems was negative except for: Constitutional: positive for fatigue, night sweats and weight loss Eyes: positive for visual loss Ears, nose, mouth, throat, and face: positive for hearing loss and ear wax Cardiovascular: positive for claudication  HOME MEDS: Current Outpatient Prescriptions  Medication Sig Dispense Refill  . acetaminophen (TYLENOL) 325 MG tablet Take 650 mg by mouth every 6 (six) hours as needed.    Marland Kitchen aspirin 81 MG tablet Take 81 mg by mouth daily.      Marland Kitchen atorvastatin (LIPITOR) 20 MG tablet Take 1 tablet (20 mg total) by mouth daily. 90 tablet 3  . docusate sodium (COLACE) 100 MG capsule Take 100 mg by mouth daily.     . metoprolol tartrate (LOPRESSOR) 25 MG tablet TAKE ONE TABLET BY MOUTH TWICE DAILY 60 tablet 1  . Multiple Vitamin (MULTIVITAMIN) tablet Take 1 tablet by mouth daily.     Marland Kitchen neomycin-polymyxin-hydrocortisone (CORTISPORIN) 3.5-10000-1  otic suspension Place 3 drops into both ears 4 (four) times daily. 10 mL 1  . nitroGLYCERIN (NITROSTAT) 0.4 MG SL tablet Place 1 tablet (0.4 mg total) under the tongue every 5 (five) minutes as needed. FOR CHEST PAIN 25 tablet 6  . pantoprazole (PROTONIX) 40 MG tablet TAKE ONE TABLET BY MOUTH ONCE DAILY 60 tablet 3  . tamsulosin (FLOMAX) 0.4 MG CAPS capsule TAKE ONE CAPSULE BY MOUTH ONCE DAILY 30 capsule 5  . apixaban (ELIQUIS) 2.5 MG TABS tablet Take 1 tablet (2.5 mg total) by mouth 2 (two) times daily. 60 tablet 6  . [DISCONTINUED] carbidopa-levodopa (SINEMET CR) 25-100 MG per tablet Take by mouth. 1/2 tablet at  bedtime      No current facility-administered medications for this visit.    LABS/IMAGING: No results found for this or any previous visit (from the past 48 hour(s)). No results found.  VITALS: BP 128/70 mmHg  Pulse 83  Ht 5' 11.5" (1.816 m)  Wt 205 lb 9.6 oz (93.26 kg)  BMI 28.28 kg/m2  EXAM: General appearance: alert and no distress ENT:  bilateral cerumen which is not impacted, legal blindness Neck: no adenopathy, no carotid bruit, no JVD, supple, symmetrical, trachea midline and thyroid not enlarged, symmetric, no tenderness/mass/nodules Lungs: clear to auscultation bilaterally Heart: Irregularly irregular, S1, S2 normal, no murmur, click, rub or gallop Abdomen: soft, non-tender; bowel sounds normal; no masses,  no organomegaly Extremities: extremities normal, atraumatic, no cyanosis or edema Pulses: 2+ and symmetric Skin: Skin color, texture, turgor normal. No rashes or lesions Neurologic: Grossly normal  EKG: Atrial fibrillation at 83, nonspecific ST changes  ASSESSMENT: 1. Coronary artery disease status post four-vessel CABG with an occluded graft to the right coronary in 1997 2. Ischemic cardiomyopathy EF 45% 3. Abdominal aortic aneurysm status post stent grafting 4. Bilateral claudication with reduced ABIs 5. Hypertension 6. Dyslipidemia 7. Paroxysmal  atrial fibrillation-CHADSVASC 4, not currently on warfarin do to legal blindness and high fall risk  PLAN: 1.    Mr. Bangs is now in atrial fibrillation. It's difficult to tell whether or not he symptomatic with this. He does not seem to be aware of it and has not noted any recent significant change in his level of fatigue. His CHADSVASC score is 4, and he was previously on warfarin but was taken off by my predecessor due to some concern about high fall risk and legal blindness. After discussion with his daughter it seems that his legal blindness probably does not play a role in whether he is compliant with medicines in fact he has not had any falls over the past several years. He walks regularly and is able to do physical exercise which does help with his claudication. I'm concerned about his high risk of stroke which is much more than 5%. I would recommend starting low-dose Eliquis 2.5 mg daily as his GFR is around 78 and age is 28. This would be used in combination with low-dose aspirin for the coronary benefit of aspirin as well as its benefit with claudication.  Plan to see him back in 4-6 weeks to see whether or not we need to consider an attempt at cardioversion. I also asked Belenda Cruise, our anticoagulation pharmacist, to meet with him today and discuss safety concerns on the medication. We'll also recheck a metabolic profile and CBC today.  Chrystie Nose, MD, [facility] Attending Cardiologist [facility]  Lisette Abu Hilty 11/14/2014, 10:00 AM

## 2014-11-28 ENCOUNTER — Telehealth: Payer: Self-pay | Admitting: Internal Medicine

## 2014-11-28 NOTE — Telephone Encounter (Signed)
Spoke with daughter and clarified the pneumonia vaccine

## 2014-11-28 NOTE — Telephone Encounter (Signed)
Pt daughter call and would like a call back about a medication he use to take. She said she don't know the name of the medicine said.

## 2014-12-04 ENCOUNTER — Institutional Professional Consult (permissible substitution): Payer: Medicare Other | Admitting: Pulmonary Disease

## 2014-12-05 ENCOUNTER — Institutional Professional Consult (permissible substitution): Payer: Medicare Other | Admitting: Pulmonary Disease

## 2014-12-17 ENCOUNTER — Other Ambulatory Visit: Payer: Self-pay | Admitting: Internal Medicine

## 2014-12-19 ENCOUNTER — Encounter: Payer: Self-pay | Admitting: Internal Medicine

## 2014-12-19 ENCOUNTER — Other Ambulatory Visit: Payer: Self-pay | Admitting: *Deleted

## 2014-12-19 ENCOUNTER — Encounter: Payer: Self-pay | Admitting: Cardiology

## 2014-12-19 ENCOUNTER — Ambulatory Visit (INDEPENDENT_AMBULATORY_CARE_PROVIDER_SITE_OTHER): Payer: Medicare Other | Admitting: Internal Medicine

## 2014-12-19 VITALS — BP 100/62 | HR 96 | Ht 71.5 in | Wt 204.3 lb

## 2014-12-19 DIAGNOSIS — I481 Persistent atrial fibrillation: Secondary | ICD-10-CM | POA: Diagnosis not present

## 2014-12-19 DIAGNOSIS — R5383 Other fatigue: Secondary | ICD-10-CM | POA: Diagnosis not present

## 2014-12-19 DIAGNOSIS — R0600 Dyspnea, unspecified: Secondary | ICD-10-CM

## 2014-12-19 DIAGNOSIS — D689 Coagulation defect, unspecified: Secondary | ICD-10-CM | POA: Diagnosis not present

## 2014-12-19 DIAGNOSIS — Z23 Encounter for immunization: Secondary | ICD-10-CM | POA: Diagnosis not present

## 2014-12-19 DIAGNOSIS — R0609 Other forms of dyspnea: Secondary | ICD-10-CM

## 2014-12-19 DIAGNOSIS — Z79899 Other long term (current) drug therapy: Secondary | ICD-10-CM | POA: Diagnosis not present

## 2014-12-19 DIAGNOSIS — I4819 Other persistent atrial fibrillation: Secondary | ICD-10-CM

## 2014-12-19 DIAGNOSIS — Z951 Presence of aortocoronary bypass graft: Secondary | ICD-10-CM

## 2014-12-19 DIAGNOSIS — I10 Essential (primary) hypertension: Secondary | ICD-10-CM

## 2014-12-19 NOTE — Patient Instructions (Addendum)
HAPPY BIRTHDAY!  Dr. Rennis GoldenHilty has ordered a CARDIOVERSION - this is done at [facility]  You will need to have blood work done 3-5 days prior to your procedure day  Please schedule a cardioversion follow up appointment with Dr. Rennis GoldenHilty

## 2014-12-19 NOTE — Progress Notes (Signed)
OFFICE NOTE  Chief Complaint:  Follow-up A. fib  Primary Care Physician: Rogelia Boga, MD  HPI:  Corey Mcdonald is an 81 year old gentleman with history of coronary bypass in 1997 and had a non-ST-elevation MI in 2010 which was due to the loss of vein graft to the right coronary. EF was 45%. The rest of his anatomy includes a LIMA to the LAD, a saphenous vein graft to an OM and a saphenous vein graft to a diagonal. The saphenous vein graft to the RCA was occluded in 2013. He also has hypertension which has been well controlled and dyslipidemia on Crestor. In addition, he had an abdominal aortic aneurysm and underwent stent grafting. He also has lower extremity claudication with stable ABIs in April 2013 with 0.77 on the left and 0.92 on the right, otherwise no other symptoms. He is being followed by Dr. early for this, who placed his stent graft. He's recently seen by him in June of this year and felt that his claudication is not lifestyle limiting and did not recommend any further treatment at this time. He does have a history of PAF but no recurrence recently but he is not on Coumadin due to high risk of falls and he has significant visual impairment.   Corey Mcdonald Mcdonald today for followup. He reports that he has been doing fairly well. He is having some difficulty with his wife who I guess is aching and may be declining. He's recently had problems with weight loss. He says that his appetite is good and he eats about the same but has lost over 30 pounds. His heart rate is noted to be slower today in blood pressure is borderline low. He also been complaining of night sweats recently.  I the pleasure see Corey Mcdonald in the office today. He is overall doing fairly well although does report some progressive fatigue. He seems to have noted this over the past several years, not necessarily recently. He does have a history of paroxysmal atrial fibrillation in the distant past but  has been in sinus rhythm most of the time when I see him in the office. Today however he is in atrial fibrillation. He's been maintained on aspirin apparently due to visual impairment and high falls and was previously on warfarin. I had a discussion today in the office with his daughter who says that he is very compliant with his medicines as she arranges them for him and I did not feel that visual impairment would be something that she keep him from being on anticoagulation. He also is not having any falls. He in fact walks about a mile to mile and half every day.  Corey Mcdonald today for follow-up. Today's is 89th birthday. Overall he is feeling a little more short of breath. He is in persistent atrial fibrillation. He's now been on eloquence for one month. We discussed options and I'm recommending a cardioversion. He's had a cardioversion in the distant past and maintained sinus rhythm for a long time.  PMHx:  Past Medical History  Diagnosis Date  . ABDOMINAL AORTIC ANEURYSM 09/15/2008  . Atrial fibrillation (HCC) 10/10/2006  . Atrial flutter (HCC) 08/18/2006  . BENIGN PROSTATIC HYPERTROPHY, HX OF 08/18/2006  . COLONIC POLYPS, HX OF 08/18/2006  . HYPERLIPIDEMIA 12/04/2009  . HYPERTENSION 08/18/2006  . HYPOTENSION 09/04/2008  . MACULAR DEGENERATION 08/18/2006  . PEPTIC ULCER DISEASE, HX OF 08/18/2006  . RESTLESS LEGS SYNDROME 12/04/2009  . SLEEP APNEA 08/18/2006  . VERTIGO 07/28/2008  .  WEAKNESS 06/23/2008  . CORONARY ARTERY DISEASE     non-ST segment MI, March 15, 2010  . Anemia January 2012    admitted with acute MI March 15, 2010 and has significant acute blood loss anemia requiring transfusions of two units of packed RBCs.  Status post upper endoscopy March 22, 2010 without High-Risk bleeding lesion.  History of peptic ulcer disease  . Myocardial infarction (HCC) 02/2010  . Leg pain   . AAA (abdominal aortic aneurysm) (HCC)   . NSTEMI (non-ST elevated myocardial infarction) (HCC) 2010    . PAD (peripheral artery disease) (HCC)   . PVD (peripheral vascular disease) (HCC)   . History of Doppler ultrasound 05/2011    h/o claudication, stable ABIs  . History of nuclear stress test 2007    persantine; normal, low risk     Past Surgical History  Procedure Laterality Date  . Inguinal hernia repair      Left inguinal  . Coronary artery bypass graft  12/1995    x5; LIMA to ALD, vein graft to RCA (now occluded),SVG to OM, SVG to diagonal  . Abdominal aortic aneurysm repair      stenting   . Cardiac catheterization  02/2010    r/t NSTEMI; loss of SVG to RCA  . Transthoracic echocardiogram  04/2011    EF 55-60%; mild LVH; grade 1 diastolic dysfunction    FAMHx:  Family History  Problem Relation Age of Onset  . Heart disease Mother   . Heart disease Father   . Heart disease Sister   . Macular degeneration Sister   . Heart disease Sister   . Stroke Father   . Stroke Brother   . Hypertension Sister   . Heart Problems Sister     x2    SOCHx:   reports that he quit smoking about 27 years ago. His smoking use included Cigarettes. He has a 40 pack-year smoking history. He has never used smokeless tobacco. He reports that he does not drink alcohol or use illicit drugs.  ALLERGIES:  Allergies  Allergen Reactions  . Cilostazol Other (See Comments)    Adverse reaction per patient  . Doxazosin Other (See Comments)    INTOLERANCE     ROS: A comprehensive review of systems was negative except for: Constitutional: positive for fatigue, night sweats and weight loss Eyes: positive for visual loss Ears, nose, mouth, throat, and face: positive for hearing loss and ear wax Respiratory: positive for dyspnea on exertion Cardiovascular: positive for claudication  HOME MEDS: Current Outpatient Prescriptions  Medication Sig Dispense Refill  . acetaminophen (TYLENOL) 325 MG tablet Take 650 mg by mouth every 6 (six) hours as needed.    Marland Kitchen. apixaban (ELIQUIS) 2.5 MG TABS tablet Take  1 tablet (2.5 mg total) by mouth 2 (two) times daily. 60 tablet 6  . aspirin 81 MG tablet Take 81 mg by mouth daily.      Marland Kitchen. atorvastatin (LIPITOR) 20 MG tablet Take 1 tablet (20 mg total) by mouth daily. 90 tablet 3  . docusate sodium (COLACE) 100 MG capsule Take 100 mg by mouth daily.     . metoprolol tartrate (LOPRESSOR) 25 MG tablet TAKE ONE TABLET BY MOUTH TWICE DAILY 60 tablet 0  . Multiple Vitamin (MULTIVITAMIN) tablet Take 1 tablet by mouth daily.     . nitroGLYCERIN (NITROSTAT) 0.4 MG SL tablet Place 1 tablet (0.4 mg total) under the tongue every 5 (five) minutes as needed. FOR CHEST PAIN 25 tablet 6  .  pantoprazole (PROTONIX) 40 MG tablet TAKE ONE TABLET BY MOUTH ONCE DAILY 60 tablet 0  . tamsulosin (FLOMAX) 0.4 MG CAPS capsule TAKE ONE CAPSULE BY MOUTH ONCE DAILY 30 capsule 5  . [DISCONTINUED] carbidopa-levodopa (SINEMET CR) 25-100 MG per tablet Take by mouth. 1/2 tablet at bedtime      No current facility-administered medications for this visit.    LABS/IMAGING: No results found for this or any previous visit (from the past 48 hour(s)). No results found.  VITALS: BP 100/62 mmHg  Pulse 96  Ht 5' 11.5" (1.816 m)  Wt 204 lb 4.8 oz (92.67 kg)  BMI 28.10 kg/m2  EXAM: deferred  EKG: Atrial fibrillation at 96  ASSESSMENT: 1. Coronary artery disease status post four-vessel CABG with an occluded graft to the right coronary in 1997 2. Ischemic cardiomyopathy EF 45% 3. Abdominal aortic aneurysm status post stent grafting 4. Bilateral claudication with reduced ABIs 5. Hypertension 6. Dyslipidemia 7. Persistent atrial fibrillation-CHADSVASC 4 - on Eliquis  PLAN: 1.    Corey Mcdonald is now in persistent atrial fibrillation. We discussed a possible cardioversion with him and his daughter in the office today and he is agreeable. The risks and benefits of procedure were covered. We'll plan to ranges in the next couple of weeks. He should remain on the eloquence uninterrupted for  stroke prevention. TEE is not necessary. Finally, he is requesting a flu vaccine today which I think is a good idea and should not be an issue with his atrial fibrillation.  Chrystie Nose, MD, [facility] Attending Cardiologist [facility]  Chrystie Nose 12/19/2014, 10:13 AM

## 2014-12-26 ENCOUNTER — Ambulatory Visit: Payer: Medicare Other | Admitting: *Deleted

## 2014-12-26 LAB — CBC
HCT: 32.1 % — ABNORMAL LOW (ref 39.0–52.0)
HEMOGLOBIN: 10.4 g/dL — AB (ref 13.0–17.0)
MCH: 24.8 pg — AB (ref 26.0–34.0)
MCHC: 32.4 g/dL (ref 30.0–36.0)
MCV: 76.6 fL — AB (ref 78.0–100.0)
MPV: 9.3 fL (ref 8.6–12.4)
PLATELETS: 313 10*3/uL (ref 150–400)
RBC: 4.19 MIL/uL — ABNORMAL LOW (ref 4.22–5.81)
RDW: 16.9 % — ABNORMAL HIGH (ref 11.5–15.5)
WBC: 8.3 10*3/uL (ref 4.0–10.5)

## 2014-12-27 LAB — BASIC METABOLIC PANEL
BUN: 18 mg/dL (ref 7–25)
CALCIUM: 9.3 mg/dL (ref 8.6–10.3)
CO2: 29 mmol/L (ref 20–31)
CREATININE: 1.21 mg/dL — AB (ref 0.70–1.11)
Chloride: 102 mmol/L (ref 98–110)
GLUCOSE: 97 mg/dL (ref 65–99)
Potassium: 5.1 mmol/L (ref 3.5–5.3)
SODIUM: 138 mmol/L (ref 135–146)

## 2014-12-27 LAB — PROTIME-INR
INR: 1.15 (ref <1.50)
Prothrombin Time: 14.8 seconds (ref 11.6–15.2)

## 2014-12-27 LAB — TSH: TSH: 2.15 u[IU]/mL (ref 0.350–4.500)

## 2014-12-27 LAB — APTT: APTT: 33 s (ref 24–37)

## 2015-01-02 ENCOUNTER — Ambulatory Visit (HOSPITAL_COMMUNITY)
Admission: RE | Admit: 2015-01-02 | Discharge: 2015-01-02 | Disposition: A | Discharge status: Home / Self Care | Payer: Medicare Other | Source: Ambulatory Visit | Attending: Cardiology | Admitting: Cardiology

## 2015-01-02 ENCOUNTER — Ambulatory Visit (HOSPITAL_COMMUNITY): Payer: Medicare Other | Admitting: Anesthesiology

## 2015-01-02 ENCOUNTER — Encounter (HOSPITAL_COMMUNITY)
Admission: RE | Disposition: A | Discharge status: Home / Self Care | Payer: Self-pay | Source: Ambulatory Visit | Attending: Cardiology

## 2015-01-02 ENCOUNTER — Encounter (HOSPITAL_COMMUNITY): Payer: Self-pay

## 2015-01-02 DIAGNOSIS — I10 Essential (primary) hypertension: Secondary | ICD-10-CM | POA: Diagnosis not present

## 2015-01-02 DIAGNOSIS — Z79899 Other long term (current) drug therapy: Secondary | ICD-10-CM | POA: Insufficient documentation

## 2015-01-02 DIAGNOSIS — Z7982 Long term (current) use of aspirin: Secondary | ICD-10-CM | POA: Insufficient documentation

## 2015-01-02 DIAGNOSIS — Z7901 Long term (current) use of anticoagulants: Secondary | ICD-10-CM | POA: Insufficient documentation

## 2015-01-02 DIAGNOSIS — I2510 Atherosclerotic heart disease of native coronary artery without angina pectoris: Secondary | ICD-10-CM | POA: Insufficient documentation

## 2015-01-02 DIAGNOSIS — Z95828 Presence of other vascular implants and grafts: Secondary | ICD-10-CM | POA: Insufficient documentation

## 2015-01-02 DIAGNOSIS — I481 Persistent atrial fibrillation: Secondary | ICD-10-CM | POA: Diagnosis not present

## 2015-01-02 DIAGNOSIS — I255 Ischemic cardiomyopathy: Secondary | ICD-10-CM | POA: Insufficient documentation

## 2015-01-02 DIAGNOSIS — I480 Paroxysmal atrial fibrillation: Secondary | ICD-10-CM

## 2015-01-02 DIAGNOSIS — I739 Peripheral vascular disease, unspecified: Secondary | ICD-10-CM | POA: Insufficient documentation

## 2015-01-02 DIAGNOSIS — Z9181 History of falling: Secondary | ICD-10-CM | POA: Insufficient documentation

## 2015-01-02 DIAGNOSIS — Z5309 Procedure and treatment not carried out because of other contraindication: Secondary | ICD-10-CM | POA: Diagnosis not present

## 2015-01-02 DIAGNOSIS — G4730 Sleep apnea, unspecified: Secondary | ICD-10-CM | POA: Insufficient documentation

## 2015-01-02 DIAGNOSIS — E785 Hyperlipidemia, unspecified: Secondary | ICD-10-CM | POA: Insufficient documentation

## 2015-01-02 DIAGNOSIS — I252 Old myocardial infarction: Secondary | ICD-10-CM | POA: Diagnosis not present

## 2015-01-02 DIAGNOSIS — R634 Abnormal weight loss: Secondary | ICD-10-CM | POA: Insufficient documentation

## 2015-01-02 DIAGNOSIS — Z955 Presence of coronary angioplasty implant and graft: Secondary | ICD-10-CM | POA: Insufficient documentation

## 2015-01-02 DIAGNOSIS — Z87891 Personal history of nicotine dependence: Secondary | ICD-10-CM | POA: Insufficient documentation

## 2015-01-02 DIAGNOSIS — I4819 Other persistent atrial fibrillation: Secondary | ICD-10-CM

## 2015-01-02 SURGERY — CANCELLED PROCEDURE
Anesthesia: Monitor Anesthesia Care

## 2015-01-02 MED ORDER — SODIUM CHLORIDE 0.9 % IV SOLN: INTRAVENOUS | Status: DC

## 2015-01-02 MED ORDER — APIXABAN 5 MG PO TABS: 5.0000 mg | ORAL_TABLET | Freq: Two times a day (BID) | ORAL | Status: DC

## 2015-01-02 NOTE — Progress Notes (Signed)
Patient presented today for DCCV.  On review of medications, it was noted that the patient is on Eliquis 2.5mg  BID.  The dosing criteria are 2.5mg  BID if 2/3 of the following are present:  Age >80, weight <60kg and Creatinine.  His Creatinine is 1.2 and last weight 92kg.  I have explained this to his daughter and have cancelled DCCV since he has not been adequately anticoagulated.  Will increase Eliquis to 5mg  BID and followup with Dr. Rennis GoldenHilty in 3 weeks. This was discussed with Dr. Rennis GoldenHilty.

## 2015-01-02 NOTE — Interval H&P Note (Signed)
History and Physical Interval Note:  01/02/2015 8:15 AM  Corey Mcdonald  has presented today for surgery, with the diagnosis of A FIB  The various methods of treatment have been discussed with the patient and family. After consideration of risks, benefits and other options for treatment, the patient has consented to  Procedure(s): CARDIOVERSION (N/A) as a surgical intervention .  The patient's history has been reviewed, patient examined, no change in status, stable for surgery.  I have reviewed the patient's chart and labs.  Questions were answered to the patient's satisfaction.     Davette Nugent R

## 2015-01-02 NOTE — H&P (View-Only) (Signed)
OFFICE NOTE  Chief Complaint:  Follow-up A. fib  Primary Care Physician: Rogelia Boga, MD  HPI:  Corey Mcdonald is an 81 year old gentleman with history of coronary bypass in 1997 and had a non-ST-elevation MI in 2010 which was due to the loss of vein graft to the right coronary. EF was 45%. The rest of his anatomy includes a LIMA to the LAD, a saphenous vein graft to an OM and a saphenous vein graft to a diagonal. The saphenous vein graft to the RCA was occluded in 2013. He also has hypertension which has been well controlled and dyslipidemia on Crestor. In addition, he had an abdominal aortic aneurysm and underwent stent grafting. He also has lower extremity claudication with stable ABIs in April 2013 with 0.77 on the left and 0.92 on the right, otherwise no other symptoms. He is being followed by Dr. early for this, who placed his stent graft. He's recently seen by him in June of this year and felt that his claudication is not lifestyle limiting and did not recommend any further treatment at this time. He does have a history of PAF but no recurrence recently but he is not on Coumadin due to high risk of falls and he has significant visual impairment.   Corey Mcdonald returns today for followup. He reports that he has been doing fairly well. He is having some difficulty with his wife who I guess is aching and may be declining. He's recently had problems with weight loss. He says that his appetite is good and he eats about the same but has lost over 30 pounds. His heart rate is noted to be slower today in blood pressure is borderline low. He also been complaining of night sweats recently.  I the pleasure see Corey Mcdonald back in the office today. He is overall doing fairly well although does report some progressive fatigue. He seems to have noted this over the past several years, not necessarily recently. He does have a history of paroxysmal atrial fibrillation in the distant past but  has been in sinus rhythm most of the time when I see him in the office. Today however he is in atrial fibrillation. He's been maintained on aspirin apparently due to visual impairment and high falls and was previously on warfarin. I had a discussion today in the office with his daughter who says that he is very compliant with his medicines as she arranges them for him and I did not feel that visual impairment would be something that she keep him from being on anticoagulation. He also is not having any falls. He in fact walks about a mile to mile and half every day.  Corey Mcdonald returns today for follow-up. Today's is 89th birthday. Overall he is feeling a little more short of breath. He is in persistent atrial fibrillation. He's now been on eloquence for one month. We discussed options and I'm recommending a cardioversion. He's had a cardioversion in the distant past and maintained sinus rhythm for a long time.  PMHx:  Past Medical History  Diagnosis Date  . ABDOMINAL AORTIC ANEURYSM 09/15/2008  . Atrial fibrillation (HCC) 10/10/2006  . Atrial flutter (HCC) 08/18/2006  . BENIGN PROSTATIC HYPERTROPHY, HX OF 08/18/2006  . COLONIC POLYPS, HX OF 08/18/2006  . HYPERLIPIDEMIA 12/04/2009  . HYPERTENSION 08/18/2006  . HYPOTENSION 09/04/2008  . MACULAR DEGENERATION 08/18/2006  . PEPTIC ULCER DISEASE, HX OF 08/18/2006  . RESTLESS LEGS SYNDROME 12/04/2009  . SLEEP APNEA 08/18/2006  . VERTIGO 07/28/2008  .  WEAKNESS 06/23/2008  . CORONARY ARTERY DISEASE     non-ST segment MI, March 15, 2010  . Anemia January 2012    admitted with acute MI March 15, 2010 and has significant acute blood loss anemia requiring transfusions of two units of packed RBCs.  Status post upper endoscopy March 22, 2010 without High-Risk bleeding lesion.  History of peptic ulcer disease  . Myocardial infarction (HCC) 02/2010  . Leg pain   . AAA (abdominal aortic aneurysm) (HCC)   . NSTEMI (non-ST elevated myocardial infarction) (HCC) 2010    . PAD (peripheral artery disease) (HCC)   . PVD (peripheral vascular disease) (HCC)   . History of Doppler ultrasound 05/2011    h/o claudication, stable ABIs  . History of nuclear stress test 2007    persantine; normal, low risk     Past Surgical History  Procedure Laterality Date  . Inguinal hernia repair      Left inguinal  . Coronary artery bypass graft  12/1995    x5; LIMA to ALD, vein graft to RCA (now occluded),SVG to OM, SVG to diagonal  . Abdominal aortic aneurysm repair      stenting   . Cardiac catheterization  02/2010    r/t NSTEMI; loss of SVG to RCA  . Transthoracic echocardiogram  04/2011    EF 55-60%; mild LVH; grade 1 diastolic dysfunction    FAMHx:  Family History  Problem Relation Age of Onset  . Heart disease Mother   . Heart disease Father   . Heart disease Sister   . Macular degeneration Sister   . Heart disease Sister   . Stroke Father   . Stroke Brother   . Hypertension Sister   . Heart Problems Sister     x2    SOCHx:   reports that he quit smoking about 27 years ago. His smoking use included Cigarettes. He has a 40 pack-year smoking history. He has never used smokeless tobacco. He reports that he does not drink alcohol or use illicit drugs.  ALLERGIES:  Allergies  Allergen Reactions  . Cilostazol Other (See Comments)    Adverse reaction per patient  . Doxazosin Other (See Comments)    INTOLERANCE     ROS: A comprehensive review of systems was negative except for: Constitutional: positive for fatigue, night sweats and weight loss Eyes: positive for visual loss Ears, nose, mouth, throat, and face: positive for hearing loss and ear wax Respiratory: positive for dyspnea on exertion Cardiovascular: positive for claudication  HOME MEDS: Current Outpatient Prescriptions  Medication Sig Dispense Refill  . acetaminophen (TYLENOL) 325 MG tablet Take 650 mg by mouth every 6 (six) hours as needed.    Marland Kitchen. apixaban (ELIQUIS) 2.5 MG TABS tablet Take  1 tablet (2.5 mg total) by mouth 2 (two) times daily. 60 tablet 6  . aspirin 81 MG tablet Take 81 mg by mouth daily.      Marland Kitchen. atorvastatin (LIPITOR) 20 MG tablet Take 1 tablet (20 mg total) by mouth daily. 90 tablet 3  . docusate sodium (COLACE) 100 MG capsule Take 100 mg by mouth daily.     . metoprolol tartrate (LOPRESSOR) 25 MG tablet TAKE ONE TABLET BY MOUTH TWICE DAILY 60 tablet 0  . Multiple Vitamin (MULTIVITAMIN) tablet Take 1 tablet by mouth daily.     . nitroGLYCERIN (NITROSTAT) 0.4 MG SL tablet Place 1 tablet (0.4 mg total) under the tongue every 5 (five) minutes as needed. FOR CHEST PAIN 25 tablet 6  .  pantoprazole (PROTONIX) 40 MG tablet TAKE ONE TABLET BY MOUTH ONCE DAILY 60 tablet 0  . tamsulosin (FLOMAX) 0.4 MG CAPS capsule TAKE ONE CAPSULE BY MOUTH ONCE DAILY 30 capsule 5  . [DISCONTINUED] carbidopa-levodopa (SINEMET CR) 25-100 MG per tablet Take by mouth. 1/2 tablet at bedtime      No current facility-administered medications for this visit.    LABS/IMAGING: No results found for this or any previous visit (from the past 48 hour(s)). No results found.  VITALS: BP 100/62 mmHg  Pulse 96  Ht 5' 11.5" (1.816 m)  Wt 204 lb 4.8 oz (92.67 kg)  BMI 28.10 kg/m2  EXAM: deferred  EKG: Atrial fibrillation at 96  ASSESSMENT: 1. Coronary artery disease status post four-vessel CABG with an occluded graft to the right coronary in 1997 2. Ischemic cardiomyopathy EF 45% 3. Abdominal aortic aneurysm status post stent grafting 4. Bilateral claudication with reduced ABIs 5. Hypertension 6. Dyslipidemia 7. Persistent atrial fibrillation-CHADSVASC 4 - on Eliquis  PLAN: 1.    Corey Mcdonald is now in persistent atrial fibrillation. We discussed a possible cardioversion with him and his daughter in the office today and he is agreeable. The risks and benefits of procedure were covered. We'll plan to ranges in the next couple of weeks. He should remain on the eloquence uninterrupted for  stroke prevention. TEE is not necessary. Finally, he is requesting a flu vaccine today which I think is a good idea and should not be an issue with his atrial fibrillation.  Timira Bieda C. Adante Courington, MD, FACC Attending Cardiologist [facility]  Akosua Constantine C Jen Eppinger 12/19/2014, 10:13 AM  

## 2015-01-02 NOTE — Anesthesia Preprocedure Evaluation (Addendum)
Anesthesia Evaluation  Patient identified by MRN, date of birth, ID band Patient awake    Reviewed: Allergy & Precautions, NPO status , Patient's Chart, lab work & pertinent test results  Airway Mallampati: II  TM Distance: >3 FB Neck ROM: Full    Dental no notable dental hx.    Pulmonary neg pulmonary ROS, shortness of breath, sleep apnea , former smoker,    Pulmonary exam normal breath sounds clear to auscultation       Cardiovascular hypertension, Pt. on medications and Pt. on home beta blockers + CAD, + Past MI, + CABG, + Peripheral Vascular Disease and + DOE  + dysrhythmias Atrial Fibrillation  Rhythm:Irregular Rate:Normal     Neuro/Psych negative neurological ROS  negative psych ROS   GI/Hepatic Neg liver ROS, PUD,   Endo/Other  negative endocrine ROS  Renal/GU negative Renal ROS  negative genitourinary   Musculoskeletal negative musculoskeletal ROS (+)   Abdominal   Peds negative pediatric ROS (+)  Hematology negative hematology ROS (+)   Anesthesia Other Findings   Reproductive/Obstetrics negative OB ROS                           Anesthesia Physical Anesthesia Plan  ASA: III  Anesthesia Plan: MAC   Post-op Pain Management:    Induction: Intravenous  Airway Management Planned: Mask  Additional Equipment:   Intra-op Plan:   Post-operative Plan: Extubation in OR  Informed Consent: I have reviewed the patients History and Physical, chart, labs and discussed the procedure including the risks, benefits and alternatives for the proposed anesthesia with the patient or authorized representative who has indicated his/her understanding and acceptance.   Dental advisory given  Plan Discussed with: CRNA and Surgeon  Anesthesia Plan Comments:         Anesthesia Quick Evaluation

## 2015-01-02 NOTE — Progress Notes (Signed)
Pt arrived to endo and admitted.  Dr. Mayford Knifeurner in to talk with patient/daughter.  Case cancelled secondary to incorrect dosage of Eliquis.  Pt and daughter verb und.

## 2015-01-16 ENCOUNTER — Other Ambulatory Visit: Payer: Self-pay | Admitting: Internal Medicine

## 2015-01-20 ENCOUNTER — Other Ambulatory Visit: Payer: Self-pay

## 2015-01-20 ENCOUNTER — Ambulatory Visit (INDEPENDENT_AMBULATORY_CARE_PROVIDER_SITE_OTHER): Payer: Medicare Other | Admitting: Internal Medicine

## 2015-01-20 ENCOUNTER — Encounter: Payer: Self-pay | Admitting: Internal Medicine

## 2015-01-20 VITALS — BP 150/80 | HR 94 | Wt 207.1 lb

## 2015-01-20 DIAGNOSIS — R0609 Other forms of dyspnea: Secondary | ICD-10-CM | POA: Diagnosis not present

## 2015-01-20 DIAGNOSIS — Z01812 Encounter for preprocedural laboratory examination: Secondary | ICD-10-CM

## 2015-01-20 DIAGNOSIS — Z951 Presence of aortocoronary bypass graft: Secondary | ICD-10-CM

## 2015-01-20 DIAGNOSIS — R0600 Dyspnea, unspecified: Secondary | ICD-10-CM

## 2015-01-20 DIAGNOSIS — I4891 Unspecified atrial fibrillation: Secondary | ICD-10-CM

## 2015-01-20 MED ORDER — NITROGLYCERIN 0.4 MG SL SUBL: 0.4000 mg | SUBLINGUAL_TABLET | SUBLINGUAL | Status: DC | PRN

## 2015-01-20 NOTE — Progress Notes (Signed)
OFFICE NOTE  Chief Complaint:  Follow-up A. fib  Primary Care Physician: Rogelia Boga, MD  HPI:  Corey Mcdonald is an 81 year old gentleman with history of coronary bypass in 1997 and had a non-ST-elevation MI in 2010 which was due to the loss of vein graft to the right coronary. EF was 45%. The rest of his anatomy includes a LIMA to the LAD, a saphenous vein graft to an OM and a saphenous vein graft to a diagonal. The saphenous vein graft to the RCA was occluded in 2013. He also has hypertension which has been well controlled and dyslipidemia on Crestor. In addition, he had an abdominal aortic aneurysm and underwent stent grafting. He also has lower extremity claudication with stable ABIs in April 2013 with 0.77 on the left and 0.92 on the right, otherwise no other symptoms. He is being followed by Dr. early for this, who placed his stent graft. He's recently seen by him in June of this year and felt that his claudication is not lifestyle limiting and did not recommend any further treatment at this time. He does have a history of PAF but no recurrence recently but he is not on Coumadin due to high risk of falls and he has significant visual impairment.   Corey Mcdonald returns today for followup. He reports that he has been doing fairly well. He is having some difficulty with his wife who I guess is aching and may be declining. He's recently had problems with weight loss. He says that his appetite is good and he eats about the same but has lost over 30 pounds. His heart rate is noted to be slower today in blood pressure is borderline low. He also been complaining of night sweats recently.  I the pleasure see Corey Mcdonald back in the office today. He is overall doing fairly well although does report some progressive fatigue. He seems to have noted this over the past several years, not necessarily recently. He does have a history of paroxysmal atrial fibrillation in the distant past but  has been in sinus rhythm most of the time when I see him in the office. Today however he is in atrial fibrillation. He's been maintained on aspirin apparently due to visual impairment and high falls and was previously on warfarin. I had a discussion today in the office with his daughter who says that he is very compliant with his medicines as she arranges them for him and I did not feel that visual impairment would be something that she keep him from being on anticoagulation. He also is not having any falls. He in fact walks about a mile to mile and half every day.  Corey Mcdonald returns today for follow-up.  He remains in persistent atrial fibrillation. He was scheduled to have a cardioversion however it was correctly noted that he was under dosed and on 2.5 mg twice a day of Eliquis,  However should've been on the 5 mg twice daily dosing. Fortunately he's had no problems with this and was correctly switched to the 5 mg twice daily dose. This was as of November 11. We therefore will have to postpone his cardioversion until after December 11.  PMHx:  Past Medical History  Diagnosis Date  . ABDOMINAL AORTIC ANEURYSM 09/15/2008  . Atrial fibrillation (HCC) 10/10/2006  . Atrial flutter (HCC) 08/18/2006  . BENIGN PROSTATIC HYPERTROPHY, HX OF 08/18/2006  . COLONIC POLYPS, HX OF 08/18/2006  . HYPERLIPIDEMIA 12/04/2009  . HYPERTENSION 08/18/2006  . HYPOTENSION 09/04/2008  .  MACULAR DEGENERATION 08/18/2006  . PEPTIC ULCER DISEASE, HX OF 08/18/2006  . RESTLESS LEGS SYNDROME 12/04/2009  . SLEEP APNEA 08/18/2006  . VERTIGO 07/28/2008  . WEAKNESS 06/23/2008  . CORONARY ARTERY DISEASE     non-ST segment MI, March 15, 2010  . Anemia January 2012    admitted with acute MI March 15, 2010 and has significant acute blood loss anemia requiring transfusions of two units of packed RBCs.  Status post upper endoscopy March 22, 2010 without High-Risk bleeding lesion.  History of peptic ulcer disease  . Myocardial infarction  (HCC) 02/2010  . Leg pain   . AAA (abdominal aortic aneurysm) (HCC)   . NSTEMI (non-ST elevated myocardial infarction) (HCC) 2010  . PAD (peripheral artery disease) (HCC)   . PVD (peripheral vascular disease) (HCC)   . History of Doppler ultrasound 05/2011    h/o claudication, stable ABIs  . History of nuclear stress test 2007    persantine; normal, low risk     Past Surgical History  Procedure Laterality Date  . Inguinal hernia repair      Left inguinal  . Coronary artery bypass graft  12/1995    x5; LIMA to ALD, vein graft to RCA (now occluded),SVG to OM, SVG to diagonal  . Abdominal aortic aneurysm repair      stenting   . Cardiac catheterization  02/2010    r/t NSTEMI; loss of SVG to RCA  . Transthoracic echocardiogram  04/2011    EF 55-60%; mild LVH; grade 1 diastolic dysfunction    FAMHx:  Family History  Problem Relation Age of Onset  . Heart disease Mother   . Heart disease Father   . Heart disease Sister   . Macular degeneration Sister   . Heart disease Sister   . Stroke Father   . Stroke Brother   . Hypertension Sister   . Heart Problems Sister     x2    SOCHx:   reports that he quit smoking about 27 years ago. His smoking use included Cigarettes. He has a 40 pack-year smoking history. He has never used smokeless tobacco. He reports that he does not drink alcohol or use illicit drugs.  ALLERGIES:  Allergies  Allergen Reactions  . Cilostazol Other (See Comments)    Adverse reaction per patient  . Doxazosin Other (See Comments)    INTOLERANCE     ROS: A comprehensive review of systems was negative except for: Constitutional: positive for fatigue, night sweats and weight loss Eyes: positive for visual loss Ears, nose, mouth, throat, and face: positive for hearing loss and ear wax Respiratory: positive for dyspnea on exertion Cardiovascular: positive for claudication  HOME MEDS: Current Outpatient Prescriptions  Medication Sig Dispense Refill  .  acetaminophen (TYLENOL) 325 MG tablet Take 650 mg by mouth every 6 (six) hours as needed for moderate pain.     Marland Kitchen. apixaban (ELIQUIS) 5 MG TABS tablet Take 1 tablet (5 mg total) by mouth 2 (two) times daily. 60 tablet 3  . atorvastatin (LIPITOR) 20 MG tablet Take 1 tablet (20 mg total) by mouth daily. 90 tablet 3  . docusate sodium (COLACE) 100 MG capsule Take 100 mg by mouth daily.     . metoprolol tartrate (LOPRESSOR) 25 MG tablet TAKE ONE TABLET BY MOUTH TWICE DAILY. 60 tablet 1  . Multiple Vitamin (MULTIVITAMIN) tablet Take 1 tablet by mouth daily.     . nitroGLYCERIN (NITROSTAT) 0.4 MG SL tablet Place 1 tablet (0.4 mg total) under the  tongue every 5 (five) minutes as needed. FOR CHEST PAIN 25 tablet 6  . pantoprazole (PROTONIX) 40 MG tablet TAKE ONE TABLET BY MOUTH ONCE DAILY 60 tablet 0  . tamsulosin (FLOMAX) 0.4 MG CAPS capsule TAKE ONE CAPSULE BY MOUTH ONCE DAILY 30 capsule 5  . [DISCONTINUED] carbidopa-levodopa (SINEMET CR) 25-100 MG per tablet Take by mouth. 1/2 tablet at bedtime      No current facility-administered medications for this visit.    LABS/IMAGING: No results found for this or any previous visit (from the past 48 hour(s)). No results found.  VITALS: BP 150/80 mmHg  Pulse 94  Wt 207 lb 1.6 oz (93.94 kg)  EXAM: deferred  EKG: Atrial fibrillation at 94  ASSESSMENT: 1. Coronary artery disease status post four-vessel CABG with an occluded graft to the right coronary in 1997 2. Ischemic cardiomyopathy EF 45% 3. Abdominal aortic aneurysm status post stent grafting 4. Bilateral claudication with reduced ABIs 5. Hypertension 6. Dyslipidemia 7. Persistent atrial fibrillation-CHADSVASC 4 - on Eliquis  PLAN: 1.    Corey Mcdonald is now in persistent atrial fibrillation. We discussed a possible cardioversion with him and his son in the office today and he is agreeable. The risks and benefits of procedure were covered. We'll plan to ranges in the next couple of weeks. He  should remain on the Eliquis at 5 mg BID uninterrupted for stroke prevention. TEE is not necessary.   Chrystie Nose, MD, [facility] Attending Cardiologist [facility]  Chrystie Nose 01/20/2015, 5:42 PM

## 2015-01-20 NOTE — Patient Instructions (Signed)
Your physician has recommended that you have a Cardioversion (DCCV) on December 20th, with Dr Rennis GoldenHilty. Electrical Cardioversion uses a jolt of electricity to your heart either through paddles or wired patches attached to your chest. This is a controlled, usually prescheduled, procedure. Defibrillation is done under light anesthesia in the hospital, and you usually go home the day of the procedure. This is done to get your heart back into a normal rhythm. You are not awake for the procedure. Please see the instruction sheet given to you today.  Your physician recommends that you return for lab work no more than 7 days prior to the procedure.  Dr Rennis GoldenHilty recommends that you schedule a follow-up appointment in early January 2017.  If you need a refill on your cardiac medications before your next appointment, please call your pharmacy.

## 2015-01-21 ENCOUNTER — Telehealth: Payer: Self-pay | Admitting: Pulmonary Disease

## 2015-01-21 ENCOUNTER — Ambulatory Visit (INDEPENDENT_AMBULATORY_CARE_PROVIDER_SITE_OTHER): Payer: Medicare Other | Admitting: Pulmonary Disease

## 2015-01-21 ENCOUNTER — Encounter: Payer: Self-pay | Admitting: Pulmonary Disease

## 2015-01-21 VITALS — BP 130/80 | HR 83 | Ht 71.0 in | Wt 206.0 lb

## 2015-01-21 DIAGNOSIS — R600 Localized edema: Secondary | ICD-10-CM

## 2015-01-21 DIAGNOSIS — G4733 Obstructive sleep apnea (adult) (pediatric): Secondary | ICD-10-CM

## 2015-01-21 DIAGNOSIS — Z9989 Dependence on other enabling machines and devices: Secondary | ICD-10-CM

## 2015-01-21 NOTE — Telephone Encounter (Signed)
Called spoke with pt son. He wanted to know if he should bring in pt CPAP to appt. I advised yes along with the plug in cord to see if download can be done. Nothing further needed

## 2015-01-21 NOTE — Progress Notes (Signed)
Subjective:    Patient ID: Corey Mcdonald, male    DOB: Oct 19, 1923, 81 y.o.   MRN: [id number]  HPI  Chief Complaint  Patient presents with  . Sleep Consult    Sleep Study 10/02/14; Uses CPAP every night and afternoons during naps. Does not want to use [employer].  Wants new CPAP with no water. Epworth Score: 82    81 year old man presents for management of OSA. He is accompanied by his son. He has paroxysmal atrial fibrillation and cardioversion is planned in December once he is fully anticoagulated for a good duration. He was diagnosed more than 10 years ago. Repeat home sleep study in 09/2014 showed severe OSA with AHI 43/hour with lowest desaturation of 81%. He has been maintained on CPAP therapy for many years with a small nasal mask. He had some problems with DME-[employer] and has not changed supplies in 3 years. Due to his blindness, his son requests a new machine it should be easier for him to turn on and off. He has gotten used to his current mass and would like to persist with this. CPAP download shows excellent compliance-at least 9 hours/day. He even uses this during naps.  Epworth sleepiness score is 13. Bedtime is around 9 PM, sleep latency is about 30 minutes, reports 4-5 nocturnal awakenings, with nocturia and is out of bed by 8:30 AM feeling refreshed with occasional dryness of mouth. He does not use a humidifier. He reports bipedal edema   Past Medical History  Diagnosis Date  . ABDOMINAL AORTIC ANEURYSM 09/15/2008  . Atrial fibrillation (HCC) 10/10/2006  . Atrial flutter (HCC) 08/18/2006  . BENIGN PROSTATIC HYPERTROPHY, HX OF 08/18/2006  . COLONIC POLYPS, HX OF 08/18/2006  . HYPERLIPIDEMIA 12/04/2009  . HYPERTENSION 08/18/2006  . HYPOTENSION 09/04/2008  . MACULAR DEGENERATION 08/18/2006  . PEPTIC ULCER DISEASE, HX OF 08/18/2006  . RESTLESS LEGS SYNDROME 12/04/2009  . SLEEP APNEA 08/18/2006  . VERTIGO 07/28/2008  . WEAKNESS 06/23/2008  . CORONARY ARTERY DISEASE     non-ST segment MI,  March 15, 2010  . Anemia January 2012    admitted with acute MI March 15, 2010 and has significant acute blood loss anemia requiring transfusions of two units of packed RBCs.  Status post upper endoscopy March 22, 2010 without High-Risk bleeding lesion.  History of peptic ulcer disease  . Myocardial infarction (HCC) 02/2010  . Leg pain   . AAA (abdominal aortic aneurysm) (HCC)   . NSTEMI (non-ST elevated myocardial infarction) (HCC) 2010  . PAD (peripheral artery disease) (HCC)   . PVD (peripheral vascular disease) (HCC)   . History of Doppler ultrasound 05/2011    h/o claudication, stable ABIs  . History of nuclear stress test 2007    persantine; normal, low risk     Past Surgical History  Procedure Laterality Date  . Inguinal hernia repair      Left inguinal  . Coronary artery bypass graft  12/1995    x5; LIMA to ALD, vein graft to RCA (now occluded),SVG to OM, SVG to diagonal  . Abdominal aortic aneurysm repair      stenting   . Cardiac catheterization  02/2010    r/t NSTEMI; loss of SVG to RCA  . Transthoracic echocardiogram  04/2011    EF 55-60%; mild LVH; grade 1 diastolic dysfunction     Allergies  Allergen Reactions  . Cilostazol Other (See Comments)    Adverse reaction per patient  . Doxazosin Other (See Comments)    INTOLERANCE  Social History   Social History  . Marital Status: Married    Spouse Name: N/A  . Number of Children: 2  . Years of Education: N/A   Occupational History  . Not on file.   Social History Main Topics  . Smoking status: Former Smoker -- 1.00 packs/day for 40 years    Types: Cigarettes    Quit date: 02/22/1987  . Smokeless tobacco: Never Used  . Alcohol Use: No  . Drug Use: No  . Sexual Activity: Not on file   Other Topics Concern  . Not on file   Social History Narrative     Review of Systems Constitutional: negative for anorexia, fevers and sweats  Eyes: negative for irritation, redness and visual disturbance    Ears, nose, mouth, throat, and face: negative for earaches, epistaxis, nasal congestion and sore throat  Respiratory: negative for cough, dyspnea on exertion, sputum and wheezing  Cardiovascular: negative for chest pain, dyspnea, lower extremity edema, orthopnea, palpitations and syncope  Gastrointestinal: negative for abdominal pain, constipation, diarrhea, melena, nausea and vomiting  Genitourinary:negative for dysuria, frequency and hematuria  Hematologic/lymphatic: negative for bleeding, easy bruising and lymphadenopathy  Musculoskeletal:negative for arthralgias, muscle weakness and stiff joints  Neurological: negative for coordination problems, gait problems, headaches and weakness  Endocrine: negative for diabetic symptoms including polydipsia, polyuria and weight loss     Objective:   Physical Exam  Gen. Pleasant, well-nourished, in no distress, normal affect ENT - no lesions, no post nasal drip, legally blind Neck: No JVD, no thyromegaly, no carotid bruits Lungs: no use of accessory muscles, no dullness to percussion, clear without rales or rhonchi  Cardiovascular: Rhythm regular, heart sounds  normal, no murmurs or gallops, 2+ peripheral edema Abdomen: soft and non-tender, no hepatosplenomegaly, BS normal. Musculoskeletal: No deformities, no cyanosis or clubbing Neuro:  alert, non focal       Assessment & Plan:

## 2015-01-21 NOTE — Assessment & Plan Note (Signed)
His events seem to be well controlled on CPAP 8 cm He has excellent compliance. We will provide him with new supplies and hook him up with a new DME company based on son's request. He will certainly need a new mask, new hose-I have asked him to ensure changing filters every 3 months this was demonstrated to her son. We will also try to provide him with a new CPAP machine. We discussed issues with his blindness and use of his machine. As such we will keep it simple for him-he may not need a humidifier

## 2015-01-21 NOTE — Patient Instructions (Signed)
CPAP supplies will be renewed -new nasal mask, new DME We will try & get you a new CPAP machine

## 2015-01-21 NOTE — Assessment & Plan Note (Signed)
This may be related to congestive heart failure, given recent episode of atrial fibrillation. I do not see a recent echo. He is already scheduled for a cardioversion. He already has nocturia and son is worried about putting him on a diuretic. I will defer to his cardiologist

## 2015-01-23 ENCOUNTER — Other Ambulatory Visit: Payer: Self-pay

## 2015-01-23 DIAGNOSIS — I4891 Unspecified atrial fibrillation: Secondary | ICD-10-CM

## 2015-01-27 ENCOUNTER — Inpatient Hospital Stay (HOSPITAL_COMMUNITY)
Admission: EM | Admit: 2015-01-27 | Discharge: 2015-01-29 | DRG: 379 | Disposition: A | Discharge status: Home / Self Care | Payer: Medicare Other | Attending: Internal Medicine | Admitting: Internal Medicine

## 2015-01-27 ENCOUNTER — Encounter (HOSPITAL_COMMUNITY): Payer: Self-pay | Admitting: *Deleted

## 2015-01-27 ENCOUNTER — Encounter: Payer: Self-pay | Admitting: Internal Medicine

## 2015-01-27 ENCOUNTER — Ambulatory Visit (INDEPENDENT_AMBULATORY_CARE_PROVIDER_SITE_OTHER): Payer: Medicare Other | Admitting: Internal Medicine

## 2015-01-27 VITALS — BP 150/80 | HR 67 | Temp 98.4°F | Resp 20 | Ht 71.0 in | Wt 204.0 lb

## 2015-01-27 DIAGNOSIS — H3530 Unspecified macular degeneration: Secondary | ICD-10-CM | POA: Diagnosis present

## 2015-01-27 DIAGNOSIS — D500 Iron deficiency anemia secondary to blood loss (chronic): Secondary | ICD-10-CM | POA: Diagnosis present

## 2015-01-27 DIAGNOSIS — Z8711 Personal history of peptic ulcer disease: Secondary | ICD-10-CM

## 2015-01-27 DIAGNOSIS — I10 Essential (primary) hypertension: Secondary | ICD-10-CM

## 2015-01-27 DIAGNOSIS — Z9989 Dependence on other enabling machines and devices: Secondary | ICD-10-CM | POA: Diagnosis present

## 2015-01-27 DIAGNOSIS — K921 Melena: Secondary | ICD-10-CM | POA: Diagnosis present

## 2015-01-27 DIAGNOSIS — Z86010 Personal history of colonic polyps: Secondary | ICD-10-CM

## 2015-01-27 DIAGNOSIS — Z7901 Long term (current) use of anticoagulants: Secondary | ICD-10-CM | POA: Diagnosis not present

## 2015-01-27 DIAGNOSIS — N183 Chronic kidney disease, stage 3 (moderate): Secondary | ICD-10-CM | POA: Diagnosis present

## 2015-01-27 DIAGNOSIS — K449 Diaphragmatic hernia without obstruction or gangrene: Secondary | ICD-10-CM | POA: Diagnosis present

## 2015-01-27 DIAGNOSIS — H548 Legal blindness, as defined in USA: Secondary | ICD-10-CM | POA: Diagnosis present

## 2015-01-27 DIAGNOSIS — Z8679 Personal history of other diseases of the circulatory system: Secondary | ICD-10-CM | POA: Diagnosis not present

## 2015-01-27 DIAGNOSIS — G4733 Obstructive sleep apnea (adult) (pediatric): Secondary | ICD-10-CM | POA: Diagnosis not present

## 2015-01-27 DIAGNOSIS — Z951 Presence of aortocoronary bypass graft: Secondary | ICD-10-CM

## 2015-01-27 DIAGNOSIS — Z66 Do not resuscitate: Secondary | ICD-10-CM | POA: Diagnosis present

## 2015-01-27 DIAGNOSIS — I252 Old myocardial infarction: Secondary | ICD-10-CM

## 2015-01-27 DIAGNOSIS — N189 Chronic kidney disease, unspecified: Secondary | ICD-10-CM | POA: Diagnosis present

## 2015-01-27 DIAGNOSIS — Z87891 Personal history of nicotine dependence: Secondary | ICD-10-CM | POA: Diagnosis not present

## 2015-01-27 DIAGNOSIS — I482 Chronic atrial fibrillation, unspecified: Secondary | ICD-10-CM

## 2015-01-27 DIAGNOSIS — K5900 Constipation, unspecified: Secondary | ICD-10-CM | POA: Diagnosis present

## 2015-01-27 DIAGNOSIS — D62 Acute posthemorrhagic anemia: Secondary | ICD-10-CM | POA: Diagnosis not present

## 2015-01-27 DIAGNOSIS — I739 Peripheral vascular disease, unspecified: Secondary | ICD-10-CM | POA: Diagnosis present

## 2015-01-27 DIAGNOSIS — Z888 Allergy status to other drugs, medicaments and biological substances status: Secondary | ICD-10-CM

## 2015-01-27 DIAGNOSIS — I4891 Unspecified atrial fibrillation: Secondary | ICD-10-CM | POA: Diagnosis present

## 2015-01-27 DIAGNOSIS — I714 Abdominal aortic aneurysm, without rupture: Secondary | ICD-10-CM | POA: Diagnosis present

## 2015-01-27 DIAGNOSIS — I129 Hypertensive chronic kidney disease with stage 1 through stage 4 chronic kidney disease, or unspecified chronic kidney disease: Secondary | ICD-10-CM | POA: Diagnosis present

## 2015-01-27 DIAGNOSIS — K922 Gastrointestinal hemorrhage, unspecified: Secondary | ICD-10-CM | POA: Diagnosis not present

## 2015-01-27 DIAGNOSIS — E785 Hyperlipidemia, unspecified: Secondary | ICD-10-CM | POA: Diagnosis present

## 2015-01-27 DIAGNOSIS — I2510 Atherosclerotic heart disease of native coronary artery without angina pectoris: Secondary | ICD-10-CM | POA: Diagnosis present

## 2015-01-27 DIAGNOSIS — G4730 Sleep apnea, unspecified: Secondary | ICD-10-CM | POA: Diagnosis present

## 2015-01-27 LAB — COMPREHENSIVE METABOLIC PANEL
ALBUMIN: 3.6 g/dL (ref 3.5–5.0)
ALT: 23 U/L (ref 17–63)
ANION GAP: 10 (ref 5–15)
AST: 23 U/L (ref 15–41)
Alkaline Phosphatase: 75 U/L (ref 38–126)
BILIRUBIN TOTAL: 0.5 mg/dL (ref 0.3–1.2)
BUN: 22 mg/dL — AB (ref 6–20)
CHLORIDE: 103 mmol/L (ref 101–111)
CO2: 24 mmol/L (ref 22–32)
Calcium: 9.4 mg/dL (ref 8.9–10.3)
Creatinine, Ser: 1.42 mg/dL — ABNORMAL HIGH (ref 0.61–1.24)
GFR calc Af Amer: 49 mL/min — ABNORMAL LOW (ref >60)
GFR, EST NON AFRICAN AMERICAN: 42 mL/min — AB (ref >60)
Glucose, Bld: 92 mg/dL (ref 65–99)
POTASSIUM: 4.1 mmol/L (ref 3.5–5.1)
Sodium: 137 mmol/L (ref 135–145)
TOTAL PROTEIN: 7.1 g/dL (ref 6.5–8.1)

## 2015-01-27 LAB — CBC
HCT: 28.2 % — ABNORMAL LOW (ref 39.0–52.0)
HEMATOCRIT: 31.2 % — AB (ref 39.0–52.0)
HEMOGLOBIN: 9.4 g/dL — AB (ref 13.0–17.0)
HEMOGLOBIN: 8.8 g/dL — AB (ref 13.0–17.0)
MCH: 25.3 pg — ABNORMAL LOW (ref 26.0–34.0)
MCH: 26 pg (ref 26.0–34.0)
MCHC: 30.1 g/dL (ref 30.0–36.0)
MCHC: 31.2 g/dL (ref 30.0–36.0)
MCV: 84.1 fL (ref 78.0–100.0)
MCV: 83.4 fL (ref 78.0–100.0)
PLATELETS: 289 10*3/uL (ref 150–400)
Platelets: 325 10*3/uL (ref 150–400)
RBC: 3.71 MIL/uL — AB (ref 4.22–5.81)
RBC: 3.38 MIL/uL — AB (ref 4.22–5.81)
RDW: 18.8 % — AB (ref 11.5–15.5)
RDW: 18.9 % — ABNORMAL HIGH (ref 11.5–15.5)
WBC: 8.3 10*3/uL (ref 4.0–10.5)
WBC: 6.6 10*3/uL (ref 4.0–10.5)

## 2015-01-27 LAB — IRON AND TIBC
IRON: 18 ug/dL — AB (ref 45–182)
Saturation Ratios: 5 % — ABNORMAL LOW (ref 17.9–39.5)
TIBC: 349 ug/dL (ref 250–450)
UIBC: 331 ug/dL

## 2015-01-27 LAB — BRAIN NATRIURETIC PEPTIDE: B NATRIURETIC PEPTIDE 5: 272 pg/mL — AB (ref 0.0–100.0)

## 2015-01-27 LAB — PREPARE RBC (CROSSMATCH)

## 2015-01-27 LAB — TROPONIN I

## 2015-01-27 LAB — VITAMIN B12: VITAMIN B 12: 386 pg/mL (ref 180–914)

## 2015-01-27 LAB — FERRITIN: Ferritin: 21 ng/mL — ABNORMAL LOW (ref 24–336)

## 2015-01-27 MED ORDER — ACETAMINOPHEN 325 MG PO TABS: 650.0000 mg | ORAL_TABLET | Freq: Four times a day (QID) | ORAL | Status: DC | PRN

## 2015-01-27 MED ORDER — NITROGLYCERIN 0.4 MG SL SUBL: 0.4000 mg | SUBLINGUAL_TABLET | SUBLINGUAL | Status: DC | PRN

## 2015-01-27 MED ORDER — ATORVASTATIN CALCIUM 20 MG PO TABS
20.0000 mg | ORAL_TABLET | Freq: Every day | ORAL | Status: DC
  Administered 2015-01-28 (×2): 20 mg via ORAL
  Filled 2015-01-27 (×2): qty 1

## 2015-01-27 MED ORDER — ADULT MULTIVITAMIN W/MINERALS CH: 1.0000 | ORAL_TABLET | Freq: Every day | ORAL | Status: DC

## 2015-01-27 MED ORDER — POTASSIUM CHLORIDE IN NACL 20-0.9 MEQ/L-% IV SOLN
INTRAVENOUS | Status: DC
  Administered 2015-01-28: 02:00:00 via INTRAVENOUS
  Filled 2015-01-27: qty 1000

## 2015-01-27 MED ORDER — HYDRALAZINE HCL 20 MG/ML IJ SOLN: 5.0000 mg | INTRAMUSCULAR | Status: DC | PRN

## 2015-01-27 MED ORDER — SODIUM CHLORIDE 0.9 % IV SOLN: Freq: Once | INTRAVENOUS | Status: DC

## 2015-01-27 MED ORDER — PANTOPRAZOLE SODIUM 40 MG IV SOLR
40.0000 mg | Freq: Once | INTRAVENOUS | Status: AC
  Administered 2015-01-27: 40 mg via INTRAVENOUS
  Filled 2015-01-27: qty 40

## 2015-01-27 MED ORDER — TAMSULOSIN HCL 0.4 MG PO CAPS
0.4000 mg | ORAL_CAPSULE | Freq: Every day | ORAL | Status: DC
  Administered 2015-01-28 – 2015-01-29 (×2): 0.4 mg via ORAL
  Filled 2015-01-27 (×2): qty 1

## 2015-01-27 MED ORDER — ONE-DAILY MULTI VITAMINS PO TABS: 1.0000 | ORAL_TABLET | Freq: Every day | ORAL | Status: DC

## 2015-01-27 MED ORDER — METOPROLOL TARTRATE 12.5 MG HALF TABLET
12.5000 mg | ORAL_TABLET | Freq: Two times a day (BID) | ORAL | Status: DC
  Administered 2015-01-28 – 2015-01-29 (×4): 12.5 mg via ORAL
  Filled 2015-01-27 (×4): qty 1

## 2015-01-27 MED ORDER — SODIUM CHLORIDE 0.9 % IJ SOLN
3.0000 mL | Freq: Two times a day (BID) | INTRAMUSCULAR | Status: DC
  Administered 2015-01-27 – 2015-01-29 (×4): 3 mL via INTRAVENOUS

## 2015-01-27 NOTE — ED Notes (Signed)
Per pts family pt sent from PCP office, pt sent from PCP office, per pts family the pt had a +hemoccult, pt takes Eloquis 5 mg daily, pt hx of A fib, pt reported to be in a fib at PCP office, pt scheduled for Cardioversion 01/02/2015, the doctor did not complete it at that time & increased Eloquis from 2.5 to 5mg , pt A&O x4, denies CP, SOB, n/v/d, pt told to stop his Eloquis by PCP & increase Protonix

## 2015-01-27 NOTE — ED Notes (Signed)
Attempted report X2 

## 2015-01-27 NOTE — ED Notes (Signed)
Pt denies any complaints, SOB, lightheadedness, dizziness, etc.  Reports noting blood in stool since this past Sat.

## 2015-01-27 NOTE — Progress Notes (Signed)
Patient set up on CPAP of 8. NO O2 bleed in needed. Patient tolerating well. RT will continue to monitor as needed.

## 2015-01-27 NOTE — ED Provider Notes (Signed)
CSN: [id number]     Arrival date & time 01/27/15  1646 History   First MD Initiated Contact with Patient 01/27/15 1827     Chief Complaint  Patient presents with  . Rectal Bleeding     (Consider location/radiation/quality/duration/timing/severity/associated sxs/prior Treatment) Patient is a 81 y.o. male presenting with hematochezia. The history is provided by the patient.  Rectal Bleeding Quality:  Maroon and black and tarry Amount:  Moderate Duration:  2 days Timing:  Constant Progression:  Worsening Chronicity:  New Similar prior episodes: no   Relieved by:  Nothing Worsened by:  Nothing tried Ineffective treatments:  None tried Associated symptoms: no abdominal pain, no fever and no vomiting   Risk factors: anticoagulant use     81 yo M  with a chief complaint of bright red blood per rectum. This was noticed a few days ago. Patient went to his PCPs this morning and there is concern since he is on Eliquis. Was sent to the ED for laboratory evaluation. Patient denies any abdominal pain denies fevers or chills. Denies near-syncope.  Past Medical History  Diagnosis Date  . ABDOMINAL AORTIC ANEURYSM 09/15/2008  . Atrial fibrillation (HCC) 10/10/2006  . Atrial flutter (HCC) 08/18/2006  . BENIGN PROSTATIC HYPERTROPHY, HX OF 08/18/2006  . COLONIC POLYPS, HX OF 08/18/2006  . HYPERLIPIDEMIA 12/04/2009  . HYPERTENSION 08/18/2006  . HYPOTENSION 09/04/2008  . MACULAR DEGENERATION 08/18/2006  . PEPTIC ULCER DISEASE, HX OF 08/18/2006  . RESTLESS LEGS SYNDROME 12/04/2009  . SLEEP APNEA 08/18/2006  . VERTIGO 07/28/2008  . WEAKNESS 06/23/2008  . CORONARY ARTERY DISEASE     non-ST segment MI, March 15, 2010  . Anemia January 2012    admitted with acute MI March 15, 2010 and has significant acute blood loss anemia requiring transfusions of two units of packed RBCs.  Status post upper endoscopy March 22, 2010 without High-Risk bleeding lesion.  History of peptic ulcer disease  . Myocardial  infarction (HCC) 02/2010  . Leg pain   . AAA (abdominal aortic aneurysm) (HCC)   . NSTEMI (non-ST elevated myocardial infarction) (HCC) 2010  . PAD (peripheral artery disease) (HCC)   . PVD (peripheral vascular disease) (HCC)   . History of Doppler ultrasound 05/2011    h/o claudication, stable ABIs  . History of nuclear stress test 2007    persantine; normal, low risk    Past Surgical History  Procedure Laterality Date  . Inguinal hernia repair      Left inguinal  . Coronary artery bypass graft  12/1995    x5; LIMA to ALD, vein graft to RCA (now occluded),SVG to OM, SVG to diagonal  . Abdominal aortic aneurysm repair      stenting   . Cardiac catheterization  02/2010    r/t NSTEMI; loss of SVG to RCA  . Transthoracic echocardiogram  04/2011    EF 55-60%; mild LVH; grade 1 diastolic dysfunction   Family History  Problem Relation Age of Onset  . Heart disease Mother   . Heart disease Father   . Heart disease Sister   . Macular degeneration Sister   . Heart disease Sister   . Stroke Father   . Stroke Brother   . Hypertension Sister   . Heart Problems Sister     x2   Social History  Substance Use Topics  . Smoking status: Former Smoker -- 1.00 packs/day for 40 years    Types: Cigarettes    Quit date: 02/22/1987  . Smokeless tobacco: Never  Used  . Alcohol Use: No    Review of Systems  Constitutional: Negative for fever and chills.  HENT: Negative for congestion and facial swelling.   Eyes: Negative for discharge and visual disturbance.  Respiratory: Negative for shortness of breath and wheezing.   Cardiovascular: Negative for chest pain and palpitations.  Gastrointestinal: Positive for blood in stool and hematochezia. Negative for vomiting, abdominal pain and diarrhea.  Musculoskeletal: Negative for myalgias and arthralgias.  Skin: Negative for color change and rash.  Neurological: Negative for tremors, syncope and headaches.  Psychiatric/Behavioral: Negative for  confusion and dysphoric mood.      Allergies  Cilostazol and Doxazosin  Home Medications   Prior to Admission medications   Medication Sig Start Date End Date Taking? Authorizing Provider  acetaminophen (TYLENOL) 325 MG tablet Take 650 mg by mouth every 6 (six) hours as needed for moderate pain.    Yes Historical Provider, MD  apixaban (ELIQUIS) 5 MG TABS tablet Take 1 tablet (5 mg total) by mouth 2 (two) times daily. 01/02/15  Yes Quintella Reichert, MD  atorvastatin (LIPITOR) 20 MG tablet Take 1 tablet (20 mg total) by mouth daily. Patient taking differently: Take 20 mg by mouth daily at 6 PM.  02/28/14  Yes Gordy Savers, MD  docusate sodium (COLACE) 100 MG capsule Take 200 mg by mouth at bedtime.    Yes Historical Provider, MD  metoprolol tartrate (LOPRESSOR) 25 MG tablet TAKE ONE TABLET BY MOUTH TWICE DAILY. 01/19/15  Yes Chrystie Nose, MD  Multiple Vitamin (MULTIVITAMIN) tablet Take 1 tablet by mouth daily.    Yes Historical Provider, MD  nitroGLYCERIN (NITROSTAT) 0.4 MG SL tablet Place 1 tablet (0.4 mg total) under the tongue every 5 (five) minutes as needed. FOR CHEST PAIN 01/20/15  Yes Chrystie Nose, MD  pantoprazole (PROTONIX) 40 MG tablet TAKE ONE TABLET BY MOUTH ONCE DAILY Patient taking differently: TAKE 40 MG BY MOUTH TWICE DAILY 12/18/14  Yes Chrystie Nose, MD  tamsulosin (FLOMAX) 0.4 MG CAPS capsule TAKE ONE CAPSULE BY MOUTH ONCE DAILY Patient taking differently: TAKE ONE CAPSULE BY MOUTH ONCE DAILY AT BEDTIME 01/19/15  Yes Gordy Savers, MD   BP 146/69 mmHg  Pulse 113  Temp(Src) 97.8 F (36.6 C) (Oral)  Resp 16  Ht  (1.803 m)  Wt 199 lb 12.8 oz (90.629 kg)  BMI 27.88 kg/m2  SpO2 99% Physical Exam  Constitutional: He is oriented to person, place, and time. He appears well-developed and well-nourished.  HENT:  Head: Normocephalic and atraumatic.  Eyes: EOM are normal. Pupils are equal, round, and reactive to light.  Neck: Normal range of motion.  Neck supple. No JVD present.  Cardiovascular: Normal rate and regular rhythm.  Exam reveals no gallop and no friction rub.   No murmur heard. Pulmonary/Chest: No respiratory distress. He has no wheezes.  Abdominal: He exhibits no distension. There is no tenderness. There is no rebound and no guarding.  Musculoskeletal: Normal range of motion.  Neurological: He is alert and oriented to person, place, and time.  Skin: No rash noted. No pallor.  Psychiatric: He has a normal mood and affect. His behavior is normal.  Nursing note and vitals reviewed.   ED Course  Procedures (including critical care time) Labs Review Labs Reviewed  COMPREHENSIVE METABOLIC PANEL - Abnormal; Notable for the following:    BUN 22 (*)    Creatinine, Ser 1.42 (*)    GFR calc non Af Amer 42 (*)  GFR calc Af Amer 49 (*)    All other components within normal limits  CBC - Abnormal; Notable for the following:    RBC 3.71 (*)    Hemoglobin 9.4 (*)    HCT 31.2 (*)    MCH 25.3 (*)    RDW 18.8 (*)    All other components within normal limits  CBC - Abnormal; Notable for the following:    RBC 3.38 (*)    Hemoglobin 8.8 (*)    HCT 28.2 (*)    RDW 18.9 (*)    All other components within normal limits  BRAIN NATRIURETIC PEPTIDE - Abnormal; Notable for the following:    B Natriuretic Peptide 272.0 (*)    All other components within normal limits  TROPONIN I  CBC  CBC  CBC  OCCULT BLOOD X 1 CARD TO LAB, STOOL  OCCULT BLOOD X 1 CARD TO LAB, STOOL  COMPREHENSIVE METABOLIC PANEL  PROTIME-INR  APTT  IRON AND TIBC  FERRITIN  FOLATE RBC  VITAMIN B12  TYPE AND SCREEN  PREPARE RBC (CROSSMATCH)    Imaging Review No results found. I have personally reviewed and evaluated these images and lab results as part of my medical decision-making.   EKG Interpretation   Date/Time:  Tuesday January 27 2015 17:02:36 EST Ventricular Rate:  120 PR Interval:    QRS Duration: 100 QT Interval:  338 QTC Calculation:  477 R Axis:   33 Text Interpretation:  Atrial fibrillation with rapid ventricular response  Abnormal ECG Atrial fibrillation has replaced Normal sinus rhythm  Otherwise no significant change Confirmed by Adlean Hardeman MD, Reuel BoomANIEL [id number]) on  01/27/2015 6:28:46 PM      MDM   Final diagnoses:  Melena    81 yo M with a chief complaint of bright red blood per rectum. Has had increased stool frequency over the past couple days. PCP had gross melena on the rectal exam. Hemoglobin with 1 g drop over the past month. Will admit.  The patients results and plan were reviewed and discussed.   Any x-rays performed were independently reviewed by myself.   Differential diagnosis were considered with the presenting HPI.  Medications  nitroGLYCERIN (NITROSTAT) SL tablet 0.4 mg (not administered)  tamsulosin (FLOMAX) capsule 0.4 mg (not administered)  metoprolol tartrate (LOPRESSOR) tablet 12.5 mg (not administered)  atorvastatin (LIPITOR) tablet 20 mg (not administered)  acetaminophen (TYLENOL) tablet 650 mg (not administered)  0.9 %  sodium chloride infusion (not administered)  sodium chloride 0.9 % injection 3 mL (not administered)  0.9 % NaCl with KCl 20 mEq/ L  infusion (not administered)  hydrALAZINE (APRESOLINE) injection 5 mg (not administered)  multivitamin with minerals tablet 1 tablet (not administered)  pantoprazole (PROTONIX) injection 40 mg (40 mg Intravenous Given 01/27/15 1918)    Filed Vitals:   01/27/15 2001 01/27/15 2030 01/27/15 2118 01/27/15 2128  BP:  142/81 146/69   Pulse: 106 113    Temp:   97.8 F (36.6 C)   TempSrc:   Oral   Resp: 18 17 16    Height:    5\' 11"  (1.803 m)  Weight:    199 lb 12.8 oz (90.629 kg)  SpO2: 100% 100% 99%     Final diagnoses:  Melena    Admission/ observation were discussed with the admitting physician, patient and/or family and they are comfortable with the plan.      Melene Planan Noga Fogg, DO 01/27/15 2344

## 2015-01-27 NOTE — Patient Instructions (Signed)
Increase Protonix to twice daily.  Short-term  Supplemental  iron twice daily  Return tomorrow for repeat CBC  Report to the emergency room if you develop weakness, dizziness, lightheadedness  Discontinue Eliquis

## 2015-01-27 NOTE — Progress Notes (Signed)
Subjective:    Patient ID: Corey Mcdonald, male    DOB: Jul 27, 1923, 81 y.o.   MRN: [id number]  HPI  81 year old patient who has a history of atrial fibrillation.  He has been on Eliquis anticoagulation and was seen by cardiology on 11/29.   Earlier last month, Eliquis was increased to 5 mg twice a day prior to a scheduled elective cardioversion.  For the past 3 days there is been some concern about possible rectal bleeding.  The patient has partial blindness, but his son has noticed some possible blood-tinged toilet water. The patient has been evaluated for GI bleeding in the past.  He is status post EGD in February 2012.  The revealed some gastric erosions and duodenal inflammation.  Colonoscopy in June 2011 revealed diverticulosis only.  Office evaluation today revealed melanotic heme positive stool.  Past Medical History  Diagnosis Date  . ABDOMINAL AORTIC ANEURYSM 09/15/2008  . Atrial fibrillation (HCC) 10/10/2006  . Atrial flutter (HCC) 08/18/2006  . BENIGN PROSTATIC HYPERTROPHY, HX OF 08/18/2006  . COLONIC POLYPS, HX OF 08/18/2006  . HYPERLIPIDEMIA 12/04/2009  . HYPERTENSION 08/18/2006  . HYPOTENSION 09/04/2008  . MACULAR DEGENERATION 08/18/2006  . PEPTIC ULCER DISEASE, HX OF 08/18/2006  . RESTLESS LEGS SYNDROME 12/04/2009  . SLEEP APNEA 08/18/2006  . VERTIGO 07/28/2008  . WEAKNESS 06/23/2008  . CORONARY ARTERY DISEASE     non-ST segment MI, March 15, 2010  . Anemia January 2012    admitted with acute MI March 15, 2010 and has significant acute blood loss anemia requiring transfusions of two units of packed RBCs.  Status post upper endoscopy March 22, 2010 without High-Risk bleeding lesion.  History of peptic ulcer disease  . Myocardial infarction (HCC) 02/2010  . Leg pain   . AAA (abdominal aortic aneurysm) (HCC)   . NSTEMI (non-ST elevated myocardial infarction) (HCC) 2010  . PAD (peripheral artery disease) (HCC)   . PVD (peripheral vascular disease) (HCC)   . History of Doppler  ultrasound 05/2011    h/o claudication, stable ABIs  . History of nuclear stress test 2007    persantine; normal, low risk     Social History   Social History  . Marital Status: Married    Spouse Name: N/A  . Number of Children: 2  . Years of Education: N/A   Occupational History  . Not on file.   Social History Main Topics  . Smoking status: Former Smoker -- 1.00 packs/day for 40 years    Types: Cigarettes    Quit date: 02/22/1987  . Smokeless tobacco: Never Used  . Alcohol Use: No  . Drug Use: No  . Sexual Activity: Not on file   Other Topics Concern  . Not on file   Social History Narrative    Past Surgical History  Procedure Laterality Date  . Inguinal hernia repair      Left inguinal  . Coronary artery bypass graft  12/1995    x5; LIMA to ALD, vein graft to RCA (now occluded),SVG to OM, SVG to diagonal  . Abdominal aortic aneurysm repair      stenting   . Cardiac catheterization  02/2010    r/t NSTEMI; loss of SVG to RCA  . Transthoracic echocardiogram  04/2011    EF 55-60%; mild LVH; grade 1 diastolic dysfunction    Family History  Problem Relation Age of Onset  . Heart disease Mother   . Heart disease Father   . Heart disease Sister   . Macular  degeneration Sister   . Heart disease Sister   . Stroke Father   . Stroke Brother   . Hypertension Sister   . Heart Problems Sister     x2    Allergies  Allergen Reactions  . Cilostazol Other (See Comments)    Adverse reaction per patient  . Doxazosin Other (See Comments)    INTOLERANCE     Current Outpatient Prescriptions on File Prior to Visit  Medication Sig Dispense Refill  . acetaminophen (TYLENOL) 325 MG tablet Take 650 mg by mouth every 6 (six) hours as needed for moderate pain.     Marland Kitchen. apixaban (ELIQUIS) 5 MG TABS tablet Take 1 tablet (5 mg total) by mouth 2 (two) times daily. 60 tablet 3  . atorvastatin (LIPITOR) 20 MG tablet Take 1 tablet (20 mg total) by mouth daily. 90 tablet 3  . docusate  sodium (COLACE) 100 MG capsule Take 100 mg by mouth daily.     . metoprolol tartrate (LOPRESSOR) 25 MG tablet TAKE ONE TABLET BY MOUTH TWICE DAILY. 60 tablet 1  . Multiple Vitamin (MULTIVITAMIN) tablet Take 1 tablet by mouth daily.     . nitroGLYCERIN (NITROSTAT) 0.4 MG SL tablet Place 1 tablet (0.4 mg total) under the tongue every 5 (five) minutes as needed. FOR CHEST PAIN 25 tablet 6  . pantoprazole (PROTONIX) 40 MG tablet TAKE ONE TABLET BY MOUTH ONCE DAILY 60 tablet 0  . tamsulosin (FLOMAX) 0.4 MG CAPS capsule TAKE ONE CAPSULE BY MOUTH ONCE DAILY 30 capsule 5  . [DISCONTINUED] carbidopa-levodopa (SINEMET CR) 25-100 MG per tablet Take by mouth. 1/2 tablet at bedtime      No current facility-administered medications on file prior to visit.    BP 150/80 mmHg  Pulse 67  Temp(Src) 98.4 F (36.9 C) (Oral)  Resp 20  Ht 5\' 11"  (1.803 m)  Wt 204 lb (92.534 kg)  BMI 28.46 kg/m2  SpO2 97%     Review of Systems  Constitutional: Negative for fever, chills, appetite change and fatigue.       Cold sweat.  7 days ago  HENT: Negative for congestion, dental problem, ear pain, hearing loss, sore throat, tinnitus, trouble swallowing and voice change.   Eyes: Negative for pain, discharge and visual disturbance.  Respiratory: Negative for cough, chest tightness, wheezing and stridor.   Cardiovascular: Negative for chest pain, palpitations and leg swelling.  Gastrointestinal: Positive for blood in stool. Negative for nausea, vomiting, abdominal pain, diarrhea, constipation and abdominal distention.  Genitourinary: Negative for urgency, hematuria, flank pain, discharge, difficulty urinating and genital sores.  Musculoskeletal: Negative for myalgias, back pain, joint swelling, arthralgias, gait problem and neck stiffness.  Skin: Negative for rash.  Neurological: Negative for dizziness, syncope, speech difficulty, weakness, numbness and headaches.  Hematological: Negative for adenopathy. Does not  bruise/bleed easily.  Psychiatric/Behavioral: Negative for behavioral problems and dysphoric mood. The patient is not nervous/anxious.        Objective:   Physical Exam  Constitutional: He is oriented to person, place, and time. He appears well-developed.  HENT:  Head: Normocephalic.  Right Ear: External ear normal.  Left Ear: External ear normal.  Eyes: Conjunctivae and EOM are normal.  Neck: Normal range of motion.  Cardiovascular: Normal rate and normal heart sounds.   Irregular rhythm consistent with A. fib  Pulmonary/Chest: Breath sounds normal.  Abdominal: Soft. Bowel sounds are normal. He exhibits no distension. There is no tenderness. There is no rebound.  Genitourinary:  Melanotic hematest positive stool  Musculoskeletal: Normal range of motion. He exhibits no edema or tenderness.  Neurological: He is alert and oriented to person, place, and time.  Psychiatric: He has a normal mood and affect. His behavior is normal.          Assessment & Plan:   Melena.  Will discontinue anticoagulation.   Patient advised to report to the ED immediately for CBC and possible inpatient observation.   Discussed with son who is accompanying patient who is agreeable.  Will need GI evaluation  History of peptic ulcer disease  History of diverticulosis  Partial blindness secondary to macular degeneration   Chronic atrial fibrillation.  Hold anticoagulation. Hypertension, stable History of anemia.  Refer to ED for stat CBC and consideration of inpatient admission and observation.

## 2015-01-27 NOTE — Progress Notes (Signed)
Pre visit review using our clinic review tool, if applicable. No additional management support is needed unless otherwise documented below in the visit note. 

## 2015-01-27 NOTE — H&P (Signed)
[facility] History and Physical  Corey Mcdonald Limones WUJ:[id number] DOB: February 01, 1924 DOA: 01/27/2015  Referring physician: ED physician PCP: [name],[name], MD  Specialists: Gretta Beganodd Early, MD (vascular); Dr. Rennis GoldenHilty [facility])   Chief Complaint: Melena  HPI: Corey Mcdonald Mcelhaney is a 81 y.o. male with PMH of coronary artery disease status post CABG, peripheral arterial disease, and abdominal aortic aneurysm status post endovascular repair, and chronic atrial fibrillation on Elliquis who presents to the ED at the direction of his primary care physician for evaluation of melena and acute anemia in the setting of recent increase in Elliquis dose. The patient is accompanied by his son, Corey Mcdonald, who assists with the history. The patient has been in his usual state of health and had his Elliquis dose increased last month from 2.5 to 5 mg twice a day in preparation for a scheduled cardioversion on 02/19/2015. On 01/24/2015, the patient notified his son that there was a red tinge to the toilet water after moving his bowels. Stool continued to be dark with blood tinged toilet water and so he saw his PCP today for evaluation of such. Of note, the patient had been completely asymptomatic, with no abdominal pain, diarrhea, malaise, chest pain, headache, or hematemesis. There is been no other easy bruising or bleeding. At the direction of his PCP, the patient was to hold the anticoagulant and increase his Protonix dose. Given the medical complexity of the situation, with a bleeding patient with atrial fibrillation at high risk for CVA, as well as the acute anemia, the patient was directed to the ED by his PCP.  In ED, patient was found to have a hemoglobin of 9.4, down from 10.4 one month prior, and down from an apparent baseline of 12. The patient remained hemodynamically stable and with O2 saturation close to 100% on room air in the emergency department and was admitted for ongoing evaluation and management of  blood loss anemia in an anticoagulated patient with chronic atrial fibrillation.  Where does patient live?    [facility] Can patient participate in ADLs?  Yes       Review of Systems:   General: no fevers, chills, sweats, weight change, poor appetite, or fatigue HEENT: no blurry vision, hearing changes or sore throat Pulm: no dyspnea, cough, or wheeze CV: no chest pain or palpitations Abd: no nausea, vomiting, abdominal pain, diarrhea, or constipation GU: no dysuria, hematuria, increased urinary frequency, or urgency  Ext: no leg edema Neuro: no focal weakness, numbness, or tingling, no vision change or hearing loss Skin: no rash, no wounds MSK: No muscle spasm, no deformity, no red, hot, or swollen joint Heme: No easy bruising or bleeding Travel history: No recent long distant travel    Allergy:  Allergies  Allergen Reactions  . Cilostazol Other (See Comments)    Adverse reaction per patient  . Doxazosin Other (See Comments)    INTOLERANCE     Past Medical History  Diagnosis Date  . ABDOMINAL AORTIC ANEURYSM 09/15/2008  . Atrial fibrillation (HCC) 10/10/2006  . Atrial flutter (HCC) 08/18/2006  . BENIGN PROSTATIC HYPERTROPHY, HX OF 08/18/2006  . COLONIC POLYPS, HX OF 08/18/2006  . HYPERLIPIDEMIA 12/04/2009  . HYPERTENSION 08/18/2006  . HYPOTENSION 09/04/2008  . MACULAR DEGENERATION 08/18/2006  . PEPTIC ULCER DISEASE, HX OF 08/18/2006  . RESTLESS LEGS SYNDROME 12/04/2009  . SLEEP APNEA 08/18/2006  . VERTIGO 07/28/2008  . WEAKNESS 06/23/2008  . CORONARY ARTERY DISEASE     non-ST segment MI, March 15, 2010  . Anemia  January 2012    admitted with acute MI March 15, 2010 and has significant acute blood loss anemia requiring transfusions of two units of packed RBCs.  Status post upper endoscopy March 22, 2010 without High-Risk bleeding lesion.  History of peptic ulcer disease  . Myocardial infarction (HCC) 02/2010  . Leg pain   . AAA (abdominal aortic aneurysm) (HCC)   .  NSTEMI (non-ST elevated myocardial infarction) (HCC) 2010  . PAD (peripheral artery disease) (HCC)   . PVD (peripheral vascular disease) (HCC)   . History of Doppler ultrasound 05/2011    h/o claudication, stable ABIs  . History of nuclear stress test 2007    persantine; normal, low risk     Past Surgical History  Procedure Laterality Date  . Inguinal hernia repair      Left inguinal  . Coronary artery bypass graft  12/1995    x5; LIMA to ALD, vein graft to RCA (now occluded),SVG to OM, SVG to diagonal  . Abdominal aortic aneurysm repair      stenting   . Cardiac catheterization  02/2010    r/t NSTEMI; loss of SVG to RCA  . Transthoracic echocardiogram  04/2011    EF 55-60%; mild LVH; grade 1 diastolic dysfunction    Social History:  reports that he quit smoking about 27 years ago. His smoking use included Cigarettes. He has a 40 pack-year smoking history. He has never used smokeless tobacco. He reports that he does not drink alcohol or use illicit drugs.  Family History:  Family History  Problem Relation Age of Onset  . Heart disease Mother   . Heart disease Father   . Heart disease Sister   . Macular degeneration Sister   . Heart disease Sister   . Stroke Father   . Stroke Brother   . Hypertension Sister   . Heart Problems Sister     x2     Prior to Admission medications   Medication Sig Start Date End Date Taking? Authorizing Provider  acetaminophen (TYLENOL) 325 MG tablet Take 650 mg by mouth every 6 (six) hours as needed for moderate pain.    Yes Historical Provider, MD  apixaban (ELIQUIS) 5 MG TABS tablet Take 1 tablet (5 mg total) by mouth 2 (two) times daily. 01/02/15  Yes Quintella Reichert, MD  atorvastatin (LIPITOR) 20 MG tablet Take 1 tablet (20 mg total) by mouth daily. Patient taking differently: Take 20 mg by mouth daily at 6 PM.  02/28/14  Yes Gordy Savers, MD  docusate sodium (COLACE) 100 MG capsule Take 200 mg by mouth at bedtime.    Yes Historical  Provider, MD  metoprolol tartrate (LOPRESSOR) 25 MG tablet TAKE ONE TABLET BY MOUTH TWICE DAILY. 01/19/15  Yes Chrystie Nose, MD  Multiple Vitamin (MULTIVITAMIN) tablet Take 1 tablet by mouth daily.    Yes Historical Provider, MD  nitroGLYCERIN (NITROSTAT) 0.4 MG SL tablet Place 1 tablet (0.4 mg total) under the tongue every 5 (five) minutes as needed. FOR CHEST PAIN 01/20/15  Yes Chrystie Nose, MD  pantoprazole (PROTONIX) 40 MG tablet TAKE ONE TABLET BY MOUTH ONCE DAILY Patient taking differently: TAKE 40 MG BY MOUTH TWICE DAILY 12/18/14  Yes Chrystie Nose, MD  tamsulosin (FLOMAX) 0.4 MG CAPS capsule TAKE ONE CAPSULE BY MOUTH ONCE DAILY Patient taking differently: TAKE ONE CAPSULE BY MOUTH ONCE DAILY AT BEDTIME 01/19/15  Yes Gordy Savers, MD    Physical Exam: Filed Vitals:   01/27/15 1900  01/27/15 2000 01/27/15 2001 01/27/15 2030  BP: 147/92 140/96  142/81  Pulse: 60  106 113  Temp:      TempSrc:      Resp: SpO2: 94%  100% 100%   General: Not in acute distress HEENT:       Eyes: PERRL, EOMI, no scleral icterus or  conjunctival pallor.       ENT: No discharge from the ears or nose, no pharyngeal ulcers, petechiae or exudate, no tonsillar enlargement.        Neck: No JVD, no bruit, no appreciable mass Heme: No cervical adenopathy, no pallor Cardiac: S1/S2, RRR, No murmurs, No gallops or rubs. Pulm: Good air movement bilaterally. No rales, wheezing, rhonchi or rubs. Abd: Soft, nondistended, nontender, no rebound pain or gaurding, no mass or organomegaly, BS present. Ext: No LE edema bilaterally. 2+DP/PT pulse bilaterally. Musculoskeletal: No gross deformity, no red, hot, swollen joints, no limitation in ROM  Skin: No rashes or wounds on exposed surfaces  Neuro: Alert, oriented X3, cranial nerves II-XII grossly intact. No focal findings Psych: Patient is not overtly psychotic.  Labs on Admission:  Basic Metabolic Panel:  Recent Labs Lab 01/27/15 1723  NA  137  K 4.1  CL 103  CO2 24  GLUCOSE 92  BUN 22*  CREATININE 1.42*  CALCIUM 9.4   Liver Function Tests:  Recent Labs Lab 01/27/15 1723  AST 23  ALT 23  ALKPHOS 75  BILITOT 0.5  PROT 7.1  ALBUMIN 3.6   No results for input(s): LIPASE, AMYLASE in the last 168 hours. No results for input(s): AMMONIA in the last 168 hours. CBC:  Recent Labs Lab 01/27/15 1723  WBC 8.3  HGB 9.4*  HCT 31.2*  MCV 84.1  PLT 325   Cardiac Enzymes: No results for input(s): CKTOTAL, CKMB, CKMBINDEX, TROPONINI in the last 168 hours.  BNP (last 3 results) No results for input(s): BNP in the last 8760 hours.  ProBNP (last 3 results) No results for input(s): PROBNP in the last 8760 hours.  CBG: No results for input(s): GLUCAP in the last 168 hours.  Radiological Exams on Admission: No results found.  EKG: Independently reviewed.  Afib RVR.    Assessment/Plan  1. GIB   Bloody BMs since 01/24/2015. He has prior history of GI bleed requiring 2 units of packed red blood cells and February 2012, at which time he spent to have gastric erosions in the duodenal inflammation on EGD. He was started on a PPI at that time. Last colonoscopy on file is from June 2011 and features diverticulosis. On presentation, the patient's stool is melanotic and heme positive. Hemoglobin is dropped 1 g to 9.4 in the last month, and 2-1/2 g from 6 months ago. Differential in this case includes a slow diverticular bleed, but more likely is an upper GI source, potentially precipitated by recent increase in anticoagulant dose. He will be admitted to cardiac telemetry, anticoagulation and pharmacologically VTE prophylaxis will be held. Patient will be continued on IV Protonix, 40 mg daily. The risk of stopping the anticoagulant, specifically a CVA, was discussed with the patient and his son at the bedside. They agreed with holding this medication. Serial H&H will be followed and a unit of blood is been requested to be held for  hemoglobin less than 7. The patient has a AAA that was repaired endovascularly, and while highly unlikely, and aorto anterior fistula has been considered. Based on the patient's clinical course, a  GI consultation request may be considered.   2. Anemia  There is some degree of mild normocytic anemia going back at least a year in the EMR, but recent worsening is likely secondary to the ongoing GI bleed. As noted above, 1 unit of packed red blood cells this on hold for hemoglobin less than 7. A higher transfusion threshold can be considered in this patient with history of ischemic heart disease.  3. Atrial fib   Mr. Ancheta has chronic atrial fibrillation on a rate control strategy with metoprolol. Given his age and comorbidities, he is at particularly high risk for CVA and has been anticoagulated with Eliquis. The patient saw his cardiologist on 01/20/2015 and the anticoagulant was in increased from 2 mg to 5 mg twice a day in preparation for an elective cardioversion scheduled for 02/19/2015. Patient went into RVR in the emergency department and rate spontaneously came down to an acceptable range. Metoprolol will be continued.   4. CKD 2-3  patient has a history of chronic kidney disease which may contribute to his chronic normocytic anemia and is likely secondary to atherosclerotic vascular disease. We will attempt to avoid nephrotoxic agents or extreme blood pressure fluctuations.  DVT ppx:  SCDs  Code Status: DNR Family Communication:  Yes, patient's son, Sarr, at bed side.  DPOA is daughter, Velna Ochs, [phone] Disposition Plan: Admit to inpatient   Date of Service 01/27/2015    Briscoe Deutscher [facility] Pager [phone]  If 7PM-7AM, please contact night-coverage [email or web address] Password [email or web address] 01/27/2015, 9:12 PM

## 2015-01-27 NOTE — ED Notes (Signed)
Attempted report X1

## 2015-01-28 ENCOUNTER — Encounter (HOSPITAL_COMMUNITY): Payer: Self-pay | Admitting: *Deleted

## 2015-01-28 ENCOUNTER — Encounter (HOSPITAL_COMMUNITY)
Admission: EM | Disposition: A | Discharge status: Home / Self Care | Payer: Self-pay | Source: Home / Self Care | Attending: Internal Medicine

## 2015-01-28 HISTORY — PX: ESOPHAGOGASTRODUODENOSCOPY: SHX5428

## 2015-01-28 LAB — CBC
HCT: 30.3 % — ABNORMAL LOW (ref 39.0–52.0)
HEMATOCRIT: 27.8 % — AB (ref 39.0–52.0)
HEMATOCRIT: 31.1 % — AB (ref 39.0–52.0)
HEMOGLOBIN: 8.7 g/dL — AB (ref 13.0–17.0)
Hemoglobin: 9.1 g/dL — ABNORMAL LOW (ref 13.0–17.0)
Hemoglobin: 9.3 g/dL — ABNORMAL LOW (ref 13.0–17.0)
MCH: 25.9 pg — ABNORMAL LOW (ref 26.0–34.0)
MCH: 25.1 pg — ABNORMAL LOW (ref 26.0–34.0)
MCH: 25.4 pg — AB (ref 26.0–34.0)
MCHC: 31.3 g/dL (ref 30.0–36.0)
MCHC: 30 g/dL (ref 30.0–36.0)
MCHC: 29.9 g/dL — AB (ref 30.0–36.0)
MCV: 82.7 fL (ref 78.0–100.0)
MCV: 83.8 fL (ref 78.0–100.0)
MCV: 84.6 fL (ref 78.0–100.0)
PLATELETS: 252 10*3/uL (ref 150–400)
PLATELETS: 294 10*3/uL (ref 150–400)
Platelets: 268 10*3/uL (ref 150–400)
RBC: 3.36 MIL/uL — ABNORMAL LOW (ref 4.22–5.81)
RBC: 3.71 MIL/uL — ABNORMAL LOW (ref 4.22–5.81)
RBC: 3.58 MIL/uL — AB (ref 4.22–5.81)
RDW: 18.6 % — AB (ref 11.5–15.5)
RDW: 18.9 % — AB (ref 11.5–15.5)
RDW: 18.9 % — AB (ref 11.5–15.5)
WBC: 6.8 10*3/uL (ref 4.0–10.5)
WBC: 7.4 10*3/uL (ref 4.0–10.5)
WBC: 7.6 10*3/uL (ref 4.0–10.5)

## 2015-01-28 LAB — COMPREHENSIVE METABOLIC PANEL
ALBUMIN: 3 g/dL — AB (ref 3.5–5.0)
ALT: 18 U/L (ref 17–63)
ANION GAP: 8 (ref 5–15)
AST: 19 U/L (ref 15–41)
Alkaline Phosphatase: 64 U/L (ref 38–126)
BILIRUBIN TOTAL: 0.4 mg/dL (ref 0.3–1.2)
BUN: 21 mg/dL — AB (ref 6–20)
CHLORIDE: 107 mmol/L (ref 101–111)
CO2: 25 mmol/L (ref 22–32)
Calcium: 8.8 mg/dL — ABNORMAL LOW (ref 8.9–10.3)
Creatinine, Ser: 1.31 mg/dL — ABNORMAL HIGH (ref 0.61–1.24)
GFR calc Af Amer: 54 mL/min — ABNORMAL LOW (ref >60)
GFR calc non Af Amer: 47 mL/min — ABNORMAL LOW (ref >60)
GLUCOSE: 110 mg/dL — AB (ref 65–99)
POTASSIUM: 3.8 mmol/L (ref 3.5–5.1)
SODIUM: 140 mmol/L (ref 135–145)
TOTAL PROTEIN: 6.2 g/dL — AB (ref 6.5–8.1)

## 2015-01-28 LAB — PROTIME-INR
INR: 1.25 (ref 0.00–1.49)
Prothrombin Time: 15.8 seconds — ABNORMAL HIGH (ref 11.6–15.2)

## 2015-01-28 LAB — APTT: APTT: 32 s (ref 24–37)

## 2015-01-28 SURGERY — EGD (ESOPHAGOGASTRODUODENOSCOPY)
Anesthesia: Moderate Sedation

## 2015-01-28 MED ORDER — FENTANYL CITRATE (PF) 100 MCG/2ML IJ SOLN
INTRAMUSCULAR | Status: DC | PRN
  Administered 2015-01-28: 25 ug via INTRAVENOUS

## 2015-01-28 MED ORDER — FENTANYL CITRATE (PF) 100 MCG/2ML IJ SOLN
INTRAMUSCULAR | Status: AC
  Filled 2015-01-28: qty 2

## 2015-01-28 MED ORDER — BUTAMBEN-TETRACAINE-BENZOCAINE 2-2-14 % EX AERO
INHALATION_SPRAY | CUTANEOUS | Status: DC | PRN
  Administered 2015-01-28: 2 via TOPICAL

## 2015-01-28 MED ORDER — SODIUM CHLORIDE 0.9 % IV SOLN
INTRAVENOUS | Status: DC
  Administered 2015-01-28: 13:00:00 via INTRAVENOUS

## 2015-01-28 MED ORDER — MIDAZOLAM HCL 5 MG/ML IJ SOLN
INTRAMUSCULAR | Status: AC
  Filled 2015-01-28: qty 1

## 2015-01-28 MED ORDER — PANTOPRAZOLE SODIUM 40 MG IV SOLR
40.0000 mg | Freq: Two times a day (BID) | INTRAVENOUS | Status: DC
  Administered 2015-01-28: 40 mg via INTRAVENOUS
  Filled 2015-01-28: qty 40

## 2015-01-28 MED ORDER — PANTOPRAZOLE SODIUM 40 MG PO TBEC: 40.0000 mg | DELAYED_RELEASE_TABLET | Freq: Every day | ORAL | Status: DC

## 2015-01-28 MED ORDER — APIXABAN 5 MG PO TABS
5.0000 mg | ORAL_TABLET | Freq: Two times a day (BID) | ORAL | Status: DC
  Administered 2015-01-28 – 2015-01-29 (×2): 5 mg via ORAL
  Filled 2015-01-28 (×2): qty 1

## 2015-01-28 MED ORDER — MIDAZOLAM HCL 10 MG/2ML IJ SOLN
INTRAMUSCULAR | Status: DC | PRN
  Administered 2015-01-28: 2 mg via INTRAVENOUS

## 2015-01-28 MED ORDER — PANTOPRAZOLE SODIUM 40 MG PO TBEC
40.0000 mg | DELAYED_RELEASE_TABLET | Freq: Every day | ORAL | Status: DC
Start: 2015-01-28 — End: 2015-01-29
  Administered 2015-01-28 – 2015-01-29 (×2): 40 mg via ORAL
  Filled 2015-01-28 (×2): qty 1

## 2015-01-28 NOTE — Progress Notes (Signed)
Patient admitted after midnight. Chart reviewed. Patient examined. No further stools. Was scheduled to have elective cardioversion later on this month. EGD in 2012 by Dr. Madilyn FiremanHayes showed esophageal ring and duodenal stricture. Colonoscopy in 2011 showed diverticulosis. Will consult GI for endoscopy, as patient will need to be back on blood thinners. Will keep nothing by mouth in case EGD can be done today. If not, will feed. Monitor H&H and stools.  Corey Curborinna [name], MD [facility] [email or web address] Password [email or web address]

## 2015-01-28 NOTE — Progress Notes (Signed)
[facility]. Admit for observation/ GI bleed. VS stable. Afib on telemetry. No skin break down. Pt is leagally blind. Alert and oriented X 4.

## 2015-01-28 NOTE — Op Note (Signed)
[facility] [address] [place], [id number]   ENDOSCOPY PROCEDURE REPORT  PATIENT: Corey Mcdonald, Caius  MR#: [id number] BIRTHDATE: [birth year] , 89  yrs. old GENDER: male ENDOSCOPIST: Vida RiggerMarc Tearah Saulsbury, MD REFERRED BY: PROCEDURE DATE:  01/28/2015 PROCEDURE:  EGD, diagnostic ASA CLASS:     Class III INDICATIONS:  melena. MEDICATIONS: Fentanyl 25 mcg IV and Versed 2 mg IV TOPICAL ANESTHETIC: Cetacaine Spray  DESCRIPTION OF PROCEDURE: After the risks benefits and alternatives of the procedure were thoroughly explained, informed consent was obtained.  The Pentax Gastroscope [id number] endoscope was introduced through the mouth and advanced to the second portion of the duodenum , Without limitations.  The instrument was slowly withdrawn as the mucosa was fully examined. Estimated blood loss is zero unless otherwise noted in this procedure report.    findings are recorded below       Retroflexed views revealed no abnormalities.     The scope was then withdrawn from the patient and the procedure completed.  COMPLICATIONS: There were no immediate complications.  ENDOSCOPIC IMPRESSION: 1. Small hiatal hernia with proximal fibrous ring minimal trauma with passing scope 2. duodenal c-Loop ringnot significant able to pass scope easily 3. Otherwise within normal limits EGD without signs of bleeding  RECOMMENDATIONS: okay to resume blood thinners continue pump inhibitor once a day avoid aspirin and nonsteroidals and happy to see back when necessary if signs of bleeding to discuss further GI workup like colonoscopy  REPEAT EXAM: as needed  eSigned:  Vida RiggerMarc Mingo Siegert, MD 01/28/2015 3:08 PM    CC:  CPT CODES: ICD CODES:  The ICD and CPT codes recommended by this software are interpretations from the data that the clinical staff has captured with the software.  The verification of the translation of this report to the ICD and CPT codes and modifiers is the  sole responsibility of the health care institution and practicing physician where this report was generated.  [employer] will not be held responsible for the validity of the ICD and CPT codes included on this report.  AMA assumes no liability for data contained or not contained herein. CPT is a Publishing rights managerregistered trademark of the [employer].  PATIENT NAME:  Corey Mcdonald, [place] MR#: [id number]

## 2015-01-28 NOTE — Consult Note (Signed)
Reason for Consult: GI bleeding Referring Physician: Hospital team  Corey Mcdonald is an 81 y.o. male.  HPI: Patient seen at the request of the hospital team for subacute GI bleeding and need for blood thinners and his history was discussed with he and his son and his hospital computer chart and our office computer chart was reviewed and other than a little constipation he does not have any GI complaints but he thought he saw little bit of blood despite being legally blind and did have some melena at his doctor's office and his hemoglobin had dropped some from his baseline and he is not on any extra aspirin or nonsteroidals and his endoscopy in 2012 and colonoscopy in 2011 was reviewed and has not had any colon polyps and there is no family history of colon polyps or colon cancer and he has no other complaints  Past Medical History  Diagnosis Date  . ABDOMINAL AORTIC ANEURYSM 09/15/2008  . Atrial fibrillation ([place]) 10/10/2006  . Atrial flutter ([place]) 08/18/2006  . BENIGN PROSTATIC HYPERTROPHY, HX OF 08/18/2006  . COLONIC POLYPS, HX OF 08/18/2006  . HYPERLIPIDEMIA 12/04/2009  . HYPERTENSION 08/18/2006  . HYPOTENSION 09/04/2008  . MACULAR DEGENERATION 08/18/2006  . PEPTIC ULCER DISEASE, HX OF 08/18/2006  . RESTLESS LEGS SYNDROME 12/04/2009  . SLEEP APNEA 08/18/2006  . VERTIGO 07/28/2008  . WEAKNESS 06/23/2008  . CORONARY ARTERY DISEASE     non-ST segment MI, March 15, 2010  . Anemia January 2012    admitted with acute MI March 15, 2010 and has significant acute blood loss anemia requiring transfusions of two units of packed RBCs.  Status post upper endoscopy March 22, 2010 without High-Risk bleeding lesion.  History of peptic ulcer disease  . Myocardial infarction ([place]) 02/2010  . Leg pain   . AAA (abdominal aortic aneurysm) ([place])   . NSTEMI (non-ST elevated myocardial infarction) ([place]) 2010  . PAD (peripheral artery disease) ([place])   . PVD (peripheral vascular disease) ([place])   . History of Doppler  ultrasound 05/2011    h/o claudication, stable ABIs  . History of nuclear stress test 2007    persantine; normal, low risk     Past Surgical History  Procedure Laterality Date  . Inguinal hernia repair      Left inguinal  . Coronary artery bypass graft  12/1995    x5; LIMA to ALD, vein graft to RCA (now occluded),SVG to OM, SVG to diagonal  . Abdominal aortic aneurysm repair      stenting   . Cardiac catheterization  02/2010    r/t NSTEMI; loss of SVG to RCA  . Transthoracic echocardiogram  04/2011    EF 55-60%; mild LVH; grade 1 diastolic dysfunction    Family History  Problem Relation Age of Onset  . Heart disease Mother   . Heart disease Father   . Heart disease Sister   . Macular degeneration Sister   . Heart disease Sister   . Stroke Father   . Stroke Brother   . Hypertension Sister   . Heart Problems Sister     x2    Social History:  reports that he quit smoking about 27 years ago. His smoking use included Cigarettes. He has a 40 pack-year smoking history. He has never used smokeless tobacco. He reports that he does not drink alcohol or use illicit drugs.  Allergies:  Allergies  Allergen Reactions  . Cilostazol Other (See Comments)    Adverse reaction per patient  . Doxazosin Other (  See Comments)    INTOLERANCE     Medications: I have reviewed the patient's current medications.  Results for orders placed or performed during the hospital encounter of 01/27/15 (from the past 48 hour(s))  Type and screen [place]     Status: None (Preliminary result)   Collection Time: 01/27/15  5:15 PM  Result Value Ref Range   ABO/RH(D) A POS    Antibody Screen NEG    Sample Expiration 01/30/2015    Unit Number [id number]    Blood Component Type RBC LR PHER2    Unit division 00    Status of Unit ALLOCATED    Transfusion Status OK TO TRANSFUSE    Crossmatch Result Compatible   Comprehensive metabolic panel     Status: Abnormal   Collection Time:  01/27/15  5:23 PM  Result Value Ref Range   Sodium 137 135 - 145 mmol/L   Potassium 4.1 3.5 - 5.1 mmol/L   Chloride 103 101 - 111 mmol/L   CO2 24 22 - 32 mmol/L   Glucose, Bld 92 65 - 99 mg/dL   BUN 22 (H) 6 - 20 mg/dL   Creatinine, Ser 1.42 (H) 0.61 - 1.24 mg/dL   Calcium 9.4 8.9 - 10.3 mg/dL   Total Protein 7.1 6.5 - 8.1 g/dL   Albumin 3.6 3.5 - 5.0 g/dL   AST 23 15 - 41 U/L   ALT 23 17 - 63 U/L   Alkaline Phosphatase 75 38 - 126 U/L   Total Bilirubin 0.5 0.3 - 1.2 mg/dL   GFR calc non Af Amer 42 (L) >60 mL/min   GFR calc Af Amer 49 (L) >60 mL/min    Comment: (NOTE) The eGFR has been calculated using the CKD EPI equation. This calculation has not been validated in all clinical situations. eGFR's persistently <60 mL/min signify possible Chronic Kidney Disease.    Anion gap 10 5 - 15  CBC     Status: Abnormal   Collection Time: 01/27/15  5:23 PM  Result Value Ref Range   WBC 8.3 4.0 - 10.5 K/uL   RBC 3.71 (L) 4.22 - 5.81 MIL/uL   Hemoglobin 9.4 (L) 13.0 - 17.0 g/dL   HCT 31.2 (L) 39.0 - 52.0 %   MCV 84.1 78.0 - 100.0 fL   MCH 25.3 (L) 26.0 - 34.0 pg   MCHC 30.1 30.0 - 36.0 g/dL   RDW 18.8 (H) 11.5 - 15.5 %   Platelets 325 150 - 400 K/uL  Prepare RBC     Status: None   Collection Time: 01/27/15  9:13 PM  Result Value Ref Range   Order Confirmation ORDER PROCESSED BY BLOOD BANK   CBC     Status: Abnormal   Collection Time: 01/27/15 10:02 PM  Result Value Ref Range   WBC 6.6 4.0 - 10.5 K/uL   RBC 3.38 (L) 4.22 - 5.81 MIL/uL   Hemoglobin 8.8 (L) 13.0 - 17.0 g/dL   HCT 28.2 (L) 39.0 - 52.0 %   MCV 83.4 78.0 - 100.0 fL   MCH 26.0 26.0 - 34.0 pg   MCHC 31.2 30.0 - 36.0 g/dL   RDW 18.9 (H) 11.5 - 15.5 %   Platelets 289 150 - 400 K/uL  Brain natriuretic peptide     Status: Abnormal   Collection Time: 01/27/15 10:02 PM  Result Value Ref Range   B Natriuretic Peptide 272.0 (H) 0.0 - 100.0 pg/mL  Troponin I     Status: None  Collection Time: 01/27/15 10:02 PM  Result  Value Ref Range   Troponin I <0.03 <0.031 ng/mL    Comment:        NO INDICATION OF MYOCARDIAL INJURY.   Iron and TIBC     Status: Abnormal   Collection Time: 01/27/15 10:02 PM  Result Value Ref Range   Iron 18 (L) 45 - 182 ug/dL   TIBC 349 250 - 450 ug/dL   Saturation Ratios 5 (L) 17.9 - 39.5 %   UIBC 331 ug/dL  Ferritin     Status: Abnormal   Collection Time: 01/27/15 10:02 PM  Result Value Ref Range   Ferritin 21 (L) 24 - 336 ng/mL  Vitamin B12     Status: None   Collection Time: 01/27/15 10:02 PM  Result Value Ref Range   Vitamin B-12 386 180 - 914 pg/mL    Comment: (NOTE) This assay is not validated for testing neonatal or myeloproliferative syndrome specimens for Vitamin B12 levels.   CBC     Status: Abnormal   Collection Time: 01/28/15 12:45 AM  Result Value Ref Range   WBC 7.6 4.0 - 10.5 K/uL   RBC 3.36 (L) 4.22 - 5.81 MIL/uL   Hemoglobin 8.7 (L) 13.0 - 17.0 g/dL   HCT 27.8 (L) 39.0 - 52.0 %   MCV 82.7 78.0 - 100.0 fL   MCH 25.9 (L) 26.0 - 34.0 pg   MCHC 31.3 30.0 - 36.0 g/dL   RDW 18.6 (H) 11.5 - 15.5 %   Platelets 268 150 - 400 K/uL  CBC     Status: Abnormal   Collection Time: 01/28/15  5:37 AM  Result Value Ref Range   WBC 6.8 4.0 - 10.5 K/uL   RBC 3.58 (L) 4.22 - 5.81 MIL/uL   Hemoglobin 9.1 (L) 13.0 - 17.0 g/dL   HCT 30.3 (L) 39.0 - 52.0 %   MCV 84.6 78.0 - 100.0 fL   MCH 25.4 (L) 26.0 - 34.0 pg   MCHC 30.0 30.0 - 36.0 g/dL   RDW 18.9 (H) 11.5 - 15.5 %   Platelets 252 150 - 400 K/uL  Comprehensive metabolic panel     Status: Abnormal   Collection Time: 01/28/15  5:37 AM  Result Value Ref Range   Sodium 140 135 - 145 mmol/L   Potassium 3.8 3.5 - 5.1 mmol/L   Chloride 107 101 - 111 mmol/L   CO2 25 22 - 32 mmol/L   Glucose, Bld 110 (H) 65 - 99 mg/dL   BUN 21 (H) 6 - 20 mg/dL   Creatinine, Ser 1.31 (H) 0.61 - 1.24 mg/dL   Calcium 8.8 (L) 8.9 - 10.3 mg/dL   Total Protein 6.2 (L) 6.5 - 8.1 g/dL   Albumin 3.0 (L) 3.5 - 5.0 g/dL   AST 19 15 - 41 U/L    ALT 18 17 - 63 U/L   Alkaline Phosphatase 64 38 - 126 U/L   Total Bilirubin 0.4 0.3 - 1.2 mg/dL   GFR calc non Af Amer 47 (L) >60 mL/min   GFR calc Af Amer 54 (L) >60 mL/min    Comment: (NOTE) The eGFR has been calculated using the CKD EPI equation. This calculation has not been validated in all clinical situations. eGFR's persistently <60 mL/min signify possible Chronic Kidney Disease.    Anion gap 8 5 - 15  Protime-INR     Status: Abnormal   Collection Time: 01/28/15  5:37 AM  Result Value Ref Range   Prothrombin  Time 15.8 (H) 11.6 - 15.2 seconds   INR 1.25 0.00 - 1.49  APTT     Status: None   Collection Time: 01/28/15  5:37 AM  Result Value Ref Range   aPTT 32 24 - 37 seconds  CBC     Status: Abnormal   Collection Time: 01/28/15  8:47 AM  Result Value Ref Range   WBC 7.4 4.0 - 10.5 K/uL   RBC 3.71 (L) 4.22 - 5.81 MIL/uL   Hemoglobin 9.3 (L) 13.0 - 17.0 g/dL   HCT 31.1 (L) 39.0 - 52.0 %   MCV 83.8 78.0 - 100.0 fL   MCH 25.1 (L) 26.0 - 34.0 pg   MCHC 29.9 (L) 30.0 - 36.0 g/dL   RDW 18.9 (H) 11.5 - 15.5 %   Platelets 294 150 - 400 K/uL    No results found.  ROS negative except above Blood pressure 150/77, pulse 92, temperature 98 F (36.7 C), temperature source Oral, resp. rate 19, height _0  (1.803 m), weight 89.223 kg (196 lb 11.2 oz), SpO2 100 %. Physical Exam vital signs stable afebrile no acute distress exam please see preassessment evaluation labs reviewed  Assessment/Plan: Subacute GI bleeding in a patient on blood thinner for A. fib and up and coming cardioversion Plan: Okay to proceed with endoscopy which was discussed with the patient and his son with further workup and plans pending those findings  [place] E 01/28/2015, 2:33 PM

## 2015-01-28 NOTE — Evaluation (Signed)
Physical Therapy Evaluation Patient Details Name: Corey Mcdonald MRN: [id number] DOB: 1924/01/08 Today's Date: 01/28/2015   History of Present Illness  Corey Mcdonald is a 81 y.o. male with PMH of coronary artery disease status post CABG, peripheral arterial disease, and abdominal aortic aneurysm status post endovascular repair, and chronic atrial fibrillation on Elliquis who presents to the ED at the direction of his primary care physician for evaluation of melena and acute anemia in the setting of recent increase in Elliquis dose  Clinical Impression  Pt functioning near baseline. Pt legally blind but had a system and functions at mod I level. Daughter only assists with grocery shopping/driving and medicine management. Anticipate pt to be able to return home once medically stable. Son is in from [place] as well to provide assist as needed.    Follow Up Recommendations No PT follow up;Supervision - Intermittent    Equipment Recommendations  None recommended by PT    Recommendations for Other Services       Precautions / Restrictions Precautions Precautions: Other (comment) Precaution Comments: legally blind Restrictions Weight Bearing Restrictions: No      Mobility  Bed Mobility               General bed mobility comments: pt up in chair upon PT arrival  Transfers Overall transfer level: Needs assistance Equipment used: 1 person hand held assist Transfers: Sit to/from Stand Sit to Stand: Min guard         General transfer comment: assist due to impaired vision pt able to stand without physical assist  Ambulation/Gait Ambulation/Gait assistance: Min guard Ambulation Distance (Feet): 200 Feet Assistive device: 1 person hand held assist Gait Pattern/deviations: Step-through pattern;Decreased stride length (decreased step height) Gait velocity: decresaed   General Gait Details: min guard required due to impaired vision, otherwise pt amb without difficulty  Stairs             Wheelchair Mobility    Modified Rankin (Stroke Patients Only)       Balance Overall balance assessment: Needs assistance Sitting-balance support: Feet supported;No upper extremity supported Sitting balance-Leahy Scale: Good     Standing balance support: During functional activity Standing balance-Leahy Scale: Good Standing balance comment: pt stood and urinated without UE assist without LOB                             Pertinent Vitals/Pain Pain Assessment: No/denies pain    Home Living Family/patient expects to be discharged to:: Private residence Living Arrangements: Alone Available Help at Discharge: Family;Available PRN/intermittently Type of Home: Independent living facility Home Access: Level entry     Home Layout: One level Home Equipment: Grab bars - tub/shower;Hand held shower head Additional Comments: pt legally blind but does everything on own. does have staff that cleans place every other week.    Prior Function Level of Independence: Independent         Comments: daughter assists with grocery shopping and medicine management     Hand Dominance   Dominant Hand: Right    Extremity/Trunk Assessment   Upper Extremity Assessment: Overall WFL for tasks assessed           Lower Extremity Assessment: Overall WFL for tasks assessed      Cervical / Trunk Assessment: Normal  Communication   Communication: No difficulties  Cognition Arousal/Alertness: Awake/alert Behavior During Therapy: WFL for tasks assessed/performed Overall Cognitive Status: Within Functional Limits for tasks assessed  General Comments      Exercises        Assessment/Plan    PT Assessment Patient needs continued PT services  PT Diagnosis Difficulty walking;Generalized weakness   PT Problem List Decreased strength;Decreased activity tolerance;Decreased mobility;Decreased balance  PT Treatment Interventions DME  instruction;Gait training;Stair training;Functional mobility training;Therapeutic activities;Therapeutic exercise   PT Goals (Current goals can be found in the Care Plan section) Acute Rehab PT Goals Patient Stated Goal: home PT Goal Formulation: With patient/family Time For Goal Achievement: 02/11/15 Potential to Achieve Goals: Good    Frequency Min 3X/week   Barriers to discharge Decreased caregiver support lives alone    Co-evaluation               End of Session Equipment Utilized During Treatment: Gait belt Activity Tolerance: Patient tolerated treatment well Patient left: in chair;with call bell/phone within reach;with chair alarm set;with family/visitor present Nurse Communication: Mobility status         Time: [id number] PT Time Calculation (min) (ACUTE ONLY): 28 min   Charges:   PT Evaluation $Initial PT Evaluation Tier I: 1 Procedure PT Treatments $Gait Training: 8-22 mins   PT G CodesMarcene Brawn:        Braydon Kullman Marie 01/28/2015, 12:35 PM  Lewis ShockAshly Taiz Bickle, PT, DPT Pager #: [phone] Office #: [phone]

## 2015-01-29 ENCOUNTER — Encounter: Payer: Self-pay | Admitting: Gastroenterology

## 2015-01-29 ENCOUNTER — Telehealth: Payer: Self-pay | Admitting: Pulmonary Disease

## 2015-01-29 ENCOUNTER — Encounter (HOSPITAL_COMMUNITY): Payer: Self-pay | Admitting: Gastroenterology

## 2015-01-29 DIAGNOSIS — K922 Gastrointestinal hemorrhage, unspecified: Secondary | ICD-10-CM

## 2015-01-29 DIAGNOSIS — D62 Acute posthemorrhagic anemia: Secondary | ICD-10-CM

## 2015-01-29 LAB — FOLATE RBC
Folate, Hemolysate: >620 ng/mL
Folate, RBC: >2271 ng/mL (ref >498)
HEMATOCRIT: 27.3 % — AB (ref 37.5–51.0)

## 2015-01-29 LAB — TYPE AND SCREEN
ABO/RH(D): A POS
ANTIBODY SCREEN: NEGATIVE
UNIT DIVISION: 0

## 2015-01-29 LAB — HEMOGLOBIN AND HEMATOCRIT, BLOOD
HCT: 29.9 % — ABNORMAL LOW (ref 39.0–52.0)
Hemoglobin: 8.9 g/dL — ABNORMAL LOW (ref 13.0–17.0)

## 2015-01-29 NOTE — Discharge Summary (Signed)
Physician Discharge Summary  Corey Mcdonald OZH:[id number] DOB: 08-07-1923 DOA: 01/27/2015  PCP: Rogelia Boga, MD  Admit date: 01/27/2015 Discharge date: 01/29/2015  Time spent: greater than 30 minutes  Recommendations for Outpatient Follow-up:  1. Monitor h/h 2. Call Dr. Ewing Schlein if further bleeding. Would schedule colonoscopy if rebleed.  Discharge Diagnoses:    GI bleeding Active Problems:   Essential hypertension   Atrial fibrillation (HCC)   OSA on CPAP   Acute blood loss anemia   Chronic kidney disease  Discharge Condition: stable  Diet recommendation: heart healthy  Filed Weights   01/27/15 2128 01/28/15 0729 01/29/15 0449  Weight: 90.629 kg (199 lb 12.8 oz) 89.223 kg (196 lb 11.2 oz) 89.495 kg (197 lb 4.8 oz)    History of present illness:  81 y.o. male with PMH of coronary artery disease status post CABG, peripheral arterial disease, and abdominal aortic aneurysm status post endovascular repair, and chronic atrial fibrillation on Elliquis who presents to the ED at the direction of his primary care physician for evaluation of melena and acute anemia in the setting of recent increase in Eliquis dose. The patient is accompanied by his son, Suder, who assists with the history. The patient has been in his usual state of health and had his Eliquis dose increased last month from 2.5 to 5 mg twice a day in preparation for a scheduled cardioversion on 02/19/2015. On 01/24/2015, the patient notified his son that there was a red tinge to the toilet water after moving his bowels. Stool continued to be dark with blood tinged toilet water and so he saw his PCP today for evaluation of such. Of note, the patient had been completely asymptomatic, with no abdominal pain, diarrhea, malaise, chest pain, headache, or hematemesis. There is been no other easy bruising or bleeding. At the direction of his PCP, the patient was to hold the anticoagulant and increase his Protonix dose. Given the  medical complexity of the situation, with a bleeding patient with atrial fibrillation at high risk for CVA, as well as the acute anemia, the patient was directed to the ED by his PCP.  In ED, patient was found to have a hemoglobin of 9.4, down from 10.4 one month prior, and down from an apparent baseline of 12. The patient remained hemodynamically stable and with O2 saturation close to 100% on room air in the emergency department and was admitted for ongoing evaluation and management of blood loss anemia in an anticoagulated patient with chronic atrial fibrillation.   Hospital Course:  Admitted to telemetry. PPI continued. eliquis held. GI consulted and EGD showed no bleeding.  Has had no evidence of bleeding while in hospital. H/h and blood pressure stable. GI recommends resuming eliquis and stable for discharge.   Patient will contact Dr. Ewing Schlein if bleeding returns, at which time Dr. Ewing Schlein will schedule colonoscopy as outpatient.  Discussed with cardiologist, Dr. Rennis Golden, per son's request. He recommends continuing same eliquis dose and will reschedule DCCV so patient will have uninterrupted anticoagulation for 30 days.  Procedures:  EGD  Consultations:  [facility]  Discharge Exam: Filed Vitals:   01/29/15 0026 01/29/15 0449  BP: 134/69 152/71  Pulse: 104 111  Temp: 98.5 F (36.9 C) 97.5 F (36.4 C)  Resp: 18 20    General: a and o Cardiovascular: irreg irreg Respiratory: CTA  Discharge Instructions   Discharge Instructions    Diet - low sodium heart healthy    Complete by:  As directed  Increase activity slowly    Complete by:  As directed           Current Discharge Medication List    CONTINUE these medications which have NOT CHANGED   Details  acetaminophen (TYLENOL) 325 MG tablet Take 650 mg by mouth every 6 (six) hours as needed for moderate pain.     apixaban (ELIQUIS) 5 MG TABS tablet Take 1 tablet (5 mg total) by mouth 2 (two) times daily. Qty: 60 tablet,  Refills: 3    atorvastatin (LIPITOR) 20 MG tablet Take 1 tablet (20 mg total) by mouth daily. Qty: 90 tablet, Refills: 3    docusate sodium (COLACE) 100 MG capsule Take 200 mg by mouth at bedtime.     metoprolol tartrate (LOPRESSOR) 25 MG tablet TAKE ONE TABLET BY MOUTH TWICE DAILY. Qty: 60 tablet, Refills: 1    Multiple Vitamin (MULTIVITAMIN) tablet Take 1 tablet by mouth daily.     nitroGLYCERIN (NITROSTAT) 0.4 MG SL tablet Place 1 tablet (0.4 mg total) under the tongue every 5 (five) minutes as needed. FOR CHEST PAIN Qty: 25 tablet, Refills: 6    pantoprazole (PROTONIX) 40 MG tablet TAKE ONE TABLET BY MOUTH ONCE DAILY Qty: 60 tablet, Refills: 0    tamsulosin (FLOMAX) 0.4 MG CAPS capsule TAKE ONE CAPSULE BY MOUTH ONCE DAILY Qty: 30 capsule, Refills: 5       Allergies  Allergen Reactions  . Cilostazol Other (See Comments)    Adverse reaction per patient  . Doxazosin Other (See Comments)    INTOLERANCE    Follow-up Information    Follow up with Rogelia Boga, MD In 1 week.   Specialty:  Internal Medicine   Why:  to check hemoglobin   Contact information:   [address] Christena Flake [facility] [place] [address] [phone]        The results of significant diagnostics from this hospitalization (including imaging, microbiology, ancillary and laboratory) are listed below for reference.    Significant Diagnostic Studies: No results found.  Microbiology: No results found for this or any previous visit (from the past 240 hour(s)).   Labs: Basic Metabolic Panel:  Recent Labs Lab 01/27/15 1723 01/28/15 0537  NA 137 140  K 4.1 3.8  CL 103 107  CO2 24 25  GLUCOSE 92 110*  BUN 22* 21*  CREATININE 1.42* 1.31*  CALCIUM 9.4 8.8*   Liver Function Tests:  Recent Labs Lab 01/27/15 1723 01/28/15 0537  AST 23 19  ALT 23 18  ALKPHOS 75 64  BILITOT 0.5 0.4  PROT 7.1 6.2*  ALBUMIN 3.6 3.0*   No results for input(s): LIPASE, AMYLASE in the last 168 hours. No  results for input(s): AMMONIA in the last 168 hours. CBC:  Recent Labs Lab 01/27/15 1723 01/27/15 2202 01/28/15 0045 01/28/15 0537 01/28/15 0847 01/29/15 0425  WBC 8.3 6.6 7.6 6.8 7.4  --   HGB 9.4* 8.8* 8.7* 9.1* 9.3* 8.9*  HCT 31.2* 28.2* 27.8* 30.3* 31.1* 29.9*  MCV 84.1 83.4 82.7 84.6 83.8  --   PLT 325 289 268 252 294  --    Cardiac Enzymes:  Recent Labs Lab 01/27/15 2202  TROPONINI <0.03   BNP: BNP (last 3 results)  Recent Labs  01/27/15 2202  BNP 272.0*    ProBNP (last 3 results) No results for input(s): PROBNP in the last 8760 hours.  CBG: No results for input(s): GLUCAP in the last 168 hours.     Signed:  Christiane Ha  [facility]  01/29/2015, 8:59 AM

## 2015-01-29 NOTE — Telephone Encounter (Signed)
Spoke with pt son. He is calling checking in status of order placed 11/30.  [facility] staff message to check on this. Will await response back

## 2015-01-29 NOTE — Progress Notes (Signed)
Corey Mcdonald 11:10 AM  Subjective: Patient seen and examined and no signs of bleeding and no problems from his endoscopy and no new complaints and is going home  Objective: Vital signs stable afebrile no acute distress abdomen is soft nontender hemoglobin stable  Assessment: GI bleed questionable etiology  Plan: Happy to see back when necessary and will plan colonoscopy if any further GI bleeding and patient agrees  [name],Corey Mcdonald  Pager [phone] After 5PM or if no answer call [phone]

## 2015-01-29 NOTE — Discharge Summary (Signed)
Pt got discharged, discharge instructions provided to the patient and his Son and patient showed understanding to it, IV taken out,Telemonitor DC,pt left unit in wheelchair with all of the belongings accompanied with his Son.

## 2015-01-29 NOTE — Telephone Encounter (Signed)
Per Melissa with [facility]: We tried to call him but his number were disconnected and not able to leave a message. We mailed a letter out.  I have forwarded the phone number that is listed on the tel enc from today to my resp team. We'll try that one.  Thanks   I called and spoke with Corey Mcdonald and made aware. Nothing further needed

## 2015-01-29 NOTE — Progress Notes (Signed)
Pt has home CPAP through [employer] but he and son state [facility] is now refusing to provide supplies for machine.  Dr Vassie LollAlva with [facility] just recently completed another sleep study and ordered new supplies - son unsure of vendor.  Son states supplies were to have been delivered last Friday but were not - advised him to call Dr Reginia NaasAlva's office and report supplies had not been received yet.  Dr Vassie LollAlva was also going to determine if new CPAP machine would be covered by insurance, agreed to order if it would be covered.  Son will also discuss that with Dr Reginia NaasAlva's office staff.

## 2015-01-29 NOTE — Progress Notes (Signed)
Utilization review completed. Carvel Huskins, RN, BSN. 

## 2015-01-30 ENCOUNTER — Telehealth: Payer: Self-pay | Admitting: Internal Medicine

## 2015-01-30 NOTE — Telephone Encounter (Signed)
I did speak with the hospital medicine doctor. I was aware of the GI bleeding. We will need to cancel cardioversion for 12/28 as he was off of Eliquis for 24 hours due to bleeding. This resets the clock and he would need another month of anticoagulation. No source of bleeding was found. He needs to be on low dose aspirin 81 mg daily as well due to CAD and underlying CABG. Please re-schedule follow-up with me in late January to early February and we will re-visit cardioversion or possibly leaving him in a-fib at this point if he is asymptomatic.  Dr. HRexene Edison

## 2015-01-30 NOTE — Telephone Encounter (Signed)
Spoke to son Patient had several question concerning patient recent discharge from hospital Patient had a blood in stool- GI  ENDOSCOPY Son was under the impression his father was not taking aspirin 81 mg ,but sister who does the medication has been giving aspirin 81 mg  Along with eliquis 5 mg. Son states previous cardioversion was cancelled the Elquis was increase to 5 mg from 2.5 mg.  QUESTION 1) Is patient still schedule for cardioversion 02/18/15? 2) Should patient still continue with Eliquis 5 mg and 81 mg or stop aspirin? Even if cardioversion is cancelled?  3) IF Dr Rennis GoldenHilty was aware patient was in the hospital?   son aware will defer to Dr Rennis GoldenHilty?

## 2015-01-30 NOTE — Telephone Encounter (Signed)
Please call,concerning his medicine(Eliquis and Aspirin).

## 2015-01-30 NOTE — Telephone Encounter (Signed)
Spoke to son   information given, cardioversion is cancelled Son is aware to continue with Eliquis and aspirin. Appointment schedule for 03/19/14 8:15 am Son states he will let Dr  Lawerance BachMagod(GI) be aware.

## 2015-01-30 NOTE — Telephone Encounter (Signed)
CALLED NO ANSWER WILL TRY AGAIN

## 2015-02-02 ENCOUNTER — Inpatient Hospital Stay (HOSPITAL_COMMUNITY)
Admission: EM | Admit: 2015-02-02 | Discharge: 2015-02-06 | DRG: 378 | Disposition: A | Discharge status: Skilled Nursing Facility | Payer: Medicare Other | Attending: Internal Medicine | Admitting: Internal Medicine

## 2015-02-02 DIAGNOSIS — Q2733 Arteriovenous malformation of digestive system vessel: Secondary | ICD-10-CM

## 2015-02-02 DIAGNOSIS — E785 Hyperlipidemia, unspecified: Secondary | ICD-10-CM | POA: Diagnosis present

## 2015-02-02 DIAGNOSIS — Z888 Allergy status to other drugs, medicaments and biological substances status: Secondary | ICD-10-CM | POA: Diagnosis not present

## 2015-02-02 DIAGNOSIS — I2510 Atherosclerotic heart disease of native coronary artery without angina pectoris: Secondary | ICD-10-CM | POA: Diagnosis present

## 2015-02-02 DIAGNOSIS — N183 Chronic kidney disease, stage 3 (moderate): Secondary | ICD-10-CM | POA: Diagnosis present

## 2015-02-02 DIAGNOSIS — I252 Old myocardial infarction: Secondary | ICD-10-CM

## 2015-02-02 DIAGNOSIS — I482 Chronic atrial fibrillation, unspecified: Secondary | ICD-10-CM | POA: Diagnosis present

## 2015-02-02 DIAGNOSIS — I4891 Unspecified atrial fibrillation: Secondary | ICD-10-CM | POA: Diagnosis present

## 2015-02-02 DIAGNOSIS — K635 Polyp of colon: Secondary | ICD-10-CM | POA: Diagnosis present

## 2015-02-02 DIAGNOSIS — K921 Melena: Secondary | ICD-10-CM | POA: Diagnosis present

## 2015-02-02 DIAGNOSIS — K449 Diaphragmatic hernia without obstruction or gangrene: Secondary | ICD-10-CM | POA: Diagnosis present

## 2015-02-02 DIAGNOSIS — I130 Hypertensive heart and chronic kidney disease with heart failure and stage 1 through stage 4 chronic kidney disease, or unspecified chronic kidney disease: Secondary | ICD-10-CM | POA: Diagnosis present

## 2015-02-02 DIAGNOSIS — N400 Enlarged prostate without lower urinary tract symptoms: Secondary | ICD-10-CM | POA: Diagnosis present

## 2015-02-02 DIAGNOSIS — D62 Acute posthemorrhagic anemia: Secondary | ICD-10-CM | POA: Diagnosis present

## 2015-02-02 DIAGNOSIS — I739 Peripheral vascular disease, unspecified: Secondary | ICD-10-CM | POA: Diagnosis present

## 2015-02-02 DIAGNOSIS — K922 Gastrointestinal hemorrhage, unspecified: Secondary | ICD-10-CM

## 2015-02-02 DIAGNOSIS — I480 Paroxysmal atrial fibrillation: Secondary | ICD-10-CM | POA: Diagnosis present

## 2015-02-02 DIAGNOSIS — I5032 Chronic diastolic (congestive) heart failure: Secondary | ICD-10-CM | POA: Diagnosis present

## 2015-02-02 DIAGNOSIS — I959 Hypotension, unspecified: Secondary | ICD-10-CM | POA: Diagnosis present

## 2015-02-02 DIAGNOSIS — I714 Abdominal aortic aneurysm, without rupture: Secondary | ICD-10-CM | POA: Diagnosis present

## 2015-02-02 DIAGNOSIS — G4733 Obstructive sleep apnea (adult) (pediatric): Secondary | ICD-10-CM | POA: Diagnosis present

## 2015-02-02 DIAGNOSIS — Z7982 Long term (current) use of aspirin: Secondary | ICD-10-CM

## 2015-02-02 DIAGNOSIS — K5730 Diverticulosis of large intestine without perforation or abscess without bleeding: Secondary | ICD-10-CM | POA: Diagnosis present

## 2015-02-02 DIAGNOSIS — Z87891 Personal history of nicotine dependence: Secondary | ICD-10-CM

## 2015-02-02 DIAGNOSIS — H3530 Unspecified macular degeneration: Secondary | ICD-10-CM | POA: Diagnosis present

## 2015-02-02 DIAGNOSIS — K648 Other hemorrhoids: Secondary | ICD-10-CM | POA: Diagnosis present

## 2015-02-02 DIAGNOSIS — Z8711 Personal history of peptic ulcer disease: Secondary | ICD-10-CM

## 2015-02-02 DIAGNOSIS — E872 Acidosis, unspecified: Secondary | ICD-10-CM | POA: Diagnosis present

## 2015-02-02 DIAGNOSIS — Z79899 Other long term (current) drug therapy: Secondary | ICD-10-CM

## 2015-02-02 DIAGNOSIS — I10 Essential (primary) hypertension: Secondary | ICD-10-CM | POA: Diagnosis present

## 2015-02-02 DIAGNOSIS — Z951 Presence of aortocoronary bypass graft: Secondary | ICD-10-CM

## 2015-02-02 DIAGNOSIS — Z9989 Dependence on other enabling machines and devices: Secondary | ICD-10-CM | POA: Diagnosis present

## 2015-02-02 DIAGNOSIS — Z7901 Long term (current) use of anticoagulants: Secondary | ICD-10-CM

## 2015-02-02 DIAGNOSIS — N179 Acute kidney failure, unspecified: Secondary | ICD-10-CM | POA: Diagnosis present

## 2015-02-02 DIAGNOSIS — I5030 Unspecified diastolic (congestive) heart failure: Secondary | ICD-10-CM | POA: Diagnosis present

## 2015-02-02 DIAGNOSIS — G2581 Restless legs syndrome: Secondary | ICD-10-CM | POA: Diagnosis present

## 2015-02-02 DIAGNOSIS — I481 Persistent atrial fibrillation: Secondary | ICD-10-CM | POA: Diagnosis not present

## 2015-02-02 DIAGNOSIS — N189 Chronic kidney disease, unspecified: Secondary | ICD-10-CM | POA: Diagnosis present

## 2015-02-02 LAB — COMPREHENSIVE METABOLIC PANEL
ALT: 27 U/L (ref 17–63)
AST: 28 U/L (ref 15–41)
Albumin: 3.2 g/dL — ABNORMAL LOW (ref 3.5–5.0)
Alkaline Phosphatase: 63 U/L (ref 38–126)
Anion gap: 12 (ref 5–15)
BUN: 28 mg/dL — ABNORMAL HIGH (ref 6–20)
CHLORIDE: 108 mmol/L (ref 101–111)
CO2: 17 mmol/L — AB (ref 22–32)
CREATININE: 1.49 mg/dL — AB (ref 0.61–1.24)
Calcium: 8.8 mg/dL — ABNORMAL LOW (ref 8.9–10.3)
GFR calc non Af Amer: 40 mL/min — ABNORMAL LOW (ref >60)
GFR, EST AFRICAN AMERICAN: 46 mL/min — AB (ref >60)
Glucose, Bld: 110 mg/dL — ABNORMAL HIGH (ref 65–99)
Potassium: 4.6 mmol/L (ref 3.5–5.1)
SODIUM: 137 mmol/L (ref 135–145)
Total Bilirubin: 0.7 mg/dL (ref 0.3–1.2)
Total Protein: 6.6 g/dL (ref 6.5–8.1)

## 2015-02-02 LAB — I-STAT CHEM 8, ED
BUN: 30 mg/dL — AB (ref 6–20)
CHLORIDE: 106 mmol/L (ref 101–111)
CREATININE: 1.4 mg/dL — AB (ref 0.61–1.24)
Calcium, Ion: 1.09 mmol/L — ABNORMAL LOW (ref 1.13–1.30)
GLUCOSE: 130 mg/dL — AB (ref 65–99)
HCT: 25 % — ABNORMAL LOW (ref 39.0–52.0)
Hemoglobin: 8.5 g/dL — ABNORMAL LOW (ref 13.0–17.0)
POTASSIUM: 5 mmol/L (ref 3.5–5.1)
Sodium: 138 mmol/L (ref 135–145)
TCO2: 21 mmol/L (ref 0–100)

## 2015-02-02 LAB — CBC WITH DIFFERENTIAL/PLATELET
Basophils Absolute: 0 10*3/uL (ref 0.0–0.1)
Basophils Relative: 0 %
EOS ABS: 0.1 10*3/uL (ref 0.0–0.7)
Eosinophils Relative: 1 %
HEMATOCRIT: 22.5 % — AB (ref 39.0–52.0)
HEMOGLOBIN: 6.9 g/dL — AB (ref 13.0–17.0)
LYMPHS ABS: 0.6 10*3/uL — AB (ref 0.7–4.0)
Lymphocytes Relative: 6 %
MCH: 25.7 pg — AB (ref 26.0–34.0)
MCHC: 30.7 g/dL (ref 30.0–36.0)
MCV: 83.6 fL (ref 78.0–100.0)
MONOS PCT: 8 %
Monocytes Absolute: 0.7 10*3/uL (ref 0.1–1.0)
NEUTROS PCT: 85 %
Neutro Abs: 8 10*3/uL — ABNORMAL HIGH (ref 1.7–7.7)
Platelets: 298 10*3/uL (ref 150–400)
RBC: 2.69 MIL/uL — ABNORMAL LOW (ref 4.22–5.81)
RDW: 18.5 % — ABNORMAL HIGH (ref 11.5–15.5)
WBC: 9.4 10*3/uL (ref 4.0–10.5)

## 2015-02-02 LAB — PREPARE RBC (CROSSMATCH)

## 2015-02-02 LAB — APTT: aPTT: 32 seconds (ref 24–37)

## 2015-02-02 LAB — I-STAT CG4 LACTIC ACID, ED: Lactic Acid, Venous: 2.41 mmol/L (ref 0.5–2.0)

## 2015-02-02 LAB — PROTIME-INR
INR: 1.58 — AB (ref 0.00–1.49)
Prothrombin Time: 18.9 seconds — ABNORMAL HIGH (ref 11.6–15.2)

## 2015-02-02 MED ORDER — SODIUM CHLORIDE 0.9 % IV SOLN
Freq: Once | INTRAVENOUS | Status: AC
  Administered 2015-02-02: 22:00:00 via INTRAVENOUS

## 2015-02-02 MED ORDER — SODIUM CHLORIDE 0.9 % IV SOLN
80.0000 mg | Freq: Once | INTRAVENOUS | Status: AC
  Administered 2015-02-03: 80 mg via INTRAVENOUS
  Filled 2015-02-02: qty 80

## 2015-02-02 MED ORDER — SODIUM CHLORIDE 0.9 % IV SOLN
Freq: Once | INTRAVENOUS | Status: AC
  Administered 2015-02-03: 02:00:00 via INTRAVENOUS

## 2015-02-02 MED ORDER — PANTOPRAZOLE SODIUM 40 MG IV SOLR: 40.0000 mg | Freq: Two times a day (BID) | INTRAVENOUS | Status: DC

## 2015-02-02 MED ORDER — SODIUM CHLORIDE 0.9 % IV SOLN
8.0000 mg/h | INTRAVENOUS | Status: DC
  Administered 2015-02-03 – 2015-02-04 (×4): 8 mg/h via INTRAVENOUS
  Filled 2015-02-02 (×9): qty 80

## 2015-02-02 NOTE — ED Provider Notes (Signed)
CSN: [id number]     Arrival date & time 02/02/15  2129 History   First MD Initiated Contact with Patient 02/02/15 2136     Chief Complaint  Patient presents with  . Hypotension  . Altered Mental Status     (Consider location/radiation/quality/duration/timing/severity/associated sxs/prior Treatment) HPI  Patient is an 81 year old male with past medical history significant for coronary artery disease s/p CABG, AAA s/p repair, afib (Eliquis), recent GI bleed, who presents to the emergency department with GI bleeding. Patient was recently discharged from the hospital on 11/29/14 following a GI bleed, patient was continued on 5 mg Eliquis and aspirin 81 daily due to his scheduled cardioversion for A. fib on 02/19/15. Per the family patient was in his normal state until earlier tonight when in the middle of dinner he became "unresponsive". States that he stared off into space and was not responding appropriately to questions. The family members checked his blood pressure found to be 80/60s. EMS was called and brought the patient to the emergency department. Patient is complaining of generalized weakness and fatigue. Reports dark red blood per rectum. Denies headache, chest pain, shortness of breath, abdominal pain, nausea, vomiting. His son states that they were evaluated by endocrinology earlier today, they checked his hemoglobin which was 8.5, stated that if his hemoglobin dropped anymore that they would admit him and obtain colonoscopy as an inpatient.  Past Medical History  Diagnosis Date  . ABDOMINAL AORTIC ANEURYSM 09/15/2008  . Atrial fibrillation (HCC) 10/10/2006  . Atrial flutter (HCC) 08/18/2006  . BENIGN PROSTATIC HYPERTROPHY, HX OF 08/18/2006  . COLONIC POLYPS, HX OF 08/18/2006  . HYPERLIPIDEMIA 12/04/2009  . HYPERTENSION 08/18/2006  . HYPOTENSION 09/04/2008  . MACULAR DEGENERATION 08/18/2006  . PEPTIC ULCER DISEASE, HX OF 08/18/2006  . RESTLESS LEGS SYNDROME 12/04/2009  . SLEEP APNEA  08/18/2006  . VERTIGO 07/28/2008  . WEAKNESS 06/23/2008  . CORONARY ARTERY DISEASE     non-ST segment MI, March 15, 2010  . Anemia January 2012    admitted with acute MI March 15, 2010 and has significant acute blood loss anemia requiring transfusions of two units of packed RBCs.  Status post upper endoscopy March 22, 2010 without High-Risk bleeding lesion.  History of peptic ulcer disease  . Myocardial infarction (HCC) 02/2010  . Leg pain   . AAA (abdominal aortic aneurysm) (HCC)   . NSTEMI (non-ST elevated myocardial infarction) (HCC) 2010  . PAD (peripheral artery disease) (HCC)   . PVD (peripheral vascular disease) (HCC)   . History of Doppler ultrasound 05/2011    h/o claudication, stable ABIs  . History of nuclear stress test 2007    persantine; normal, low risk    Past Surgical History  Procedure Laterality Date  . Inguinal hernia repair      Left inguinal  . Coronary artery bypass graft  12/1995    x5; LIMA to ALD, vein graft to RCA (now occluded),SVG to OM, SVG to diagonal  . Abdominal aortic aneurysm repair      stenting   . Cardiac catheterization  02/2010    r/t NSTEMI; loss of SVG to RCA  . Transthoracic echocardiogram  04/2011    EF 55-60%; mild LVH; grade 1 diastolic dysfunction  . Esophagogastroduodenoscopy N/A 01/28/2015    Procedure: ESOPHAGOGASTRODUODENOSCOPY (EGD);  Surgeon: Vida Rigger, MD;  Location: [facility] ENDOSCOPY;  Service: Endoscopy;  Laterality: N/A;   Family History  Problem Relation Age of Onset  . Heart disease Mother   . Heart disease Father   .  Heart disease Sister   . Macular degeneration Sister   . Heart disease Sister   . Stroke Father   . Stroke Brother   . Hypertension Sister   . Heart Problems Sister     x2   Social History  Substance Use Topics  . Smoking status: Former Smoker -- 1.00 packs/day for 40 years    Types: Cigarettes    Quit date: 02/22/1987  . Smokeless tobacco: Never Used  . Alcohol Use: No    Review of Systems   Constitutional: Positive for fatigue. Negative for fever and appetite change.  HENT: Negative for congestion.   Eyes: Negative for visual disturbance.  Respiratory: Negative for chest tightness and shortness of breath.   Cardiovascular: Negative for chest pain and palpitations.  Gastrointestinal: Positive for blood in stool. Negative for nausea, vomiting and abdominal pain.  Genitourinary: Negative for dysuria, hematuria, flank pain and decreased urine volume.  Musculoskeletal: Negative for back pain.  Skin: Negative for rash.  Neurological: Positive for light-headedness. Negative for dizziness, seizures, syncope, speech difficulty, weakness and headaches.  Psychiatric/Behavioral: Positive for confusion. Negative for behavioral problems.      Allergies  Cilostazol and Doxazosin  Home Medications   Prior to Admission medications   Medication Sig Start Date End Date Taking? Authorizing Provider  acetaminophen (TYLENOL) 325 MG tablet Take 650 mg by mouth every 6 (six) hours as needed for moderate pain.    Yes Historical Provider, MD  apixaban (ELIQUIS) 5 MG TABS tablet Take 1 tablet (5 mg total) by mouth 2 (two) times daily. 01/02/15  Yes Quintella Reichertraci R Turner, MD  aspirin EC 81 MG tablet Take 81 mg by mouth daily.   Yes Historical Provider, MD  atorvastatin (LIPITOR) 20 MG tablet Take 1 tablet (20 mg total) by mouth daily. Patient taking differently: Take 20 mg by mouth daily at 6 PM.  02/28/14  Yes Gordy SaversPeter F Kwiatkowski, MD  docusate sodium (COLACE) 100 MG capsule Take 200 mg by mouth at bedtime.    Yes Historical Provider, MD  metoprolol tartrate (LOPRESSOR) 25 MG tablet TAKE ONE TABLET BY MOUTH TWICE DAILY. 01/19/15  Yes Chrystie NoseKenneth C Hilty, MD  Multiple Vitamin (MULTIVITAMIN) tablet Take 1 tablet by mouth daily.    Yes Historical Provider, MD  nitroGLYCERIN (NITROSTAT) 0.4 MG SL tablet Place 1 tablet (0.4 mg total) under the tongue every 5 (five) minutes as needed. FOR CHEST PAIN 01/20/15  Yes  Chrystie NoseKenneth C Hilty, MD  pantoprazole (PROTONIX) 40 MG tablet TAKE ONE TABLET BY MOUTH ONCE DAILY Patient taking differently: TAKE 40 MG BY MOUTH TWICE DAILY 12/18/14  Yes Chrystie NoseKenneth C Hilty, MD  tamsulosin (FLOMAX) 0.4 MG CAPS capsule TAKE ONE CAPSULE BY MOUTH ONCE DAILY Patient taking differently: TAKE ONE CAPSULE BY MOUTH ONCE DAILY AT BEDTIME 01/19/15  Yes Gordy SaversPeter F Kwiatkowski, MD   BP 126/87 mmHg  Pulse 109  Temp(Src) 97.7 F (36.5 C) (Oral)  Resp 20  Ht 5\' 11"  (1.803 m)  Wt 91.1 kg  BMI 28.02 kg/m2  SpO2 97% Physical Exam  Constitutional: He is oriented to person, place, and time. He appears well-developed and well-nourished. No distress.  HENT:  Head: Atraumatic.  Mouth/Throat: Oropharynx is clear and moist.  Eyes: Conjunctivae and EOM are normal. Pupils are equal, round, and reactive to light.  Neck: Normal range of motion. No JVD present. No tracheal deviation present.  Cardiovascular: Normal heart sounds and intact distal pulses.  An irregularly irregular rhythm present. Tachycardia present.   Pulmonary/Chest: Effort  normal and breath sounds normal. No respiratory distress. He has no wheezes.  Abdominal: Soft. He exhibits no distension. There is no tenderness. There is no rebound and no guarding.  Genitourinary:  Dark red blood per rectum  Musculoskeletal: Normal range of motion. He exhibits no edema.  Neurological: He is alert and oriented to person, place, and time.  Skin: Skin is warm. There is pallor.  Psychiatric: He has a normal mood and affect.    ED Course  Procedures (including critical care time) Labs Review Labs Reviewed  COMPREHENSIVE METABOLIC PANEL - Abnormal; Notable for the following:    CO2 17 (*)    Glucose, Bld 110 (*)    BUN 28 (*)    Creatinine, Ser 1.49 (*)    Calcium 8.8 (*)    Albumin 3.2 (*)    GFR calc non Af Amer 40 (*)    GFR calc Af Amer 46 (*)    All other components within normal limits  CBC WITH DIFFERENTIAL/PLATELET - Abnormal; Notable  for the following:    RBC 2.69 (*)    Hemoglobin 6.9 (*)    HCT 22.5 (*)    MCH 25.7 (*)    RDW 18.5 (*)    Neutro Abs 8.0 (*)    Lymphs Abs 0.6 (*)    All other components within normal limits  PROTIME-INR - Abnormal; Notable for the following:    Prothrombin Time 18.9 (*)    INR 1.58 (*)    All other components within normal limits  I-STAT CHEM 8, ED - Abnormal; Notable for the following:    BUN 30 (*)    Creatinine, Ser 1.40 (*)    Glucose, Bld 130 (*)    Calcium, Ion 1.09 (*)    Hemoglobin 8.5 (*)    HCT 25.0 (*)    All other components within normal limits  I-STAT CG4 LACTIC ACID, ED - Abnormal; Notable for the following:    Lactic Acid, Venous 2.41 (*)    All other components within normal limits  MRSA PCR SCREENING  APTT  HEMOGLOBIN  HEMOGLOBIN  CBC WITH DIFFERENTIAL/PLATELET  CBC WITH DIFFERENTIAL/PLATELET  COMPREHENSIVE METABOLIC PANEL  PROTIME-INR  TYPE AND SCREEN  PREPARE RBC (CROSSMATCH)    Imaging Review No results found. I have personally reviewed and evaluated these images and lab results as part of my medical decision-making.   EKG Interpretation None      MDM   Final diagnoses:  None   Patient is an 81 year old male with past medical history significant for recent GI bleed, A. fib on Eliquis and aspirin 81, who presents to the emergency department with GI bleed. On arrival, appears pale, in obvious distress. Afebrile, BP 130s/80s, HR 110s. Exam as above, notable for pale appearance, dark red blood per rectum, benign abdominal exam, intact distal pulses, cold extremities. AAO x3.   Patient was immediately started on IV fluids, i-STAT chem 8 and type and screen obtained. Hemoglobin 6.9 (down from 8.5 earlier today). Lactic acid 2.4. Platelets 298. Patient was typed and crossed for 2 units PRBCs. Patient hemodynamically stable at this time will not give Kcentra. Will hold all anticoagulation and antiplatelet agents. Patient admitted to the hospitalist  service, Dr. Robb Matar, will admit to stepdown bed for lower GI bleed.    Corena Herter, MD 02/03/15 2130  Gerhard Munch, MD 02/06/15 [phone]

## 2015-02-02 NOTE — ED Notes (Signed)
Consent signed.

## 2015-02-02 NOTE — ED Notes (Signed)
Pt states that he has had some dark stools, not sure when he last one was.  Pt had endoscopy last week.

## 2015-02-02 NOTE — ED Notes (Signed)
Per [facility]: Pt is from [facility], pt has been under treatment for a bleed, not sure where it is right now. Family called out due to altered mental status, normally fully alert and oriented, pt found in a chair just staring off. Mental status improved when pt laid down.  Family was getting BP at home of 80/60.  Pt shivering at this time, cold to touch, and pale.

## 2015-02-02 NOTE — ED Notes (Signed)
Pts hands warm to touch now. Capillary refill less than 3 seconds.

## 2015-02-02 NOTE — ED Notes (Signed)
Family at bedside. 

## 2015-02-03 ENCOUNTER — Telehealth: Payer: Self-pay | Admitting: Internal Medicine

## 2015-02-03 ENCOUNTER — Encounter (HOSPITAL_COMMUNITY): Payer: Self-pay | Admitting: Family Medicine

## 2015-02-03 DIAGNOSIS — N179 Acute kidney failure, unspecified: Secondary | ICD-10-CM | POA: Diagnosis present

## 2015-02-03 DIAGNOSIS — I10 Essential (primary) hypertension: Secondary | ICD-10-CM

## 2015-02-03 DIAGNOSIS — I482 Chronic atrial fibrillation, unspecified: Secondary | ICD-10-CM | POA: Diagnosis present

## 2015-02-03 DIAGNOSIS — E872 Acidosis, unspecified: Secondary | ICD-10-CM | POA: Diagnosis present

## 2015-02-03 DIAGNOSIS — G4733 Obstructive sleep apnea (adult) (pediatric): Secondary | ICD-10-CM

## 2015-02-03 DIAGNOSIS — I5030 Unspecified diastolic (congestive) heart failure: Secondary | ICD-10-CM | POA: Diagnosis present

## 2015-02-03 DIAGNOSIS — I5032 Chronic diastolic (congestive) heart failure: Secondary | ICD-10-CM

## 2015-02-03 DIAGNOSIS — D62 Acute posthemorrhagic anemia: Secondary | ICD-10-CM

## 2015-02-03 DIAGNOSIS — N400 Enlarged prostate without lower urinary tract symptoms: Secondary | ICD-10-CM

## 2015-02-03 LAB — CBC WITH DIFFERENTIAL/PLATELET
BASOS ABS: 0 10*3/uL (ref 0.0–0.1)
BASOS PCT: 0 %
Eosinophils Absolute: 0.1 10*3/uL (ref 0.0–0.7)
Eosinophils Relative: 1 %
HEMATOCRIT: 26.6 % — AB (ref 39.0–52.0)
HEMOGLOBIN: 8.2 g/dL — AB (ref 13.0–17.0)
LYMPHS ABS: 1.2 10*3/uL (ref 0.7–4.0)
Lymphocytes Relative: 18 %
MCH: 25.9 pg — ABNORMAL LOW (ref 26.0–34.0)
MCHC: 30.8 g/dL (ref 30.0–36.0)
MCV: 84.2 fL (ref 78.0–100.0)
MONOS PCT: 10 %
Monocytes Absolute: 0.7 10*3/uL (ref 0.1–1.0)
NEUTROS PCT: 71 %
Neutro Abs: 4.9 10*3/uL (ref 1.7–7.7)
Platelets: 247 10*3/uL (ref 150–400)
RBC: 3.16 MIL/uL — AB (ref 4.22–5.81)
RDW: 17.6 % — ABNORMAL HIGH (ref 11.5–15.5)
WBC: 6.9 10*3/uL (ref 4.0–10.5)

## 2015-02-03 LAB — COMPREHENSIVE METABOLIC PANEL
ALBUMIN: 2.8 g/dL — AB (ref 3.5–5.0)
ALK PHOS: 55 U/L (ref 38–126)
ALT: 22 U/L (ref 17–63)
AST: 20 U/L (ref 15–41)
Anion gap: 8 (ref 5–15)
BILIRUBIN TOTAL: 1.1 mg/dL (ref 0.3–1.2)
BUN: 28 mg/dL — AB (ref 6–20)
CO2: 22 mmol/L (ref 22–32)
CREATININE: 1.35 mg/dL — AB (ref 0.61–1.24)
Calcium: 8.3 mg/dL — ABNORMAL LOW (ref 8.9–10.3)
Chloride: 110 mmol/L (ref 101–111)
GFR calc Af Amer: 52 mL/min — ABNORMAL LOW (ref >60)
GFR, EST NON AFRICAN AMERICAN: 45 mL/min — AB (ref >60)
GLUCOSE: 111 mg/dL — AB (ref 65–99)
POTASSIUM: 4.5 mmol/L (ref 3.5–5.1)
Sodium: 140 mmol/L (ref 135–145)
TOTAL PROTEIN: 5.8 g/dL — AB (ref 6.5–8.1)

## 2015-02-03 LAB — LACTIC ACID, PLASMA: Lactic Acid, Venous: 1.4 mmol/L (ref 0.5–2.0)

## 2015-02-03 LAB — PROTIME-INR
INR: 1.45 (ref 0.00–1.49)
Prothrombin Time: 17.7 seconds — ABNORMAL HIGH (ref 11.6–15.2)

## 2015-02-03 LAB — MRSA PCR SCREENING: MRSA by PCR: NEGATIVE

## 2015-02-03 LAB — HEMOGLOBIN
HEMOGLOBIN: 8.7 g/dL — AB (ref 13.0–17.0)
Hemoglobin: 8.4 g/dL — ABNORMAL LOW (ref 13.0–17.0)

## 2015-02-03 MED ORDER — METOPROLOL TARTRATE 12.5 MG HALF TABLET
12.5000 mg | ORAL_TABLET | Freq: Two times a day (BID) | ORAL | Status: DC
  Administered 2015-02-03 – 2015-02-06 (×7): 12.5 mg via ORAL
  Filled 2015-02-03 (×7): qty 1

## 2015-02-03 MED ORDER — ONDANSETRON HCL 4 MG/2ML IJ SOLN: 4.0000 mg | Freq: Four times a day (QID) | INTRAMUSCULAR | Status: DC | PRN

## 2015-02-03 MED ORDER — SODIUM CHLORIDE 0.9 % IJ SOLN
3.0000 mL | Freq: Two times a day (BID) | INTRAMUSCULAR | Status: DC
  Administered 2015-02-03 – 2015-02-05 (×7): 3 mL via INTRAVENOUS

## 2015-02-03 MED ORDER — PEG 3350-KCL-NA BICARB-NACL 420 G PO SOLR
4000.0000 mL | Freq: Once | ORAL | Status: AC
  Administered 2015-02-03: 4000 mL via ORAL
  Filled 2015-02-03: qty 4000

## 2015-02-03 MED ORDER — SODIUM CHLORIDE 0.9 % IV SOLN
INTRAVENOUS | Status: DC
  Administered 2015-02-03: 10 mL/h via INTRAVENOUS
  Administered 2015-02-04 – 2015-02-06 (×3): via INTRAVENOUS

## 2015-02-03 MED ORDER — CETYLPYRIDINIUM CHLORIDE 0.05 % MT LIQD
7.0000 mL | Freq: Two times a day (BID) | OROMUCOSAL | Status: DC
  Administered 2015-02-03 – 2015-02-05 (×5): 7 mL via OROMUCOSAL

## 2015-02-03 MED ORDER — ONDANSETRON HCL 4 MG PO TABS: 4.0000 mg | ORAL_TABLET | Freq: Four times a day (QID) | ORAL | Status: DC | PRN

## 2015-02-03 NOTE — Progress Notes (Signed)
CSW received consult that pt is from a facility- CSW [facility].  No CSW needs for pt to return to Independent Living.  CSW signing off for now- please reconsult if pt will need higher level of care at DC.  Merlyn LotJenna Holoman, [facility] Center[facility]WA Clinical Social Worker [phone]

## 2015-02-03 NOTE — ED Notes (Signed)
Admitting at bedside 

## 2015-02-03 NOTE — H&P (Signed)
[facility] History and Physical  Corey CaroliCarl Helwig ZOX:[id number] DOB: 1924/02/09 DOA: 02/02/2015  Referring physician: Corena HerterShannon Mumma, MD PCP: [name],[name], MD   Chief Complaint: Altered mental Status.  HPI: Corey Mcdonald is a 81 y.o. male with a past medical history of atrial fibrillation, CAD, CABG, PAD, AAA, hyperlipidemia, hypertension, macular degeneration, peptic ulcer disease, restless leg syndrome who was brought to the emergency department due to change in mental status. The patient was recently admitted on 01/27/2015 2 01/29/2015 due to upper GI bleed secondary to therapy with Eliquis and aspirin in preparation for electrical cardioversion for atrial fibrillation with Dr. Rennis GoldenHilty. He was followed today by Dr. Ewing SchleinMagod, who advised them to hold the aspirin and to come to the hospital if his hemoglobin drop below 8.5 for follow-up endoscopic studies.  Per patient's son, the patient has continued to have melena. He does states that today after dinner, the patient felt very weak, then became confused, blood pressure checked by his daughter found him to be hypotensive in the 80s/60s range and they called EMS, who brought the patient to the emergency department. Patient is currently in no acute distress.   Review of Systems:  Constitutional:  Positive fatigue.  No weight loss, night sweats, Fevers, chills. HEENT:  No headaches, Difficulty swallowing,Tooth/dental problems,Sore throat,  No sneezing, itching, ear ache, nasal congestion, post nasal drip,  Cardio-vascular:  Positive for dizziness No chest pain, Orthopnea, PND, swelling in lower extremities, anasarca,  palpitations  GI:  Positive melena. No heartburn, indigestion, abdominal pain, nausea, vomiting, diarrhea, change in bowel habits, loss of appetite  Resp:  No shortness of breath with exertion or at rest. No excess mucus, no productive cough, No non-productive cough, No coughing up of blood.No change in color of  mucus.No wheezing.No chest wall deformity  Skin:  no rash or lesions.  GU:  no dysuria, change in color of urine, no urgency or frequency. No flank pain.  Musculoskeletal:  No joint pain or swelling. No decreased range of motion. No back pain.  Psych:  Positive confusion.  Past Medical History  Diagnosis Date  . ABDOMINAL AORTIC ANEURYSM 09/15/2008  . Atrial fibrillation (HCC) 10/10/2006  . Atrial flutter (HCC) 08/18/2006  . BENIGN PROSTATIC HYPERTROPHY, HX OF 08/18/2006  . COLONIC POLYPS, HX OF 08/18/2006  . HYPERLIPIDEMIA 12/04/2009  . HYPERTENSION 08/18/2006  . HYPOTENSION 09/04/2008  . MACULAR DEGENERATION 08/18/2006  . PEPTIC ULCER DISEASE, HX OF 08/18/2006  . RESTLESS LEGS SYNDROME 12/04/2009  . SLEEP APNEA 08/18/2006  . VERTIGO 07/28/2008  . WEAKNESS 06/23/2008  . CORONARY ARTERY DISEASE     non-ST segment MI, March 15, 2010  . Anemia January 2012    admitted with acute MI March 15, 2010 and has significant acute blood loss anemia requiring transfusions of two units of packed RBCs.  Status post upper endoscopy March 22, 2010 without High-Risk bleeding lesion.  History of peptic ulcer disease  . Myocardial infarction (HCC) 02/2010  . Leg pain   . AAA (abdominal aortic aneurysm) (HCC)   . NSTEMI (non-ST elevated myocardial infarction) (HCC) 2010  . PAD (peripheral artery disease) (HCC)   . PVD (peripheral vascular disease) (HCC)   . History of Doppler ultrasound 05/2011    h/o claudication, stable ABIs  . History of nuclear stress test 2007    persantine; normal, low risk    Past Surgical History  Procedure Laterality Date  . Inguinal hernia repair      Left inguinal  . Coronary artery bypass  graft  12/1995    x5; LIMA to ALD, vein graft to RCA (now occluded),SVG to OM, SVG to diagonal  . Abdominal aortic aneurysm repair      stenting   . Cardiac catheterization  02/2010    r/t NSTEMI; loss of SVG to RCA  . Transthoracic echocardiogram  04/2011    EF 55-60%; mild LVH;  grade 1 diastolic dysfunction  . Esophagogastroduodenoscopy N/A 01/28/2015    Procedure: ESOPHAGOGASTRODUODENOSCOPY (EGD);  Surgeon: Vida Rigger, MD;  [facility] ENDOSCOPY;  Service: Endoscopy;  Laterality: N/A;   Social History:  reports that he quit smoking about 27 years ago. His smoking use included Cigarettes. He has a 40 pack-year smoking history. He has never used smokeless tobacco. He reports that he does not drink alcohol or use illicit drugs.  Allergies  Allergen Reactions  . Cilostazol Other (See Comments)    Adverse reaction per patient  . Doxazosin Other (See Comments)    INTOLERANCE     Family History  Problem Relation Age of Onset  . Heart disease Mother   . Heart disease Father   . Heart disease Sister   . Macular degeneration Sister   . Heart disease Sister   . Stroke Father   . Stroke Brother   . Hypertension Sister   . Heart Problems Sister     x2    Prior to Admission medications   Medication Sig Start Date End Date Taking? Authorizing Provider  acetaminophen (TYLENOL) 325 MG tablet Take 650 mg by mouth every 6 (six) hours as needed for moderate pain.    Yes Historical Provider, MD  apixaban (ELIQUIS) 5 MG TABS tablet Take 1 tablet (5 mg total) by mouth 2 (two) times daily. 01/02/15  Yes Quintella Reichert, MD  aspirin EC 81 MG tablet Take 81 mg by mouth daily.   Yes Historical Provider, MD  atorvastatin (LIPITOR) 20 MG tablet Take 1 tablet (20 mg total) by mouth daily. Patient taking differently: Take 20 mg by mouth daily at 6 PM.  02/28/14  Yes Gordy Savers, MD  docusate sodium (COLACE) 100 MG capsule Take 200 mg by mouth at bedtime.    Yes Historical Provider, MD  metoprolol tartrate (LOPRESSOR) 25 MG tablet TAKE ONE TABLET BY MOUTH TWICE DAILY. 01/19/15  Yes Chrystie Nose, MD  Multiple Vitamin (MULTIVITAMIN) tablet Take 1 tablet by mouth daily.    Yes Historical Provider, MD  nitroGLYCERIN (NITROSTAT) 0.4 MG SL tablet Place 1 tablet (0.4 mg total)  under the tongue every 5 (five) minutes as needed. FOR CHEST PAIN 01/20/15  Yes Chrystie Nose, MD  pantoprazole (PROTONIX) 40 MG tablet TAKE ONE TABLET BY MOUTH ONCE DAILY Patient taking differently: TAKE 40 MG BY MOUTH TWICE DAILY 12/18/14  Yes Chrystie Nose, MD  tamsulosin (FLOMAX) 0.4 MG CAPS capsule TAKE ONE CAPSULE BY MOUTH ONCE DAILY Patient taking differently: TAKE ONE CAPSULE BY MOUTH ONCE DAILY AT BEDTIME 01/19/15  Yes Gordy Savers, MD   Physical Exam: Filed Vitals:   02/02/15 2350 02/02/15 2355 02/03/15 0005 02/03/15 0010  BP: 110/60 104/74 99/69 99/66   Pulse: 92 95 107 112  Temp:      TempSrc:      Resp: Height:      Weight:      SpO2: 97% 99% 98% 98%    Wt Readings from Last 3 Encounters:  02/02/15 92.987 kg (205 lb)  01/29/15 89.495 kg (197 lb 4.8 oz)  01/27/15 92.534 kg (204 lb)    General:  Appears calm and comfortable Eyes: PERRL, normal lids, irises & conjunctiva ENT: grossly normal hearing, lips & tongue Neck: no LAD, masses or thyromegaly Cardiovascular: Irregularly irregular, no m/r/g. No LE edema. Telemetry: Atrial fibrillation at 96 bpm Respiratory: CTA bilaterally, no w/r/r. Normal respiratory effort. Abdomen: Bowel sounds positive, soft, ntnd Skin: no rash or induration seen on limited exam Musculoskeletal: grossly normal tone BUE/BLE Psychiatric: grossly normal mood and affect, speech fluent and appropriate Neurologic: grossly non-focal.          Labs on Admission:  Basic Metabolic Panel:  Recent Labs Lab 01/27/15 1723 01/28/15 0537 02/02/15 2202 02/02/15 2241  NA 137 140 138 137  K 4.1 3.8 5.0 4.6  CL 103 107 106 108  CO2 24 25  --  17*  GLUCOSE 92 110* 130* 110*  BUN 22* 21* 30* 28*  CREATININE 1.42* 1.31* 1.40* 1.49*  CALCIUM 9.4 8.8*  --  8.8*   Liver Function Tests:  Recent Labs Lab 01/27/15 1723 01/28/15 0537 02/02/15 2241  AST ALT ALKPHOS 75 64 63  BILITOT 0.5 0.4 0.7  PROT  7.1 6.2* 6.6  ALBUMIN 3.6 3.0* 3.2*   CBC:  Recent Labs Lab 01/27/15 2202 01/28/15 0045 01/28/15 0537 01/28/15 0847 01/29/15 0425 02/02/15 2202 02/02/15 2241  WBC 6.6 7.6 6.8 7.4  --   --  9.4  NEUTROABS  --   --   --   --   --   --  8.0*  HGB 8.8* 8.7* 9.1* 9.3* 8.9* 8.5* 6.9*  HCT 28.2*  27.3* 27.8* 30.3* 31.1* 29.9* 25.0* 22.5*  MCV 83.4 82.7 84.6 83.8  --   --  83.6  PLT 289 268 252 294  --   --  298   Cardiac Enzymes:  Recent Labs Lab 01/27/15 2202  TROPONINI <0.03    BNP (last 3 results)  Recent Labs  01/27/15 2202  BNP 272.0*     Assessment/Plan Principal Problem:   UGI bleed    Acute blood loss anemia Admit to a stepdown. Continue packed RBCs transfusion. Monitor hematocrit and hemoglobin. Hold Eliquis and aspirin. Start Protonix continuous infusion with loading dose. Dr. Ewing Schlein to evaluate.   Active Problems:   Essential hypertension   Hypotension  Hold antihypertensives. Monitor blood pressure closely. Resume medical treatment if the blood pressure increases    Coronary atherosclerosis Oral medications on hold. Resume beta blocker and atorvastatin once tolerating oral intake. Dr. Ewing Schlein and Dr. Rennis Golden to discuss antiplatelet and anticoagulation therapy.    Atrial fibrillation (HCC) Resume metoprolol IV or orally once the patient's blood pressure is higher. Anticoagulation to be resumed if cleared by GI.    OSA on CPAP Continue CPAP ventilation at an 8 cm of water pressure.    BPH (benign prostatic hyperplasia) Hold Flomax while the patient is hypotensive.       Dr. Ewing Schlein advised the patient's son to come to the hospital. Message left with overnight answering service, with patient's information and room location to contact me back for consult.   Code Status: Full code. DVT Prophylaxis: SCDs. Family Communication:  Disposition Plan: Admit to a stepdown, continue  Time spent: Over 70 minutes were spent in the process of this  admission.  Bobette Mo [facility] Pager [phone].

## 2015-02-03 NOTE — Progress Notes (Signed)
Utilization Review Completed.Airica Schwartzkopf T12/13/2016  

## 2015-02-03 NOTE — Telephone Encounter (Signed)
Pt's son wanted Dr.Hilty to know that the pt has been admitted back in the hospital for some bleeding issues.   Thanks

## 2015-02-03 NOTE — Progress Notes (Signed)
[facility] TEAM 1 - Stepdown/ICU TEAM Progress Note  Corey Mcdonald ZOX:[id number] DOB: Sep 15, 1923 DOA: 02/02/2015 PCP: Rogelia Boga, MD  Admit HPI / Brief Narrative: Corey Mcdonald is a 81 y.o. WM PMHx Atrial Fibrillation on Eliquis, HTN, CAD native artery, CABG, PAD, AAA, HLD, OSA on CPAP, PUD, RLS,  Brought to the emergency department due to change in mental status. The patient was recently admitted on 01/27/2015 2 01/29/2015 due to upper GI bleed secondary to therapy with Eliquis and aspirin in preparation for electrical cardioversion for atrial fibrillation with Dr. Rennis Golden. He was followed today by Dr. Ewing Schlein, who advised them to hold the aspirin and to come to the hospital if his hemoglobin drop below 8.5 for follow-up endoscopic studies.  Per patient's son, the patient has continued to have melena. He does states that today after dinner, the patient felt very weak, then became confused, blood pressure checked by his daughter found him to be hypotensive in the 80s/60s range and they called EMS, who brought the patient to the emergency department. Patient is currently in no acute distress.   HPI/Subjective: 12/13 A/O 4, NAD. Per son there was a mixup as to anticoagulation plan. Patient was to only be on full dose Eliquis however patient was on Eliquis + Aspirin (aspirin had been stopped but patient continued to receive), with resultant GI bleed. Per son on 12/7 patient had EGD negative for bleeding source.  Assessment/Plan: UGI bleed/ Acute blood loss anemia Continue packed RBCs transfusion. -Hold Eliquis and aspirin. -Continue  Protonix continuous infusion -Equal GI to perform colonoscopy in A.m. -Dr. Ewing Schlein to evaluate.   Essential hypertension/Hypotension  -See diastolic CHF  CAD native artery -Resume beta blocker and atorvastatin once tolerating oral intake. -Counseled patient and son that we would await findings of colonoscopy to decide on antiplatelet and anticoagulation  therapy. Dr. Maryjane Hurter GI and Dr. Rennis Golden cardiology will also need to weigh in on discussion .  Chronic Diastolic CHF -Last echocardiogram in 05/10/2011 -Restart metoprolol at reduced dose, 12.5 mg BID  Atrial fibrillation (HCC) -See diastolic CHF -Would hold all anticoagulation for at least 2 weeks. Patient has proven he will bleed on anticoagulation. At most would start full dose aspirin  OSA on CPAP -Continue CPAP ventilation at an 8 cm of water pressure.  BPH (benign prostatic hyperplasia) Hold Flomax while the patient is hypotensive.  Lactic acidosis -Resolved  Acute Renal failure -Most likely secondary to acute blood loss -Resolving with transfusion and fluids     Code Status: FULL Family Communication: Son present at time of exam Disposition Plan: Per GI    Consultants: Dr.Marc Maryjane Hurter GI  Procedure/Significant Events: 12/7 EGD;-Small hiatal hernia with proximal fibrous ring -Duodenal c-Loop ring not significant able to pass scope easily- EGD without signs of bleeding   Culture NA  Antibiotics: NA  DVT prophylaxis: SCD   Devices NA   LINES / TUBES:  NA    Continuous Infusions: . sodium chloride 10 mL/hr (02/03/15 1027)  . pantoprozole (PROTONIX) infusion 8 mg/hr (02/03/15 2152)    Objective: VITAL SIGNS: Temp: 97.9 F (36.6 C) (12/13 1959) Temp Source: Oral (12/13 1959) BP: 129/69 mmHg (12/13 2048) Pulse Rate: 93 (12/13 2048) SPO2; FIO2:   Intake/Output Summary (Last 24 hours) at 02/03/15 2317 Last data filed at 02/03/15 2151  Gross per 24 hour  Intake 666.83 ml  Output   1650 ml  Net -983.17 ml     Exam: General: A/O 4, NAD, No acute respiratory distress Eyes:  Negative headache, eye pain, double vision,negative scleral hemorrhage ENT: Negative Runny nose, negative ear pain, negative gingival bleeding, Neck:  Negative scars, masses, torticollis, lymphadenopathy, JVD Lungs: Clear to auscultation bilaterally without  wheezes or crackles Cardiovascular: Irregular irregular rhythm and rate, without murmur gallop or rub normal S1 and S2 Abdomen:negative abdominal pain, nondistended, positive soft, bowel sounds, no rebound, no ascites, no appreciable mass Extremities: No significant cyanosis, clubbing, or edema bilateral lower extremities Psychiatric:  Negative depression, negative anxiety, negative fatigue, negative mania  Neurologic:  Cranial nerves II through XII intact, tongue/uvula midline, all extremities muscle strength 5/5, sensation intact throughout, negative dysarthria, negative expressive aphasia, negative receptive aphasia.   Data Reviewed: Basic Metabolic Panel:  Recent Labs Lab 01/28/15 0537 02/02/15 2202 02/02/15 2241 02/03/15 0905  NA 140 138 137 140  K 3.8 5.0 4.6 4.5  CL 107 106 108 110  CO2 25  --  17* 22  GLUCOSE 110* 130* 110* 111*  BUN 21* 30* 28* 28*  CREATININE 1.31* 1.40* 1.49* 1.35*  CALCIUM 8.8*  --  8.8* 8.3*   Liver Function Tests:  Recent Labs Lab 01/28/15 0537 02/02/15 2241 02/03/15 0905  AST ALT ALKPHOS 64 63 55  BILITOT 0.4 0.7 1.1  PROT 6.2* 6.6 5.8*  ALBUMIN 3.0* 3.2* 2.8*   No results for input(s): LIPASE, AMYLASE in the last 168 hours. No results for input(s): AMMONIA in the last 168 hours. CBC:  Recent Labs Lab 01/28/15 0045 01/28/15 0537 01/28/15 0847 01/29/15 0425 02/02/15 2202 02/02/15 2241 02/03/15 0905 02/03/15 1008 02/03/15 1600  WBC 7.6 6.8 7.4  --   --  9.4 6.9  --   --   NEUTROABS  --   --   --   --   --  8.0* 4.9  --   --   HGB 8.7* 9.1* 9.3* 8.9* 8.5* 6.9* 8.2* 8.7* 8.4*  HCT 27.8* 30.3* 31.1* 29.9* 25.0* 22.5* 26.6*  --   --   MCV 82.7 84.6 83.8  --   --  83.6 84.2  --   --   PLT 268 252 294  --   --  298 247  --   --    Cardiac Enzymes: No results for input(s): CKTOTAL, CKMB, CKMBINDEX, TROPONINI in the last 168 hours. BNP (last 3 results)  Recent Labs  01/27/15 2202  BNP 272.0*    ProBNP  (last 3 results) No results for input(s): PROBNP in the last 8760 hours.  CBG: No results for input(s): GLUCAP in the last 168 hours.  Recent Results (from the past 240 hour(s))  MRSA PCR Screening     Status: None   Collection Time: 02/03/15  1:39 AM  Result Value Ref Range Status   MRSA by PCR NEGATIVE NEGATIVE Final    Comment:        The GeneXpert MRSA Assay (FDA approved for NASAL specimens only), is one component of a comprehensive MRSA colonization surveillance program. It is not intended to diagnose MRSA infection nor to guide or monitor treatment for MRSA infections.      Studies:  Recent x-ray studies have been reviewed in detail by the Attending Physician  Scheduled Meds:  Scheduled Meds: . antiseptic oral rinse  7 mL Mouth Rinse BID  . metoprolol tartrate  12.5 mg Oral BID  . [START ON 02/06/2015] pantoprazole (PROTONIX) IV  40 mg Intravenous Q12H  . sodium chloride  3 mL Intravenous Q12H  Time spent on care of this patient: 40 mins   Romani Wilbon, Roselind MessierURTIS J , MD  [facility] Office  [phone] Pager [phone]- [phone]  On-Call/Text Page:      [email or web address]      password [email or web address]  If 7PM-7AM, please contact night-coverage [email or web address] Password [email or web address] 02/03/2015, 11:17 PM   LOS: 1 day   Care during the described time interval was provided by me .  I have reviewed this patient's available data, including medical history, events of note, physical examination, and all test results as part of my evaluation. I have personally reviewed and interpreted all radiology studies.   Carolyne Littlesurtis Gennifer Potenza, MD [phone] Pager

## 2015-02-03 NOTE — Consult Note (Signed)
Referring Provider: Dr. Woods Primary Care Physician:  KWIATKOWSKI,[name], MD Primary Gastroenterologist:  Dr. Magod  Reason for Consultation:  GI bleed  HPI: Corey Mcdonald is a 81 y.o. male with recent hospitalization for GI bleed on Eliquis and EGD done last week that showed a small hiatal hernia without any active bleeding or source seen. Patient reports 2-3 days of black stools since discharge and denies associated abdominal pain, nausea, vomiting, or hematochezia. +Weakness. Family checked BP and he was hypotensive in the 80's/60's. Colonoscopy last in 2011 that was negative for polyps. No family history of colon cancer. Hgb 6.9 (8.9 on 01/29/15). Hemodynamically stable. S/P 2 U PRBCs and post-transfusion Hgb 8.2. On Eliquis at discharge last week.     Past Medical History  Diagnosis Date  . ABDOMINAL AORTIC ANEURYSM 09/15/2008  . Atrial fibrillation (HCC) 10/10/2006  . Atrial flutter (HCC) 08/18/2006  . BENIGN PROSTATIC HYPERTROPHY, HX OF 08/18/2006  . COLONIC POLYPS, HX OF 08/18/2006  . HYPERLIPIDEMIA 12/04/2009  . HYPERTENSION 08/18/2006  . HYPOTENSION 09/04/2008  . MACULAR DEGENERATION 08/18/2006  . PEPTIC ULCER DISEASE, HX OF 08/18/2006  . RESTLESS LEGS SYNDROME 12/04/2009  . SLEEP APNEA 08/18/2006  . VERTIGO 07/28/2008  . WEAKNESS 06/23/2008  . CORONARY ARTERY DISEASE     non-ST segment MI, March 15, 2010  . Anemia January 2012    admitted with acute MI March 15, 2010 and has significant acute blood loss anemia requiring transfusions of two units of packed RBCs.  Status post upper endoscopy March 22, 2010 without High-Risk bleeding lesion.  History of peptic ulcer disease  . Myocardial infarction (HCC) 02/2010  . Leg pain   . AAA (abdominal aortic aneurysm) (HCC)   . NSTEMI (non-ST elevated myocardial infarction) (HCC) 2010  . PAD (peripheral artery disease) (HCC)   . PVD (peripheral vascular disease) (HCC)   . History of Doppler ultrasound 05/2011    h/o claudication,  stable ABIs  . History of nuclear stress test 2007    persantine; normal, low risk     Past Surgical History  Procedure Laterality Date  . Inguinal hernia repair      Left inguinal  . Coronary artery bypass graft  12/1995    x5; LIMA to ALD, vein graft to RCA (now occluded),SVG to OM, SVG to diagonal  . Abdominal aortic aneurysm repair      stenting   . Cardiac catheterization  02/2010    r/t NSTEMI; loss of SVG to RCA  . Transthoracic echocardiogram  04/2011    EF 55-60%; mild LVH; grade 1 diastolic dysfunction  . Esophagogastroduodenoscopy N/A 01/28/2015    Procedure: ESOPHAGOGASTRODUODENOSCOPY (EGD);  Surgeon: Marc Magod, MD;  Location: [facility] ENDOSCOPY;  Service: Endoscopy;  Laterality: N/A;    Prior to Admission medications   Medication Sig Start Date End Date Taking? Authorizing Provider  acetaminophen (TYLENOL) 325 MG tablet Take 650 mg by mouth every 6 (six) hours as needed for moderate pain.    Yes Historical Provider, MD  apixaban (ELIQUIS) 5 MG TABS tablet Take 1 tablet (5 mg total) by mouth 2 (two) times daily. 01/02/15  Yes Traci R Turner, MD  aspirin EC 81 MG tablet Take 81 mg by mouth daily.   Yes Historical Provider, MD  atorvastatin (LIPITOR) 20 MG tablet Take 1 tablet (20 mg total) by mouth daily. Patient taking differently: Take 20 mg by mouth daily at 6 PM.  02/28/14  Yes Peter F Kwiatkowski, MD  docusate sodium (COLACE) 100 MG capsule Take   200 mg by mouth at bedtime.    Yes Historical Provider, MD  metoprolol tartrate (LOPRESSOR) 25 MG tablet TAKE ONE TABLET BY MOUTH TWICE DAILY. 01/19/15  Yes Chrystie Nose, MD  Multiple Vitamin (MULTIVITAMIN) tablet Take 1 tablet by mouth daily.    Yes Historical Provider, MD  nitroGLYCERIN (NITROSTAT) 0.4 MG SL tablet Place 1 tablet (0.4 mg total) under the tongue every 5 (five) minutes as needed. FOR CHEST PAIN 01/20/15  Yes Chrystie Nose, MD  pantoprazole (PROTONIX) 40 MG tablet TAKE ONE TABLET BY MOUTH ONCE DAILY Patient taking  differently: TAKE 40 MG BY MOUTH TWICE DAILY 12/18/14  Yes Chrystie Nose, MD  tamsulosin (FLOMAX) 0.4 MG CAPS capsule TAKE ONE CAPSULE BY MOUTH ONCE DAILY Patient taking differently: TAKE ONE CAPSULE BY MOUTH ONCE DAILY AT BEDTIME 01/19/15  Yes Gordy Savers, MD    Scheduled Meds: . antiseptic oral rinse  7 mL Mouth Rinse BID  . [START ON 02/06/2015] pantoprazole (PROTONIX) IV  40 mg Intravenous Q12H  . polyethylene glycol-electrolytes  4,000 mL Oral Once  . sodium chloride  3 mL Intravenous Q12H   Continuous Infusions: . sodium chloride    . pantoprozole (PROTONIX) infusion 8 mg/hr (02/03/15 0134)   PRN Meds:.ondansetron **OR** ondansetron (ZOFRAN) IV  Allergies as of 02/02/2015 - Review Complete 02/02/2015  Allergen Reaction Noted  . Cilostazol Other (See Comments) 12/24/2010  . Doxazosin Other (See Comments) 12/24/2010    Family History  Problem Relation Age of Onset  . Heart disease Mother   . Heart disease Father   . Heart disease Sister   . Macular degeneration Sister   . Heart disease Sister   . Stroke Father   . Stroke Brother   . Hypertension Sister   . Heart Problems Sister     x2    Social History   Social History  . Marital Status: Married    Spouse Name: N/A  . Number of Children: 2  . Years of Education: N/A   Occupational History  . Not on file.   Social History Main Topics  . Smoking status: Former Smoker -- 1.00 packs/day for 40 years    Types: Cigarettes    Quit date: 02/22/1987  . Smokeless tobacco: Never Used  . Alcohol Use: No  . Drug Use: No  . Sexual Activity: Not on file   Other Topics Concern  . Not on file   Social History Narrative    Review of Systems: All negative except as stated above in HPI.  Physical Exam: Vital signs: Filed Vitals:   02/03/15 0700 02/03/15 0810  BP: 143/78 130/75  Pulse: 105 99  Temp:  97.6 F (36.4 C)  Resp: 28 25   Last BM Date: 02/02/15 (bloody) General:   Lethargic, elderly,  Well-developed, well-nourished, pleasant and cooperative in NAD Head: atraumatic Eyes: anicteric ENT: oropharynx clear Neck: supple, nontender Lungs:  Clear throughout to auscultation.   No wheezes, crackles, or rhonchi. No acute distress. Heart:  Regular rate and rhythm; no murmurs, clicks, rubs,  or gallops. Abdomen: soft, nontender, nondistended, +BS  Rectal:  Deferred Ext: pedal pulses intact, minimal LE edema  GI:  Lab Results:  Recent Labs  02/02/15 2202 02/02/15 2241  WBC  --  9.4  HGB 8.5* 6.9*  HCT 25.0* 22.5*  PLT  --  298   BMET  Recent Labs  02/02/15 2202 02/02/15 2241  NA 138 137  K 5.0 4.6  CL 106 108  CO2  --  17*  GLUCOSE 130* 110*  BUN 30* 28*  CREATININE 1.40* 1.49*  CALCIUM  --  8.8*   LFT  Recent Labs  02/02/15 2241  PROT 6.6  ALBUMIN 3.2*  AST 28  ALT 27  ALKPHOS 63  BILITOT 0.7   PT/INR  Recent Labs  02/02/15 2241  LABPROT 18.9*  INR 1.58*     Studies/Results: No results found.  Impression/Plan: GI bleed with melena and recent negative EGD in the setting of Eliquis. Eliquis on hold. Colonoscopy prep today and procedure tomorrow. Continue to hold anticoagulation until after the colonoscopy. Clear liquid diet. NPO p MN. Supportive care.    LOS: 1 day   Arrabella Westerman C.  02/03/2015, 9:29 AM  Pager [phone]  If no answer or after 5 PM call [phone]

## 2015-02-03 NOTE — Telephone Encounter (Signed)
Message sent to Dr.Hilty. 

## 2015-02-04 ENCOUNTER — Inpatient Hospital Stay (HOSPITAL_COMMUNITY): Payer: Medicare Other | Admitting: Critical Care Medicine

## 2015-02-04 ENCOUNTER — Encounter (HOSPITAL_COMMUNITY)
Admission: EM | Disposition: A | Discharge status: Skilled Nursing Facility | Payer: Medicare Other | Source: Home / Self Care | Attending: Internal Medicine

## 2015-02-04 ENCOUNTER — Encounter (HOSPITAL_COMMUNITY): Payer: Self-pay | Admitting: *Deleted

## 2015-02-04 DIAGNOSIS — K922 Gastrointestinal hemorrhage, unspecified: Secondary | ICD-10-CM

## 2015-02-04 HISTORY — PX: COLONOSCOPY WITH PROPOFOL: SHX5780

## 2015-02-04 LAB — TYPE AND SCREEN
ABO/RH(D): A POS
ANTIBODY SCREEN: NEGATIVE
UNIT DIVISION: 0
UNIT DIVISION: 0

## 2015-02-04 LAB — CBC
HCT: 27.2 % — ABNORMAL LOW (ref 39.0–52.0)
HEMOGLOBIN: 8.6 g/dL — AB (ref 13.0–17.0)
MCH: 26.7 pg (ref 26.0–34.0)
MCHC: 31.6 g/dL (ref 30.0–36.0)
MCV: 84.5 fL (ref 78.0–100.0)
Platelets: 277 10*3/uL (ref 150–400)
RBC: 3.22 MIL/uL — AB (ref 4.22–5.81)
RDW: 17.9 % — ABNORMAL HIGH (ref 11.5–15.5)
WBC: 7.7 10*3/uL (ref 4.0–10.5)

## 2015-02-04 LAB — CBC WITH DIFFERENTIAL/PLATELET
BASOS PCT: 0 %
Basophils Absolute: 0 10*3/uL (ref 0.0–0.1)
Eosinophils Absolute: 0.3 10*3/uL (ref 0.0–0.7)
Eosinophils Relative: 3 %
HEMATOCRIT: 30.3 % — AB (ref 39.0–52.0)
HEMOGLOBIN: 9.1 g/dL — AB (ref 13.0–17.0)
LYMPHS ABS: 1.2 10*3/uL (ref 0.7–4.0)
Lymphocytes Relative: 14 %
MCH: 25.4 pg — ABNORMAL LOW (ref 26.0–34.0)
MCHC: 30 g/dL (ref 30.0–36.0)
MCV: 84.6 fL (ref 78.0–100.0)
MONOS PCT: 11 %
Monocytes Absolute: 0.9 10*3/uL (ref 0.1–1.0)
NEUTROS ABS: 6.4 10*3/uL (ref 1.7–7.7)
NEUTROS PCT: 72 %
Platelets: 257 10*3/uL (ref 150–400)
RBC: 3.58 MIL/uL — AB (ref 4.22–5.81)
RDW: 17.8 % — ABNORMAL HIGH (ref 11.5–15.5)
WBC: 8.8 10*3/uL (ref 4.0–10.5)

## 2015-02-04 SURGERY — COLONOSCOPY WITH PROPOFOL
Anesthesia: Monitor Anesthesia Care

## 2015-02-04 MED ORDER — TAMSULOSIN HCL 0.4 MG PO CAPS: 0.4000 mg | ORAL_CAPSULE | Freq: Every day | ORAL | Status: DC

## 2015-02-04 MED ORDER — PANTOPRAZOLE SODIUM 40 MG PO TBEC
40.0000 mg | DELAYED_RELEASE_TABLET | Freq: Every day | ORAL | Status: DC
  Administered 2015-02-05 – 2015-02-06 (×2): 40 mg via ORAL
  Filled 2015-02-04 (×2): qty 1

## 2015-02-04 MED ORDER — PROPOFOL 10 MG/ML IV BOLUS
INTRAVENOUS | Status: DC | PRN
  Administered 2015-02-04 (×3): 10 mg via INTRAVENOUS

## 2015-02-04 MED ORDER — PROPOFOL 500 MG/50ML IV EMUL
INTRAVENOUS | Status: DC | PRN
  Administered 2015-02-04: 100 ug/kg/min via INTRAVENOUS

## 2015-02-04 MED ORDER — ATORVASTATIN CALCIUM 20 MG PO TABS
20.0000 mg | ORAL_TABLET | Freq: Every day | ORAL | Status: DC
  Administered 2015-02-04 – 2015-02-05 (×2): 20 mg via ORAL
  Filled 2015-02-04 (×2): qty 1

## 2015-02-04 MED ORDER — METOPROLOL TARTRATE 25 MG PO TABS: 25.0000 mg | ORAL_TABLET | Freq: Two times a day (BID) | ORAL | Status: DC

## 2015-02-04 MED ORDER — PHENYLEPHRINE HCL 10 MG/ML IJ SOLN
INTRAMUSCULAR | Status: DC | PRN
  Administered 2015-02-04: 80 ug via INTRAVENOUS

## 2015-02-04 MED ORDER — TAMSULOSIN HCL 0.4 MG PO CAPS
0.4000 mg | ORAL_CAPSULE | Freq: Every day | ORAL | Status: DC
  Administered 2015-02-05 – 2015-02-06 (×2): 0.4 mg via ORAL
  Filled 2015-02-04 (×2): qty 1

## 2015-02-04 NOTE — Anesthesia Preprocedure Evaluation (Addendum)
Anesthesia Evaluation  Patient identified by MRN, date of birth, ID band Patient awake    Reviewed: Allergy & Precautions, NPO status , Patient's Chart, lab work & pertinent test results, reviewed documented beta blocker date and time   Airway Mallampati: III   Neck ROM: full    Dental  (+) Dental Advisory Given   Pulmonary shortness of breath, sleep apnea , former smoker,    breath sounds clear to auscultation       Cardiovascular hypertension, Pt. on medications and Pt. on home beta blockers + CAD, + Past MI, + CABG, + Peripheral Vascular Disease, +CHF and + DOE  + dysrhythmias Atrial Fibrillation  Rhythm:regular Rate:Normal     Neuro/Psych    GI/Hepatic   Endo/Other    Renal/GU Renal InsufficiencyRenal disease     Musculoskeletal   Abdominal   Peds  Hematology  (+) anemia ,   Anesthesia Other Findings   Reproductive/Obstetrics                            Anesthesia Physical Anesthesia Plan  ASA: III  Anesthesia Plan: MAC   Post-op Pain Management:    Induction: Intravenous  Airway Management Planned: Nasal Cannula  Additional Equipment:   Intra-op Plan:   Post-operative Plan:   Informed Consent: I have reviewed the patients History and Physical, chart, labs and discussed the procedure including the risks, benefits and alternatives for the proposed anesthesia with the patient or authorized representative who has indicated his/her understanding and acceptance.   Dental advisory given  Plan Discussed with: Anesthesiologist and Surgeon  Anesthesia Plan Comments:         Anesthesia Quick Evaluation

## 2015-02-04 NOTE — Interval H&P Note (Signed)
History and Physical Interval Note:  02/04/2015 1:04 PM  Corey Mcdonald  has presented today for surgery, with the diagnosis of gi bleed  The various methods of treatment have been discussed with the patient and family. After consideration of risks, benefits and other options for treatment, the patient has consented to  Procedure(s): COLONOSCOPY WITH PROPOFOL (N/A) as a surgical intervention .  The patient's history has been reviewed, patient examined, no change in status, stable for surgery.  I have reviewed the patient's chart and labs.  Questions were answered to the patient's satisfaction.     Caelan Branden C.

## 2015-02-04 NOTE — H&P (View-Only) (Signed)
Referring Provider: Dr. Joseph Art Primary Care Physician:  Rogelia Boga, MD Primary Gastroenterologist:  Dr. Ewing Schlein  Reason for Consultation:  GI bleed  HPI: Corey Mcdonald is a 81 y.o. male with recent hospitalization for GI bleed on Eliquis and EGD done last week that showed a small hiatal hernia without any active bleeding or source seen. Patient reports 2-3 days of black stools since discharge and denies associated abdominal pain, nausea, vomiting, or hematochezia. +Weakness. Family checked BP and he was hypotensive in the 80's/60's. Colonoscopy last in 2011 that was negative for polyps. No family history of colon cancer. Hgb 6.9 (8.9 on 01/29/15). Hemodynamically stable. S/P 2 U PRBCs and post-transfusion Hgb 8.2. On Eliquis at discharge last week.     Past Medical History  Diagnosis Date  . ABDOMINAL AORTIC ANEURYSM 09/15/2008  . Atrial fibrillation (HCC) 10/10/2006  . Atrial flutter (HCC) 08/18/2006  . BENIGN PROSTATIC HYPERTROPHY, HX OF 08/18/2006  . COLONIC POLYPS, HX OF 08/18/2006  . HYPERLIPIDEMIA 12/04/2009  . HYPERTENSION 08/18/2006  . HYPOTENSION 09/04/2008  . MACULAR DEGENERATION 08/18/2006  . PEPTIC ULCER DISEASE, HX OF 08/18/2006  . RESTLESS LEGS SYNDROME 12/04/2009  . SLEEP APNEA 08/18/2006  . VERTIGO 07/28/2008  . WEAKNESS 06/23/2008  . CORONARY ARTERY DISEASE     non-ST segment MI, March 15, 2010  . Anemia January 2012    admitted with acute MI March 15, 2010 and has significant acute blood loss anemia requiring transfusions of two units of packed RBCs.  Status post upper endoscopy March 22, 2010 without High-Risk bleeding lesion.  History of peptic ulcer disease  . Myocardial infarction (HCC) 02/2010  . Leg pain   . AAA (abdominal aortic aneurysm) (HCC)   . NSTEMI (non-ST elevated myocardial infarction) (HCC) 2010  . PAD (peripheral artery disease) (HCC)   . PVD (peripheral vascular disease) (HCC)   . History of Doppler ultrasound 05/2011    h/o claudication,  stable ABIs  . History of nuclear stress test 2007    persantine; normal, low risk     Past Surgical History  Procedure Laterality Date  . Inguinal hernia repair      Left inguinal  . Coronary artery bypass graft  12/1995    x5; LIMA to ALD, vein graft to RCA (now occluded),SVG to OM, SVG to diagonal  . Abdominal aortic aneurysm repair      stenting   . Cardiac catheterization  02/2010    r/t NSTEMI; loss of SVG to RCA  . Transthoracic echocardiogram  04/2011    EF 55-60%; mild LVH; grade 1 diastolic dysfunction  . Esophagogastroduodenoscopy N/A 01/28/2015    Procedure: ESOPHAGOGASTRODUODENOSCOPY (EGD);  Surgeon: Vida Rigger, MD;  Location: [facility] ENDOSCOPY;  Service: Endoscopy;  Laterality: N/A;    Prior to Admission medications   Medication Sig Start Date End Date Taking? Authorizing Provider  acetaminophen (TYLENOL) 325 MG tablet Take 650 mg by mouth every 6 (six) hours as needed for moderate pain.    Yes Historical Provider, MD  apixaban (ELIQUIS) 5 MG TABS tablet Take 1 tablet (5 mg total) by mouth 2 (two) times daily. 01/02/15  Yes Quintella Reichert, MD  aspirin EC 81 MG tablet Take 81 mg by mouth daily.   Yes Historical Provider, MD  atorvastatin (LIPITOR) 20 MG tablet Take 1 tablet (20 mg total) by mouth daily. Patient taking differently: Take 20 mg by mouth daily at 6 PM.  02/28/14  Yes Gordy Savers, MD  docusate sodium (COLACE) 100 MG capsule Take  200 mg by mouth at bedtime.    Yes Historical Provider, MD  metoprolol tartrate (LOPRESSOR) 25 MG tablet TAKE ONE TABLET BY MOUTH TWICE DAILY. 01/19/15  Yes Chrystie Nose, MD  Multiple Vitamin (MULTIVITAMIN) tablet Take 1 tablet by mouth daily.    Yes Historical Provider, MD  nitroGLYCERIN (NITROSTAT) 0.4 MG SL tablet Place 1 tablet (0.4 mg total) under the tongue every 5 (five) minutes as needed. FOR CHEST PAIN 01/20/15  Yes Chrystie Nose, MD  pantoprazole (PROTONIX) 40 MG tablet TAKE ONE TABLET BY MOUTH ONCE DAILY Patient taking  differently: TAKE 40 MG BY MOUTH TWICE DAILY 12/18/14  Yes Chrystie Nose, MD  tamsulosin (FLOMAX) 0.4 MG CAPS capsule TAKE ONE CAPSULE BY MOUTH ONCE DAILY Patient taking differently: TAKE ONE CAPSULE BY MOUTH ONCE DAILY AT BEDTIME 01/19/15  Yes Gordy Savers, MD    Scheduled Meds: . antiseptic oral rinse  7 mL Mouth Rinse BID  . [START ON 02/06/2015] pantoprazole (PROTONIX) IV  40 mg Intravenous Q12H  . polyethylene glycol-electrolytes  4,000 mL Oral Once  . sodium chloride  3 mL Intravenous Q12H   Continuous Infusions: . sodium chloride    . pantoprozole (PROTONIX) infusion 8 mg/hr (02/03/15 0134)   PRN Meds:.ondansetron **OR** ondansetron (ZOFRAN) IV  Allergies as of 02/02/2015 - Review Complete 02/02/2015  Allergen Reaction Noted  . Cilostazol Other (See Comments) 12/24/2010  . Doxazosin Other (See Comments) 12/24/2010    Family History  Problem Relation Age of Onset  . Heart disease Mother   . Heart disease Father   . Heart disease Sister   . Macular degeneration Sister   . Heart disease Sister   . Stroke Father   . Stroke Brother   . Hypertension Sister   . Heart Problems Sister     x2    Social History   Social History  . Marital Status: Married    Spouse Name: N/A  . Number of Children: 2  . Years of Education: N/A   Occupational History  . Not on file.   Social History Main Topics  . Smoking status: Former Smoker -- 1.00 packs/day for 40 years    Types: Cigarettes    Quit date: 02/22/1987  . Smokeless tobacco: Never Used  . Alcohol Use: No  . Drug Use: No  . Sexual Activity: Not on file   Other Topics Concern  . Not on file   Social History Narrative    Review of Systems: All negative except as stated above in HPI.  Physical Exam: Vital signs: Filed Vitals:   02/03/15 0700 02/03/15 0810  BP: 143/78 130/75  Pulse: 105 99  Temp:  97.6 F (36.4 C)  Resp: 28 25   Last BM Date: 02/02/15 (bloody) General:   Lethargic, elderly,  Well-developed, well-nourished, pleasant and cooperative in NAD Head: atraumatic Eyes: anicteric ENT: oropharynx clear Neck: supple, nontender Lungs:  Clear throughout to auscultation.   No wheezes, crackles, or rhonchi. No acute distress. Heart:  Regular rate and rhythm; no murmurs, clicks, rubs,  or gallops. Abdomen: soft, nontender, nondistended, +BS  Rectal:  Deferred Ext: pedal pulses intact, minimal LE edema  GI:  Lab Results:  Recent Labs  02/02/15 2202 02/02/15 2241  WBC  --  9.4  HGB 8.5* 6.9*  HCT 25.0* 22.5*  PLT  --  298   BMET  Recent Labs  02/02/15 2202 02/02/15 2241  NA 138 137  K 5.0 4.6  CL 106 108  CO2  --  17*  GLUCOSE 130* 110*  BUN 30* 28*  CREATININE 1.40* 1.49*  CALCIUM  --  8.8*   LFT  Recent Labs  02/02/15 2241  PROT 6.6  ALBUMIN 3.2*  AST 28  ALT 27  ALKPHOS 63  BILITOT 0.7   PT/INR  Recent Labs  02/02/15 2241  LABPROT 18.9*  INR 1.58*     Studies/Results: No results found.  Impression/Plan: GI bleed with melena and recent negative EGD in the setting of Eliquis. Eliquis on hold. Colonoscopy prep today and procedure tomorrow. Continue to hold anticoagulation until after the colonoscopy. Clear liquid diet. NPO p MN. Supportive care.    LOS: 1 day   Evalie Hargraves C.  02/03/2015, 9:29 AM  Pager [phone]  If no answer or after 5 PM call [phone]

## 2015-02-04 NOTE — Brief Op Note (Signed)
Sigmoid diverticulosis. Small AVM. No active bleeding. If rebleeds, then may need reevaluation of aneursym graft but defer to surgery/cards. Ok to resume anticoagulation from GI standpoint. Would rec single agent over dual agent from bleeding risk standpoint but defer to cardiology. Full liquid diet and advance as tolerated.

## 2015-02-04 NOTE — Progress Notes (Signed)
[place] TEAM 1 - Stepdown/ICU TEAM PROGRESS NOTE  Corey Mcdonald WUJ:[id number] DOB: [birth year] DOA: 02/02/2015 PCP: [name],[name], MD  Admit HPI / Brief Narrative: 81 y.o. male with a history of atrial fibrillation, CAD, CABG, PAD, AAA, hyperlipidemia, hypertension, macular degeneration, peptic ulcer disease, and restless leg syndrome who was brought to the ED due to AMS. The patient was admitted 01/27/2015 through 01/29/2015 due to upper GI bleed secondary to therapy with Eliquis and aspirin in preparation for electrical cardioversion for atrial fibrillation. He was followed by Dr. Ewing SchleinMagod, who advised him to hold the aspirin and to come to the hospital if his hemoglobin dropped below 8.5.  The patient continued to have melena. He felt very weak, then became confused.  His blood pressure was checked by his daughter and found to be 80s/60s range and they called EMS.  HPI/Subjective: The patient is resting comfortably post endoscopy in his hospital room on CPAP.  He is comfortable.  I spoke with his son at great length in the hallway and addressed multiple questions.  Assessment/Plan:  Recurrent GI bleed / Acute blood loss anemia Hold Eliquis and aspirin for now - I have asked Cards to see him in consultation 12/15 to comment on further use of anticoag - GI has cleared him for resumption of a single agent, but will ask Cards to comment on wether this should be Eliquis or ASA  HTN  BP currently well controlled   Coronary atherosclerosis Resume beta blocker and atorvastatin this evening - hold ASA until seen by Cards - no active anginal sx  Atrial fibrillation  Rate controlled, but remains in afib - discussed risk of CVA w/ son at length, but presently risk of bleeding felt to be too high - will consider resuming ASA or Eliquis in AM after seen by Cardiology   OSA on CPAP Continue CPAP per home regimen QHS and when sleeping during day   BPH Resume flomax   CKD Stage  3 Baseline crt of late ~1.3-1.5 - crt in baseline range at present   Code Status: FULL Family Communication: spoke w/ son at length in hallway  Disposition Plan: SDU   Consultants: [facility]  [facility] Cards for 12/15  Procedures: [place] - 12/14 - sigmoid diverticulosis - small avm - no active bleeding  Antibiotics: None   DVT prophylaxis: SCDs  Objective: Blood pressure 107/52, pulse 59, temperature 98.1 F (36.7 C), temperature source Oral, resp. rate 15, height 5\' 11"  (1.803 m), weight 90.719 kg (200 lb), SpO2 95 %.  Intake/Output Summary (Last 24 hours) at 02/04/15 1431 Last data filed at 02/04/15 1334  Gross per 24 hour  Intake 910.25 ml  Output   1725 ml  Net -814.75 ml   Exam: General: No acute respiratory distress Lungs: Clear to auscultation bilaterally without wheezes or crackles Cardiovascular: Regular rate - afib - without murmur gallop or rub  Abdomen: Nontender, nondistended, soft, bowel sounds positive, no rebound, no ascites, no appreciable mass Extremities: No significant cyanosis, clubbing, or edema bilateral lower extremities  Data Reviewed: Basic Metabolic Panel:  Recent Labs Lab 02/02/15 2202 02/02/15 2241 02/03/15 0905  NA 138 137 140  K 5.0 4.6 4.5  CL 106 108 110  CO2  --  17* 22  GLUCOSE 130* 110* 111*  BUN 30* 28* 28*  CREATININE 1.40* 1.49* 1.35*  CALCIUM  --  8.8* 8.3*    CBC:  Recent Labs Lab 01/29/15 0425 02/02/15 2202 02/02/15 2241 02/03/15 0905 02/03/15 1008 02/03/15 1600  02/04/15 0230  WBC  --   --  9.4 6.9  --   --  8.8  NEUTROABS  --   --  8.0* 4.9  --   --  6.4  HGB 8.9* 8.5* 6.9* 8.2* 8.7* 8.4* 9.1*  HCT 29.9* 25.0* 22.5* 26.6*  --   --  30.3*  MCV  --   --  83.6 84.2  --   --  84.6  PLT  --   --  298 247  --   --  257    Liver Function Tests:  Recent Labs Lab 02/02/15 2241 02/03/15 0905  AST 28 20  ALT 27 22  ALKPHOS 63 55  BILITOT 0.7 1.1  PROT 6.6 5.8*  ALBUMIN 3.2* 2.8*   Coags:  Recent  Labs Lab 02/02/15 2241 02/03/15 0905  INR 1.58* 1.45    Recent Labs Lab 02/02/15 2241  APTT 32    Recent Results (from the past 240 hour(s))  MRSA PCR Screening     Status: None   Collection Time: 02/03/15  1:39 AM  Result Value Ref Range Status   MRSA by PCR NEGATIVE NEGATIVE Final    Comment:        The GeneXpert MRSA Assay (FDA approved for NASAL specimens only), is one component of a comprehensive MRSA colonization surveillance program. It is not intended to diagnose MRSA infection nor to guide or monitor treatment for MRSA infections.      Studies:   Recent x-ray studies have been reviewed in detail by the Attending Physician  Scheduled Meds:  Scheduled Meds: . antiseptic oral rinse  7 mL Mouth Rinse BID  . metoprolol tartrate  12.5 mg Oral BID  . [START ON 02/06/2015] pantoprazole (PROTONIX) IV  40 mg Intravenous Q12H  . sodium chloride  3 mL Intravenous Q12H    Time spent on care of this patient: 35 mins   MCCLUNG,JEFFREY T , MD   [facility] Office  [phone] Pager - Text Page per Loretha Stapler as per below:  On-Call/Text Page:      [email or web address]      password [email or web address]  If 7PM-7AM, please contact night-coverage [email or web address] Password [email or web address] 02/04/2015, 2:31 PM   LOS: 2 days

## 2015-02-04 NOTE — Anesthesia Procedure Notes (Signed)
Procedure Name: MAC Date/Time: 02/04/2015 1:10 PM Performed by: Glo HerringLEE, Asher Babilonia B Pre-anesthesia Checklist: Patient identified, Emergency Drugs available, Suction available, Patient being monitored and Timeout performed Patient Re-evaluated:Patient Re-evaluated prior to inductionOxygen Delivery Method: Simple face mask Intubation Type: IV induction Placement Confirmation: positive ETCO2 and breath sounds checked- equal and bilateral Dental Injury: Teeth and Oropharynx as per pre-operative assessment

## 2015-02-04 NOTE — Transfer of Care (Signed)
Immediate Anesthesia Transfer of Care Note  Patient: Corey CaroliCarl Bellin  Procedure(s) Performed: Procedure(s): COLONOSCOPY WITH PROPOFOL (N/A)  Patient Location: Endoscopy Unit  Anesthesia Type:MAC  Level of Consciousness: awake, alert  and oriented  Airway & Oxygen Therapy: Patient Spontanous Breathing and Patient connected to face mask oxygen  Post-op Assessment: Report given to RN and Post -op Vital signs reviewed and stable  Post vital signs: Reviewed and stable  Last Vitals:  Filed Vitals:   02/04/15 1101 02/04/15 1141  BP: 117/67 114/52  Pulse: 98 101  Temp:  37.1 C  Resp:  16    Complications: No apparent anesthesia complications

## 2015-02-04 NOTE — Progress Notes (Signed)
RT assisted patient with putting on his CPAP tonight. Pt uses a chin strap. Pt understands to call if he has concerns.

## 2015-02-05 ENCOUNTER — Encounter (HOSPITAL_COMMUNITY): Payer: Self-pay | Admitting: Gastroenterology

## 2015-02-05 DIAGNOSIS — I481 Persistent atrial fibrillation: Secondary | ICD-10-CM

## 2015-02-05 DIAGNOSIS — I2510 Atherosclerotic heart disease of native coronary artery without angina pectoris: Secondary | ICD-10-CM | POA: Diagnosis present

## 2015-02-05 LAB — COMPREHENSIVE METABOLIC PANEL
ALT: 17 U/L (ref 17–63)
AST: 18 U/L (ref 15–41)
Albumin: 2.6 g/dL — ABNORMAL LOW (ref 3.5–5.0)
Alkaline Phosphatase: 55 U/L (ref 38–126)
Anion gap: 6 (ref 5–15)
BILIRUBIN TOTAL: 0.6 mg/dL (ref 0.3–1.2)
BUN: 15 mg/dL (ref 6–20)
CHLORIDE: 110 mmol/L (ref 101–111)
CO2: 22 mmol/L (ref 22–32)
Calcium: 7.7 mg/dL — ABNORMAL LOW (ref 8.9–10.3)
Creatinine, Ser: 1.33 mg/dL — ABNORMAL HIGH (ref 0.61–1.24)
GFR, EST AFRICAN AMERICAN: 53 mL/min — AB (ref >60)
GFR, EST NON AFRICAN AMERICAN: 46 mL/min — AB (ref >60)
Glucose, Bld: 81 mg/dL (ref 65–99)
POTASSIUM: 3.6 mmol/L (ref 3.5–5.1)
Sodium: 138 mmol/L (ref 135–145)
TOTAL PROTEIN: 5.4 g/dL — AB (ref 6.5–8.1)

## 2015-02-05 LAB — CBC
HEMATOCRIT: 27.4 % — AB (ref 39.0–52.0)
Hemoglobin: 8.2 g/dL — ABNORMAL LOW (ref 13.0–17.0)
MCH: 25.5 pg — AB (ref 26.0–34.0)
MCHC: 29.9 g/dL — AB (ref 30.0–36.0)
MCV: 85.1 fL (ref 78.0–100.0)
PLATELETS: 267 10*3/uL (ref 150–400)
RBC: 3.22 MIL/uL — ABNORMAL LOW (ref 4.22–5.81)
RDW: 17.8 % — AB (ref 11.5–15.5)
WBC: 6.7 10*3/uL (ref 4.0–10.5)

## 2015-02-05 NOTE — Telephone Encounter (Signed)
Thanks .Marland Kitchen. Looks like we may have to abandon anticoagulation soon, based on his recurrent bleeding.  Dr. HRexene Edison

## 2015-02-05 NOTE — Evaluation (Signed)
Physical Therapy Evaluation Patient Details Name: Corey Mcdonald MRN: [id number] DOB: August 10, 1923 Today's Date: 02/05/2015   History of Present Illness  81 y.o. male with a history of atrial fibrillation, CAD, CABG, PAD, AAA, hyperlipidemia, hypertension, macular degeneration, peptic ulcer disease, and restless leg syndrome who was brought to the ED due to AMS. The patient was admitted 01/27/2015 through 01/29/2015 due to upper GI bleed secondary to therapy with Eliquis and aspirin in preparation for electrical cardioversion for atrial fibrillation. He was followed by Dr. Ewing SchleinMagod, who advised him to hold the aspirin and to come to the hospital if his hemoglobin dropped below 8.5.  Clinical Impression  Pt admitted with above diagnosis. Pt currently with functional limitations due to the deficits listed below (see PT Problem List). Pt was able to ambulate with RW but was unsteady.  Pt deconditioned and given that he has to be I for his I living facility, recommend SNF with rehab prior to d/c back to I living.  Pt agrees.  Will follow acutely.   Pt will benefit from skilled PT to increase their independence and safety with mobility to allow discharge to the venue listed below.      Follow Up Recommendations SNF;Supervision/Assistance - 24 hour    Equipment Recommendations  None recommended by PT    Recommendations for Other Services       Precautions / Restrictions Precautions Precautions: Fall Precaution Comments: legally blind Restrictions Weight Bearing Restrictions: No      Mobility  Bed Mobility Overal bed mobility: Needs Assistance Bed Mobility: Supine to Sit     Supine to sit: Min assist     General bed mobility comments: needed assist for LEs off bed and for elevation of trunk.  Transfers Overall transfer level: Needs assistance Equipment used: Rolling walker (2 wheeled) Transfers: Sit to/from Stand Sit to Stand: Min assist         General transfer comment: cues for  hand placement.  Incr time with use of momentum to get up.     Ambulation/Gait Ambulation/Gait assistance: Min assist Ambulation Distance (Feet): 45 Feet Assistive device: Rolling walker (2 wheeled) Gait Pattern/deviations: Step-through pattern;Decreased stride length Gait velocity: decresaed Gait velocity interpretation: Below normal speed for age/gender General Gait Details: pt generally unsteady today needing steadying assist with RW.    Stairs            Wheelchair Mobility    Modified Rankin (Stroke Patients Only)       Balance Overall balance assessment: Needs assistance Sitting-balance support: No upper extremity supported;Feet supported Sitting balance-Leahy Scale: Good     Standing balance support: Bilateral upper extremity supported;During functional activity Standing balance-Leahy Scale: Poor Standing balance comment: pt cannot stand without UE support.  Unsteady without min guard asssit.                               Pertinent Vitals/Pain Pain Assessment: No/denies pain  95-100% on RA, 171/95, HR 93 bpm with 2 episodes up to 157 bpm when mobilizing.  Unsure if artifact as HR back to 94 bpm at end of ambulation.      Home Living Family/patient expects to be discharged to:: Private residence Living Arrangements: Alone Available Help at Discharge: Family;Available PRN/intermittently Type of Home: Independent living facility Home Access: Level entry     Home Layout: One level Home Equipment: Walker - 2 wheels;Bedside commode;Grab bars - tub/shower;Hand held shower head;Cane - single point;Wheelchair - manual  Additional Comments: pt legally blind but does everything on own. does have staff that cleans place every other week.    Prior Function Level of Independence: Independent         Comments: daughter assists with grocery shopping and medicine management     Hand Dominance   Dominant Hand: Right    Extremity/Trunk Assessment    Upper Extremity Assessment: Defer to OT evaluation           Lower Extremity Assessment: Generalized weakness      Cervical / Trunk Assessment: Normal  Communication   Communication: No difficulties  Cognition Arousal/Alertness: Awake/alert Behavior During Therapy: WFL for tasks assessed/performed Overall Cognitive Status: Within Functional Limits for tasks assessed                      General Comments      Exercises General Exercises - Lower Extremity Ankle Circles/Pumps: AROM;Both;10 reps;Seated Long Arc Quad: AROM;Both;10 reps;Seated Hip Flexion/Marching: AROM;Both;10 reps;Seated      Assessment/Plan    PT Assessment Patient needs continued PT services  PT Diagnosis Difficulty walking;Generalized weakness   PT Problem List Decreased strength;Decreased activity tolerance;Decreased mobility;Decreased balance  PT Treatment Interventions DME instruction;Gait training;Stair training;Functional mobility training;Therapeutic activities;Therapeutic exercise   PT Goals (Current goals can be found in the Care Plan section) Acute Rehab PT Goals Patient Stated Goal: home PT Goal Formulation: With patient Time For Goal Achievement: 02/19/15 Potential to Achieve Goals: Good    Frequency Min 3X/week   Barriers to discharge Decreased caregiver support      Co-evaluation               End of Session Equipment Utilized During Treatment: Gait belt Activity Tolerance: Patient tolerated treatment well Patient left: in chair;with call bell/phone within reach;with chair alarm set Nurse Communication: Mobility status         Time: 1610-9604 PT Time Calculation (min) (ACUTE ONLY): 33 min   Charges:   PT Evaluation $Initial PT Evaluation Tier I: 1 Procedure PT Treatments $Gait Training: 8-22 mins   PT G CodesTawni Millers F 2015-02-20, 10:47 AM [facility] [phone] (pager)

## 2015-02-05 NOTE — Progress Notes (Signed)
CSW met with pt and family to discuss PT recommendation for SNF. Pt and family not agreeable to SNF placement.  Pt and family agree that they would like for pt to return to [facility]- agreeable to home health if needed.  CSW signing off  Domenica Reamer, [place] Social Worker [phone]

## 2015-02-05 NOTE — Op Note (Signed)
[facility] [address] [place], [id number]   COLONOSCOPY PROCEDURE REPORT     EXAM DATE: 02/04/2015  PATIENT NAME:      Corey Mcdonald, Corey Mcdonald           MR #:      [id number] BIRTHDATE:       April 30, 1923      VISIT #:     [id number]  ATTENDING:     Charlott RakesVincent Xuan Mateus, MD     STATUS:     inpatient REFERRING MD:      hospital team ASA CLASS:        Class III  INDICATIONS:  The patient is a 81 yr old male here for a colonoscopy due to GI bleed. PROCEDURE PERFORMED:     Colonoscopy, diagnostic MEDICATIONS:     Monitored anesthesia care and Per Anesthesia  ESTIMATED BLOOD LOSS:     None  CONSENT: The patient understands the risks and benefits of the procedure and understands that these risks include, but are not limited to: sedation, allergic reaction, infection, perforation and/or bleeding. Alternative means of evaluation and treatment include, among others: physical exam, x-rays, and/or surgical intervention. The patient elects to proceed with this endoscopic procedure.  DESCRIPTION OF PROCEDURE: During intra-op preparation period all mechanical & medical equipment was checked for proper function. Hand hygiene and appropriate measures for infection prevention was taken. After the risks, benefits and alternatives of the procedure were thoroughly explained, Informed consent was verified, confirmed and timeout was successfully executed by the treatment team. A digital exam revealed no abnormalities of the rectum.      The Pentax Ped Colon [id number] endoscope was introduced through the anus and advanced to the cecum, which was identified by both the appendix and ileocecal valve. The prep was fair.. The instrument was then slowly withdrawn as the colon was fully examined. Estimated blood loss is zero unless otherwise noted in this procedure report. Multiple small and large sigmoid diverticuli seen. Small nonbleeding AVM in ascending colon. Small  benign-appearing colon polyp that was not removed due to need for ongoing anticoagulation. Terminal ileum normal in appearance. Loop reduction and abdominal pressure necessary in order to reach the cecum.   Dark black and brown stool prevented complete visualization of the rectum.    Retroflexed views revealed medium-sized internal hemorrhoids.  The scope was then completely withdrawn from the patient and the procedure terminated.     ADVERSE EVENTS:      There were no immediate complications.   IMPRESSIONS:     Sigmoid diverticulosis Internal hemorrhoids No active bleeding  RECOMMENDATIONS:     Follow H/Hs; Ok to resume anticoagulation from GI standpoint but ideally would use a single agent if ok with cardiology; Advance diet   Charlott RakesVincent Lyana Asbill, MD eSigned:  Charlott RakesVincent Amnah Breuer, MD 02/04/2015 2:05 PM   cc:  CPT CODES: ICD CODES:  The ICD and CPT codes recommended by this software are interpretations from the data that the clinical staff has captured with the software.  The verification of the translation of this report to the ICD and CPT codes and modifiers is the sole responsibility of the health care institution and practicing physician where this report was generated.  [employer] will not be held responsible for the validity of the ICD and CPT codes included on this report.  AMA assumes no liability for data contained or not contained herein. CPT is a Publishing rights managerregistered trademark of the [employer].  PATIENT NAME:  Corey Mcdonald, Corey Mcdonald MR#: [id number]

## 2015-02-05 NOTE — Progress Notes (Signed)
Patient ID: Corey CaroliCarl Mcdonald, male   DOB: [birth year], 81 y.o.   MRN: [id number] [facility]  Corey CaroliCarl Mcdonald 81 y.o. [birth year]   Subjective: Patient resting with CPAP machine on. No further bleeding. Son at bedside. Doctor from cards team in room.  Objective: Vital signs: Filed Vitals:   02/05/15 0839 02/05/15 1100  BP: 169/72 91/60  Pulse: 87 152  Temp:  97.3 F (36.3 C)  Resp: 14 20    Physical Exam: Gen: lethargic, elderly, frail, no acute distress  CV: RRR Chest: CTA B Abd: soft, nontender, nondistended, +BS   Lab Results:  Recent Labs  02/03/15 0905 02/05/15 0303  NA 140 138  K 4.5 3.6  CL 110 110  CO2 22 22  GLUCOSE 111* 81  BUN 28* 15  CREATININE 1.35* 1.33*  CALCIUM 8.3* 7.7*    Recent Labs  02/03/15 0905 02/05/15 0303  AST 20 18  ALT 22 17  ALKPHOS 55 55  BILITOT 1.1 0.6  PROT 5.8* 5.4*  ALBUMIN 2.8* 2.6*    Recent Labs  02/03/15 0905  02/04/15 0230 02/04/15 1804 02/05/15 0303  WBC 6.9  --  8.8 7.7 6.7  NEUTROABS 4.9  --  6.4  --   --   HGB 8.2*  < > 9.1* 8.6* 8.2*  HCT 26.6*  --  30.3* 27.2* 27.4*  MCV 84.2  --  84.6 84.5 85.1  PLT 247  --  257 277 267  < > = values in this interval not displayed.    Assessment/Plan: Obscure GI bleed - no further bleeding. Cardiology evaluating today to decide on anticoagulation (single vs dual). No further GI recs. Will sign off. Call if questions.   Adalai Perl C. 02/05/2015, 12:38 PM  Pager [phone]  If no answer or after 5 PM call [phone]

## 2015-02-05 NOTE — Care Management Important Message (Signed)
Important Message  Patient Details  Name: Agustina CaroliCarl Cosman MRN: [id number] Date of Birth: 07-Dec-1923   Medicare Important Message Given:  Yes    Rayvon CharSTUTTS, Sheleen Conchas G 02/05/2015, 2:37 PMImportant Message  Patient Details  Name: Agustina CaroliCarl Bangerter MRN: [id number] Date of Birth: 07-Dec-1923   Medicare Important Message Given:  Yes    Lylee Corrow G 02/05/2015, 2:36 PM

## 2015-02-05 NOTE — Progress Notes (Signed)
Patient placed on CPAP with no complications. Patient is tolerating it well and RT will continue to monitor.

## 2015-02-05 NOTE — Consult Note (Signed)
CARDIOLOGY CONSULT NOTE   Patient ID: Corey Mcdonald MRN: [id number] DOB/AGE: [birth year] 81 y.o.  Admit date: 02/02/2015  Primary Physician   [name],[name], MD Primary Cardiologist   Dr. Rennis GoldenHilty Reason for Consultation   Anticoagulation for afib Referring physician [name], MD  HPI: Corey Mcdonald is a 81 y.o. male with a history of CAD status post CABG, PAD, and abdominal aortic aneurysm status post endovascular repair, HTN, HL, macular degeneration, BPH, sleep apnea on CPAP, PUD, restless legs syndrome and chronic atrial fibrillation on Elliquis who consulted for anticoagulation management.   Previously the patient has a history of PAF (previously on warfarin but was taken off  due to some concern about high fall risk and legal blindness). He was in afib when seen by Dr. Rennis GoldenHilty 11/14/2014. He was started on low-dose Eliquis 2.5 mg daily as his GFR is around 3250 and age is [age] . He was again seen by Dr. Rennis GoldenHilty 12/19/2014 and diagnosis of persistent A. fib was made.   His scheduled cardioversion 01/02/2015 was canceled at same day due to an adequate anticoagulation felt by Dr. Mayford Knifeurner. At that time his Creatinine was 1.2 and last weight 92kg. Discussed with patient's daughter and his Eliquis increased to 5 mg twice a day. The plan made to do cardioversion on December 11 after an- interrupted anticoagulation for one month.  However patient was admitted 01/27/2015 to 01/29/2015 for melena and anemia. GI consulted and EGD showed small hiatal hernia without any active bleeding or source seen. No evidence of bleeding during hospitalization. Resume Eliquis and rescheduled cardioversion.  Reiewing office visit note of 01/20/15 and discharge summary of 01/29/15 since should not be taking aspirin while on Eliquis. However, seems like patient is taking aspirin during this period of time due to miscommunication between patient's daughter and son.   Patient again admitted 02/03/2015 for continuous  melena, but her mental status and hypotension. Colonoscopy 02/04/2015 showed sigmoid diverticulosis. Small AVM. No active bleeding. If rebleeds, then may need reevaluation of aneursym graft but defer to surgery/cards. GI recommended single agent (aspirin or Eliquis) for anticoagulation. Now cardiology is consulted for further management.  Patient was very independent prior to recent hospitalization. Denies chest pain, palpitation, lower approximately edema, orthopnea, PND or syncope. Currently sleeping on CPAP machine and son at bedside. The history obtained from previous chart and with son.  Telemetry shows sinus rhythm with a rate mostly in 70s. However, this morning rate was in 110 now trending down. He also has few pause, longest 2.3 second. BP elevated this morning to 169/72, however last reading 91/60. On Lopressor 12.5 mg twice a day. He is not given any aspirin or Eliquis during this admission as well as no DVT prophylaxis.  Past Medical History  Diagnosis Date  . ABDOMINAL AORTIC ANEURYSM 09/15/2008  . Atrial fibrillation (HCC) 10/10/2006  . Atrial flutter (HCC) 08/18/2006  . BENIGN PROSTATIC HYPERTROPHY, HX OF 08/18/2006  . COLONIC POLYPS, HX OF 08/18/2006  . HYPERLIPIDEMIA 12/04/2009  . HYPERTENSION 08/18/2006  . HYPOTENSION 09/04/2008  . MACULAR DEGENERATION 08/18/2006  . PEPTIC ULCER DISEASE, HX OF 08/18/2006  . RESTLESS LEGS SYNDROME 12/04/2009  . SLEEP APNEA 08/18/2006  . VERTIGO 07/28/2008  . WEAKNESS 06/23/2008  . CORONARY ARTERY DISEASE     non-ST segment MI, March 15, 2010  . Anemia January 2012    admitted with acute MI March 15, 2010 and has significant acute blood loss anemia requiring transfusions of two units of packed RBCs.  Status  post upper endoscopy March 22, 2010 without High-Risk bleeding lesion.  History of peptic ulcer disease  . Myocardial infarction (HCC) 02/2010  . Leg pain   . AAA (abdominal aortic aneurysm) (HCC)   . NSTEMI (non-ST elevated myocardial  infarction) (HCC) 2010  . PAD (peripheral artery disease) (HCC)   . PVD (peripheral vascular disease) (HCC)   . History of Doppler ultrasound 05/2011    h/o claudication, stable ABIs  . History of nuclear stress test 2007    persantine; normal, low risk      Past Surgical History  Procedure Laterality Date  . Inguinal hernia repair      Left inguinal  . Coronary artery bypass graft  12/1995    x5; LIMA to ALD, vein graft to RCA (now occluded),SVG to OM, SVG to diagonal  . Abdominal aortic aneurysm repair      stenting   . Cardiac catheterization  02/2010    r/t NSTEMI; loss of SVG to RCA  . Transthoracic echocardiogram  04/2011    EF 55-60%; mild LVH; grade 1 diastolic dysfunction  . Esophagogastroduodenoscopy N/A 01/28/2015    Procedure: ESOPHAGOGASTRODUODENOSCOPY (EGD);  Surgeon: Vida Rigger, MD;  Location: [facility] ENDOSCOPY;  Service: Endoscopy;  Laterality: N/A;  . Colonoscopy with propofol N/A 02/04/2015    Procedure: COLONOSCOPY WITH PROPOFOL;  Surgeon: Charlott Rakes, MD;  Location: [facility] ENDOSCOPY;  Service: Endoscopy;  Laterality: N/A;    Allergies  Allergen Reactions  . Cilostazol Other (See Comments)    Adverse reaction per patient  . Doxazosin Other (See Comments)    INTOLERANCE     I have reviewed the patient's current medications . antiseptic oral rinse  7 mL Mouth Rinse BID  . atorvastatin  20 mg Oral q1800  . metoprolol tartrate  12.5 mg Oral BID  . pantoprazole  40 mg Oral Q1200  . sodium chloride  3 mL Intravenous Q12H  . tamsulosin  0.4 mg Oral Daily   . sodium chloride 40 mL/hr at 02/04/15 2247   ondansetron **OR** ondansetron (ZOFRAN) IV   . sodium chloride 40 mL/hr at 02/04/15 2247    Prior to Admission medications   Medication Sig Start Date End Date Taking? Authorizing Provider  acetaminophen (TYLENOL) 325 MG tablet Take 650 mg by mouth every 6 (six) hours as needed for moderate pain.    Yes Historical Provider, MD  apixaban (ELIQUIS) 5 MG TABS tablet  Take 1 tablet (5 mg total) by mouth 2 (two) times daily. 01/02/15  Yes Quintella Reichert, MD  aspirin EC 81 MG tablet Take 81 mg by mouth daily.   Yes Historical Provider, MD  atorvastatin (LIPITOR) 20 MG tablet Take 1 tablet (20 mg total) by mouth daily. Patient taking differently: Take 20 mg by mouth daily at 6 PM.  02/28/14  Yes Gordy Savers, MD  docusate sodium (COLACE) 100 MG capsule Take 200 mg by mouth at bedtime.    Yes Historical Provider, MD  metoprolol tartrate (LOPRESSOR) 25 MG tablet TAKE ONE TABLET BY MOUTH TWICE DAILY. 01/19/15  Yes Chrystie Nose, MD  Multiple Vitamin (MULTIVITAMIN) tablet Take 1 tablet by mouth daily.    Yes Historical Provider, MD  nitroGLYCERIN (NITROSTAT) 0.4 MG SL tablet Place 1 tablet (0.4 mg total) under the tongue every 5 (five) minutes as needed. FOR CHEST PAIN 01/20/15  Yes Chrystie Nose, MD  pantoprazole (PROTONIX) 40 MG tablet TAKE ONE TABLET BY MOUTH ONCE DAILY Patient taking differently: TAKE 40 MG BY MOUTH TWICE  DAILY 12/18/14  Yes Chrystie Nose, MD  tamsulosin (FLOMAX) 0.4 MG CAPS capsule TAKE ONE CAPSULE BY MOUTH ONCE DAILY Patient taking differently: TAKE ONE CAPSULE BY MOUTH ONCE DAILY AT BEDTIME 01/19/15  Yes Gordy Savers, MD     Social History   Social History  . Marital Status: Married    Spouse Name: N/A  . Number of Children: 2  . Years of Education: N/A   Occupational History  . Not on file.   Social History Main Topics  . Smoking status: Former Smoker -- 1.00 packs/day for 40 years    Types: Cigarettes    Quit date: 02/22/1987  . Smokeless tobacco: Never Used  . Alcohol Use: No  . Drug Use: No  . Sexual Activity: Not on file   Other Topics Concern  . Not on file   Social History Narrative    Family Status  Relation Status Death Age  . Mother Deceased 42    heart problems  . Father Deceased 100    pneumonia, CVA  . Sister Alive   . Sister Deceased 44    Heart   Family History  Problem Relation Age  of Onset  . Heart disease Mother   . Heart disease Father   . Heart disease Sister   . Macular degeneration Sister   . Heart disease Sister   . Stroke Father   . Stroke Brother   . Hypertension Sister   . Heart Problems Sister     x2     ROS:  Full 14 point review of systems complete and found to be negative unless listed above.  Physical Exam: Blood pressure 91/60, pulse 152, temperature 97.3 F (36.3 C), temperature source Oral, resp. rate 20, height 5\' 11"  (1.803 m), weight 200 lb (90.719 kg), SpO2 98 %.  General: frail elderly  male in no acute distress. Sleeping on CPAP.  Head: Eyes PERRLA, No xanthomas. Normocephalic and atraumatic, oropharynx without edema or exudate.  Lungs: Resp regular and unlabored, CTA. Heart: Ir Ir  no s3, s4, or murmurs..   Neck: No carotid bruits. No lymphadenopathy. No JVD. Abdomen: Bowel sounds present, abdomen soft and non-tender without masses or hernias noted. Msk:  No spine or cva tenderness. No weakness, no joint deformities or effusions. Extremities: No clubbing, cyanosis or edema. DP/PT/Radials 2+ and equal bilaterally. Neuro: Alert and oriented X 3. No focal deficits noted. Psych:  Good affect, responds appropriately Skin: No rashes or lesions noted.  Labs:   Lab Results  Component Value Date   WBC 6.7 02/05/2015   HGB 8.2* 02/05/2015   HCT 27.4* 02/05/2015   MCV 85.1 02/05/2015   PLT 267 02/05/2015    Recent Labs  02/03/15 0905  INR 1.45    Recent Labs Lab 02/05/15 0303  NA 138  K 3.6  CL 110  CO2 22  BUN 15  CREATININE 1.33*  CALCIUM 7.7*  PROT 5.4*  BILITOT 0.6  ALKPHOS 55  ALT 17  AST 18  GLUCOSE 81  ALBUMIN 2.6*   VITAMIN B-12  Date/Time Value Ref Range Status  01/27/2015 10:02 PM 386 180 - 914 pg/mL Final    Comment:    (NOTE) This assay is not validated for testing neonatal or myeloproliferative syndrome specimens for Vitamin B12 levels.    FERRITIN  Date/Time Value Ref Range Status  01/27/2015  10:02 PM 21* 24 - 336 ng/mL Final   TIBC  Date/Time Value Ref Range Status  01/27/2015 10:02 PM  349 250 - 450 ug/dL Final   IRON  Date/Time Value Ref Range Status  01/27/2015 10:02 PM 18* 45 - 182 ug/dL Final    Echo: 1/61/0960 LV EF: 55% -  60%  ------------------------------------------------------------ Indications:   Chest pain 786.51.  ------------------------------------------------------------ History:  PMH:  Coronary artery disease. Risk factors: Hyperlipidemia. Hypertension.  ------------------------------------------------------------ Study Conclusions  - Left ventricle: The cavity size was normal. Wall thickness was increased in a pattern of mild LVH. Systolic function was normal. The estimated ejection fraction was in the range of 55% to 60%. Moderate hypokinesis of the basal-midinferolateral and inferior myocardium. Doppler parameters are consistent with abnormal left ventricular relaxation (grade 1 diastolic dysfunction). - Mitral valve: Calcified annulus. Mildly thickened leaflets . - Atrial septum: No defect or patent foramen ovale was identified.  ECG:  01/27/2015 at last admission.  Vent. rate 120 BPM PR interval * ms QRS duration 100 ms QT/QTc 338/477 ms P-R-T axes * 33 22  Radiology:  No results found.  ASSESSMENT AND PLAN:  1.   Persistent atrial fibrillation -CHADSVASC 4. Diffusely cardioversion has canceled due to adequate anticoagulation with Eliquis. Now has a recurrent GI bleed. GI commended single agent (aspirin or Eliquis). - Per Dr. Blanchie Dessert note (11/14/14) previously on warfarin but was taken off  due to some concern about high fall risk and legal blindness. - Will discuss with M.D further management. Could consider TEE/cardioversion versus anticoagulation for one month versus rate control versus antiarrhythmic. -Telemetry showed A. fib at the rate of mostly in 70s. However, this morning rate was in 110 now trending  down. He also has few pause, longest 2.3 second. BP elevated this morning to 169/72, however last reading 91/60. On Lopressor 12.5 mg twice a day. He is not given any aspirin or Eliquis during this admission as well as no DVT prophylaxis. - No EKG done this admission. Will get today.  2. Coronary artery disease status post four-vessel CABG with an occluded graft to the right coronary in 1997 - Per son no exertional chest pain. - Continue BB and statin  3. Bilateral claudication with reduced ABIs - f/u with Dr. Rennis Golden as outpatient.  4. Recurrent GI bleed/  Acute blood loss anemia - As above - EGD showed small hiatal hernia without any active bleeding or source seen.  - Colonoscopy 02/04/2015 showed sigmoid diverticulosis. Small AVM. No active bleeding. If rebleeds, then may need reevaluation of aneursym graft but defer to surgery/cards.  5. OSA on CPAP  6. HTN - On high to low side. Last reading 91/60. Follow closely.  7. Chronic diastolic CHF (congestive heart failure) (HCC) - Last echocardiogram 05/10/2011 showed left ventricular function of 55-60%, grade 1 diastolic dysfunction.  - Appears euvolemic.  8. AKI - In setting of severe anemia.  - Cr stable at ~1.3  9. Abdominal aortic aneurysm status post stent grafting  Signed: Bhagat,Bhavinkumar, PA 02/05/2015, 12:31 PM Pager [phone]  Co-Sign MD  Patient seen and examined. Agree with assessment and plan. Mr. Ledet is an 39 -year-old white male who underwent CABG revascularization surgery in 1997 and has documented occlusion of the graft supplying the RCA which led to a non-ST segment elevation MI 2010.  The patient has peripheral vascular disease and is status post abdominal aortic stent grafting.  He has a history of PAF and remotely had been on Coumadin but due to high risk of falls and visual impairment.  Apparently this had been discontinued.  Recently, he had been placed on eliquis for  anticoagulation and there have been  several planned attempts for him to undergo cardioversion as noted above.  When his dose of eliquis was increased from low-dose 2.5 mg twice a day to 5 mg  Bid and he was taken simultaneous aspirin, he developed recurrent GI bleed.  His last dose of eliquis was on December 12 in the AM, but over the weekend the patient had developed melena and bright red blood per rectum.  He presented this admission with a hemoglobin of 6.9 and received packed red blood cell transfusion.  He had recently undergone endoscopy, and on 1214 underwent colonoscopy  showed sigmoid diverticulosis and small AVMs without active bleeding.  Since his hemoglobin had risen to 9.1, subsequent blood draws have shown gradual slight reduction of his hemoglobin , which is down to 8.2 today.  His atrial fibrillation rate is stable in the 90s on low-dose metoprolol.  Presently, the patient is not anticoagulated since his last dose of eliquis was 3 days ago.  With his continued hemoglobin drop, I would not reinstitute anticoagulation therapy immediately, but wait until his hemoglobin stabilizes and trends upward.  Recommend daily CBC.  I would avoid combination therapy with aspirin and with his recurrent GI bleeds he may need the reduced dose at 2.5 twice a day despite his weight and serum creatinine dictating the higher dose.  If  the plan is to consider future cardioversion a TEE guided cardioversion would be necessary to make certain he does not have any thrombus burden.  Tentatively, the patient seems to be tolerating atrial fibrillation well and is not significant asymptomatic and he may be able to be treated with rate control only, rather than attempt at cardioversion back to sinus rhythm.  I will asked that the patient be seen early next week by Dr. Rennis Golden in the office for final recommendations and have scheduled the patient for an office visit with him on Monday December 19.    Lennette Bihari, MD, [facility] 02/05/2015 2:11 PM

## 2015-02-05 NOTE — Progress Notes (Signed)
Corey Mcdonald  Corey Mcdonald WUJ:[id number] DOB: [birth year] DOA: 02/02/2015 PCP: Rogelia Boga, MD  Admit HPI / Brief Narrative: Corey Mcdonald is a 81 y.o. WM PMHx Atrial Fibrillation on Eliquis, HTN, CAD native artery, CABG, PAD, AAA, HLD, OSA on CPAP, PUD, RLS,  Brought to the emergency department due to change in mental status. The patient was recently admitted on 01/27/2015 2 01/29/2015 due to upper GI bleed secondary to therapy with Eliquis and aspirin in preparation for electrical cardioversion for atrial fibrillation with Dr. Rennis Golden. He was followed today by Dr. Ewing Schlein, who advised them to hold the aspirin and to come to the hospital if his hemoglobin drop below 8.5 for follow-up endoscopic studies.  Per patient's son, the patient has continued to have melena. He does states that today after dinner, the patient felt very weak, then became confused, blood pressure checked by his daughter found him to be hypotensive in the 80s/60s range and they called EMS, who brought the patient to the emergency department. Patient is currently in no acute distress.   HPI/Subjective: 12/15 A/O 4, NAD. Per son on 12/7 patient had EGD negative for bleeding source.  Assessment/Plan: UGI bleed/ Acute blood loss anemia Continue packed RBCs transfusion. -Hold Eliquis and aspirin. Until cardiology sees today. I spoke with son at length and explained why I recommend only full dose aspirin + continued rate control at least for a minimum of 2 weeks. -Continue  Protonix 40 mg  daily  -S/P EGD and colonoscopy within last 30 days without definitive source of bleeding. See colonoscopy results below  Essential hypertension/Hypotension  -See diastolic CHF  CAD native artery -See A. fib -See upper GI bleed   Chronic Diastolic CHF -Last echocardiogram in 05/10/2011 -Continue Metoprolol at reduced dose, 12.5 mg BID  Chronic Atrial fibrillation (HCC) -See diastolic  CHF -Would hold all anticoagulation for at least 2 weeks except for a full dose aspirin. Patient has proven he will bleed on anticoagulation.  -Currently rate controlled on Metoprolol  OSA on CPAP -Continue CPAP ventilation at an 8 cm of water pressure.  BPH (benign prostatic hyperplasia) -Continue Flomax 0.4 mg daily  Acute Renal failure -Most likely secondary to acute blood loss -Continues to improve with fluids. Continue normal saline 58ml/hr     Code Status: FULL Family Communication: Son present at time of exam Disposition Plan: Per GI    Consultants: Dr.Marc Maryjane Hurter [facility]    Procedure/Significant Events: 12/7 EGD;-Small hiatal hernia with proximal fibrous ring -Duodenal c-Loop ring not significant able to pass scope easily- EGD without signs of bleeding 12/14 colonoscopy;Sigmoid diverticulosis; Internal hemorrhoids -No active bleeding   Culture NA  Antibiotics: NA  DVT prophylaxis: SCD   Devices NA   LINES / TUBES:  NA    Continuous Infusions: . sodium chloride 40 mL/hr at 02/04/15 2247    Objective: VITAL SIGNS: Temp: 97.3 F (36.3 C) (12/15 1100) Temp Source: Oral (12/15 1100) BP: 91/60 mmHg (12/15 1100) Pulse Rate: 152 (12/15 1100) SPO2; FIO2:   Intake/Output Summary (Last 24 hours) at 02/05/15 1227 Last data filed at 02/05/15 9562  Gross per 24 hour  Intake 1247.84 ml  Output    850 ml  Net 397.84 ml     Exam: General: A/O 4, NAD, No acute respiratory distress Eyes: Negative headache, eye pain, double vision,negative scleral hemorrhage ENT: Negative Runny nose, negative ear pain, negative gingival bleeding, Neck:  Negative scars, masses, torticollis, lymphadenopathy, JVD Lungs: Clear to  auscultation bilaterally without wheezes or crackles Cardiovascular: Irregular irregular rhythm and rate, without murmur gallop or rub normal S1 and S2 Abdomen:negative abdominal pain, nondistended, positive soft, bowel sounds, no rebound,  no ascites, no appreciable mass Extremities: No significant cyanosis, clubbing, or edema bilateral lower extremities Psychiatric:  Negative depression, negative anxiety, negative fatigue, negative mania  Neurologic:  Cranial nerves II through XII intact, tongue/uvula midline, all extremities muscle strength 5/5, sensation intact throughout, negative dysarthria, negative expressive aphasia, negative receptive aphasia.   Data Reviewed: Basic Metabolic Panel:  Recent Labs Lab 02/02/15 2202 02/02/15 2241 02/03/15 0905 02/05/15 0303  NA 138 137 140 138  K 5.0 4.6 4.5 3.6  CL 106 108 110 110  CO2  --  17* 22 22  GLUCOSE 130* 110* 111* 81  BUN 30* 28* 28* 15  CREATININE 1.40* 1.49* 1.35* 1.33*  CALCIUM  --  8.8* 8.3* 7.7*   Liver Function Tests:  Recent Labs Lab 02/02/15 2241 02/03/15 0905 02/05/15 0303  AST ALT ALKPHOS 63 55 55  BILITOT 0.7 1.1 0.6  PROT 6.6 5.8* 5.4*  ALBUMIN 3.2* 2.8* 2.6*   No results for input(s): LIPASE, AMYLASE in the last 168 hours. No results for input(s): AMMONIA in the last 168 hours. CBC:  Recent Labs Lab 02/02/15 2241 02/03/15 0905 02/03/15 1008 02/03/15 1600 02/04/15 0230 02/04/15 1804 02/05/15 0303  WBC 9.4 6.9  --   --  8.8 7.7 6.7  NEUTROABS 8.0* 4.9  --   --  6.4  --   --   HGB 6.9* 8.2* 8.7* 8.4* 9.1* 8.6* 8.2*  HCT 22.5* 26.6*  --   --  30.3* 27.2* 27.4*  MCV 83.6 84.2  --   --  84.6 84.5 85.1  PLT 298 247  --   --  257 277 267   Cardiac Enzymes: No results for input(s): CKTOTAL, CKMB, CKMBINDEX, TROPONINI in the last 168 hours. BNP (last 3 results)  Recent Labs  01/27/15 2202  BNP 272.0*    ProBNP (last 3 results) No results for input(s): PROBNP in the last 8760 hours.  CBG: No results for input(s): GLUCAP in the last 168 hours.  Recent Results (from the past 240 hour(s))  MRSA PCR Screening     Status: None   Collection Time: 02/03/15  1:39 AM  Result Value Ref Range Status   MRSA by PCR  NEGATIVE NEGATIVE Final    Comment:        The GeneXpert MRSA Assay (FDA approved for NASAL specimens only), is one component of a comprehensive MRSA colonization surveillance program. It is not intended to diagnose MRSA infection nor to guide or monitor treatment for MRSA infections.      Studies:  Recent x-ray studies have been reviewed in detail by the Attending Physician  Scheduled Meds:  Scheduled Meds: . antiseptic oral rinse  7 mL Mouth Rinse BID  . atorvastatin  20 mg Oral q1800  . metoprolol tartrate  12.5 mg Oral BID  . pantoprazole  40 mg Oral Q1200  . sodium chloride  3 mL Intravenous Q12H  . tamsulosin  0.4 mg Oral Daily    Time spent on care of this patient: 40 mins   WOODS, Roselind Messier , MD  [facility] Office  [phone] Pager - [phone]  On-Call/Text Page:      [email or web address]      password [email or web address]  If 7PM-7AM, please contact night-coverage [email or web address] Password [facility] 02/05/2015, 12:27  PM   LOS: 3 days   Care during the described time interval was provided by me .  I have reviewed this patient's available data, including medical history, events of Mcdonald, physical examination, and all test results as part of my evaluation. I have personally reviewed and interpreted all radiology studies.   Carolyne Littlesurtis Woods, MD [phone] Pager

## 2015-02-05 NOTE — Anesthesia Postprocedure Evaluation (Addendum)
Anesthesia Post Note  Patient: Corey Mcdonald  Procedure(s) Performed: Procedure(s) (LRB): COLONOSCOPY WITH PROPOFOL (N/A)  Patient location during evaluation: PACU Anesthesia Type: MAC Level of consciousness: awake and alert Pain management: pain level controlled Vital Signs Assessment: post-procedure vital signs reviewed and stable Respiratory status: spontaneous breathing, nonlabored ventilation, respiratory function stable and patient connected to nasal cannula oxygen Cardiovascular status: stable and blood pressure returned to baseline Anesthetic complications: no    Last Vitals:  Filed Vitals:   02/05/15 0834 02/05/15 0839  BP: 169/72 169/72  Pulse: 43 87  Temp: 36.4 C   Resp: 19 14    Last Pain:  Filed Vitals:   02/05/15 0839  PainSc: 0-No pain                 [name] S

## 2015-02-06 DIAGNOSIS — N179 Acute kidney failure, unspecified: Secondary | ICD-10-CM | POA: Diagnosis present

## 2015-02-06 DIAGNOSIS — N189 Chronic kidney disease, unspecified: Secondary | ICD-10-CM

## 2015-02-06 MED ORDER — METOPROLOL TARTRATE 25 MG PO TABS: 12.5000 mg | ORAL_TABLET | Freq: Two times a day (BID) | ORAL | Status: DC

## 2015-02-06 NOTE — Progress Notes (Signed)
Pt discharged per w/c with all belongings and discharge instructions per Nurse tech Son accompanied pt to home via private vehicle

## 2015-02-06 NOTE — Discharge Summary (Signed)
Physician Discharge Summary  Corey Mcdonald WGN:[id number] DOB: 01/17/1924 DOA: 02/02/2015  PCP: Rogelia Boga, MD  Admit date: 02/02/2015 Discharge date: 02/06/2015  Time spent: 40 minutes  Recommendations for Outpatient Follow-up:  UGI bleed/ Acute blood loss anemia Continue packed RBCs transfusion. -Hold Eliquis and aspirin. Until cardiology sees today. I spoke with son at length and explained why I recommend only full dose aspirin + continued rate control at least for a minimum of 2 weeks. -Continue Protonix 40 mg daily  -Counseled son and patient to hold on restarting any anticoagulation until they F/U Dr. Rennis Golden cardiologist on Monday 12/19 at 3:45 PM. -I Recommend that patient not restart any anticoagulation for at least 2 weeks, and then restart aspirin 325 mg daily OR Eliquis 2.5 mg  BID. Will leave the final decision to Dr. Rennis Golden cardiologist  Essential hypertension/Hypotension  -See diastolic CHF  CAD native artery -See A. fib -See upper GI bleed   Chronic Diastolic CHF -Last echocardiogram in 05/10/2011 -Continue Metoprolol at reduced dose, 12.5 mg BID. BP controlled on this dose. -Dr. Rennis Golden cardiologist to increase Metoprolol or add additional agents as needed   Chronic Atrial fibrillation (HCC) -See diastolic CHF -See upper GI bleed  -Currently rate controlled on Metoprolol  OSA on CPAP -Continue CPAP ventilation at an 8 cm of water pressure.  BPH (benign prostatic hyperplasia) -Continue Flomax 0.4 mg daily  Acute on Chronic Renal failure (baseline Cr~1.21-1.29) -Most likely secondary to acute blood loss -Upon discharge  Cr slightly above baseline, PCP and cardiologist to continue to monitor.  -Son states that patient has appointment with his PCP next week.     Discharge Diagnoses:  Principal Problem:   UGI bleed Active Problems:   Essential hypertension   Coronary atherosclerosis   Atrial fibrillation (HCC)   OSA on CPAP   BPH (benign  prostatic hyperplasia)   Acute blood loss anemia   Chronic atrial fibrillation (HCC)   Lactic acidosis   Chronic diastolic CHF (congestive heart failure) (HCC)   Acute renal failure (HCC)   CAD in native artery   Acute on chronic renal failure [facility])   Discharge Condition: Stable  Diet recommendation: Heart healthy  Filed Weights   02/02/15 2256 02/03/15 0103 02/04/15 1141  Weight: 92.987 kg (205 lb) 91.1 kg (200 lb 13.4 oz) 90.719 kg (200 lb)    History of present illness:  Corey Mcdonald is a 81 y.o. WM PMHx Atrial Fibrillation on Eliquis, HTN, CAD native artery, CABG, PAD, AAA, HLD, OSA on CPAP, PUD, RLS,  Brought to the emergency department due to change in mental status. The patient was recently admitted on 01/27/2015 2 01/29/2015 due to upper GI bleed secondary to therapy with Eliquis and aspirin in preparation for electrical cardioversion for atrial fibrillation with Dr. Rennis Golden. He was followed today by Dr. Ewing Schlein, who advised them to hold the aspirin and to come to the hospital if his hemoglobin drop below 8.5 for follow-up endoscopic studies.  Per patient's son, the patient has continued to have melena. He does states that today after dinner, the patient felt very weak, then became confused, blood pressure checked by his daughter found him to be hypotensive in the 80s/60s range and they called EMS, who brought the patient to the emergency department. Patient is currently in no acute distress. During his hospitalization all anticoagulation was discontinued i.e. Eliquis and aspirin, and a colonoscopy performed which showed no definitive site of bleeding. It did show some diverticulosis and internal hemorrhoids. Patient's hemoglobin currently  is stable in the low to mid 8 range, with no signs of overt bleeding.    Consultants: Dr.Marc Maryjane Hurter [facility]    Procedure/Significant Events: 12/7 EGD;-Small hiatal hernia with proximal fibrous ring -Duodenal c-Loop ring not significant able  to pass scope easily- EGD without signs of bleeding 12/14 colonoscopy;Sigmoid diverticulosis; Internal hemorrhoids -No active bleeding    Discharge Exam: Filed Vitals:   02/06/15 0001 02/06/15 0400 02/06/15 0700 02/06/15 0931  BP: 148/98 166/77 150/79 142/84  Pulse:  76 93 120  Temp: 99.1 F (37.3 C) 98.9 F (37.2 C) 98 F (36.7 C)   TempSrc: Oral Oral Oral   Resp: Height:      Weight:      SpO2: 98% 96% 100%     General: A/O 4, NAD, No acute respiratory distress Eyes: Negative headache, eye pain, double vision,negative scleral hemorrhage ENT: Negative Runny nose, negative ear pain, negative gingival bleeding, Neck: Negative scars, masses, torticollis, lymphadenopathy, JVD Lungs: Clear to auscultation bilaterally without wheezes or crackles Cardiovascular: Irregular irregular rhythm and rate, without murmur gallop or rub normal S1 and S2 Abdomen:negative abdominal pain, nondistended, positive soft, bowel sounds, no rebound, no ascites, no appreciable mass   Discharge Instructions     Medication List    STOP taking these medications        apixaban 5 MG Tabs tablet  Commonly known as:  ELIQUIS     aspirin EC 81 MG tablet      TAKE these medications        acetaminophen 325 MG tablet  Commonly known as:  TYLENOL  Take 650 mg by mouth every 6 (six) hours as needed for moderate pain.     atorvastatin 20 MG tablet  Commonly known as:  LIPITOR  Take 1 tablet (20 mg total) by mouth daily.     docusate sodium 100 MG capsule  Commonly known as:  COLACE  Take 200 mg by mouth at bedtime.     metoprolol tartrate 25 MG tablet  Commonly known as:  LOPRESSOR  Take 0.5 tablets (12.5 mg total) by mouth 2 (two) times daily.     multivitamin tablet  Take 1 tablet by mouth daily.     nitroGLYCERIN 0.4 MG SL tablet  Commonly known as:  NITROSTAT  Place 1 tablet (0.4 mg total) under the tongue every 5 (five) minutes as needed. FOR CHEST PAIN      pantoprazole 40 MG tablet  Commonly known as:  PROTONIX  TAKE ONE TABLET BY MOUTH ONCE DAILY     tamsulosin 0.4 MG Caps capsule  Commonly known as:  FLOMAX  TAKE ONE CAPSULE BY MOUTH ONCE DAILY       Allergies  Allergen Reactions  . Cilostazol Other (See Comments)    Adverse reaction per patient  . Doxazosin Other (See Comments)    INTOLERANCE    Follow-up Information    Follow up with Chrystie Nose, MD. Go on 02/09/2015.   Specialty:  Cardiology   Why:  :45 pm for close hospital follow up    Contact information:   [address] [place] [address] [phone]        The results of significant diagnostics from this hospitalization (including imaging, microbiology, ancillary and laboratory) are listed below for reference.    Significant Diagnostic Studies: No results found.  Microbiology: Recent Results (from the past 240 hour(s))  MRSA PCR Screening     Status: None  Collection Time: 02/03/15  1:39 AM  Result Value Ref Range Status   MRSA by PCR NEGATIVE NEGATIVE Final    Comment:        The GeneXpert MRSA Assay (FDA approved for NASAL specimens only), is one component of a comprehensive MRSA colonization surveillance program. It is not intended to diagnose MRSA infection nor to guide or monitor treatment for MRSA infections.      Labs: Basic Metabolic Panel:  Recent Labs Lab 02/02/15 2202 02/02/15 2241 02/03/15 0905 02/05/15 0303  NA 138 137 140 138  K 5.0 4.6 4.5 3.6  CL 106 108 110 110  CO2  --  17* 22 22  GLUCOSE 130* 110* 111* 81  BUN 30* 28* 28* 15  CREATININE 1.40* 1.49* 1.35* 1.33*  CALCIUM  --  8.8* 8.3* 7.7*   Liver Function Tests:  Recent Labs Lab 02/02/15 2241 02/03/15 0905 02/05/15 0303  AST 28 20 18   ALT 27 22 17   ALKPHOS 63 55 55  BILITOT 0.7 1.1 0.6  PROT 6.6 5.8* 5.4*  ALBUMIN 3.2* 2.8* 2.6*   No results for input(s): LIPASE, AMYLASE in the last 168 hours. No results for input(s): AMMONIA in  the last 168 hours. CBC:  Recent Labs Lab 02/02/15 2241 02/03/15 0905 02/03/15 1008 02/03/15 1600 02/04/15 0230 02/04/15 1804 02/05/15 0303  WBC 9.4 6.9  --   --  8.8 7.7 6.7  NEUTROABS 8.0* 4.9  --   --  6.4  --   --   HGB 6.9* 8.2* 8.7* 8.4* 9.1* 8.6* 8.2*  HCT 22.5* 26.6*  --   --  30.3* 27.2* 27.4*  MCV 83.6 84.2  --   --  84.6 84.5 85.1  PLT 298 247  --   --  257 277 267   Cardiac Enzymes: No results for input(s): CKTOTAL, CKMB, CKMBINDEX, TROPONINI in the last 168 hours. BNP: BNP (last 3 results)  Recent Labs  01/27/15 2202  BNP 272.0*    ProBNP (last 3 results) No results for input(s): PROBNP in the last 8760 hours.  CBG: No results for input(s): GLUCAP in the last 168 hours.     Signed:  Carolyne Littlesurtis Adaira Centola, MD [facility] [phone] pager

## 2015-02-06 NOTE — Progress Notes (Signed)
Went over discharge instructions with pt and Son since Pt is blind and cant read d/c instructions and when meds are next due and follow up visits

## 2015-02-09 ENCOUNTER — Encounter: Payer: Self-pay | Admitting: Internal Medicine

## 2015-02-09 ENCOUNTER — Ambulatory Visit (INDEPENDENT_AMBULATORY_CARE_PROVIDER_SITE_OTHER): Payer: Medicare Other | Admitting: Internal Medicine

## 2015-02-09 VITALS — BP 158/62 | HR 86 | Ht 71.0 in | Wt 208.0 lb

## 2015-02-09 DIAGNOSIS — I481 Persistent atrial fibrillation: Secondary | ICD-10-CM

## 2015-02-09 DIAGNOSIS — I5032 Chronic diastolic (congestive) heart failure: Secondary | ICD-10-CM

## 2015-02-09 DIAGNOSIS — Z9889 Other specified postprocedural states: Secondary | ICD-10-CM | POA: Diagnosis not present

## 2015-02-09 DIAGNOSIS — I4819 Other persistent atrial fibrillation: Secondary | ICD-10-CM

## 2015-02-09 DIAGNOSIS — K922 Gastrointestinal hemorrhage, unspecified: Secondary | ICD-10-CM | POA: Diagnosis not present

## 2015-02-09 DIAGNOSIS — Z951 Presence of aortocoronary bypass graft: Secondary | ICD-10-CM

## 2015-02-09 DIAGNOSIS — Z8679 Personal history of other diseases of the circulatory system: Secondary | ICD-10-CM

## 2015-02-09 NOTE — Patient Instructions (Signed)
Dr Rennis GoldenHilty has recommended making the following medication changes: START Aspirin 81 mg - take 1 tablet by mouth daily  Please keep previously scheduled appointment in February.  If you need a refill on your cardiac medications before your next appointment, please call your pharmacy. No

## 2015-02-09 NOTE — Progress Notes (Signed)
OFFICE NOTE  Chief Complaint:  Follow-up A. Fib, hospitalization  Primary Care Physician: [name],[name], MD  HPI:  Corey Mcdonald is an 81 year old gentleman with history of coronary bypass in 1997 and had a non-ST-elevation MI in 2010 which was due to the loss of vein graft to the right coronary. EF was 45%. The rest of his anatomy includes a LIMA to the LAD, a saphenous vein graft to an OM and a saphenous vein graft to a diagonal. The saphenous vein graft to the RCA was occluded in 2013. He also has hypertension which has been well controlled and dyslipidemia on Crestor. In addition, he had an abdominal aortic aneurysm and underwent stent grafting. He also has lower extremity claudication with stable ABIs in April 2013 with 0.77 on the left and 0.92 on the right, otherwise no other symptoms. He is being followed by Dr. early for this, who placed his stent graft. He's recently seen by him in June of this year and felt that his claudication is not lifestyle limiting and did not recommend any further treatment at this time. He does have a history of PAF but no recurrence recently but he is not on Coumadin due to high risk of falls and he has significant visual impairment.   Corey Mcdonald returns today for followup. He reports that he has been doing fairly well. He is having some difficulty with his wife who I guess is aching and may be declining. He's recently had problems with weight loss. He says that his appetite is good and he eats about the same but has lost over 30 pounds. His heart rate is noted to be slower today in blood pressure is borderline low. He also been complaining of night sweats recently.  I the pleasure see Corey Mcdonald back in the office today. He is overall doing fairly well although does report some progressive fatigue. He seems to have noted this over the past several years, not necessarily recently. He does have a history of paroxysmal atrial fibrillation in the  distant past but has been in sinus rhythm most of the time when I see him in the office. Today however he is in atrial fibrillation. He's been maintained on aspirin apparently due to visual impairment and high falls and was previously on warfarin. I had a discussion today in the office with his daughter who says that he is very compliant with his medicines as she arranges them for him and I did not feel that visual impairment would be something that she keep him from being on anticoagulation. He also is not having any falls. He in fact walks about a mile to mile and half every day.  Corey Mcdonald returns today for follow-up.  He remains in persistent atrial fibrillation. He was scheduled to have a cardioversion however it was correctly noted that he was under dosed and on 2.5 mg twice a day of Eliquis,  However should've been on the 5 mg twice daily dosing. Fortunately he's had no problems with this and was correctly switched to the 5 mg twice daily dose. This was as of November 11. We therefore will have to postpone his cardioversion until after December 11.  Corey Mcdonald returns for follow-up. Unfortunately he's been hospitalized twice for GI bleeding. The last occurrence required transfusion of 2 units packed red blood cells. This is after increasing his Eliquis from 2.5-5 mg as it was determined he was possibly under dosed. We have not yet been able to undergo cardioversion  and I'm a little leery about pursuing that now because of his ongoing bleeding. Is not clear that he could be chronically anticoagulated. Currently he is on no blood thinners. His bleeding has stopped. We discussed several options in the office today including aspirin only therapy and also considering the possibility of a Watchman left atrial appendage occluder device. Would require a short period of anticoagulation to have this placed, however it could pay dividends as far as reduction in stroke risk over the long-term.  PMHx:  Past  Medical History  Diagnosis Date  . ABDOMINAL AORTIC ANEURYSM 09/15/2008  . Atrial fibrillation (HCC) 10/10/2006  . Atrial flutter (HCC) 08/18/2006  . BENIGN PROSTATIC HYPERTROPHY, HX OF 08/18/2006  . COLONIC POLYPS, HX OF 08/18/2006  . HYPERLIPIDEMIA 12/04/2009  . HYPERTENSION 08/18/2006  . HYPOTENSION 09/04/2008  . MACULAR DEGENERATION 08/18/2006  . PEPTIC ULCER DISEASE, HX OF 08/18/2006  . RESTLESS LEGS SYNDROME 12/04/2009  . SLEEP APNEA 08/18/2006  . VERTIGO 07/28/2008  . WEAKNESS 06/23/2008  . CORONARY ARTERY DISEASE     non-ST segment MI, March 15, 2010  . Anemia January 2012    admitted with acute MI March 15, 2010 and has significant acute blood loss anemia requiring transfusions of two units of packed RBCs.  Status post upper endoscopy March 22, 2010 without High-Risk bleeding lesion.  History of peptic ulcer disease  . Myocardial infarction (HCC) 02/2010  . Leg pain   . AAA (abdominal aortic aneurysm) (HCC)   . NSTEMI (non-ST elevated myocardial infarction) (HCC) 2010  . PAD (peripheral artery disease) (HCC)   . PVD (peripheral vascular disease) (HCC)   . History of Doppler ultrasound 05/2011    h/o claudication, stable ABIs  . History of nuclear stress test 2007    persantine; normal, low risk     Past Surgical History  Procedure Laterality Date  . Inguinal hernia repair      Left inguinal  . Coronary artery bypass graft  12/1995    x5; LIMA to ALD, vein graft to RCA (now occluded),SVG to OM, SVG to diagonal  . Abdominal aortic aneurysm repair      stenting   . Cardiac catheterization  02/2010    r/t NSTEMI; loss of SVG to RCA  . Transthoracic echocardiogram  04/2011    EF 55-60%; mild LVH; grade 1 diastolic dysfunction  . Esophagogastroduodenoscopy N/A 01/28/2015    Procedure: ESOPHAGOGASTRODUODENOSCOPY (EGD);  Surgeon: Vida Rigger, MD;  Location[facility] ENDOSCOPY;  Service: Endoscopy;  Laterality: N/A;  . Colonoscopy with propofol N/A 02/04/2015    Procedure: COLONOSCOPY WITH  PROPOFOL;  Surgeon: Charlott Rakes, MD;  Location: [facility] ENDOSCOPY;  Service: Endoscopy;  Laterality: N/A;    FAMHx:  Family History  Problem Relation Age of Onset  . Heart disease Mother   . Heart disease Father   . Heart disease Sister   . Macular degeneration Sister   . Heart disease Sister   . Stroke Father   . Stroke Brother   . Hypertension Sister   . Heart Problems Sister     x2    SOCHx:   reports that he quit smoking about 27 years ago. His smoking use included Cigarettes. He has a 40 pack-year smoking history. He has never used smokeless tobacco. He reports that he does not drink alcohol or use illicit drugs.  ALLERGIES:  Allergies  Allergen Reactions  . Cilostazol Other (See Comments)    Adverse reaction per patient  . Doxazosin Other (See Comments)  INTOLERANCE     ROS: A comprehensive review of systems was negative except for: Constitutional: positive for fatigue, night sweats and weight loss Cardiovascular: positive for claudication Gastrointestinal: positive for melena  HOME MEDS: Current Outpatient Prescriptions  Medication Sig Dispense Refill  . acetaminophen (TYLENOL) 325 MG tablet Take 650 mg by mouth every 6 (six) hours as needed for moderate pain.     Marland Kitchen atorvastatin (LIPITOR) 20 MG tablet Take 1 tablet (20 mg total) by mouth daily. (Patient taking differently: Take 20 mg by mouth daily at 6 PM. ) 90 tablet 3  . docusate sodium (COLACE) 100 MG capsule Take 200 mg by mouth at bedtime.     . metoprolol tartrate (LOPRESSOR) 25 MG tablet Take 0.5 tablets (12.5 mg total) by mouth 2 (two) times daily. 60 tablet 1  . Multiple Vitamin (MULTIVITAMIN) tablet Take 1 tablet by mouth daily.     . nitroGLYCERIN (NITROSTAT) 0.4 MG SL tablet Place 1 tablet (0.4 mg total) under the tongue every 5 (five) minutes as needed. FOR CHEST PAIN 25 tablet 6  . pantoprazole (PROTONIX) 40 MG tablet TAKE ONE TABLET BY MOUTH ONCE DAILY (Patient taking differently: TAKE 40 MG BY  MOUTH TWICE DAILY) 60 tablet 0  . tamsulosin (FLOMAX) 0.4 MG CAPS capsule TAKE ONE CAPSULE BY MOUTH ONCE DAILY (Patient taking differently: TAKE ONE CAPSULE BY MOUTH ONCE DAILY AT BEDTIME) 30 capsule 5  . [DISCONTINUED] carbidopa-levodopa (SINEMET CR) 25-100 MG per tablet Take by mouth. 1/2 tablet at bedtime      No current facility-administered medications for this visit.    LABS/IMAGING: No results found for this or any previous visit (from the past 48 hour(s)). No results found.  VITALS: BP 158/62 mmHg  Pulse 86  Ht  (1.803 m)  Wt 208 lb (94.348 kg)  BMI 29.02 kg/m2  EXAM: deferred  EKG: deferred  ASSESSMENT: 1. Coronary artery disease status post four-vessel CABG with an occluded graft to the right coronary in 1997 2. Ischemic cardiomyopathy EF 45% 3. Abdominal aortic aneurysm status post stent grafting 4. Bilateral claudication with reduced ABIs 5. Hypertension 6. Dyslipidemia 7. Persistent atrial fibrillation-CHADSVASC 4 - not currently on anticoagulation due to GI Bleeding 8. Recurrent GI bleeding on Eliquis  PLAN: 1.    Mr. Sciandra is now in persistent atrial fibrillation. We have not pursued cardioversion due to recurrent GI bleeding on Eliquis. He is currently not on any anticoagulation. I recommend starting aspirin 81 mg daily. In the meantime I will investigate the possibility of left atrial appendage occluder device. I discussed with him the Watchman procedure today and he wants to discuss it and research it more with his sister. We'll plan a follow-up visit in February which is a ready scheduled. At that time I may have some more information about his possibility of a candidate for that procedure. He does exceed the relative contraindication of age greater than 23, but otherwise may be a good candidate for the procedure. He understands he would have to have a TEE prior to the procedure as part of the workup.   Chrystie Nose, MD, [facility] Attending  Cardiologist [facility]  Chrystie Nose 02/09/2015, 5:34 PM

## 2015-02-10 ENCOUNTER — Ambulatory Visit: Payer: Medicare Other | Admitting: Adult Health

## 2015-02-10 ENCOUNTER — Ambulatory Visit (INDEPENDENT_AMBULATORY_CARE_PROVIDER_SITE_OTHER): Payer: Medicare Other | Admitting: Adult Health

## 2015-02-10 ENCOUNTER — Encounter: Payer: Self-pay | Admitting: Adult Health

## 2015-02-10 VITALS — BP 102/62 | Temp 98.1°F | Ht 71.0 in | Wt 208.6 lb

## 2015-02-10 DIAGNOSIS — Z09 Encounter for follow-up examination after completed treatment for conditions other than malignant neoplasm: Secondary | ICD-10-CM

## 2015-02-10 DIAGNOSIS — Z8719 Personal history of other diseases of the digestive system: Secondary | ICD-10-CM | POA: Diagnosis not present

## 2015-02-10 LAB — CBC WITH DIFFERENTIAL/PLATELET
BASOS ABS: 0 10*3/uL (ref 0.0–0.1)
Basophils Relative: 0.6 % (ref 0.0–3.0)
EOS ABS: 0.2 10*3/uL (ref 0.0–0.7)
Eosinophils Relative: 2.2 % (ref 0.0–5.0)
HCT: 26.4 % — ABNORMAL LOW (ref 39.0–52.0)
Hemoglobin: 8.4 g/dL — ABNORMAL LOW (ref 13.0–17.0)
LYMPHS ABS: 0.9 10*3/uL (ref 0.7–4.0)
Lymphocytes Relative: 12.2 % (ref 12.0–46.0)
MCHC: 31.6 g/dL (ref 30.0–36.0)
MCV: 81.1 fl (ref 78.0–100.0)
Monocytes Absolute: 0.9 10*3/uL (ref 0.1–1.0)
Monocytes Relative: 11.4 % (ref 3.0–12.0)
NEUTROS ABS: 5.6 10*3/uL (ref 1.4–7.7)
NEUTROS PCT: 73.6 % (ref 43.0–77.0)
PLATELETS: 288 10*3/uL (ref 150.0–400.0)
RBC: 3.26 Mil/uL — ABNORMAL LOW (ref 4.22–5.81)
RDW: 18.3 % — ABNORMAL HIGH (ref 11.5–15.5)
WBC: 7.6 10*3/uL (ref 4.0–10.5)

## 2015-02-10 NOTE — Progress Notes (Addendum)
   Subjective:    Patient ID: Agustina CaroliCarl Basaldua, male    DOB: [birth year], 81 y.o.   MRN: [id number]  HPI  81 year old male, patient of Dr. Kirtland BouchardK. He presents to the office today for hospital follow up regarding GI bleed. This instance happened on 02/02/2015 and he was discharged on 02/06/2015.   In the ER he required 2 units of PRBC, this was after increasing his Eliquis from 2.5 mg to 5 mg as it was determined that he was under dosed for a cardioversion related to A-fib.While taking the 5 mg of Eliquis he continued to take 325 of ASA.   During his hospitalization all anticoagulation was discontinued i.e. Eliquis and aspirin, and a colonoscopy performed which showed no definitive site of bleeding. It did show some diverticulosis and internal hemorrhoids.   Review of Systems Review of Systems  Constitutional: Positive for fatigue (improving).  HENT: Negative.   Eyes: Negative.   Respiratory: Negative.   Cardiovascular: Negative.   Musculoskeletal: Negative.   Skin: Negative.   Neurological: Negative.   Hematological: Negative.   Psychiatric/Behavioral: Negative.   All other systems reviewed and are negative.     Objective:   Physical Exam  Physical Exam  Constitutional: He is oriented to person, place, and time. He appears well-developed and well-nourished. No distress.  HENT:  Head: Normocephalic and atraumatic.  Right Ear: External ear normal.  Left Ear: External ear normal.  Nose: Nose normal.  Mouth/Throat: Oropharynx is clear and moist. No oropharyngeal exudate.  Neck: Normal range of motion. Neck supple.  Cardiovascular: Normal heart sounds and intact distal pulses.  Exam reveals no gallop and no friction rub.   No murmur heard. A fib  Pulmonary/Chest: Effort normal and breath sounds normal. No respiratory distress. He has no wheezes. He has no rales. He exhibits no tenderness.  Abdominal: Soft. Bowel sounds are normal. He exhibits no distension and no mass. There is no  tenderness. There is no rebound and no guarding.  Genitourinary: Rectum normal, prostate normal and penis normal. Guaiac negative stool. No penile tenderness.  Musculoskeletal: Normal range of motion. He exhibits no edema.  Lymphadenopathy:    He has no cervical adenopathy.  Neurological: He is alert and oriented to person, place, and time.  Skin: Skin is warm and dry. No rash noted. He is not diaphoretic. No erythema. No pallor.  Psychiatric: He has a normal mood and affect. His behavior is normal. Judgment and thought content normal.  Nursing note and vitals reviewed.       Assessment & Plan:  1. Hospital discharge follow-up - CBC with Differential/Platelet - Iron, TIBC and Ferritin Panel - Advised PO iron supplment - Follow up with PCP as needed - Return to the ER with any additional blood in stool

## 2015-02-10 NOTE — Progress Notes (Signed)
Pre visit review using our clinic review tool, if applicable. No additional management support is needed unless otherwise documented below in the visit note. 

## 2015-02-10 NOTE — Patient Instructions (Signed)
It was great meeting you today and I am glad you are back to feeling normal.   I will follow up with you regarding your lab work.   Follow up in the ER with any additional bleeding.

## 2015-02-11 ENCOUNTER — Telehealth: Payer: Self-pay | Admitting: Pulmonary Disease

## 2015-02-11 LAB — IRON,TIBC AND FERRITIN PANEL
%SAT: 5 % — AB (ref 15–60)
Ferritin: 31 ng/mL (ref 22–322)
IRON: 16 ug/dL — AB (ref 50–180)
TIBC: 322 ug/dL (ref 250–425)

## 2015-02-11 NOTE — Telephone Encounter (Signed)
Spoke with pt's son. Advised him that [facility] and [facility] per documentation on the order. [facility]'s number has been given to him to call them. Nothing further was needed.

## 2015-02-17 ENCOUNTER — Telehealth: Payer: Self-pay | Admitting: Internal Medicine

## 2015-02-17 ENCOUNTER — Other Ambulatory Visit: Payer: Self-pay | Admitting: Adult Health

## 2015-02-17 DIAGNOSIS — D509 Iron deficiency anemia, unspecified: Secondary | ICD-10-CM

## 2015-02-17 NOTE — Telephone Encounter (Signed)
Corey Mcdonald, the pt's son wants Corey Mcdonald to have labs drawn for his Hemoglobin and iron and a stool test. He said the pt had an internal bleed for some time and he wants his levels checked. He'd like a phone call regarding this as far as when Corey Mcdonald can be placed on the schedule. I didn't see orders listed for the pt so I didn't know when or if it was ok to schedule. Please call Corey Mcdonald.  Corey Mcdonald's ph# [phone] Thank you.

## 2015-02-17 NOTE — Telephone Encounter (Signed)
Spoke to pt's son Jeanmarie HubertCarlan, is concerned about pt and would like labs rechecked to make sure pt is going in the right direction before [place] returns to [place]. Please advise.

## 2015-02-17 NOTE — Telephone Encounter (Signed)
Will place orders for labs. We can send him a occult blood test to do at home if needed. He should be advised that I can not guarantee that insurance will cover it, since it was just done. Also, it is very unlikely his levels would go up much in the last 7 days.

## 2015-02-18 ENCOUNTER — Encounter (HOSPITAL_COMMUNITY): Payer: Self-pay

## 2015-02-18 ENCOUNTER — Ambulatory Visit (HOSPITAL_COMMUNITY): Admit: 2015-02-18 | Payer: Self-pay | Admitting: Internal Medicine

## 2015-02-18 SURGERY — CARDIOVERSION
Anesthesia: Monitor Anesthesia Care

## 2015-02-18 NOTE — Telephone Encounter (Signed)
Spoke to pt's son Corey Mcdonald, told him [name] said that he will place orders for labs and can send him a occult blood test to do at home if needed. Just to  let you know we can not guarantee that insurance will cover it, since it was just done. Also, it is very unlikely his levels would go up much in the last 7 days. [name] said he is very upset and disgusted that a simple thing is such a big deal and when a pt calls and says they want something done it should be done. Tried to explain to him but just said I am talking and said thanks for nothing and hung up. Mailed cards to pt to check for occult blood.

## 2015-02-26 ENCOUNTER — Other Ambulatory Visit: Payer: Self-pay | Admitting: Internal Medicine

## 2015-02-27 NOTE — Telephone Encounter (Signed)
Rx(s) sent to pharmacy electronically.  

## 2015-03-18 ENCOUNTER — Other Ambulatory Visit: Payer: Self-pay | Admitting: Family Medicine

## 2015-03-18 ENCOUNTER — Other Ambulatory Visit: Payer: Self-pay | Admitting: Internal Medicine

## 2015-03-19 MED ORDER — ATORVASTATIN CALCIUM 20 MG PO TABS: 20.0000 mg | ORAL_TABLET | Freq: Every day | ORAL | Status: DC

## 2015-03-20 ENCOUNTER — Ambulatory Visit: Payer: Medicare Other | Admitting: Internal Medicine

## 2015-03-27 ENCOUNTER — Ambulatory Visit: Payer: Medicare Other | Admitting: Internal Medicine

## 2015-03-27 ENCOUNTER — Institutional Professional Consult (permissible substitution): Payer: Medicare Other | Admitting: Internal Medicine

## 2015-04-01 ENCOUNTER — Ambulatory Visit: Payer: Medicare Other | Admitting: Physician Assistant

## 2015-04-03 ENCOUNTER — Ambulatory Visit (INDEPENDENT_AMBULATORY_CARE_PROVIDER_SITE_OTHER): Payer: Medicare Other | Admitting: Internal Medicine

## 2015-04-03 ENCOUNTER — Encounter: Payer: Self-pay | Admitting: Internal Medicine

## 2015-04-03 VITALS — BP 124/60 | HR 81 | Ht 71.0 in | Wt 210.0 lb

## 2015-04-03 DIAGNOSIS — I481 Persistent atrial fibrillation: Secondary | ICD-10-CM

## 2015-04-03 DIAGNOSIS — Z951 Presence of aortocoronary bypass graft: Secondary | ICD-10-CM

## 2015-04-03 DIAGNOSIS — I482 Chronic atrial fibrillation, unspecified: Secondary | ICD-10-CM

## 2015-04-03 DIAGNOSIS — I4819 Other persistent atrial fibrillation: Secondary | ICD-10-CM

## 2015-04-03 DIAGNOSIS — I10 Essential (primary) hypertension: Secondary | ICD-10-CM | POA: Diagnosis not present

## 2015-04-03 DIAGNOSIS — K922 Gastrointestinal hemorrhage, unspecified: Secondary | ICD-10-CM

## 2015-04-03 NOTE — Progress Notes (Signed)
OFFICE NOTE  Chief Complaint:  Follow-up to discuss anticoagulation.  Primary Care Physician: Rogelia Boga, MD  HPI:  Corey Mcdonald is an 82 year old gentleman with history of coronary bypass in 1997 and had a non-ST-elevation MI in 2010 which was due to the loss of vein graft to the right coronary. EF was 45%. The rest of his anatomy includes a LIMA to the LAD, a saphenous vein graft to an OM and a saphenous vein graft to a diagonal. The saphenous vein graft to the RCA was occluded in 2013. He also has hypertension which has been well controlled and dyslipidemia on Crestor. In addition, he had an abdominal aortic aneurysm and underwent stent grafting. He also has lower extremity claudication with stable ABIs in April 2013 with 0.77 on the left and 0.92 on the right, otherwise no other symptoms. He is being followed by Dr. early for this, who placed his stent graft. He's recently seen by him in June of this year and felt that his claudication is not lifestyle limiting and did not recommend any further treatment at this time. He does have a history of PAF but no recurrence recently but he is not on Coumadin due to high risk of falls and he has significant visual impairment.   Corey Mcdonald returns today for followup. He reports that he has been doing fairly well. He is having some difficulty with his wife who I guess is aching and may be declining. He's recently had problems with weight loss. He says that his appetite is good and he eats about the same but has lost over 30 pounds. His heart rate is noted to be slower today in blood pressure is borderline low. He also been complaining of night sweats recently.  I the pleasure see Corey Mcdonald back in the office today. He is overall doing fairly well although does report some progressive fatigue. He seems to have noted this over the past several years, not necessarily recently. He does have a history of paroxysmal atrial fibrillation in  the distant past but has been in sinus rhythm most of the time when I see him in the office. Today however he is in atrial fibrillation. He's been maintained on aspirin apparently due to visual impairment and high falls and was previously on warfarin. I had a discussion today in the office with his daughter who says that he is very compliant with his medicines as she arranges them for him and I did not feel that visual impairment would be something that she keep him from being on anticoagulation. He also is not having any falls. He in fact walks about a mile to mile and half every day.  Corey Mcdonald returns today for follow-up.  He remains in persistent atrial fibrillation. He was scheduled to have a cardioversion however it was correctly noted that he was under dosed and on 2.5 mg twice a day of Eliquis,  However should've been on the 5 mg twice daily dosing. Fortunately he's had no problems with this and was correctly switched to the 5 mg twice daily dose. This was as of November 11. We therefore will have to postpone his cardioversion until after December 11.  Corey Mcdonald returns for follow-up. Unfortunately he's been hospitalized twice for GI bleeding. The last occurrence required transfusion of 2 units packed red blood cells. This is after increasing his Eliquis from 2.5-5 mg as it was determined he was possibly under dosed. We have not yet been able to undergo cardioversion  and I'm a little leery about pursuing that now because of his ongoing bleeding. Is not clear that he could be chronically anticoagulated. Currently he is on no blood thinners. His bleeding has stopped. We discussed several options in the office today including aspirin only therapy and also considering the possibility of a Watchman left atrial appendage occluder device. Would require a short period of anticoagulation to have this placed, however it could pay dividends as far as reduction in stroke risk over the long-term.   Corey Mcdonald  returns today for follow-up. He's here today to discuss anticoagulation with his daughter. Despite being on warfarin and Eliquis at varying doses he's had 2 episodes of significant GI bleeding. He was on aspirin as well.  His  CHADSVASC score is 4. I'm hesitant to put him back on anything other than aspirin at this point.  We did discuss the possibility of the watchman device.  PMHx:  Past Medical History  Diagnosis Date  . ABDOMINAL AORTIC ANEURYSM 09/15/2008  . Atrial fibrillation (HCC) 10/10/2006  . Atrial flutter (HCC) 08/18/2006  . BENIGN PROSTATIC HYPERTROPHY, HX OF 08/18/2006  . COLONIC POLYPS, HX OF 08/18/2006  . HYPERLIPIDEMIA 12/04/2009  . HYPERTENSION 08/18/2006  . HYPOTENSION 09/04/2008  . MACULAR DEGENERATION 08/18/2006  . PEPTIC ULCER DISEASE, HX OF 08/18/2006  . RESTLESS LEGS SYNDROME 12/04/2009  . SLEEP APNEA 08/18/2006  . VERTIGO 07/28/2008  . WEAKNESS 06/23/2008  . CORONARY ARTERY DISEASE     non-ST segment MI, March 15, 2010  . Anemia January 2012    admitted with acute MI March 15, 2010 and has significant acute blood loss anemia requiring transfusions of two units of packed RBCs.  Status post upper endoscopy March 22, 2010 without High-Risk bleeding lesion.  History of peptic ulcer disease  . Myocardial infarction (HCC) 02/2010  . Leg pain   . AAA (abdominal aortic aneurysm) (HCC)   . NSTEMI (non-ST elevated myocardial infarction) (HCC) 2010  . PAD (peripheral artery disease) (HCC)   . PVD (peripheral vascular disease) (HCC)   . History of Doppler ultrasound 05/2011    h/o claudication, stable ABIs  . History of nuclear stress test 2007    persantine; normal, low risk     Past Surgical History  Procedure Laterality Date  . Inguinal hernia repair      Left inguinal  . Coronary artery bypass graft  12/1995    x5; LIMA to ALD, vein graft to RCA (now occluded),SVG to OM, SVG to diagonal  . Abdominal aortic aneurysm repair      stenting   . Cardiac catheterization   02/2010    r/t NSTEMI; loss of SVG to RCA  . Transthoracic echocardiogram  04/2011    EF 55-60%; mild LVH; grade 1 diastolic dysfunction  . Esophagogastroduodenoscopy N/A 01/28/2015    Procedure: ESOPHAGOGASTRODUODENOSCOPY (EGD);  Surgeon: Vida Rigger, MD;  Location[facility] ENDOSCOPY;  Service: Endoscopy;  Laterality: N/A;  . Colonoscopy with propofol N/A 02/04/2015    Procedure: COLONOSCOPY WITH PROPOFOL;  Surgeon: Charlott Rakes, MD;  Location: [facility] ENDOSCOPY;  Service: Endoscopy;  Laterality: N/A;    FAMHx:  Family History  Problem Relation Age of Onset  . Heart disease Mother   . Heart disease Father   . Heart disease Sister   . Macular degeneration Sister   . Heart disease Sister   . Stroke Father   . Stroke Brother   . Hypertension Sister   . Heart Problems Sister     x2  SOCHx:   reports that he quit smoking about 28 years ago. His smoking use included Cigarettes. He has a 40 pack-year smoking history. He has never used smokeless tobacco. He reports that he does not drink alcohol or use illicit drugs.  ALLERGIES:  Allergies  Allergen Reactions  . Cilostazol Other (See Comments)    Adverse reaction per patient  . Doxazosin Other (See Comments)    INTOLERANCE     ROS: Pertinent items noted in HPI and remainder of comprehensive ROS otherwise negative.  HOME MEDS: Current Outpatient Prescriptions  Medication Sig Dispense Refill  . acetaminophen (TYLENOL) 325 MG tablet Take 650 mg by mouth every 6 (six) hours as needed for moderate pain.     Marland Kitchen aspirin 81 MG tablet Take 81 mg by mouth daily.    Marland Kitchen atorvastatin (LIPITOR) 20 MG tablet Take 1 tablet (20 mg total) by mouth daily. 90 tablet 1  . docusate sodium (COLACE) 100 MG capsule Take 200 mg by mouth at bedtime.     . metoprolol tartrate (LOPRESSOR) 25 MG tablet Take 0.5 tablets (12.5 mg total) by mouth 2 (two) times daily. 60 tablet 1  . Multiple Vitamin (MULTIVITAMIN) tablet Take 1 tablet by mouth daily.     .  nitroGLYCERIN (NITROSTAT) 0.4 MG SL tablet Place 1 tablet (0.4 mg total) under the tongue every 5 (five) minutes as needed. FOR CHEST PAIN 25 tablet 6  . pantoprazole (PROTONIX) 40 MG tablet TAKE ONE TABLET BY MOUTH ONCE DAILY. 60 tablet 1  . tamsulosin (FLOMAX) 0.4 MG CAPS capsule TAKE ONE CAPSULE BY MOUTH ONCE DAILY (Patient taking differently: TAKE ONE CAPSULE BY MOUTH ONCE DAILY AT BEDTIME) 30 capsule 5  . [DISCONTINUED] carbidopa-levodopa (SINEMET CR) 25-100 MG per tablet Take by mouth. 1/2 tablet at bedtime      No current facility-administered medications for this visit.    LABS/IMAGING: No results found for this or any previous visit (from the past 48 hour(s)). No results found.  VITALS: BP 124/60 mmHg  Pulse 81  Ht  (1.803 m)  Wt 210 lb (95.255 kg)  BMI 29.30 kg/m2  EXAM: deferred  EKG: deferred  ASSESSMENT: 1. Coronary artery disease status post four-vessel CABG with an occluded graft to the right coronary in 1997 2. Ischemic cardiomyopathy EF 45% 3. Abdominal aortic aneurysm status post stent grafting 4. Bilateral claudication with reduced ABIs 5. Hypertension 6. Dyslipidemia 7. Persistent atrial fibrillation-CHADSVASC 4 - not currently on anticoagulation due to GI Bleeding 8. Recurrent GI bleeding on Eliquis  PLAN: 1.    Corey Mcdonald is now in persistent atrial fibrillation. We have not pursued cardioversion due to recurrent GI bleeding on Eliquis. He is currently not on any anticoagulation. I recommend starting aspirin 81 mg daily. I  Spoke with the patient and his family about the possibility of the watchman left atrial appendage occluder device. This may obviate the need for anticoagulation given his recurrent GI bleeding and the fact that he has an elevated stroke risk. Although he's 89, he is quite viable and very active and walks regularly. I do think that he would be able to tolerate either low-dose Eliquis or warfarin for a period of time with aspirin and  dual antiplatelet therapy for a brief period time to allow placement of a watchman device. I would like him  And his daughter to speak with Dr. Johney Frame further about the procedure.   I have seen Corey Mcdonald is a 82 y.o. male in the office today  who is being considered for a Watchman left atrial appendage closure device.  He has a history of chronic atrial fibrillation.  This patients CHA2DS2-VASc Score and unadjusted Ischemic Stroke Rate (% per year) is equal to 4.8 % stroke rate/year from a score of 4 which necessitates long term oral anticoagulation to prevent stroke.  Unfortunately, He is not felt to be a long term Warfarin candidate secondary to recurrent GI bleeding and intolerance to Eliquis.  The patients chart has been reviewed and I feel that they would be a candidate for short term oral anticoagulation.  Procedural risks for the Watchman implant have been reviewed with the patient including a 1% risk of stroke, 2% risk of perforation, 0.1% risk of device embolization.  Given the patient's poor candidacy for long-term oral anticoagulation, ability to tolerate short term oral anticoagulation I have recommended the watchman left atrial appendage closure system.  Chrystie Nose, MD, [facility] Attending Cardiologist [facility]  Chrystie Nose 04/03/2015, 5:36 PM

## 2015-04-03 NOTE — Patient Instructions (Addendum)
You have been referred to Dr. Johney Frame for a watchman device/procedure His office is located at [address]  Your physician recommends that you schedule a follow-up appointment in: 3 months with Dr. Rennis Golden

## 2015-04-10 ENCOUNTER — Encounter: Payer: Self-pay | Admitting: Internal Medicine

## 2015-04-10 ENCOUNTER — Ambulatory Visit (INDEPENDENT_AMBULATORY_CARE_PROVIDER_SITE_OTHER): Payer: Medicare Other | Admitting: Internal Medicine

## 2015-04-10 VITALS — BP 138/72 | HR 89 | Ht 71.0 in | Wt 209.0 lb

## 2015-04-10 DIAGNOSIS — I482 Chronic atrial fibrillation, unspecified: Secondary | ICD-10-CM

## 2015-04-10 DIAGNOSIS — I10 Essential (primary) hypertension: Secondary | ICD-10-CM

## 2015-04-10 LAB — CBC WITH DIFFERENTIAL/PLATELET
BASOS ABS: 0 10*3/uL (ref 0.0–0.1)
BASOS PCT: 0 % (ref 0–1)
EOS PCT: 2 % (ref 0–5)
Eosinophils Absolute: 0.2 10*3/uL (ref 0.0–0.7)
HEMATOCRIT: 38.3 % — AB (ref 39.0–52.0)
Hemoglobin: 12.6 g/dL — ABNORMAL LOW (ref 13.0–17.0)
LYMPHS PCT: 14 % (ref 12–46)
Lymphs Abs: 1.3 10*3/uL (ref 0.7–4.0)
MCH: 28.7 pg (ref 26.0–34.0)
MCHC: 32.9 g/dL (ref 30.0–36.0)
MCV: 87.2 fL (ref 78.0–100.0)
MPV: 9.5 fL (ref 8.6–12.4)
Monocytes Absolute: 1.1 10*3/uL — ABNORMAL HIGH (ref 0.1–1.0)
Monocytes Relative: 12 % (ref 3–12)
NEUTROS ABS: 6.6 10*3/uL (ref 1.7–7.7)
Neutrophils Relative %: 72 % (ref 43–77)
PLATELETS: 256 10*3/uL (ref 150–400)
RBC: 4.39 MIL/uL (ref 4.22–5.81)
RDW: 17 % — AB (ref 11.5–15.5)
WBC: 9.1 10*3/uL (ref 4.0–10.5)

## 2015-04-10 LAB — BASIC METABOLIC PANEL
BUN: 23 mg/dL (ref 7–25)
CO2: 26 mmol/L (ref 20–31)
Calcium: 9.4 mg/dL (ref 8.6–10.3)
Chloride: 102 mmol/L (ref 98–110)
Creat: 1.35 mg/dL — ABNORMAL HIGH (ref 0.70–1.11)
Glucose, Bld: 78 mg/dL (ref 65–99)
POTASSIUM: 4.2 mmol/L (ref 3.5–5.3)
SODIUM: 137 mmol/L (ref 135–146)

## 2015-04-10 MED ORDER — APIXABAN 5 MG PO TABS: 5.0000 mg | ORAL_TABLET | Freq: Two times a day (BID) | ORAL | Status: DC

## 2015-04-10 NOTE — Patient Instructions (Signed)
Medication Instructions:  Your physician has recommended you make the following change in your medication:  1) Stop Aspirin 2) Start Eliquis  twice daily   Labwork: Your physician recommends that you return for lab work today: BMP/CBC     Testing/Procedures: None ordered   Follow-Up: Your physician recommends that you schedule a follow-up appointment with Dr Rennis Golden as scheduled and as needed with Dr Johney Frame  Needs an appointment to the CVRR clinic in 4 weeks for restart of Eliquis    Any Other Special Instructions Will Be Listed Below (If Applicable).     If you need a refill on your cardiac medications before your next appointment, please call your pharmacy.

## 2015-04-10 NOTE — Progress Notes (Signed)
Watchman Consult Note   Date:  04/10/2015   ID:  Corey Mcdonald, DOB [birth year], MRN [id number]  PCP:  Corey Boga, MD  Cardiologist:  Rennis Golden Referring Physician: Rennis Golden   CC: to discuss Watchman implant    History of Present Illness: Corey Mcdonald is a 82 y.o. male who presents today for evaluation of left atrial appendage occluder.  He has persistent atrial fibrillation as well as CAD s/p CABG, PVD, hypertension and hyperlipidemia.  The patient has been evaluated by their referring physician and is felt to be a poor candidate for long term OAC due to GI bleeding.  He therefore presents today for Watchman evaluation.   Echo 04/2011 demonstrated EF 55-60%, moderate hypokinesis of the basal-midinferolateral and inferior myocardium, grade 1 diastolic dysfunction, LA 32.   He is fairly sedentary at home, does not leave the house 2/2 legal blindness.  He walks 1/2 mile twice a day inside at home.  With walking, he is "pooped" at the end.  He reports medication compliance and compliance with CPAP.   Today, he denies symptoms of palpitations, chest pain, shortness of breath, orthopnea, PND, lower extremity edema, claudication, dizziness, presyncope, syncope, bleeding, or neurologic sequela. The patient is tolerating medications without difficulties and is otherwise without complaint today.    Past Medical History  Diagnosis Date  . ABDOMINAL AORTIC ANEURYSM   . Atrial fibrillation (HCC)   . BENIGN PROSTATIC HYPERTROPHY, HX OF    . COLONIC POLYPS, HX OF   . HYPERLIPIDEMIA   . HYPERTENSION   . MACULAR DEGENERATION   . PEPTIC ULCER DISEASE, HX OF   . RESTLESS LEGS SYNDROME   . SLEEP APNEA   . VERTIGO   . CORONARY ARTERY DISEASE     non-ST segment MI, March 15, 2010  . Anemia January 2012    admitted with acute MI March 15, 2010 and has significant acute blood loss anemia requiring transfusions of two units of packed RBCs.  Status post upper endoscopy March 22, 2010 without  High-Risk bleeding lesion.  History of peptic ulcer disease  . Myocardial infarction (HCC) 02/2010  . AAA (abdominal aortic aneurysm) (HCC)   . PAD (peripheral artery disease) (HCC)   . PVD (peripheral vascular disease) (HCC)   . History of Doppler ultrasound 05/2011    h/o claudication, stable ABIs  . History of nuclear stress test 2007    persantine; normal, low risk    Past Surgical History  Procedure Laterality Date  . Inguinal hernia repair      Left inguinal  . Coronary artery bypass graft  12/1995    x5; LIMA to ALD, vein graft to RCA (now occluded),SVG to OM, SVG to diagonal  . Abdominal aortic aneurysm repair      stenting   . Cardiac catheterization  02/2010    r/t NSTEMI; loss of SVG to RCA  . Transthoracic echocardiogram  04/2011    EF 55-60%; mild LVH; grade 1 diastolic dysfunction  . Esophagogastroduodenoscopy N/A 01/28/2015    Procedure: ESOPHAGOGASTRODUODENOSCOPY (EGD);  Surgeon: Vida Rigger, MD;  Location: [facility] ENDOSCOPY;  Service: Endoscopy;  Laterality: N/A;  . Colonoscopy with propofol N/A 02/04/2015    Procedure: COLONOSCOPY WITH PROPOFOL;  Surgeon: Charlott Rakes, MD;  Location[facility] ENDOSCOPY;  Service: Endoscopy;  Laterality: N/A;     Current Outpatient Prescriptions  Medication Sig Dispense Refill  . acetaminophen (TYLENOL) 325 MG tablet Take 650 mg by mouth every 6 (six) hours as needed for moderate pain.     Marland Kitchen  atorvastatin (LIPITOR) 20 MG tablet Take 1 tablet (20 mg total) by mouth daily. 90 tablet 1  . docusate sodium (COLACE) 100 MG capsule Take 100 mg by mouth at bedtime.     . ferrous sulfate 325 (65 FE) MG tablet Take 325 mg by mouth daily.    . metoprolol tartrate (LOPRESSOR) 25 MG tablet Take 25 mg by mouth 2 (two) times daily.    . Multiple Vitamin (MULTIVITAMIN) tablet Take 1 tablet by mouth daily.     . nitroGLYCERIN (NITROSTAT) 0.4 MG SL tablet Place 1 tablet (0.4 mg total) under the tongue every 5 (five) minutes as needed. FOR CHEST PAIN 25 tablet 6   . pantoprazole (PROTONIX) 40 MG tablet TAKE ONE TABLET BY MOUTH ONCE DAILY. 60 tablet 1  . tamsulosin (FLOMAX) 0.4 MG CAPS capsule Take 0.4 mg by mouth every evening.    Marland Kitchen apixaban (ELIQUIS) 5 MG TABS tablet Take 1 tablet (5 mg total) by mouth 2 (two) times daily. 60 tablet 6  . [DISCONTINUED] carbidopa-levodopa (SINEMET CR) 25-100 MG per tablet Take by mouth. 1/2 tablet at bedtime      No current facility-administered medications for this visit.    Allergies:   Cilostazol and Doxazosin   Social History:  The patient  reports that he quit smoking about 28 years ago. His smoking use included Cigarettes. He has a 40 pack-year smoking history. He has never used smokeless tobacco. He reports that he does not drink alcohol or use illicit drugs.   Family History:  The patient's family history includes Heart Problems in his sister; Heart disease in his father, mother, sister, and sister; Hypertension in his sister; Macular degeneration in his sister; Stroke in his brother and father.    ROS:  Please see the history of present illness.   All other systems are reviewed and negative.    PHYSICAL EXAM: VS:  BP 138/72 mmHg  Pulse 89  Ht  (1.803 m)  Wt 209 lb (94.802 kg)  BMI 29.16 kg/m2 , BMI Body mass index is 29.16 kg/(m^2). GEN: Elderly, well nourished, well developed, in no acute distress HEENT: normal Neck: no JVD, carotid bruits, or masses Cardiac: IRRR; no murmurs, rubs, or gallops,no edema  Respiratory:  clear to auscultation bilaterally, normal work of breathing GI: soft, nontender, nondistended, + BS MS: no deformity or atrophy Skin: warm and dry  Neuro:  Strength and sensation are intact Psych: euthymic mood, full affect  EKG:  EKG is not ordered today.  Recent Labs: 12/26/2014: TSH 2.150 01/27/2015: B Natriuretic Peptide 272.0* 02/05/2015: ALT 17; BUN 15; Creatinine, Ser 1.33*; Potassium 3.6; Sodium 138 02/10/2015: Hemoglobin 8.4 Repeated and verified X2.*; Platelets  288.0     Wt Readings from Last 3 Encounters:  04/10/15 209 lb (94.802 kg)  04/03/15 210 lb (95.255 kg)  02/10/15 208 lb 9.6 oz (94.62 kg)      Other studies Reviewed: Additional studies/ records that were reviewed today include: Dr [facility] office notes  ASSESSMENT AND PLAN:  1.  Persistent atrial fibrillation I have seen Deanna Boehlke is a 82 y.o. male in the office today who has been referred by Dr Rennis Golden for a Watchman left atrial appendage closure device.  He has a history of persistent atrial fibrillation.  He has had prior GI bleeding on Eliquis and ASA.  Prior to Eliquis, he was on Coumadin and tolerated this well for many years.  Today, we discussed watchman as an option for stroke prevention.  Given his  advanced age, he is at increased risks with procedures.  He is clear that he would like to avoid this procedure if possible. Today, I discussed Coumadin as well as novel anticoagulants including Eliquis today as indicated for risk reduction in stroke and systemic emboli with nonvalvular atrial fibrillation.  Risks, benefits, and alternatives to each of these drugs were discussed at length today. As he was on asa and eliquis previously, it may be that he would better tolerate eliquis without asa.  I also suggested that he had done well on coumadin and could restart this medicine.  After long conversation, the patient and his daughter are clear that he would prefer to try eliquis 5mg  BID and stop asa.  Check cbc and bmet today to insure appropriate dosing. Follow-up in anticoagulation clinic in 4 weeks.   2.  HTN Stable No change required today   3.  CAD s/p CABG No recent ischemic symptoms Continue current therapy   Follow-up:  With Dr Rennis Golden as scheduled I will see as needed going forward    Signed, Hillis Range, MD, [facility] 04/10/2015 11:45 AM  [facility] [address] [place] [address] [phone] (office) [phone] (fax)

## 2015-04-24 ENCOUNTER — Telehealth: Payer: Self-pay | Admitting: Internal Medicine

## 2015-04-24 NOTE — Telephone Encounter (Signed)
1. Type of surgery: tooth extraction with local anesthesia 2. Date of surgery: not noted 3. Surgeon: Sharalyn Ink. Jason Mohorn, DDS 4. Medications that need to be held & how long: Eliquis - hold to long to, when to resume  5. Fax and/or Phone: (p) [phone]  (f) [phone]  Message routed to MD

## 2015-04-26 NOTE — Telephone Encounter (Signed)
Hold Eliquis for 2 days prior to dental extraction and restart 24 hours after.  Dr. HRexene Edison

## 2015-04-27 NOTE — Telephone Encounter (Signed)
Clearance note faxed

## 2015-05-08 ENCOUNTER — Ambulatory Visit (INDEPENDENT_AMBULATORY_CARE_PROVIDER_SITE_OTHER): Payer: Medicare Other | Admitting: *Deleted

## 2015-05-08 DIAGNOSIS — I4891 Unspecified atrial fibrillation: Secondary | ICD-10-CM

## 2015-05-08 LAB — CBC
HCT: 39.2 % (ref 39.0–52.0)
HEMOGLOBIN: 12.7 g/dL — AB (ref 13.0–17.0)
MCH: 28.5 pg (ref 26.0–34.0)
MCHC: 32.4 g/dL (ref 30.0–36.0)
MCV: 88.1 fL (ref 78.0–100.0)
MPV: 9.5 fL (ref 8.6–12.4)
Platelets: 228 10*3/uL (ref 150–400)
RBC: 4.45 MIL/uL (ref 4.22–5.81)
RDW: 15.8 % — ABNORMAL HIGH (ref 11.5–15.5)
WBC: 8 10*3/uL (ref 4.0–10.5)

## 2015-05-08 LAB — BASIC METABOLIC PANEL
BUN: 23 mg/dL (ref 7–25)
CALCIUM: 9.3 mg/dL (ref 8.6–10.3)
CO2: 26 mmol/L (ref 20–31)
CREATININE: 1.27 mg/dL — AB (ref 0.70–1.11)
Chloride: 104 mmol/L (ref 98–110)
GLUCOSE: 101 mg/dL — AB (ref 65–99)
Potassium: 4.3 mmol/L (ref 3.5–5.3)
Sodium: 140 mmol/L (ref 135–146)

## 2015-05-08 NOTE — Progress Notes (Signed)
Pt was started on Eliquis 5mg  twice a day for Afib on 04/05/15 by Dr. Rennis GoldenHilty.    Reviewed patients medication list.  Pt is not currently on any combined P-gp and strong CYP3A4 inhibitors/inducers (ketoconazole, traconazole, ritonavir, carbamazepine, phenytoin, rifampin, St. John's wort).  Reviewed labs.  SCr-1.27, EXBMWU-13.2GMWeight-92.9kg, Hgb-12.7, HCt-39.2.  Dose appropriate based on dosing criteria.   Hgb and HCT Within Normal Limits  A full discussion of the nature of anticoagulants has been carried out.  A benefit/risk analysis has been presented to the patient, so that they understand the justification for choosing anticoagulation with Eliquis at this time.  The need for compliance is stressed.  Pt is aware to take the medication twice daily.  Side effects of potential bleeding are discussed, including unusual colored urine or stools, coughing up blood or coffee ground emesis, nose bleeds or serious fall or head trauma.  Discussed signs and symptoms of stroke. The patient should avoid any OTC items containing aspirin or ibuprofen.  Avoid alcohol consumption.   Call if any signs of abnormal bleeding.  Discussed financial obligations and resolved any difficulty in obtaining medication.  Next lab test test in 6 months. Patient will call & make an appointment in September with Bel[facility]rist[facility].    Having a tooth extraction today & has been holding Eliquis for 2 days for tooth extraction, he will restart his Eliquis tonight as instructed by Dr. Rennis GoldenHilty.  Spoke with Amy-dtr & discussed lab results and instructed to continue taking Eliquis 5mg  twice a day.

## 2015-05-11 ENCOUNTER — Encounter: Payer: Self-pay | Admitting: Internal Medicine

## 2015-06-15 ENCOUNTER — Other Ambulatory Visit: Payer: Self-pay | Admitting: Internal Medicine

## 2015-06-16 NOTE — Telephone Encounter (Signed)
Rx(s) sent to pharmacy electronically.  

## 2015-06-19 ENCOUNTER — Institutional Professional Consult (permissible substitution): Payer: Medicare Other | Admitting: Internal Medicine

## 2015-06-26 ENCOUNTER — Encounter: Payer: Self-pay | Admitting: Internal Medicine

## 2015-06-26 ENCOUNTER — Ambulatory Visit (INDEPENDENT_AMBULATORY_CARE_PROVIDER_SITE_OTHER): Payer: Medicare Other | Admitting: Internal Medicine

## 2015-06-26 VITALS — BP 136/70 | HR 81 | Ht 71.0 in | Wt 206.4 lb

## 2015-06-26 DIAGNOSIS — I5032 Chronic diastolic (congestive) heart failure: Secondary | ICD-10-CM

## 2015-06-26 DIAGNOSIS — K922 Gastrointestinal hemorrhage, unspecified: Secondary | ICD-10-CM | POA: Diagnosis not present

## 2015-06-26 DIAGNOSIS — I482 Chronic atrial fibrillation, unspecified: Secondary | ICD-10-CM

## 2015-06-26 DIAGNOSIS — E785 Hyperlipidemia, unspecified: Secondary | ICD-10-CM | POA: Diagnosis not present

## 2015-06-26 NOTE — Progress Notes (Signed)
OFFICE NOTE  Chief Complaint:  Follow-up to discuss anticoagulation.  Primary Care Physician: Rogelia Boga, MD  HPI:  Corey Mcdonald is an 82 year old gentleman with history of coronary bypass in 1997 and had a non-ST-elevation MI in 2010 which was due to the loss of vein graft to the right coronary. EF was 45%. The rest of his anatomy includes a LIMA to the LAD, a saphenous vein graft to an OM and a saphenous vein graft to a diagonal. The saphenous vein graft to the RCA was occluded in 2013. He also has hypertension which has been well controlled and dyslipidemia on Crestor. In addition, he had an abdominal aortic aneurysm and underwent stent grafting. He also has lower extremity claudication with stable ABIs in April 2013 with 0.77 on the left and 0.92 on the right, otherwise no other symptoms. He is being followed by Dr. early for this, who placed his stent graft. He's recently seen by him in June of this year and felt that his claudication is not lifestyle limiting and did not recommend any further treatment at this time. He does have a history of PAF but no recurrence recently but he is not on Coumadin due to high risk of falls and he has significant visual impairment.   Corey Mcdonald returns today for followup. He reports that he has been doing fairly well. He is having some difficulty with his wife who I guess is aching and may be declining. He's recently had problems with weight loss. He says that his appetite is good and he eats about the same but has lost over 30 pounds. His heart rate is noted to be slower today in blood pressure is borderline low. He also been complaining of night sweats recently.  I the pleasure see Corey Mcdonald back in the office today. He is overall doing fairly well although does report some progressive fatigue. He seems to have noted this over the past several years, not necessarily recently. He does have a history of paroxysmal atrial fibrillation in  the distant past but has been in sinus rhythm most of the time when I see him in the office. Today however he is in atrial fibrillation. He's been maintained on aspirin apparently due to visual impairment and high falls and was previously on warfarin. I had a discussion today in the office with his daughter who says that he is very compliant with his medicines as she arranges them for him and I did not feel that visual impairment would be something that she keep him from being on anticoagulation. He also is not having any falls. He in fact walks about a mile to mile and half every day.  Corey Mcdonald returns today for follow-up.  He remains in persistent atrial fibrillation. He was scheduled to have a cardioversion however it was correctly noted that he was under dosed and on 2.5 mg twice a day of Eliquis,  However should've been on the 5 mg twice daily dosing. Fortunately he's had no problems with this and was correctly switched to the 5 mg twice daily dose. This was as of November 11. We therefore will have to postpone his cardioversion until after December 11.  Corey Mcdonald returns for follow-up. Unfortunately he's been hospitalized twice for GI bleeding. The last occurrence required transfusion of 2 units packed red blood cells. This is after increasing his Eliquis from 2.5-5 mg as it was determined he was possibly under dosed. We have not yet been able to undergo cardioversion  and I'm a little leery about pursuing that now because of his ongoing bleeding. Is not clear that he could be chronically anticoagulated. Currently he is on no blood thinners. His bleeding has stopped. We discussed several options in the office today including aspirin only therapy and also considering the possibility of a Watchman left atrial appendage occluder device. Would require a short period of anticoagulation to have this placed, however it could pay dividends as far as reduction in Mcdonald risk over the long-term.   Corey Mcdonald  returns today for follow-up. He's here today to discuss anticoagulation with his daughter. Despite being on warfarin and Eliquis at varying doses he's had 2 episodes of significant GI bleeding. He was on aspirin as well.  His  CHADSVASC score is 4. I'm hesitant to put him back on anything other than aspirin at this point.  We did discuss the possibility of the watchman device.  06/26/2015  Corey Mcdonald was seen back today in follow-up. In the interim he saw Dr. Johney Frame. They had discussed the possibility of a watchman device. It was felt that Corey Mcdonald was not a good candidate for the watchman device. It was also thought that he had had GI bleeding in the past related to be in on Eliquis and aspirin. He felt that discontinuing aspirin and continuing Eliquis monotherapy may reduce the bleeding risk. So far this is working well. Corey Mcdonald. His A. fib is rate controlled and he is asymptomatic with it.  PMHx:  Past Medical History  Diagnosis Date  . ABDOMINAL AORTIC ANEURYSM   . Atrial fibrillation (HCC)   . BENIGN PROSTATIC HYPERTROPHY, HX OF    . COLONIC POLYPS, HX OF   . HYPERLIPIDEMIA   . HYPERTENSION   . MACULAR DEGENERATION   . PEPTIC ULCER DISEASE, HX OF   . RESTLESS LEGS SYNDROME   . SLEEP APNEA   . VERTIGO   . CORONARY ARTERY DISEASE     non-ST segment MI, March 15, 2010  . Anemia January 2012    admitted with acute MI March 15, 2010 and has significant acute blood loss anemia requiring transfusions of two units of packed RBCs.  Status post upper endoscopy March 22, 2010 without High-Risk bleeding lesion.  History of peptic ulcer disease  . Myocardial infarction (HCC) 02/2010  . AAA (abdominal aortic aneurysm) (HCC)   . PAD (peripheral artery disease) (HCC)   . PVD (peripheral vascular disease) (HCC)   . History of Doppler ultrasound 05/2011    h/o claudication, stable ABIs  . History of nuclear stress  test 2007    persantine; normal, low risk     Past Surgical History  Procedure Laterality Date  . Inguinal hernia repair      Left inguinal  . Coronary artery bypass graft  12/1995    x5; LIMA to ALD, vein graft to RCA (now occluded),SVG to OM, SVG to diagonal  . Abdominal aortic aneurysm repair      stenting   . Cardiac catheterization  02/2010    r/t NSTEMI; loss of SVG to RCA  . Transthoracic echocardiogram  04/2011    EF 55-60%; mild LVH; grade 1 diastolic dysfunction  . Esophagogastroduodenoscopy N/A 01/28/2015    Procedure: ESOPHAGOGASTRODUODENOSCOPY (EGD);  Surgeon: Vida Rigger, MD;  Location: [facility] ENDOSCOPY;  Service: Endoscopy;  Laterality: N/A;  . Colonoscopy with propofol N/A 02/04/2015    Procedure: COLONOSCOPY WITH PROPOFOL;  Surgeon: Oswaldo Done  Bosie ClosSchooler, MD;  [facility];  Service: Endoscopy;  Laterality: N/A;    FAMHx:  Family History  Problem Relation Age of Onset  . Heart disease Mother   . Heart disease Father   . Heart disease Sister   . Macular degeneration Sister   . Heart disease Sister   . Mcdonald Father   . Mcdonald Brother   . Hypertension Sister   . Heart Problems Sister     x2    SOCHx:   reports that he quit smoking about 28 years ago. His smoking use included Cigarettes. He has a 40 pack-year smoking history. He has never used smokeless tobacco. He reports that he does not drink alcohol or use illicit drugs.  ALLERGIES:  Allergies  Allergen Reactions  . Cilostazol Other (See Comments)    Adverse reaction per patient - leg cramps  . Doxazosin Other (See Comments)    INTOLERANCE -  leg cramps     ROS: Pertinent items noted in HPI and remainder of comprehensive ROS otherwise negative.  HOME MEDS: Current Outpatient Prescriptions  Medication Sig Dispense Refill  . acetaminophen (TYLENOL) 325 MG tablet Take 650 mg by mouth every 6 (six) hours as needed for moderate pain.     Marland Kitchen. apixaban (ELIQUIS) 5 MG TABS tablet Take 1 tablet (5 mg  total) by mouth 2 (two) times daily. 60 tablet 6  . atorvastatin (LIPITOR) 20 MG tablet Take 1 tablet (20 mg total) by mouth daily. 90 tablet 1  . docusate sodium (COLACE) 100 MG capsule Take 100 mg by mouth at bedtime.     . ferrous sulfate 325 (65 FE) MG tablet Take 325 mg by mouth daily.    . metoprolol tartrate (LOPRESSOR) 25 MG tablet Take 25 mg by mouth 2 (two) times daily.    . Multiple Vitamin (MULTIVITAMIN) tablet Take 1 tablet by mouth daily.     . nitroGLYCERIN (NITROSTAT) 0.4 MG SL tablet Place 1 tablet (0.4 mg total) under the tongue every 5 (five) minutes as needed. FOR CHEST PAIN 25 tablet 6  . pantoprazole (PROTONIX) 40 MG tablet TAKE ONE TABLET BY MOUTH ONCE DAILY 60 tablet 2  . tamsulosin (FLOMAX) 0.4 MG CAPS capsule Take 0.4 mg by mouth every evening.    . [DISCONTINUED] carbidopa-levodopa (SINEMET CR) 25-100 MG per tablet Take by mouth. 1/2 tablet at bedtime      No current facility-administered medications for this visit.    LABS/IMAGING: No results found for this or any previous visit (from the past 48 hour(s)). No results found.  VITALS: BP 136/70 mmHg  Pulse 81  Ht 5\' 11"  (1.803 m)  Wt 206 lb 6.4 oz (93.622 kg)  BMI 28.80 kg/m2  EXAM: GEN: Awake, NAD HEENT: PERRLA, EOMI Cardiovascular: RRR, NL S1/S2, Lungs: Clear bilaterally Abdomen: Soft, nontender Extremities: No edema Neurologically: Grossly intact Psych: Pleasant  EKG: Atrial fibrillation at 81  ASSESSMENT: 1. Coronary artery disease status post four-vessel CABG with an occluded graft to the right coronary in 1997 2. Ischemic cardiomyopathy EF 45% 3. Abdominal aortic aneurysm status post stent grafting 4. Bilateral claudication with reduced ABIs 5. Hypertension 6. Dyslipidemia 7. Persistent atrial fibrillation-CHADSVASC 4 - on Eliquis 8. Recurrent GI bleeding - was on Eliquis and aspirin (aspirin stopped)  PLAN: 1.    Corey Mcdonald is rate controlled with regard to atrial fibrillation. We've  restarted Eliquis and her monitoring closely but he has not had recurrent GI bleeding and is not on antiplatelets. He is not  a candidate for watchman therapy. Overall seems to be doing well. He appears euvolemic on exam. Plan to see him back in 6 months or sooner as necessary.  Chrystie Nose, MD, [facility] Attending Cardiologist [facility]  Chrystie Nose 06/26/2015, 12:54 PM

## 2015-06-26 NOTE — Patient Instructions (Signed)
Your physician wants you to follow-up in: 6 months with Dr. Hilty. You will receive a reminder letter in the mail two months in advance. If you don't receive a letter, please call our office to schedule the follow-up appointment.    

## 2015-07-15 ENCOUNTER — Other Ambulatory Visit: Payer: Self-pay

## 2015-07-15 MED ORDER — TAMSULOSIN HCL 0.4 MG PO CAPS: 0.4000 mg | ORAL_CAPSULE | Freq: Every evening | ORAL | Status: DC

## 2015-07-17 ENCOUNTER — Ambulatory Visit (INDEPENDENT_AMBULATORY_CARE_PROVIDER_SITE_OTHER): Payer: Medicare Other | Admitting: Pulmonary Disease

## 2015-07-17 ENCOUNTER — Encounter: Payer: Self-pay | Admitting: Pulmonary Disease

## 2015-07-17 VITALS — BP 140/82 | HR 69 | Ht 71.0 in | Wt 205.2 lb

## 2015-07-17 DIAGNOSIS — I5032 Chronic diastolic (congestive) heart failure: Secondary | ICD-10-CM | POA: Diagnosis not present

## 2015-07-17 DIAGNOSIS — G4733 Obstructive sleep apnea (adult) (pediatric): Secondary | ICD-10-CM | POA: Diagnosis not present

## 2015-07-17 DIAGNOSIS — Z9989 Dependence on other enabling machines and devices: Secondary | ICD-10-CM

## 2015-07-17 MED ORDER — FUROSEMIDE 20 MG PO TABS: 20.0000 mg | ORAL_TABLET | Freq: Every day | ORAL | Status: DC

## 2015-07-17 NOTE — Patient Instructions (Signed)
Call DME about CPAP turning off adjustment Increased CPAP pressure to 10 cm  Lasix 20 mg daily x 5 days

## 2015-07-17 NOTE — Assessment & Plan Note (Signed)
Lasix 20mg daily x 5 days

## 2015-07-17 NOTE — Assessment & Plan Note (Signed)
Call DME about CPAP turning off adjustment Increased CPAP pressure to 10 cm  Weight loss encouraged, compliance with goal of at least 4-6 hrs every night is the expectation. Advised against medications with sedative side effects Cautioned against driving when sleepy - understanding that sleepiness will vary on a day to day basis

## 2015-07-17 NOTE — Progress Notes (Signed)
   Subjective:    Patient ID: Corey Mcdonald, male    DOB: 1923-05-21, 82 y.o.   MRN: [id number]  HPI   82 year old man, legally blind  for FU of OSA. He is accompanied by his son. He has paroxysmal atrial fibrillation and is on eliquis  He was diagnosed more than 10 years ago. Repeat HST  09/2014 showed severe OSA with AHI 43/hour with lowest desaturation of 81%. He has been maintained on CPAP therapy for many years with a small nasal mask. He had some problems with DME-[employer] and has not changed supplies in 3 years.  07/17/2015  Chief Complaint  Patient presents with  . Follow-up    doing well on CPAP. no concerns.    Accompanied by his daughter Amy Last visit regarding a new CPAP machine, he has adjusted to this well. It has a feature that automatically turns it off when he takes the mask off. He feels that this feature is not working well lately and occasionally the machine does not turn off even after taking his mask off  He has no problems with pressure or with the mask His weight is unchanged  Review of download shows excellent compliance, residual AHI 15/hour and 8 cm and no leak  He reports bipedal edema   Review of Systems Patient denies significant dyspnea,cough, hemoptysis,  chest pain, palpitations, pedal edema, orthopnea, paroxysmal nocturnal dyspnea, lightheadedness, nausea, vomiting, abdominal or  leg pains      Objective:   Physical Exam  Gen. Pleasant, well-nourished, in no distress ENT - no lesions, no post nasal drip Neck: No JVD, no thyromegaly, no carotid bruits Lungs: no use of accessory muscles, no dullness to percussion, clear without rales or rhonchi  Cardiovascular: Rhythm regular, heart sounds  normal, no murmurs or gallops, 1+ peripheral edema Musculoskeletal: No deformities, no cyanosis or clubbing        Assessment & Plan:

## 2015-08-07 ENCOUNTER — Encounter: Payer: Self-pay | Admitting: Pulmonary Disease

## 2015-08-11 ENCOUNTER — Ambulatory Visit: Payer: Medicare Other | Admitting: Vascular Surgery

## 2015-08-11 ENCOUNTER — Other Ambulatory Visit (HOSPITAL_COMMUNITY): Payer: Medicare Other

## 2015-08-14 ENCOUNTER — Other Ambulatory Visit: Payer: Self-pay

## 2015-08-14 MED ORDER — TAMSULOSIN HCL 0.4 MG PO CAPS: 0.4000 mg | ORAL_CAPSULE | Freq: Every evening | ORAL | Status: DC

## 2015-09-07 DIAGNOSIS — G4733 Obstructive sleep apnea (adult) (pediatric): Secondary | ICD-10-CM | POA: Diagnosis not present

## 2015-09-07 DIAGNOSIS — G4730 Sleep apnea, unspecified: Secondary | ICD-10-CM | POA: Diagnosis not present

## 2015-09-07 DIAGNOSIS — I10 Essential (primary) hypertension: Secondary | ICD-10-CM | POA: Diagnosis not present

## 2015-09-07 DIAGNOSIS — I2510 Atherosclerotic heart disease of native coronary artery without angina pectoris: Secondary | ICD-10-CM | POA: Diagnosis not present

## 2015-09-23 ENCOUNTER — Telehealth: Payer: Self-pay | Admitting: Internal Medicine

## 2015-09-23 MED ORDER — ATORVASTATIN CALCIUM 20 MG PO TABS: 20.0000 mg | ORAL_TABLET | Freq: Every day | ORAL | 1 refills | Status: DC

## 2015-09-23 NOTE — Telephone Encounter (Signed)
Rx sent 

## 2015-09-23 NOTE — Telephone Encounter (Signed)
Pt request refill  atorvastatin (LIPITOR) 20 MG tablet  Pt has been trying to get for 2 weeks with no response.  I do not see a request, but  can we fill this asap?  [facility]

## 2015-09-25 ENCOUNTER — Telehealth: Payer: Self-pay | Admitting: Internal Medicine

## 2015-09-25 NOTE — Telephone Encounter (Signed)
New message      Calling to schedule an appt with the pharmacist for an "eliquis follow up".  Pt thinks he needs lab work also.  No lab order---not sure what pt needs . Pt did not want to schedule 60mo follow up with dr Rennis Golden.  Is pt due an appt with the pharmacist?

## 2015-09-25 NOTE — Telephone Encounter (Signed)
He is usually seen over in the anticoag clinic at [facility]. They do follow their some of their Eliquis patients every 6 months. Looks like his last visit with them was in March so he could be scheduled with them in Sept. They will order a BMET and CBC at that visit.

## 2015-11-03 ENCOUNTER — Other Ambulatory Visit (HOSPITAL_COMMUNITY): Payer: Medicare Other

## 2015-11-03 ENCOUNTER — Ambulatory Visit: Payer: Medicare Other | Admitting: Vascular Surgery

## 2015-11-10 ENCOUNTER — Other Ambulatory Visit: Payer: Self-pay | Admitting: Internal Medicine

## 2015-12-01 ENCOUNTER — Other Ambulatory Visit (HOSPITAL_COMMUNITY): Payer: Medicare Other

## 2015-12-01 ENCOUNTER — Ambulatory Visit: Payer: Medicare Other | Admitting: Vascular Surgery

## 2015-12-16 ENCOUNTER — Ambulatory Visit (INDEPENDENT_AMBULATORY_CARE_PROVIDER_SITE_OTHER): Payer: Medicare Other | Admitting: Adult Health

## 2015-12-16 ENCOUNTER — Encounter: Payer: Self-pay | Admitting: Adult Health

## 2015-12-16 VITALS — BP 112/70 | Ht 71.0 in | Wt 201.9 lb

## 2015-12-16 DIAGNOSIS — Z23 Encounter for immunization: Secondary | ICD-10-CM | POA: Diagnosis not present

## 2015-12-16 NOTE — Progress Notes (Signed)
Subjective:    Patient ID: Corey Mcdonald, male    DOB: 17-Aug-1923, 82 y.o.   MRN: [id number]  HPI  82 year old male who  has a past medical history of AAA (abdominal aortic aneurysm) (HCC); ABDOMINAL AORTIC ANEURYSM; Anemia (January 2012); Atrial fibrillation (HCC); BENIGN PROSTATIC HYPERTROPHY, HX OF ( ); COLONIC POLYPS, HX OF; CORONARY ARTERY DISEASE; History of Doppler ultrasound (05/2011); History of nuclear stress test (2007); HYPERLIPIDEMIA; HYPERTENSION; MACULAR DEGENERATION; Myocardial infarction (02/2010); PAD (peripheral artery disease) (HCC); PEPTIC ULCER DISEASE, HX OF; PVD (peripheral vascular disease) (HCC); RESTLESS LEGS SYNDROME; SLEEP APNEA; and VERTIGO. He presents to the office today with his son to be evaluated for the flu shot. Per patients son, due to his fathers medical history he would like to make sure that his father can get a flu shot. The son is concerned because he believes that the flu shot may have caused his mother to die from CHF?  The patient has received flu shots in the past   Review of Systems  Constitutional: Negative.   Respiratory: Negative.   Cardiovascular: Positive for leg swelling (chronic).  Gastrointestinal: Negative.   Neurological: Negative.   All other systems reviewed and are negative.  Past Medical History:  Diagnosis Date  . AAA (abdominal aortic aneurysm) (HCC)   . ABDOMINAL AORTIC ANEURYSM   . Anemia January 2012   admitted with acute MI March 15, 2010 and has significant acute blood loss anemia requiring transfusions of two units of packed RBCs.  Status post upper endoscopy March 22, 2010 without High-Risk bleeding lesion.  History of peptic ulcer disease  . Atrial fibrillation (HCC)   . BENIGN PROSTATIC HYPERTROPHY, HX OF    . COLONIC POLYPS, HX OF   . CORONARY ARTERY DISEASE    non-ST segment MI, March 15, 2010  . History of Doppler ultrasound 05/2011   h/o claudication, stable ABIs  . History of nuclear stress test 2007   persantine; normal, low risk   . HYPERLIPIDEMIA   . HYPERTENSION   . MACULAR DEGENERATION   . Myocardial infarction 02/2010  . PAD (peripheral artery disease) (HCC)   . PEPTIC ULCER DISEASE, HX OF   . PVD (peripheral vascular disease) (HCC)   . RESTLESS LEGS SYNDROME   . SLEEP APNEA   . VERTIGO     Social History   Social History  . Marital status: Married    Spouse name: N/A  . Number of children: 2  . Years of education: N/A   Occupational History  . Not on file.   Social History Main Topics  . Smoking status: Former Smoker    Packs/day: 1.00    Years: 40.00    Types: Cigarettes    Quit date: 02/22/1987  . Smokeless tobacco: Never Used  . Alcohol use No  . Drug use: No  . Sexual activity: Not on file   Other Topics Concern  . Not on file   Social History Narrative  . No narrative on file    Past Surgical History:  Procedure Laterality Date  . ABDOMINAL AORTIC ANEURYSM REPAIR     stenting   . CARDIAC CATHETERIZATION  02/2010   r/t NSTEMI; loss of SVG to RCA  . COLONOSCOPY WITH PROPOFOL N/A 02/04/2015   Procedure: COLONOSCOPY WITH PROPOFOL;  Surgeon: Charlott Rakes, MD;  Location: [facility] ENDOSCOPY;  Service: Endoscopy;  Laterality: N/A;  . CORONARY ARTERY BYPASS GRAFT  12/1995   x5; LIMA to ALD, vein graft to RCA (now  occluded),SVG to OM, SVG to diagonal  . ESOPHAGOGASTRODUODENOSCOPY N/A 01/28/2015   Procedure: ESOPHAGOGASTRODUODENOSCOPY (EGD);  Surgeon: Vida Rigger, MD;  Location: [facility] ENDOSCOPY;  Service: Endoscopy;  Laterality: N/A;  . INGUINAL HERNIA REPAIR     Left inguinal  . TRANSTHORACIC ECHOCARDIOGRAM  04/2011   EF 55-60%; mild LVH; grade 1 diastolic dysfunction    Family History  Problem Relation Age of Onset  . Heart disease Mother   . Heart disease Father   . Heart disease Sister   . Macular degeneration Sister   . Heart disease Sister   . Stroke Father   . Stroke Brother   . Hypertension Sister   . Heart Problems Sister     x2    Allergies    Allergen Reactions  . Cilostazol Other (See Comments)    Adverse reaction per patient - leg cramps  . Doxazosin Other (See Comments)    INTOLERANCE -  leg cramps     Current Outpatient Prescriptions on File Prior to Visit  Medication Sig Dispense Refill  . acetaminophen (TYLENOL) 325 MG tablet Take 650 mg by mouth every 6 (six) hours as needed for moderate pain.     Marland Kitchen apixaban (ELIQUIS) 5 MG TABS tablet Take 1 tablet (5 mg total) by mouth 2 (two) times daily. 60 tablet 6  . atorvastatin (LIPITOR) 20 MG tablet Take 1 tablet (20 mg total) by mouth daily. 90 tablet 1  . docusate sodium (COLACE) 100 MG capsule Take 250 mg by mouth at bedtime.     . ferrous sulfate 325 (65 FE) MG tablet Take 325 mg by mouth daily.    . furosemide (LASIX) 20 MG tablet Take 1 tablet (20 mg total) by mouth daily. 5 tablet 0  . metoprolol tartrate (LOPRESSOR) 25 MG tablet Take 25 mg by mouth 2 (two) times daily.    . Multiple Vitamin (MULTIVITAMIN) tablet Take 1 tablet by mouth daily.     . nitroGLYCERIN (NITROSTAT) 0.4 MG SL tablet Place 1 tablet (0.4 mg total) under the tongue every 5 (five) minutes as needed. FOR CHEST PAIN 25 tablet 6  . pantoprazole (PROTONIX) 40 MG tablet TAKE ONE TABLET BY MOUTH ONCE DAILY 60 tablet 2  . tamsulosin (FLOMAX) 0.4 MG CAPS capsule TAKE ONE CAPSULE BY MOUTH ONCE DAILY IN THE EVENING 30 capsule 2  . [DISCONTINUED] carbidopa-levodopa (SINEMET CR) 25-100 MG per tablet Take by mouth. 1/2 tablet at bedtime      No current facility-administered medications on file prior to visit.     BP 112/70   Ht 5\' 11"  (1.803 m)   Wt 201 lb 14.4 oz (91.6 kg)   BMI 28.16 kg/m       Objective:   Physical Exam  Constitutional: He is oriented to person, place, and time. He appears well-developed and well-nourished. No distress.  Musculoskeletal: He exhibits edema (trace pitting edema in lower extremities R>L).  Neurological: He is alert and oriented to person, place, and time.  Skin: Skin  is warm and dry. No rash noted. He is not diaphoretic. No erythema. No pallor.  Psychiatric: He has a normal mood and affect. His behavior is normal. Judgment and thought content normal.  Nursing note and vitals reviewed.     Assessment & Plan:  1. Need for prophylactic vaccination and inoculation against influenza - Advised patient that people with heart failure benefit greatly from the flu vaccination. Being 82 years old, the benefits outweigh the risks.The patient agreed  - Flu vaccine  HIGH DOSE PF  Shirline Freesory Porschia Willbanks, NP

## 2015-12-24 ENCOUNTER — Other Ambulatory Visit: Payer: Self-pay | Admitting: Internal Medicine

## 2015-12-26 ENCOUNTER — Other Ambulatory Visit: Payer: Self-pay | Admitting: Internal Medicine

## 2016-01-05 ENCOUNTER — Other Ambulatory Visit: Payer: Self-pay | Admitting: *Deleted

## 2016-01-05 DIAGNOSIS — I714 Abdominal aortic aneurysm, without rupture, unspecified: Secondary | ICD-10-CM

## 2016-01-05 DIAGNOSIS — Z95828 Presence of other vascular implants and grafts: Secondary | ICD-10-CM

## 2016-01-06 ENCOUNTER — Encounter: Payer: Self-pay | Admitting: Pulmonary Disease

## 2016-01-06 ENCOUNTER — Encounter: Payer: Self-pay | Admitting: Vascular Surgery

## 2016-01-08 ENCOUNTER — Other Ambulatory Visit: Payer: Self-pay | Admitting: Cardiology

## 2016-01-08 ENCOUNTER — Ambulatory Visit (INDEPENDENT_AMBULATORY_CARE_PROVIDER_SITE_OTHER): Payer: Medicare Other | Admitting: Pulmonary Disease

## 2016-01-08 ENCOUNTER — Encounter: Payer: Self-pay | Admitting: Pulmonary Disease

## 2016-01-08 DIAGNOSIS — G2581 Restless legs syndrome: Secondary | ICD-10-CM

## 2016-01-08 DIAGNOSIS — Z9989 Dependence on other enabling machines and devices: Secondary | ICD-10-CM

## 2016-01-08 DIAGNOSIS — G4733 Obstructive sleep apnea (adult) (pediatric): Secondary | ICD-10-CM | POA: Diagnosis not present

## 2016-01-08 NOTE — Assessment & Plan Note (Signed)
CPAP is set at 10 cm  CPAP supplies will be renewed for a year  He still has residual events on his CPAP but he has adjusted so well that I did not want to disrupt that-hence we'll keep him on the same pressure settings  Weight loss encouraged, compliance with goal of at least 4-6 hrs every night is the expectation. Advised against medications with sedative side effects Cautioned against driving when sleepy - understanding that sleepiness will vary on a day to day basis

## 2016-01-08 NOTE — Progress Notes (Signed)
   Subjective:    Patient ID: Corey CaroliCarl Bellard, male    DOB: 14-Jul-1923, 82 y.o.   MRN: [id number]  HPI  82 year old man, legally blind  for FU of OSA.  He has paroxysmal atrial fibrillation and is on eliquis  He was diagnosed more than 10 years ago.  HST  09/2014 showed severe OSA with AHI 43/hour with lowest desaturation of 81%. He has been maintained on CPAP therapy for many years with a small nasal mask.   01/08/2016  Chief Complaint  Patient presents with  . Follow-up    Pt. uses a CPAP machine,Uses it every night, Would like to discuss how it continues to run when he takes the mask,     Accompanied by his daughter Amy Regarding the new CPAP machine in 2016 and he is settled down with this. On his last visit he had residual AHI about 15/hour and CPAP pressure was increased from 8-10 cm Download today still shows residual AHI of about 15/hour without mention of centrals, he is a mild leak, usage is more than 11 hours He has figured out that if he keeps the mask face up then machine does cut off on its own, if he puts the mask face down and it keeps running He lives in independent living and his longest period of sleep is during the night even though he is blind    Past Medical History:  Diagnosis Date  . AAA (abdominal aortic aneurysm) (HCC)   . ABDOMINAL AORTIC ANEURYSM   . Anemia January 2012   admitted with acute MI March 15, 2010 and has significant acute blood loss anemia requiring transfusions of two units of packed RBCs.  Status post upper endoscopy March 22, 2010 without High-Risk bleeding lesion.  History of peptic ulcer disease  . Atrial fibrillation (HCC)   . BENIGN PROSTATIC HYPERTROPHY, HX OF    . COLONIC POLYPS, HX OF   . CORONARY ARTERY DISEASE    non-ST segment MI, March 15, 2010  . History of Doppler ultrasound 05/2011   h/o claudication, stable ABIs  . History of nuclear stress test 2007   persantine; normal, low risk   . HYPERLIPIDEMIA   .  HYPERTENSION   . MACULAR DEGENERATION   . Myocardial infarction 02/2010  . PAD (peripheral artery disease) (HCC)   . PEPTIC ULCER DISEASE, HX OF   . PVD (peripheral vascular disease) (HCC)   . RESTLESS LEGS SYNDROME   . SLEEP APNEA   . VERTIGO       Review of Systems neg for any significant sore throat, dysphagia, itching, sneezing, nasal congestion or excess/ purulent secretions, fever, chills, sweats, unintended wt loss, pleuritic or exertional cp, hempoptysis, orthopnea pnd or change in chronic leg swelling. Also denies presyncope, palpitations, heartburn, abdominal pain, nausea, vomiting, diarrhea or change in bowel or urinary habits, dysuria,hematuria, rash, arthralgias, visual complaints, headache, numbness weakness or ataxia.     Objective:   Physical Exam  Gen. Pleasant, obese, in no distress ENT - blind, no post nasal drip Neck: No JVD, no thyromegaly, no carotid bruits Lungs: no use of accessory muscles, no dullness to percussion, decreased without rales or rhonchi  Cardiovascular: Rhythm regular, heart sounds  normal, no murmurs or gallops, no peripheral edema Musculoskeletal: No deformities, no cyanosis or clubbing , no tremors       Assessment & Plan:

## 2016-01-08 NOTE — Addendum Note (Signed)
Addended by: Garfield CorneaMABRY, Barbera Perritt L on: 01/08/2016 11:22 AM   Modules accepted: Orders

## 2016-01-08 NOTE — Patient Instructions (Signed)
CPAP is set at 10 cm  CPAP supplies will be renewed for a year

## 2016-01-08 NOTE — Assessment & Plan Note (Signed)
Iron  being supplemented

## 2016-01-09 ENCOUNTER — Observation Stay (HOSPITAL_COMMUNITY)
Admission: EM | Admit: 2016-01-09 | Discharge: 2016-01-11 | Disposition: A | Discharge status: Home / Self Care | Payer: Medicare Other | Attending: Nephrology | Admitting: Nephrology

## 2016-01-09 ENCOUNTER — Emergency Department (HOSPITAL_COMMUNITY): Payer: Medicare Other

## 2016-01-09 ENCOUNTER — Encounter (HOSPITAL_COMMUNITY): Payer: Self-pay

## 2016-01-09 DIAGNOSIS — R4781 Slurred speech: Secondary | ICD-10-CM | POA: Diagnosis not present

## 2016-01-09 DIAGNOSIS — R29898 Other symptoms and signs involving the musculoskeletal system: Secondary | ICD-10-CM

## 2016-01-09 DIAGNOSIS — I739 Peripheral vascular disease, unspecified: Secondary | ICD-10-CM | POA: Diagnosis not present

## 2016-01-09 DIAGNOSIS — Z951 Presence of aortocoronary bypass graft: Secondary | ICD-10-CM | POA: Insufficient documentation

## 2016-01-09 DIAGNOSIS — I481 Persistent atrial fibrillation: Secondary | ICD-10-CM | POA: Diagnosis not present

## 2016-01-09 DIAGNOSIS — R200 Anesthesia of skin: Secondary | ICD-10-CM | POA: Insufficient documentation

## 2016-01-09 DIAGNOSIS — R42 Dizziness and giddiness: Secondary | ICD-10-CM | POA: Diagnosis not present

## 2016-01-09 DIAGNOSIS — R55 Syncope and collapse: Secondary | ICD-10-CM | POA: Insufficient documentation

## 2016-01-09 DIAGNOSIS — N183 Chronic kidney disease, stage 3 (moderate): Secondary | ICD-10-CM | POA: Diagnosis not present

## 2016-01-09 DIAGNOSIS — Z87891 Personal history of nicotine dependence: Secondary | ICD-10-CM | POA: Insufficient documentation

## 2016-01-09 DIAGNOSIS — I6789 Other cerebrovascular disease: Secondary | ICD-10-CM | POA: Diagnosis not present

## 2016-01-09 DIAGNOSIS — I639 Cerebral infarction, unspecified: Secondary | ICD-10-CM

## 2016-01-09 DIAGNOSIS — N400 Enlarged prostate without lower urinary tract symptoms: Secondary | ICD-10-CM | POA: Diagnosis not present

## 2016-01-09 DIAGNOSIS — R531 Weakness: Secondary | ICD-10-CM | POA: Insufficient documentation

## 2016-01-09 DIAGNOSIS — E785 Hyperlipidemia, unspecified: Secondary | ICD-10-CM | POA: Diagnosis not present

## 2016-01-09 DIAGNOSIS — I252 Old myocardial infarction: Secondary | ICD-10-CM | POA: Diagnosis not present

## 2016-01-09 DIAGNOSIS — R2990 Unspecified symptoms and signs involving the nervous system: Secondary | ICD-10-CM

## 2016-01-09 DIAGNOSIS — I10 Essential (primary) hypertension: Secondary | ICD-10-CM | POA: Diagnosis present

## 2016-01-09 DIAGNOSIS — I2510 Atherosclerotic heart disease of native coronary artery without angina pectoris: Secondary | ICD-10-CM | POA: Insufficient documentation

## 2016-01-09 DIAGNOSIS — I130 Hypertensive heart and chronic kidney disease with heart failure and stage 1 through stage 4 chronic kidney disease, or unspecified chronic kidney disease: Secondary | ICD-10-CM | POA: Insufficient documentation

## 2016-01-09 DIAGNOSIS — I5030 Unspecified diastolic (congestive) heart failure: Secondary | ICD-10-CM | POA: Diagnosis present

## 2016-01-09 DIAGNOSIS — E663 Overweight: Secondary | ICD-10-CM | POA: Diagnosis not present

## 2016-01-09 DIAGNOSIS — H548 Legal blindness, as defined in USA: Secondary | ICD-10-CM | POA: Insufficient documentation

## 2016-01-09 DIAGNOSIS — Z7901 Long term (current) use of anticoagulants: Secondary | ICD-10-CM | POA: Diagnosis not present

## 2016-01-09 DIAGNOSIS — G4733 Obstructive sleep apnea (adult) (pediatric): Secondary | ICD-10-CM | POA: Insufficient documentation

## 2016-01-09 DIAGNOSIS — H3530 Unspecified macular degeneration: Secondary | ICD-10-CM | POA: Diagnosis not present

## 2016-01-09 DIAGNOSIS — E860 Dehydration: Secondary | ICD-10-CM | POA: Diagnosis not present

## 2016-01-09 DIAGNOSIS — G459 Transient cerebral ischemic attack, unspecified: Secondary | ICD-10-CM | POA: Diagnosis not present

## 2016-01-09 DIAGNOSIS — D649 Anemia, unspecified: Secondary | ICD-10-CM | POA: Diagnosis not present

## 2016-01-09 DIAGNOSIS — Z6828 Body mass index (BMI) 28.0-28.9, adult: Secondary | ICD-10-CM | POA: Insufficient documentation

## 2016-01-09 DIAGNOSIS — Z79899 Other long term (current) drug therapy: Secondary | ICD-10-CM | POA: Diagnosis not present

## 2016-01-09 DIAGNOSIS — I5032 Chronic diastolic (congestive) heart failure: Secondary | ICD-10-CM | POA: Diagnosis not present

## 2016-01-09 DIAGNOSIS — I4891 Unspecified atrial fibrillation: Secondary | ICD-10-CM | POA: Diagnosis present

## 2016-01-09 DIAGNOSIS — R29810 Facial weakness: Secondary | ICD-10-CM | POA: Diagnosis not present

## 2016-01-09 DIAGNOSIS — M6281 Muscle weakness (generalized): Secondary | ICD-10-CM | POA: Diagnosis not present

## 2016-01-09 LAB — PROTIME-INR
INR: 1.3
PROTHROMBIN TIME: 16.3 s — AB (ref 11.4–15.2)

## 2016-01-09 LAB — I-STAT CHEM 8, ED
BUN: 28 mg/dL — ABNORMAL HIGH (ref 6–20)
CALCIUM ION: 1.07 mmol/L — AB (ref 1.15–1.40)
CHLORIDE: 105 mmol/L (ref 101–111)
Creatinine, Ser: 1.2 mg/dL (ref 0.61–1.24)
Glucose, Bld: 118 mg/dL — ABNORMAL HIGH (ref 65–99)
HCT: 42 % (ref 39.0–52.0)
HEMOGLOBIN: 14.3 g/dL (ref 13.0–17.0)
Potassium: 4.5 mmol/L (ref 3.5–5.1)
SODIUM: 137 mmol/L (ref 135–145)
TCO2: 23 mmol/L (ref 0–100)

## 2016-01-09 LAB — CBC
HCT: 40.5 % (ref 39.0–52.0)
HEMOGLOBIN: 13.7 g/dL (ref 13.0–17.0)
MCH: 30.2 pg (ref 26.0–34.0)
MCHC: 33.8 g/dL (ref 30.0–36.0)
MCV: 89.4 fL (ref 78.0–100.0)
PLATELETS: 255 10*3/uL (ref 150–400)
RBC: 4.53 MIL/uL (ref 4.22–5.81)
RDW: 14.1 % (ref 11.5–15.5)
WBC: 9.7 10*3/uL (ref 4.0–10.5)

## 2016-01-09 LAB — GLUCOSE, CAPILLARY: Glucose-Capillary: 151 mg/dL — ABNORMAL HIGH (ref 65–99)

## 2016-01-09 LAB — COMPREHENSIVE METABOLIC PANEL
ALBUMIN: 3.9 g/dL (ref 3.5–5.0)
ALT: 23 U/L (ref 17–63)
ANION GAP: 12 (ref 5–15)
AST: 23 U/L (ref 15–41)
Alkaline Phosphatase: 61 U/L (ref 38–126)
BILIRUBIN TOTAL: 0.8 mg/dL (ref 0.3–1.2)
BUN: 20 mg/dL (ref 6–20)
CO2: 20 mmol/L — ABNORMAL LOW (ref 22–32)
Calcium: 9.9 mg/dL (ref 8.9–10.3)
Chloride: 105 mmol/L (ref 101–111)
Creatinine, Ser: 1.26 mg/dL — ABNORMAL HIGH (ref 0.61–1.24)
GFR calc Af Amer: 56 mL/min — ABNORMAL LOW (ref >60)
GFR calc non Af Amer: 48 mL/min — ABNORMAL LOW (ref >60)
GLUCOSE: 117 mg/dL — AB (ref 65–99)
POTASSIUM: 3.9 mmol/L (ref 3.5–5.1)
Sodium: 137 mmol/L (ref 135–145)
TOTAL PROTEIN: 7.6 g/dL (ref 6.5–8.1)

## 2016-01-09 LAB — DIFFERENTIAL
Basophils Absolute: 0 10*3/uL (ref 0.0–0.1)
Basophils Relative: 0 %
EOS ABS: 0.1 10*3/uL (ref 0.0–0.7)
EOS PCT: 1 %
LYMPHS ABS: 1.5 10*3/uL (ref 0.7–4.0)
Lymphocytes Relative: 16 %
Monocytes Absolute: 0.9 10*3/uL (ref 0.1–1.0)
Monocytes Relative: 9 %
NEUTROS PCT: 74 %
Neutro Abs: 7.1 10*3/uL (ref 1.7–7.7)

## 2016-01-09 LAB — CBG MONITORING, ED: GLUCOSE-CAPILLARY: 128 mg/dL — AB (ref 65–99)

## 2016-01-09 LAB — I-STAT TROPONIN, ED: TROPONIN I, POC: 0 ng/mL (ref 0.00–0.08)

## 2016-01-09 LAB — APTT: aPTT: 36 seconds (ref 24–36)

## 2016-01-09 MED ORDER — TAMSULOSIN HCL 0.4 MG PO CAPS
0.4000 mg | ORAL_CAPSULE | Freq: Every day | ORAL | Status: DC
  Administered 2016-01-10: 0.4 mg via ORAL
  Filled 2016-01-09: qty 1

## 2016-01-09 MED ORDER — PANTOPRAZOLE SODIUM 40 MG PO TBEC
40.0000 mg | DELAYED_RELEASE_TABLET | Freq: Every day | ORAL | Status: DC
  Administered 2016-01-10 – 2016-01-11 (×2): 40 mg via ORAL
  Filled 2016-01-09 (×2): qty 1

## 2016-01-09 MED ORDER — DOCUSATE SODIUM 100 MG PO CAPS
200.0000 mg | ORAL_CAPSULE | Freq: Every day | ORAL | Status: DC
  Administered 2016-01-09 – 2016-01-10 (×2): 200 mg via ORAL
  Filled 2016-01-09 (×2): qty 2

## 2016-01-09 MED ORDER — APIXABAN 5 MG PO TABS
5.0000 mg | ORAL_TABLET | Freq: Two times a day (BID) | ORAL | Status: DC
Start: 2016-01-09 — End: 2016-01-11
  Administered 2016-01-09 – 2016-01-11 (×4): 5 mg via ORAL
  Filled 2016-01-09 (×4): qty 1

## 2016-01-09 MED ORDER — ATORVASTATIN CALCIUM 10 MG PO TABS
20.0000 mg | ORAL_TABLET | Freq: Every day | ORAL | Status: DC
  Administered 2016-01-10 – 2016-01-11 (×3): 20 mg via ORAL
  Filled 2016-01-09 (×3): qty 2

## 2016-01-09 MED ORDER — FERROUS GLUCONATE 324 (38 FE) MG PO TABS
324.0000 mg | ORAL_TABLET | Freq: Two times a day (BID) | ORAL | Status: DC
  Administered 2016-01-10 – 2016-01-11 (×3): 324 mg via ORAL
  Filled 2016-01-09 (×4): qty 1

## 2016-01-09 MED ORDER — STROKE: EARLY STAGES OF RECOVERY BOOK
Freq: Once | Status: DC
  Filled 2016-01-09: qty 1

## 2016-01-09 MED ORDER — ONDANSETRON HCL 4 MG/2ML IJ SOLN
4.0000 mg | Freq: Once | INTRAMUSCULAR | Status: AC
  Administered 2016-01-09: 4 mg via INTRAVENOUS
  Filled 2016-01-09: qty 2

## 2016-01-09 NOTE — Progress Notes (Signed)
Came via stretcher from Er. L side weak, blind. Vss.

## 2016-01-09 NOTE — Progress Notes (Signed)
Code stroke called @1223 , Patient arrived to [facility] ED via [facility]  At 1247. As per EMS and patient LSN 1130, when he became dizzy, slurred speech and left arm weakness and numbness.  Patient is dry heaving, c/o feeling dizzy.  NIHSS 3.  Patient takes eliquis and states took this am.

## 2016-01-09 NOTE — ED Notes (Signed)
Attempted report x1. 

## 2016-01-09 NOTE — H&P (Signed)
History and Physical  Corey Mcdonald ZOX:[id number] DOB: 04/24/1923 DOA: 01/09/2016  Referring physician: Dr Jacqulyn Bath, ED physician PCP: Rogelia Boga, MD  Outpatient Specialists:  Dr Rennis Golden (Cardiology) Dr Arbie Cookey (Vascular) Dr Vassie Loll (Pulmonary)  Chief Complaint: left weakness  HPI: Corey Mcdonald is a 82 y.o. male with a history of AAA, anemia, hyperlipidemia, CAD with NSTEMI in 03/15/10, hyperlipidemia, macular degeneration, hypertension, PAD. Patient had left-sided weakness this morning when he stood up just before lunch. He made his way to his bed and called the assisted living staff who called EMT. Left-sided weakness in arm and leg were noted at their arrival. His symptoms were improving gradually.  Emergency Department Course: Code stroke was called. Patient had negative CT of his head. When neurology consulted on the patient, his symptoms were improving. Patient has had a negative CT and negative MRI. He has resolution of his symptoms. Recommendation was made to observe the patient on telemetry for continued workup  Review of Systems:   Pt denies any fevers, chills, nausea, vomiting, diarrhea, constipation, abdominal pain, shortness of breath, dyspnea on exertion, orthopnea, cough, wheezing, palpitations, headache, vision changes, lightheadedness, dizziness, melena, rectal bleeding.  Review of systems are otherwise negative  Past Medical History:  Diagnosis Date  . AAA (abdominal aortic aneurysm) (HCC)   . ABDOMINAL AORTIC ANEURYSM   . Anemia January 2012   admitted with acute MI March 15, 2010 and has significant acute blood loss anemia requiring transfusions of two units of packed RBCs.  Status post upper endoscopy March 22, 2010 without High-Risk bleeding lesion.  History of peptic ulcer disease  . Atrial fibrillation (HCC)   . BENIGN PROSTATIC HYPERTROPHY, HX OF    . COLONIC POLYPS, HX OF   . CORONARY ARTERY DISEASE    non-ST segment MI, March 15, 2010  . History of  Doppler ultrasound 05/2011   h/o claudication, stable ABIs  . History of nuclear stress test 2007   persantine; normal, low risk   . HYPERLIPIDEMIA   . HYPERTENSION   . MACULAR DEGENERATION   . Myocardial infarction 02/2010  . PAD (peripheral artery disease) (HCC)   . PEPTIC ULCER DISEASE, HX OF   . PVD (peripheral vascular disease) (HCC)   . RESTLESS LEGS SYNDROME   . SLEEP APNEA   . VERTIGO    Past Surgical History:  Procedure Laterality Date  . ABDOMINAL AORTIC ANEURYSM REPAIR     stenting   . CARDIAC CATHETERIZATION  02/2010   r/t NSTEMI; loss of SVG to RCA  . COLONOSCOPY WITH PROPOFOL N/A 02/04/2015   Procedure: COLONOSCOPY WITH PROPOFOL;  Surgeon: Charlott Rakes, MD;  Location: [facility] ENDOSCOPY;  Service: Endoscopy;  Laterality: N/A;  . CORONARY ARTERY BYPASS GRAFT  12/1995   x5; LIMA to ALD, vein graft to RCA (now occluded),SVG to OM, SVG to diagonal  . ESOPHAGOGASTRODUODENOSCOPY N/A 01/28/2015   Procedure: ESOPHAGOGASTRODUODENOSCOPY (EGD);  Surgeon: Vida Rigger, MD;  Location: [facility] ENDOSCOPY;  Service: Endoscopy;  Laterality: N/A;  . INGUINAL HERNIA REPAIR     Left inguinal  . TRANSTHORACIC ECHOCARDIOGRAM  04/2011   EF 55-60%; mild LVH; grade 1 diastolic dysfunction   Social History:  reports that he quit smoking about 28 years ago. His smoking use included Cigarettes. He has a 40.00 pack-year smoking history. He has never used smokeless tobacco. He reports that he does not drink alcohol or use drugs. Patient lives at Assisted living facility  Allergies  Allergen Reactions  . Cilostazol Other (See Comments)    Adverse  reaction per patient - leg cramps  . Doxazosin Other (See Comments)    INTOLERANCE -  leg cramps     Family History  Problem Relation Age of Onset  . Heart disease Mother   . Heart disease Father   . Stroke Father   . Heart disease Sister   . Macular degeneration Sister   . Heart disease Sister   . Stroke Brother   . Hypertension Sister   . Heart  Problems Sister     x2     Prior to Admission medications   Medication Sig Start Date End Date Taking? Authorizing Provider  acetaminophen (TYLENOL) 325 MG tablet Take 650 mg by mouth every 6 (six) hours as needed for moderate pain.     Historical Provider, MD  atorvastatin (LIPITOR) 20 MG tablet TAKE ONE TABLET BY MOUTH ONCE DAILY 12/28/15   Gordy SaversPeter F Kwiatkowski, MD  docusate sodium (COLACE) 100 MG capsule Take 250 mg by mouth at bedtime.     Historical Provider, MD  ELIQUIS 5 MG TABS tablet TAKE ONE TABLET BY MOUTH TWICE DAILY 01/08/16   Quintella Reichertraci R Turner, MD  Ferrous Gluconate (IRON 27 PO) Take by mouth.    Historical Provider, MD  furosemide (LASIX) 20 MG tablet Take 1 tablet (20 mg total) by mouth daily. Patient not taking: Reported on 01/08/2016 07/17/15   Oretha Milchakesh V Alva, MD  metoprolol tartrate (LOPRESSOR) 25 MG tablet Take 25 mg by mouth 2 (two) times daily.    Historical Provider, MD  Multiple Vitamin (MULTIVITAMIN) tablet Take 1 tablet by mouth daily.     Historical Provider, MD  nitroGLYCERIN (NITROSTAT) 0.4 MG SL tablet Place 1 tablet (0.4 mg total) under the tongue every 5 (five) minutes as needed. FOR CHEST PAIN 01/20/15   Chrystie NoseKenneth C Hilty, MD  pantoprazole (PROTONIX) 40 MG tablet TAKE ONE TABLET BY MOUTH ONCE DAILY 12/24/15   Chrystie NoseKenneth C Hilty, MD  tamsulosin (FLOMAX) 0.4 MG CAPS capsule TAKE ONE CAPSULE BY MOUTH ONCE DAILY IN THE EVENING 11/11/15   Gordy SaversPeter F Kwiatkowski, MD    Physical Exam: BP 130/67   Pulse 87   Temp 97.5 F (36.4 C) (Oral)   Resp 24   Ht 5\' 11"  (1.803 m)   Wt 92.5 kg (204 lb)   SpO2 95%   BMI 28.45 kg/m   General: Elderly Caucasian male. Awake and alert and oriented x3. No acute cardiopulmonary distress.  HEENT: Normocephalic atraumatic.  Right and left ears normal in appearance.  Pupils equal, round, reactive to light. Extraocular muscles are intact. Sclerae anicteric and noninjected.  Moist mucosal membranes. No mucosal lesions.  Neck: Neck supple without  lymphadenopathy. No carotid bruits. No masses palpated.  Cardiovascular: Irregular rate. No murmurs, rubs, gallops auscultated. No JVD.  Respiratory: Good respiratory effort with no wheezes, rales, rhonchi. Lungs clear to auscultation bilaterally.  No accessory muscle use. Abdomen: Soft, nontender, nondistended. Active bowel sounds. No masses or hepatosplenomegaly  Skin: No rashes, lesions, or ulcerations.  Dry, warm to touch. 2+ dorsalis pedis and radial pulses. Musculoskeletal: No calf or leg pain. All major joints not erythematous nontender.  No upper or lower joint deformation.  Good ROM.  No contractures  Psychiatric: Intact judgment and insight. Pleasant and cooperative. Neurologic: No focal neurological deficits. Strength is 5/5 and symmetric in upper and lower extremities.  Cranial nerves II through XII are grossly intact.           Labs on Admission: I have personally reviewed following labs  and imaging studies  CBC:  Recent Labs Lab 01/09/16 1250 01/09/16 1256  WBC 9.7  --   NEUTROABS 7.1  --   HGB 13.7 14.3  HCT 40.5 42.0  MCV 89.4  --   PLT 255  --    Basic Metabolic Panel:  Recent Labs Lab 01/09/16 1250 01/09/16 1256  NA 137 137  K 3.9 4.5  CL 105 105  CO2 20*  --   GLUCOSE 117* 118*  BUN 20 28*  CREATININE 1.26* 1.20  CALCIUM 9.9  --    GFR: Estimated Creatinine Clearance: 47.6 mL/min (by C-G formula based on SCr of 1.2 mg/dL). Liver Function Tests:  Recent Labs Lab 01/09/16 1250  AST 23  ALT 23  ALKPHOS 61  BILITOT 0.8  PROT 7.6  ALBUMIN 3.9   No results for input(s): LIPASE, AMYLASE in the last 168 hours. No results for input(s): AMMONIA in the last 168 hours. Coagulation Profile:  Recent Labs Lab 01/09/16 1250  INR 1.30   Cardiac Enzymes: No results for input(s): CKTOTAL, CKMB, CKMBINDEX, TROPONINI in the last 168 hours. BNP (last 3 results) No results for input(s): PROBNP in the last 8760 hours. HbA1C: No results for input(s):  HGBA1C in the last 72 hours. CBG:  Recent Labs Lab 01/09/16 1407  GLUCAP 128*   Lipid Profile: No results for input(s): CHOL, HDL, LDLCALC, TRIG, CHOLHDL, LDLDIRECT in the last 72 hours. Thyroid Function Tests: No results for input(s): TSH, T4TOTAL, FREET4, T3FREE, THYROIDAB in the last 72 hours. Anemia Panel: No results for input(s): VITAMINB12, FOLATE, FERRITIN, TIBC, IRON, RETICCTPCT in the last 72 hours. Urine analysis:    Component Value Date/Time   COLORURINE YELLOW 12/17/2010 1123   APPEARANCEUR CLEAR 12/17/2010 1123   LABSPEC 1.011 12/17/2010 1123   PHURINE 7.0 12/17/2010 1123   GLUCOSEU NEGATIVE 12/17/2010 1123   HGBUR NEGATIVE 12/17/2010 1123   HGBUR negative 09/03/2008 1110   BILIRUBINUR n 02/13/2014 1114   KETONESUR NEGATIVE 12/17/2010 1123   PROTEINUR n 02/13/2014 1114   PROTEINUR NEGATIVE 12/17/2010 1123   UROBILINOGEN 0.2 02/13/2014 1114   UROBILINOGEN 0.2 12/17/2010 1123   NITRITE n 02/13/2014 1114   NITRITE NEGATIVE 12/17/2010 1123   LEUKOCYTESUR Negative 02/13/2014 1114   Sepsis Labs: @LABRCNTIP (procalcitonin:4,lacticidven:4) )No results found for this or any previous visit (from the past 240 hour(s)).   Radiological Exams on Admission: Mr Brain Wo Contrast  Result Date: 01/09/2016 CLINICAL DATA:  Left arm weakness. EXAM: MRI HEAD WITHOUT CONTRAST TECHNIQUE: Multiplanar, multiecho pulse sequences of the brain and surrounding structures were obtained without intravenous contrast. COMPARISON:  Head CT from earlier today FINDINGS: Brain: No acute infarction, hemorrhage, hydrocephalus, extra-axial collection or mass lesion. Moderate cerebral white matter disease consistent with chronic microvascular ischemia. Generalized cerebral volume loss with ventriculomegaly, commonly seen by this age. Vascular: Preserved flow voids. Skull and upper cervical spine: Negative Sinuses/Orbits: Left cataract resection.  No acute finding. IMPRESSION: 1. No acute finding,  including infarct. 2. Moderate chronic microvascular disease. Electronically Signed   By: Marnee Spring M.D.   On: 01/09/2016 15:43   Ct Head Code Stroke Wo Contrast`  Result Date: 01/09/2016 CLINICAL DATA:  Code stroke. Stroke-like symptoms. Slurred speech and left-sided weakness EXAM: CT HEAD WITHOUT CONTRAST TECHNIQUE: Contiguous axial images were obtained from the base of the skull through the vertex without intravenous contrast. COMPARISON:  None. FINDINGS: Brain: Blurring of gray-white differentiation in the posterior left cerebral hemisphere, parietal region. No acute hemorrhage, hydrocephalus, or mass. Generalized atrophy. Chronic  white matter ischemic changes, mild for age. Vascular: Atherosclerotic calcification.  No hyperdense vessel. Skull: No acute or aggressive finding Sinuses/Orbits: Left cataract resection. Other: These results were sent by text page at the time of interpretation on 01/09/2016 at 1:10 pm to Dr. Roxy Mannsster, with pending call back ASPECTS [facility]([place] Stroke Program Early CT Score) - Ganglionic level infarction (caudate, lentiform nuclei, internal capsule, insula, M1-M3 cortex): 6 - Supraganglionic infarction (M4-M6 cortex): 3 Total score (0-10 with 10 being normal): 9 IMPRESSION: 1. Negative for acute hemorrhage. 2. Questionable loss of gray-white differentiation in the left parietal region. ASPECTS is 9. Electronically Signed   By: Marnee SpringJonathon  Watts M.D.   On: 01/09/2016 13:12    EKG: Independently reviewed. A. fib with normal rate. No acute ST changes.  Assessment/Plan: Principal Problem:   TIA (transient ischemic attack) Active Problems:   Dyslipidemia   Essential hypertension   Atrial fibrillation (HCC)   Chronic diastolic CHF (congestive heart failure) (HCC)    This patient was discussed with the ED physician, including pertinent vitals, physical exam findings, labs, and imaging.  We also discussed care given by the ED provider.  #1 TIA  Observation on  telemetry  Echocardiogram and ultrasound of neck  Lipid panel the morning  Hg A1c  Permissive hypertension #2 A. Fib  Chads 2 Vasc of 4  Continue Eliquis #3 essential hypertension  Hold metoprolol #4 chronic diastolic heart failure  Compensated #5 dyslipidemia  Continue to lipid therapy  DVT prophylaxis: Eliquis Consultants: Neurology Code Status: Full code Family Communication: Granddaughter in the room and talked with daughter on the phone  Disposition Plan: Patient likely to return home following admission   Levie HeritageJacob J Stinson, DO [facility] Pager [phone]  If 7PM-7AM, please contact night-coverage [email or web address] Password [email or web address]

## 2016-01-09 NOTE — ED Notes (Addendum)
Pt here for stroke-like symptoms- dizziness, left sided weakness, and left sided facial droop. Pt a&ox3. NIH was a 3. Pt passed swallow screen. Pt is a assist x1. Pt has 20G in right wrist. VSS.

## 2016-01-09 NOTE — ED Provider Notes (Signed)
Emergency Department Provider Note   I have reviewed the triage vital signs and the nursing notes.   HISTORY  Chief Complaint Code Stroke   HPI Corey Mcdonald is a 82 y.o. male with PMH of AAA, a-fib, CAD, HTN, HLD, and PAD presents emergency department for evaluation of acute onset vertigo and left-sided weakness. Patient states that he stood up and felt very lightheaded like he was going to pass out. He states in assisted living and staff called EMS. On their arrival they appreciated some left sided weakness in the arm and leg. At this time the patient endorses been feeling heavy. Shortly after he developed a vertigo. Patient's last known normal was 11:30 AM.   The patient denies any headache, weakness, difficult speaking, numbness, tingling. Further denies chest pain or difficulty breathing.   Past Medical History:  Diagnosis Date  . AAA (abdominal aortic aneurysm) (HCC)   . ABDOMINAL AORTIC ANEURYSM   . Anemia January 2012   admitted with acute MI March 15, 2010 and has significant acute blood loss anemia requiring transfusions of two units of packed RBCs.  Status post upper endoscopy March 22, 2010 without High-Risk bleeding lesion.  History of peptic ulcer disease  . Atrial fibrillation (HCC)   . BENIGN PROSTATIC HYPERTROPHY, HX OF    . COLONIC POLYPS, HX OF   . CORONARY ARTERY DISEASE    non-ST segment MI, March 15, 2010  . History of Doppler ultrasound 05/2011   h/o claudication, stable ABIs  . History of nuclear stress test 2007   persantine; normal, low risk   . HYPERLIPIDEMIA   . HYPERTENSION   . MACULAR DEGENERATION   . Myocardial infarction 02/2010  . PAD (peripheral artery disease) (HCC)   . PEPTIC ULCER DISEASE, HX OF   . PVD (peripheral vascular disease) (HCC)   . RESTLESS LEGS SYNDROME   . SLEEP APNEA   . VERTIGO     Patient Active Problem List   Diagnosis Date Noted  . TIA (transient ischemic attack) 01/09/2016  . Acute on chronic renal failure  (HCC)   . CAD in native artery   . Chronic atrial fibrillation (HCC)   . Chronic diastolic CHF (congestive heart failure) (HCC)   . Acute renal failure (HCC)   . UGI bleed 02/02/2015  . Melena 01/27/2015  . GI bleeding 01/27/2015  . Acute blood loss anemia 01/27/2015  . Chronic kidney disease 01/27/2015  . Pedal edema 01/21/2015  . DOE (dyspnea on exertion) 12/19/2014  . Unintended weight loss 10/11/2013  . Unexplained night sweats 10/11/2013  . Dehydration 01/04/2013  . Claudication (HCC) 09/28/2012  . S/P CABG x 4 09/28/2012  . Chest pain, atypical 05/10/2011  . Dyslipidemia 12/04/2009  . RESTLESS LEGS SYNDROME 12/04/2009  . S/P AAA repair 09/15/2008  . VERTIGO 07/28/2008  . WEAKNESS 06/23/2008  . Atrial fibrillation (HCC) 10/10/2006  . MACULAR DEGENERATION 08/18/2006  . Essential hypertension 08/18/2006  . Coronary atherosclerosis 08/18/2006  . ATRIAL FLUTTER 08/18/2006  . OSA on CPAP 08/18/2006  . PEPTIC ULCER DISEASE, HX OF 08/18/2006  . COLONIC POLYPS, HX OF 08/18/2006  . BPH (benign prostatic hyperplasia) 08/18/2006    Past Surgical History:  Procedure Laterality Date  . ABDOMINAL AORTIC ANEURYSM REPAIR     stenting   . CARDIAC CATHETERIZATION  02/2010   r/t NSTEMI; loss of SVG to RCA  . COLONOSCOPY WITH PROPOFOL N/A 02/04/2015   Procedure: COLONOSCOPY WITH PROPOFOL;  Surgeon: Charlott Rakes, MD;  Location: [facility] ENDOSCOPY;  Service: Endoscopy;  Laterality: N/A;  . CORONARY ARTERY BYPASS GRAFT  12/1995   x5; LIMA to ALD, vein graft to RCA (now occluded),SVG to OM, SVG to diagonal  . ESOPHAGOGASTRODUODENOSCOPY N/A 01/28/2015   Procedure: ESOPHAGOGASTRODUODENOSCOPY (EGD);  Surgeon: Vida RiggerMarc Magod, MD;  Location[facility] ENDOSCOPY;  Service: Endoscopy;  Laterality: N/A;  . INGUINAL HERNIA REPAIR     Left inguinal  . TRANSTHORACIC ECHOCARDIOGRAM  04/2011   EF 55-60%; mild LVH; grade 1 diastolic dysfunction      Allergies Cilostazol and Doxazosin  Family History    Problem Relation Age of Onset  . Heart disease Mother   . Heart disease Father   . Stroke Father   . Heart disease Sister   . Macular degeneration Sister   . Heart disease Sister   . Stroke Brother   . Hypertension Sister   . Heart Problems Sister     x2    Social History Social History  Substance Use Topics  . Smoking status: Former Smoker    Packs/day: 1.00    Years: 40.00    Types: Cigarettes    Quit date: 02/22/1987  . Smokeless tobacco: Never Used  . Alcohol use No    Review of Systems  Constitutional: No fever/chills Eyes: No visual changes. ENT: No sore throat. Cardiovascular: Denies chest pain. Respiratory: Denies shortness of breath. Gastrointestinal: No abdominal pain.  No nausea, no vomiting.  No diarrhea.  No constipation. Genitourinary: Negative for dysuria. Musculoskeletal: Negative for back pain. Skin: Negative for rash. Neurological: Negative for headaches. Positive vertigo and left sided heaviness.   10-point ROS otherwise negative.  ____________________________________________   PHYSICAL EXAM:  VITAL SIGNS: ED Triage Vitals  Enc Vitals Group     BP 01/09/16 1320 188/98     Pulse Rate 01/09/16 1318 93     Resp 01/09/16 1318 26     Temp 01/09/16 1320 97.5 F (36.4 C)     Temp Source 01/09/16 1320 Oral     SpO2 01/09/16 1320 100 %     Weight 01/09/16 1315 204 lb (92.5 kg)     Height 01/09/16 1315 5\' 11"  (1.803 m)     Pain Score 01/09/16 1315 0   Constitutional: Alert and oriented. Well appearing and in no acute distress. Eyes: Conjunctivae are normal. Baseline vision deficits.  Head: Atraumatic. Nose: No congestion/rhinnorhea. Mouth/Throat: Mucous membranes are moist.  Oropharynx non-erythematous. Neck: No stridor.  Cardiovascular: Normal rate, regular rhythm. Good peripheral circulation. Grossly normal heart sounds.   Respiratory: Normal respiratory effort.  No retractions. Lungs CTAB. Gastrointestinal: Soft and nontender. No  distention.  Musculoskeletal: No lower extremity tenderness nor edema. No gross deformities of extremities. Neurologic:  Normal speech and language. No gross focal neurologic deficits are appreciated.  Skin:  Skin is warm, dry and intact. No rash noted.   ____________________________________________   LABS (all labs ordered are listed, but only abnormal results are displayed)  Labs Reviewed  PROTIME-INR - Abnormal; Notable for the following:       Result Value   Prothrombin Time 16.3 (*)    All other components within normal limits  COMPREHENSIVE METABOLIC PANEL - Abnormal; Notable for the following:    CO2 20 (*)    Glucose, Bld 117 (*)    Creatinine, Ser 1.26 (*)    GFR calc non Af Amer 48 (*)    GFR calc Af Amer 56 (*)    All other components within normal limits  CBG MONITORING, ED - Abnormal; Notable  for the following:    Glucose-Capillary 128 (*)    All other components within normal limits  I-STAT CHEM 8, ED - Abnormal; Notable for the following:    BUN 28 (*)    Glucose, Bld 118 (*)    Calcium, Ion 1.07 (*)    All other components within normal limits  APTT  CBC  DIFFERENTIAL  HEMOGLOBIN A1C  LIPID PANEL  I-STAT TROPOININ, ED   ____________________________________________  EKG   EKG Interpretation  Date/Time:  Saturday January 09 2016 13:18:19 EST Ventricular Rate:  91 PR Interval:    QRS Duration: 119 QT Interval:  408 QTC Calculation: 502 R Axis:   58 Text Interpretation:  Atrial fibrillation Nonspecific intraventricular conduction delay Borderline ST depression, diffuse leads Baseline wander in lead(s) I III No STEMI.  Similar to prior.  Confirmed by Jeanclaude Wentworth MD, Kanaya Gunnarson [phone]) on 01/09/2016 2:08:11 PM       ____________________________________________  RADIOLOGY  Mr Brain Wo Contrast  Result Date: 01/09/2016 CLINICAL DATA:  Left arm weakness. EXAM: MRI HEAD WITHOUT CONTRAST TECHNIQUE: Multiplanar, multiecho pulse sequences of the brain and  surrounding structures were obtained without intravenous contrast. COMPARISON:  Head CT from earlier today FINDINGS: Brain: No acute infarction, hemorrhage, hydrocephalus, extra-axial collection or mass lesion. Moderate cerebral white matter disease consistent with chronic microvascular ischemia. Generalized cerebral volume loss with ventriculomegaly, commonly seen by this age. Vascular: Preserved flow voids. Skull and upper cervical spine: Negative Sinuses/Orbits: Left cataract resection.  No acute finding. IMPRESSION: 1. No acute finding, including infarct. 2. Moderate chronic microvascular disease. Electronically Signed   By: Marnee Spring M.D.   On: 01/09/2016 15:43   Ct Head Code Stroke Wo Contrast`  Result Date: 01/09/2016 CLINICAL DATA:  Code stroke. Stroke-like symptoms. Slurred speech and left-sided weakness EXAM: CT HEAD WITHOUT CONTRAST TECHNIQUE: Contiguous axial images were obtained from the base of the skull through the vertex without intravenous contrast. COMPARISON:  None. FINDINGS: Brain: Blurring of gray-white differentiation in the posterior left cerebral hemisphere, parietal region. No acute hemorrhage, hydrocephalus, or mass. Generalized atrophy. Chronic white matter ischemic changes, mild for age. Vascular: Atherosclerotic calcification.  No hyperdense vessel. Skull: No acute or aggressive finding Sinuses/Orbits: Left cataract resection. Other: These results were sent by text page at the time of interpretation on 01/09/2016 at 1:10 pm to Dr. Roxy Manns, with pending call back ASPECTS [facility] Stroke Program Early CT Score) - Ganglionic level infarction (caudate, lentiform nuclei, internal capsule, insula, M1-M3 cortex): 6 - Supraganglionic infarction (M4-M6 cortex): 3 Total score (0-10 with 10 being normal): 9 IMPRESSION: 1. Negative for acute hemorrhage. 2. Questionable loss of gray-white differentiation in the left parietal region. ASPECTS is 9. Electronically Signed   By: Marnee Spring  M.D.   On: 01/09/2016 13:12    ____________________________________________   PROCEDURES  Procedure(s) performed:   Procedures  None ____________________________________________   INITIAL IMPRESSION / ASSESSMENT AND PLAN / ED COURSE  Pertinent labs & imaging results that were available during my care of the patient were reviewed by me and considered in my medical decision making (see chart for details).  Code stroke activated by EMS. Last known normal 11:30 AM. Patient has an NIH stroke scale of 3 on arrival. tPA not given for improving and changing symptoms. Question whether the patient is on Eliquis with conflicting information in the chart and paperwork on EMS arrival. Neurology evaluated the patient and has placed recommendations. Appreciate their consultation on this case. Plan to discuss with Hospitlaist.   Discussed  patient's case with hosptialist, Dr. Adrian BlackwaterStinson. Care transferred to hospitalist service.  I reviewed all nursing notes, vitals, pertinent old records, EKGs, labs, imaging (as available).  ____________________________________________  FINAL CLINICAL IMPRESSION(S) / ED DIAGNOSES  Final diagnoses:  Left arm weakness  Cerebrovascular accident (CVA), unspecified mechanism (HCC)     MEDICATIONS GIVEN DURING THIS VISIT:  Medications   stroke: mapping our early stages of recovery book (not administered)  pantoprazole (PROTONIX) EC tablet 40 mg (not administered)  atorvastatin (LIPITOR) tablet 20 mg (not administered)  ferrous gluconate (FERGON) tablet 324 mg (not administered)  apixaban (ELIQUIS) tablet 5 mg (not administered)  tamsulosin (FLOMAX) capsule 0.4 mg (not administered)  docusate sodium (COLACE) capsule 200 mg (not administered)  ondansetron (ZOFRAN) injection 4 mg (4 mg Intravenous Given 01/09/16 1357)     NEW OUTPATIENT MEDICATIONS STARTED DURING THIS VISIT:  None   Note:  This document was prepared using Dragon voice recognition software  and may include unintentional dictation errors.  Alona BeneJoshua Genie Wenke, MD Emergency Medicine  Maia PlanJoshua G Jovannie Ulibarri, MD 01/09/16 [phone]

## 2016-01-09 NOTE — ED Notes (Signed)
PT CBG was 128mg . Nurse notified

## 2016-01-09 NOTE — ED Triage Notes (Signed)
Pt brought in by EMS due to having stroke like symptoms. Pt last seen normal at 11:30 today. Pt presented with left sided weakness, dizziness, and left sided facial droop. Pt is a&ox3.

## 2016-01-09 NOTE — Consult Note (Signed)
Neurology Consult Note  Reason for Consultation: CODE STROKE   Requesting provider: Activated by EMS; ED MD Alona Bene  CC: "I got dizzy when I stood up to go to lunch"  HPI: This is a 89-yo RH man who is brought to the ED by EMS as a CODE STROKE. History is obtained directly from the patient. I have reviewed available records as well.   The patient reports that when he stood up to go to lunch this afternoon he became dizzy and felt as if he was going to pass out. He was able to make it to his bed to lay down and says he never lost consciousness. He denies any headache, weakness, numbness, tingling, difficulty speaking. He is blind at baseline.   EMS arrived and reported dizziness, slurred speech, and numbness/weakness of the left arm which prompted them to activate CODE STROKE. He was reportedly last seen normal at 1130 and CODE STROKE was activated at 1223. In the ED he was taken for an emergent CT of the head which was interpreted as possibly showing some changes in the left parietal lobe. NIHSS score was 3 (2 points for facial palsy, 1 point for left arm drift). He complained of dizziness and nausea in the ED which has since resolved.   On my assessment, he has slightly asymmetric smile on the R with proximal weakness in BLE.    Last known well: 1130 NIHSS score: 3 tPA given?: No, improving and changing symptoms; possible acute changes reported on [facility]; also on Eliquis per pulmonologist note from 01/07/17  PMH:  Past Medical History:  Diagnosis Date  . AAA (abdominal aortic aneurysm) (HCC)   . ABDOMINAL AORTIC ANEURYSM   . Anemia January 2012   admitted with acute MI March 15, 2010 and has significant acute blood loss anemia requiring transfusions of two units of packed RBCs.  Status post upper endoscopy March 22, 2010 without High-Risk bleeding lesion.  History of peptic ulcer disease  . Atrial fibrillation (HCC)   . BENIGN PROSTATIC HYPERTROPHY, HX OF    . COLONIC POLYPS, HX OF    . CORONARY ARTERY DISEASE    non-ST segment MI, March 15, 2010  . History of Doppler ultrasound 05/2011   h/o claudication, stable ABIs  . History of nuclear stress test 2007   persantine; normal, low risk   . HYPERLIPIDEMIA   . HYPERTENSION   . MACULAR DEGENERATION   . Myocardial infarction 02/2010  . PAD (peripheral artery disease) (HCC)   . PEPTIC ULCER DISEASE, HX OF   . PVD (peripheral vascular disease) (HCC)   . RESTLESS LEGS SYNDROME   . SLEEP APNEA   . VERTIGO     PSH:  Past Surgical History:  Procedure Laterality Date  . ABDOMINAL AORTIC ANEURYSM REPAIR     stenting   . CARDIAC CATHETERIZATION  02/2010   r/t NSTEMI; loss of SVG to RCA  . COLONOSCOPY WITH PROPOFOL N/A 02/04/2015   Procedure: COLONOSCOPY WITH PROPOFOL;  Surgeon: Charlott Rakes, MD;  Location: [facility] ENDOSCOPY;  Service: Endoscopy;  Laterality: N/A;  . CORONARY ARTERY BYPASS GRAFT  12/1995   x5; LIMA to ALD, vein graft to RCA (now occluded),SVG to OM, SVG to diagonal  . ESOPHAGOGASTRODUODENOSCOPY N/A 01/28/2015   Procedure: ESOPHAGOGASTRODUODENOSCOPY (EGD);  Surgeon: Vida Rigger, MD;  Location: [facility] ENDOSCOPY;  Service: Endoscopy;  Laterality: N/A;  . INGUINAL HERNIA REPAIR     Left inguinal  . TRANSTHORACIC ECHOCARDIOGRAM  04/2011   EF 55-60%; mild LVH;  grade 1 diastolic dysfunction    Family history: Family History  Problem Relation Age of Onset  . Heart disease Mother   . Heart disease Father   . Stroke Father   . Heart disease Sister   . Macular degeneration Sister   . Heart disease Sister   . Stroke Brother   . Hypertension Sister   . Heart Problems Sister     x2    Social history:  Social History   Social History  . Marital status: Married    Spouse name: N/A  . Number of children: 2  . Years of education: N/A   Occupational History  . Not on file.   Social History Main Topics  . Smoking status: Former Smoker    Packs/day: 1.00    Years: 40.00    Types: Cigarettes    Quit  date: 02/22/1987  . Smokeless tobacco: Never Used  . Alcohol use No  . Drug use: No  . Sexual activity: Not on file   Other Topics Concern  . Not on file   Social History Narrative  . No narrative on file    Current inpatient meds:  Current Facility-Administered Medications  Medication Dose Route Frequency Provider Last Rate Last Dose  . ondansetron (ZOFRAN) injection 4 mg  4 mg Intravenous Once Maia PlanJoshua G Long, MD       Current Outpatient Prescriptions  Medication Sig Dispense Refill  . acetaminophen (TYLENOL) 325 MG tablet Take 650 mg by mouth every 6 (six) hours as needed for moderate pain.     Marland Kitchen. atorvastatin (LIPITOR) 20 MG tablet TAKE ONE TABLET BY MOUTH ONCE DAILY 90 tablet 1  . docusate sodium (COLACE) 100 MG capsule Take 250 mg by mouth at bedtime.     Marland Kitchen. ELIQUIS 5 MG TABS tablet TAKE ONE TABLET BY MOUTH TWICE DAILY 60 tablet 3  . Ferrous Gluconate (IRON 27 PO) Take by mouth.    . furosemide (LASIX) 20 MG tablet Take 1 tablet (20 mg total) by mouth daily. (Patient not taking: Reported on 01/08/2016) 5 tablet 0  . metoprolol tartrate (LOPRESSOR) 25 MG tablet Take 25 mg by mouth 2 (two) times daily.    . Multiple Vitamin (MULTIVITAMIN) tablet Take 1 tablet by mouth daily.     . nitroGLYCERIN (NITROSTAT) 0.4 MG SL tablet Place 1 tablet (0.4 mg total) under the tongue every 5 (five) minutes as needed. FOR CHEST PAIN 25 tablet 6  . pantoprazole (PROTONIX) 40 MG tablet TAKE ONE TABLET BY MOUTH ONCE DAILY 60 tablet 2  . tamsulosin (FLOMAX) 0.4 MG CAPS capsule TAKE ONE CAPSULE BY MOUTH ONCE DAILY IN THE EVENING 30 capsule 2    Allergies: Allergies  Allergen Reactions  . Cilostazol Other (See Comments)    Adverse reaction per patient - leg cramps  . Doxazosin Other (See Comments)    INTOLERANCE -  leg cramps     ROS: As per HPI. A full 14-point review of systems was performed and is otherwise notable for some dry mouth and some shortness of breath. The remainder is unremarkable.    PE:  BP 188/98 (BP Location: Right Arm)   Pulse 95   Temp 97.5 F (36.4 C) (Oral)   Resp (!) 29   Ht 5\' 11"  (1.803 m)   Wt 92.5 kg (204 lb)   SpO2 100%   BMI 28.45 kg/m   General: WD obese elderly man lying on ED gurney. He is legally blind and hard of hearing. He  is in no acute distress. He is alert and oriented to all but the month and date. Speech clear, slight dysarthria. No aphasia. Follows commands briskly. Affect is flat.  HEENT: Normocephalic. Neck supple without LAD. MM slightly dry, OP clear. Dentition good. Sclerae anicteric. No conjunctival injection.  CV: Irregularly irregular, no murmur. Carotid pulses full and symmetric, no bruits. Distal pulses 2+ and symmetric.  Lungs: CTAB.  Abdomen: Soft, obese, non-distended, non-tender. Bowel sounds present x4.  Extremities: No C/C. He has edema in BLE, R>L.  Neuro:  CN: Pupils are equal and round. They are symmetrically reactive from 2-->1 mm. He is legally blind. Volitional saccades are intact. No nystagmus. Facial sensation is intact to light touch. The right side of the mouth is slightly asymmetric. Hearing is diminished to conversational voice. Palate elevates symmetrically and uvula is midline. Voice is normal in tone, pitch and quality. Bilateral SCM and trapezii are 5/5. Tongue is midline with normal bulk and mobility.  Motor: Normal bulk, tone, and strength with the exception of mild proximal weakness in BLE. No tremor or other abnormal movements. No drift.  Sensation: Intact to light touch.  DTRs: 2+, symmetric with the exception of absent ankle jerks. Toes upgoing bilaterally.  Coordination: Finger-to-nose is limited by poor vision. Heel-to-shin is without dysmetria.   Labs:  Lab Results  Component Value Date   WBC 9.7 01/09/2016   HGB 14.3 01/09/2016   HCT 42.0 01/09/2016   PLT 255 01/09/2016   GLUCOSE 118 (H) 01/09/2016   CHOL  08/02/2008    128        ATP III CLASSIFICATION:  <200     mg/dL   Desirable   161-096  mg/dL   Borderline High  >=045    mg/dL   High          TRIG 79 08/02/2008   HDL 34 (L) 08/02/2008   LDLCALC  08/02/2008    78        Total Cholesterol/HDL:CHD Risk Coronary Heart Disease Risk Table                     Men   Women  1/2 Average Risk   3.4   3.3  Average Risk       5.0   4.4  2 X Average Risk   9.6   7.1  3 X Average Risk  23.4   11.0        Use the calculated Patient Ratio above and the CHD Risk Table to determine the patient's CHD Risk.        ATP III CLASSIFICATION (LDL):  <100     mg/dL   Optimal  409-811  mg/dL   Near or Above                    Optimal  130-159  mg/dL   Borderline  914-782  mg/dL   High  >956     mg/dL   Very High   ALT 17 21/30/8657   AST 18 02/05/2015   NA 137 01/09/2016   K 4.5 01/09/2016   CL 105 01/09/2016   CREATININE 1.20 01/09/2016   BUN 28 (H) 01/09/2016   CO2 26 05/08/2015   TSH 2.150 12/26/2014   PSA 2.74 06/13/2006   INR 1.30 01/09/2016    Imaging:  I have personally and independently reviewed the [facility] without contrast from today. This shows moderate diffuse generalized atrophy. There is mild to moderate small vessel  disease in the bihemispheric white matter. There is patchy hypodensity in the left parietal lobe suggestive of ischemia of uncertain age but appears subacute to chronic. No obvious acute abnormality is seen.    Assessment and Plan:  1. Acute ischemic stroke: Versus TIA. Symptomology is somewhat inconsistent, however. Known risk factors for cerebrovascular disease in this patient include afib, HTN, hyperlipidemia, OSA, CAD, and age. Additional workup will be ordered to include MRI brain, TTE, fasting lipids, and hemoglobin a1c. Further testing will be determined by results from these initial studies. Recommend continued anticoagulation with Eliquis.  Continue statin with goal LDL less than 70. Ensure adequate glucose control. Allow permissive hypertension in the acute phase, treating only SBP greater  than 220 mmHg and/or DBP greater than 110 mmHg. Avoid fever and hyperglycemia as these can extend the infarct. Avoid hypotonic IVF to minimize exacerbation of post-stroke edema. Initiate rehab services. DVT prophylaxis as needed.  2. LUE weakness: This was reported initially but is not apparent on my exam. Follow, OT as needed.   3. R facial weakness: This is mild, chronicity uncertain. No specific intervention.   4. BLE proximal weakness: This is most suggestive of mild deconditioning. PT.   This was discussed with the patient and he is in agreement with the plan as noted. He was given the opportunity to ask any questions and these were addressed to his satisfaction.

## 2016-01-10 ENCOUNTER — Encounter (HOSPITAL_COMMUNITY): Payer: Medicare Other

## 2016-01-10 DIAGNOSIS — I10 Essential (primary) hypertension: Secondary | ICD-10-CM | POA: Diagnosis not present

## 2016-01-10 DIAGNOSIS — G459 Transient cerebral ischemic attack, unspecified: Secondary | ICD-10-CM | POA: Diagnosis not present

## 2016-01-10 DIAGNOSIS — I481 Persistent atrial fibrillation: Secondary | ICD-10-CM | POA: Diagnosis not present

## 2016-01-10 DIAGNOSIS — G458 Other transient cerebral ischemic attacks and related syndromes: Secondary | ICD-10-CM

## 2016-01-10 LAB — LIPID PANEL
Cholesterol: 134 mg/dL (ref 0–200)
HDL: 40 mg/dL — AB (ref >40)
LDL CALC: 71 mg/dL (ref 0–99)
Total CHOL/HDL Ratio: 3.4 RATIO
Triglycerides: 116 mg/dL (ref <150)
VLDL: 23 mg/dL (ref 0–40)

## 2016-01-10 LAB — GLUCOSE, CAPILLARY
GLUCOSE-CAPILLARY: 94 mg/dL (ref 65–99)
GLUCOSE-CAPILLARY: 81 mg/dL (ref 65–99)
Glucose-Capillary: 123 mg/dL — ABNORMAL HIGH (ref 65–99)
Glucose-Capillary: 81 mg/dL (ref 65–99)

## 2016-01-10 NOTE — Progress Notes (Signed)
PROGRESS NOTE    Corey CaroliCarl Mcdonald  ZOX:[id number] DOB: [birth year] DOA: 01/09/2016 PCP: [name],[name], MD   Brief Narrative: 82 y.o. male with a history of AAA, anemia, hyperlipidemia, CAD with NSTEMI in 03/15/10, hyperlipidemia, macular degeneration, hypertension, PAD presented with left sided weakness. Code stroke was called in [place]. Neurology following.  Assessment & Plan:  # TIA (transient ischemic attack): Patient presented with left upper extremity weakness and right facial droop. -CT scan and MRI of brain showed no acute finding. -Pending echocardiogram, carotid Doppler -LDL 71, A1c pending -Continue Lipitor, on eliquis -PT, OT and social worker evaluation for safe discharge.  # Dyslipidemia: on statin  # Essential hypertension: permissive hypertension. Monitor BP  #  Atrial fibrillation (HCC); on eliquis, metoprolol on hold now. May need to resume by tomorrow. Monitor HR.    #Chronic diastolic CHF (congestive heart failure) (HCC): stable  # CKD stage III: stable creatinine. Avoid nephrotoxins.  DVT prophylaxis:on eliquis for systemic AC Code Status:full Family Communication:no Disposition Plan: Likely discharge to SNF in 1-2 days.  Consultants:   Neurology  Procedures: CT head, MRI brain Antimicrobials: None  Subjective: Patient was seen and examined at bedside. Patient was alert awake and oriented to [facility], November and name. Denied headache, dizziness, nausea, vomiting, chest pain or shortness of breath.   Objective: Vitals:   01/10/16 0430 01/10/16 0620 01/10/16 0914 01/10/16 1410  BP: (!) 147/84 (!) 159/80 117/61 (!) 147/77  Pulse:   85 96  Resp: 18 18 20 20   Temp:   97.8 F (36.6 C) 97.7 F (36.5 C)  TempSrc:   Oral Oral  SpO2: 95% 99% 95% 99%  Weight:      Height:        Intake/Output Summary (Last 24 hours) at 01/10/16 1412 Last data filed at 01/10/16 1300  Gross per 24 hour  Intake             1220 ml  Output              850 ml    Net              370 ml   Filed Weights   01/09/16 1315  Weight: 92.5 kg (204 lb)    Examination:  General exam: Elderly male, not in distress,appears calm and comfortable  PERLA, right sided facial droop mild Respiratory system: Clear to auscultation. Respiratory effort normal. No wheezing or crackle Cardiovascular system: S1 & S2 heard, RRR.  No pedal edema. Gastrointestinal system: Abdomen is nondistended, soft and nontender. Normal bowel sounds heard. Central nervous system: Alert and oriented. No focal neurological deficits. Extremities: Symmetric 5 x 5 power. Skin: No rashes, lesions or ulcers Psychiatry: Judgement and insight appear normal. Mood & affect appropriate.     Data Reviewed: I have personally reviewed following labs and imaging studies  CBC:  Recent Labs Lab 01/09/16 1250 01/09/16 1256  WBC 9.7  --   NEUTROABS 7.1  --   HGB 13.7 14.3  HCT 40.5 42.0  MCV 89.4  --   PLT 255  --    Basic Metabolic Panel:  Recent Labs Lab 01/09/16 1250 01/09/16 1256  NA 137 137  K 3.9 4.5  CL 105 105  CO2 20*  --   GLUCOSE 117* 118*  BUN 20 28*  CREATININE 1.26* 1.20  CALCIUM 9.9  --    GFR: Estimated Creatinine Clearance: 47.6 mL/min (by C-G formula based on SCr of 1.2 mg/dL). Liver Function Tests:  Recent  Labs Lab 01/09/16 1250  AST 23  ALT 23  ALKPHOS 61  BILITOT 0.8  PROT 7.6  ALBUMIN 3.9   No results for input(s): LIPASE, AMYLASE in the last 168 hours. No results for input(s): AMMONIA in the last 168 hours. Coagulation Profile:  Recent Labs Lab 01/09/16 1250  INR 1.30   Cardiac Enzymes: No results for input(s): CKTOTAL, CKMB, CKMBINDEX, TROPONINI in the last 168 hours. BNP (last 3 results) No results for input(s): PROBNP in the last 8760 hours. HbA1C: No results for input(s): HGBA1C in the last 72 hours. CBG:  Recent Labs Lab 01/09/16 1407 01/09/16 2108 01/10/16 0644 01/10/16 1151  GLUCAP 128* 151* 94 123*   Lipid  Profile:  Recent Labs  01/10/16 0554  CHOL 134  HDL 40*  LDLCALC 71  TRIG 401116  CHOLHDL 3.4   Thyroid Function Tests: No results for input(s): TSH, T4TOTAL, FREET4, T3FREE, THYROIDAB in the last 72 hours. Anemia Panel: No results for input(s): VITAMINB12, FOLATE, FERRITIN, TIBC, IRON, RETICCTPCT in the last 72 hours. Sepsis Labs: No results for input(s): PROCALCITON, LATICACIDVEN in the last 168 hours.  No results found for this or any previous visit (from the past 240 hour(s)).       Radiology Studies: Mr Brain Wo Contrast  Result Date: 01/09/2016 CLINICAL DATA:  Left arm weakness. EXAM: MRI HEAD WITHOUT CONTRAST TECHNIQUE: Multiplanar, multiecho pulse sequences of the brain and surrounding structures were obtained without intravenous contrast. COMPARISON:  Head CT from earlier today FINDINGS: Brain: No acute infarction, hemorrhage, hydrocephalus, extra-axial collection or mass lesion. Moderate cerebral white matter disease consistent with chronic microvascular ischemia. Generalized cerebral volume loss with ventriculomegaly, commonly seen by this age. Vascular: Preserved flow voids. Skull and upper cervical spine: Negative Sinuses/Orbits: Left cataract resection.  No acute finding. IMPRESSION: 1. No acute finding, including infarct. 2. Moderate chronic microvascular disease. Electronically Signed   By: Marnee SpringJonathon  Watts M.D.   On: 01/09/2016 15:43   Ct Head Code Stroke Wo Contrast`  Result Date: 01/09/2016 CLINICAL DATA:  Code stroke. Stroke-like symptoms. Slurred speech and left-sided weakness EXAM: CT HEAD WITHOUT CONTRAST TECHNIQUE: Contiguous axial images were obtained from the base of the skull through the vertex without intravenous contrast. COMPARISON:  None. FINDINGS: Brain: Blurring of gray-white differentiation in the posterior left cerebral hemisphere, parietal region. No acute hemorrhage, hydrocephalus, or mass. Generalized atrophy. Chronic white matter ischemic changes,  mild for age. Vascular: Atherosclerotic calcification.  No hyperdense vessel. Skull: No acute or aggressive finding Sinuses/Orbits: Left cataract resection. Other: These results were sent by text page at the time of interpretation on 01/09/2016 at 1:10 pm to Dr. Roxy Mannsster, with pending call back ASPECTS [facility]([place] Stroke Program Early CT Score) - Ganglionic level infarction (caudate, lentiform nuclei, internal capsule, insula, M1-M3 cortex): 6 - Supraganglionic infarction (M4-M6 cortex): 3 Total score (0-10 with 10 being normal): 9 IMPRESSION: 1. Negative for acute hemorrhage. 2. Questionable loss of gray-white differentiation in the left parietal region. ASPECTS is 9. Electronically Signed   By: Marnee SpringJonathon  Watts M.D.   On: 01/09/2016 13:12        Scheduled Meds: .  stroke: mapping our early stages of recovery book   Does not apply Once  . apixaban  5 mg Oral BID  . atorvastatin  20 mg Oral Daily  . docusate sodium  200 mg Oral QHS  . ferrous gluconate  324 mg Oral BID WC  . pantoprazole  40 mg Oral Daily  . tamsulosin  0.4 mg Oral  QPC supper   Continuous Infusions:   LOS: 0 days   Teofilo Lupinacci Jaynie Collins, MD [facility] Pager [phone]  If 7PM-7AM, please contact night-coverage [email or web address] Password [email or web address] 01/10/2016, 2:12 PM

## 2016-01-10 NOTE — Evaluation (Signed)
Physical Therapy Evaluation Patient Details Name: Corey CaroliCarl Mierzwa MRN: [id number] DOB: 11-15-1923 Today's Date: 01/10/2016   History of Present Illness  Patient is a 82 y/o male with hx of A-fib, CAd, CABG, PAD, AAA, HLD, HTN, macular degeneration, peptic ulcer disease presents with left sided weakness. Head CT, MRI-unremarkable.  Clinical Impression  Patient presents with mild balance deficits s/p above. Tolerated gait training and stair training with Min-guard -supervision for safety as pt has not been up in a 24 hours. HR ranged from 105-177 bpm during activity. Anticipate with increased activity, strength and mobility will improve. Pt independent PTA and walks 1 mile/day. Will do better in his home environment due to familiarity and vision impairments. Recommend ambulation multiple times per day with nursing. Pt does not require skilled therapy services as pt functioning close to baseline. Discharge from therapy.    Follow Up Recommendations No PT follow up;Supervision - Intermittent    Equipment Recommendations  None recommended by PT    Recommendations for Other Services       Precautions / Restrictions Precautions Precautions: Fall Precaution Comments: watch HR Restrictions Weight Bearing Restrictions: No      Mobility  Bed Mobility               General bed mobility comments: up in chair upon PT arrival.   Transfers Overall transfer level: Needs assistance Equipment used: None Transfers: Sit to/from Stand Sit to Stand: Supervision         General transfer comment: Supervision for safety. Reports wanting to fall foward sometimes when he stands so has to wait a few sec prior to walking.   Ambulation/Gait Ambulation/Gait assistance: Min guard Ambulation Distance (Feet): 200 Feet Assistive device: None Gait Pattern/deviations: Step-through pattern;Decreased stride length Gait velocity: decreased   General Gait Details: Slow, mostly steady gait with  directional cues due to impaired vision. HR up to 146 bpm during gait training.  Stairs Stairs: Yes Stairs assistance: Min guard Stair Management: Step to pattern;Alternating pattern;Two rails Number of Stairs: 13 General stair comments: Cues for technique and safety. HR up to 177 bpm on ascent as pt holding breath. mild dizziness post stair training which resolved.   Wheelchair Mobility    Modified Rankin (Stroke Patients Only) Modified Rankin (Stroke Patients Only) Pre-Morbid Rankin Score: Slight disability Modified Rankin: Slight disability     Balance Overall balance assessment: Needs assistance Sitting-balance support: Feet supported;No upper extremity supported Sitting balance-Leahy Scale: Good Sitting balance - Comments: Able to donn socks and shoes without difficulty.    Standing balance support: During functional activity Standing balance-Leahy Scale: Fair                               Pertinent Vitals/Pain Pain Assessment: No/denies pain    Home Living Family/patient expects to be discharged to:: Private residence (INdependent living facility- [facility]) Living Arrangements: Alone Available Help at Discharge: Family;Available PRN/intermittently Type of Home: Independent living facility Home Access: Level entry     Home Layout: One level Home Equipment: Walker - 2 wheels;Bedside commode;Grab bars - tub/shower;Hand held shower head;Cane - single point;Wheelchair - manual Additional Comments: pt legally blind but does everything on own. does have staff that cleans place every other week.    Prior Function Level of Independence: Independent         Comments: Walks to dining hall. Walks 1 mile per day.      Hand Dominance   Dominant Hand:  Right    Extremity/Trunk Assessment   Upper Extremity Assessment: Defer to OT evaluation           Lower Extremity Assessment: Generalized weakness;LLE deficits/detail (but tests 4+/5 throughout)          Communication   Communication: No difficulties  Cognition Arousal/Alertness: Awake/alert Behavior During Therapy: WFL for tasks assessed/performed Overall Cognitive Status: Within Functional Limits for tasks assessed                      General Comments General comments (skin integrity, edema, etc.): Daughter present during session.    Exercises     Assessment/Plan    PT Assessment Patent does not need any further PT services  PT Problem List            PT Treatment Interventions      PT Goals (Current goals can be found in the Care Plan section)  Acute Rehab PT Goals Patient Stated Goal: to not have to use a RW PT Goal Formulation: With patient Time For Goal Achievement: 01/24/16 Potential to Achieve Goals: Good    Frequency     Barriers to discharge        Co-evaluation PT/OT/SLP Co-Evaluation/Treatment: Yes Reason for Co-Treatment: For patient/therapist safety PT goals addressed during session: Mobility/safety with mobility;Strengthening/ROM;Balance         End of Session Equipment Utilized During Treatment: Gait belt Activity Tolerance: Patient tolerated treatment well;Treatment limited secondary to medical complications (Comment) (elevated HR) Patient left: in chair;with call bell/phone within reach;with chair alarm set;with family/visitor present Nurse Communication: Mobility status    Functional Assessment Tool Used: clinical judgment Functional Limitation: Mobility: Walking and moving around Mobility: Walking and Moving Around Current Status ([id number](G8978): At least 1 percent but less than 20 percent impaired, limited or restricted Mobility: Walking and Moving Around Goal Status [phone](G8979): At least 1 percent but less than 20 percent impaired, limited or restricted Mobility: Walking and Moving Around Discharge Status [phone](G8980): At least 1 percent but less than 20 percent impaired, limited or restricted    Time: [id number] PT Time Calculation  (min) (ACUTE ONLY): 35 min   Charges:   PT Evaluation $PT Eval Moderate Complexity: 1 Procedure     PT G Codes:   PT G-Codes **NOT FOR INPATIENT CLASS** Functional Assessment Tool Used: clinical judgment Functional Limitation: Mobility: Walking and moving around Mobility: Walking and Moving Around Current Status ([id number](G8978): At least 1 percent but less than 20 percent impaired, limited or restricted Mobility: Walking and Moving Around Goal Status [phone](G8979): At least 1 percent but less than 20 percent impaired, limited or restricted Mobility: Walking and Moving Around Discharge Status [phone]([id number]): At least 1 percent but less than 20 percent impaired, limited or restricted    Jenina Moening A Rhyan Wolters 01/10/2016, 2:58 PM Mylo RedShauna Keylor Rands, PT, DPT [phone]

## 2016-01-10 NOTE — Progress Notes (Signed)
Assumed care of patient from Susan RN. 

## 2016-01-10 NOTE — Progress Notes (Signed)
STROKE TEAM PROGRESS NOTE   HISTORY OF PRESENT ILLNESS (per record) This is a 82-yo RH man who is brought to the ED by EMS as a CODE STROKE. History is obtained directly from the patient. I have reviewed available records as well.   The patient reports that when he stood up to go to lunch this afternoon he became dizzy and felt as if he was going to pass out. He was able to make it to his bed to lay down and says he never lost consciousness. He denies any headache, weakness, numbness, tingling, difficulty speaking. He is blind at baseline.   EMS arrived and reported dizziness, slurred speech, and numbness/weakness of the left arm which prompted them to activate CODE STROKE. He was reportedly last seen normal at 1130 and CODE STROKE was activated at 1223. In the ED he was taken for an emergent CT of the head which was interpreted as possibly showing some changes in the left parietal lobe. NIHSS score was 3 (2 points for facial palsy, 1 point for left arm drift). He complained of dizziness and nausea in the ED which has since resolved.   On my assessment, he has slightly asymmetric smile on the R with proximal weakness in BLE.    Last known well: 1130 NIHSS score: 3 tPA given?: No, improving and changing symptoms; possible acute changes [facility]; also on Eliquis per pulmonologist note from 01/07/17   SUBJECTIVE (INTERVAL HISTORY) No family members present. The patient reports he is blind from macular degeneration. He has a history of atrial fibrillation and has been on Eliquis. He feels back to baseline. Dr. Pearlean BrownieSethi explained that the MRI did not show a stroke and that the patient probably had a TIA.   OBJECTIVE Temp:  [97.4 F (36.3 C)-97.6 F (36.4 C)] 97.5 F (36.4 C) (11/19 0200) Pulse Rate:  [32-95] 65 (11/19 0200) Cardiac Rhythm: Atrial fibrillation (11/18 1900) Resp:  [16-29] 18 (11/19 0620) BP: (118-188)/(67-110) 159/80 (11/19 0620) SpO2:  [87 %-100 %] 99 % (11/19  0620) FiO2 (%):  [21 %] 21 % (11/18 1401) Weight:  [92.5 kg (204 lb)] 92.5 kg (204 lb) (11/18 1315)  CBC:  Recent Labs Lab 01/09/16 1250 01/09/16 1256  WBC 9.7  --   NEUTROABS 7.1  --   HGB 13.7 14.3  HCT 40.5 42.0  MCV 89.4  --   PLT 255  --     Basic Metabolic Panel:  Recent Labs Lab 01/09/16 1250 01/09/16 1256  NA 137 137  K 3.9 4.5  CL 105 105  CO2 20*  --   GLUCOSE 117* 118*  BUN 20 28*  CREATININE 1.26* 1.20  CALCIUM 9.9  --     Lipid Panel:    Component Value Date/Time   CHOL 134 01/10/2016 0554   TRIG 116 01/10/2016 0554   HDL 40 (L) 01/10/2016 0554   CHOLHDL 3.4 01/10/2016 0554   VLDL 23 01/10/2016 0554   LDLCALC 71 01/10/2016 0554   HgbA1c: No results found for: HGBA1C Urine Drug Screen: No results found for: LABOPIA, COCAINSCRNUR, LABBENZ, AMPHETMU, THCU, LABBARB    IMAGING  Mr Brain Wo Contrast 01/09/2016 1. No acute finding, including infarct.  2. Moderate chronic microvascular disease.     Ct Head Code Stroke Wo Contrast` 01/09/2016 1. Negative for acute hemorrhage.  2. Questionable loss of gray-white differentiation in the left parietal region. ASPECTS is 9.     PHYSICAL EXAM General:  obese elderly Caucasian male  .  He is legally blind and hard of hearing. He is in no acute distress. He is alert and oriented to all but the month and date. Speech clear, slight dysarthria. No aphasia. Follows commands briskly. Affect is flat.  HEENT: Normocephalic. Neck supple without LAD. MM slightly dry, OP clear. Dentition good. Sclerae anicteric. No conjunctival injection.  CV: Irregularly irregular, no murmur. Carotid pulses full and symmetric, no bruits. Distal pulses 2+ and symmetric.  Lungs: CTAB.  Abdomen: Soft, obese, non-distended, non-tender. Bowel sounds present x4.  Extremities: No C/C. He has edema in BLE, R>L.  Neuro:  CN: Pupils are equal and round. They are symmetrically reactive from 2-->1 mm. He is legally blind. Volitional  saccades are intact. No nystagmus. Facial sensation is intact to light touch. The right side of the mouth is slightly asymmetric. Hearing is diminished to conversational voice. Palate elevates symmetrically and uvula is midline. Voice is normal in tone, pitch and quality. Bilateral SCM and trapezii are 5/5. Tongue is midline with normal bulk and mobility.  Motor: Normal bulk, tone, and strength with the exception of mild proximal weakness in BLE. No tremor or other abnormal movements. No drift.  Sensation: Intact to light touch.  DTRs: 2+, symmetric with the exception of absent ankle jerks. Toes upgoing bilaterally.  Coordination: Finger-to-nose is limited by poor vision. Heel-to-shin is without dysmetria.      ASSESSMENT/PLAN Corey Mcdonald is a 82 y.o. male with history of hypertension, hyperlipidemia, coronary artery disease with previous MI, atrial fibrillation (Eliquis), abdominal aortic aneurysm, macular degenerative blindness, peripheral vascular disease, and sleep apnea presenting with dizziness, presyncope, slurred speech, and left arm numbness/weakness.  He did not receive IV t-PA due to anticoagulation and improvement in deficits.  Probable TIA:  Non-dominant TIA possibly embolic secondary to atrial fibrillation. In setting of likely presyncopal episode  Resultant - resolution of deficits.  MRI - No acute finding, including infarct.   MRA - not performed  Carotid Doppler - pending  2D Echo - pending  LDL - 71  HgbA1c - pending  VTE prophylaxis - Eliquis  Diet heart healthy/carb modified Room service appropriate? Yes; Fluid consistency: Thin  Eliquis (apixaban) daily prior to admission, now on Eliquis (apixaban) daily  Patient counseled to be compliant with his antithrombotic medications  Ongoing aggressive stroke risk factor management  Therapy recommendations: pending  Disposition: Pending  Hypertension  Stable  Permissive hypertension (OK if < 220/120) but  gradually normalize in 5-7 days  Long-term BP goal normotensive  Hyperlipidemia  Home meds:  Lipitor 20 mg daily resumed in hospital  LDL 71, goal < 70  Continue statin at discharge    Other Stroke Risk Factors  Advanced age  Former cigarette smoker - quit 1989  Overweight, Body mass index is 28.45 kg/m., recommend weight loss, diet and exercise as appropriate   Family hx stroke (father and brother)  Coronary artery disease  Obstructive sleep apnea  Atrial fibrillation  Other Active Problems  Dehydration - BUN 28  Blind from macular degeneration  Hospital day # 0  Corey See PA-C [facility] Pager [phone] 01/10/2016, 1:17 PM  I have personally examined this patient, reviewed notes, independently viewed imaging studies, participated in medical decision making and plan of care.ROS completed by me personally and pertinent positives fully documented  I have made any additions or clarifications directly to the above note. Agree with note above. probable TIA due to atrial fibrillation.Continue apixaban.No data to suggest changing to another DOAC is any  better. Long d/w patient and Dr Ronalee BeltsBhandari and answered questions.I spent a total of  25  minutes in face to face in clinical consultation, greater than 50% of which was counseling/coordinating care about TIA and stroke risk.    Delia HeadyPramod Drelyn Pistilli, MD Medical Director [facility] Pager: [phone] 01/10/2016 5:37 PM  To contact Stroke Continuity provider, please refer to [email or web address]. After hours, contact General Neurology

## 2016-01-10 NOTE — Progress Notes (Signed)
Occupational Therapy Evaluation Patient Details Name: Agustina CaroliCarl Erhart MRN: [id number] DOB: 10/01/1923 Today's Date: 01/10/2016    History of Present Illness Patient is a 82 y/o male with hx of A-fib, CAd, CABG, PAD, AAA, HLD, HTN, macular degeneration, peptic ulcer disease presents with left sided weakness. Head CT, MRI-unremarkable.   Clinical Impression   PTA, [facility] and was independent with ADL and mobility. Walks @ 1 mile/day and walks to dining hall daily.  Pt states that he felt "lightheaded" and pushed his "emergency alert button" to get medical help. HR increased with activity (max 177 with climbing stair). BP sitting149/64. Standing 163/82. 1/4 dyspnea. Pt/daughter state they feel he is close to baseline. Feel pt is safe to DC back to independent living apt when medically stable. Recommend pt walk with staff. OT signing off.     Follow Up Recommendations  No OT follow up;Supervision - Intermittent    Equipment Recommendations  Tub/shower bench    Recommendations for Other Services       Precautions / Restrictions Precautions Precautions: Fall Precaution Comments: watch HR Restrictions Weight Bearing Restrictions: No      Mobility Bed Mobility               General bed mobility comments: OOB in chair  Transfers Overall transfer level: Needs assistance Equipment used: None Transfers: Sit to/from Stand Sit to Stand: Supervision         General transfer comment: Supervision for safety. Reports wanting to fall foward sometimes when he stands so has to wait a few sec prior to walking.     Balance Overall balance assessment: Needs assistance Sitting-balance support: Feet supported;No upper extremity supported Sitting balance-Leahy Scale: Good Sitting balance - Comments: Able to donn socks and shoes without difficulty.    Standing balance support: During functional activity Standing balance-Leahy Scale:  Fair                              ADL Overall ADL's : At baseline                                       General ADL Comments: Pt typically sponge bathes because he is nervous about stepping over tub. Educated pt on tub tranfser and availability of tub bench to increase safety with transfers. Huel Cote/daughter prestn for education. Pt/daughter state that pt's apt is adapted for his visual deficits.     Vision  macular degeneration. "very limited vision " per pt Wears glasses   Perception     Praxis      Pertinent Vitals/Pain Pain Assessment: No/denies pain     Hand Dominance Right   Extremity/Trunk Assessment Upper Extremity Assessment Upper Extremity Assessment: Overall WFL for tasks assessed   Lower Extremity Assessment Lower Extremity Assessment: Defer to PT evaluation LLE Sensation: decreased light touch   Cervical / Trunk Assessment Cervical / Trunk Assessment: Normal   Communication Communication Communication: No difficulties   Cognition Arousal/Alertness: Awake/alert Behavior During Therapy: WFL for tasks assessed/performed Overall Cognitive Status: Within Functional Limits for tasks assessed                     General Comments       Exercises       Shoulder Instructions      Home Living  Family/patient expects to be discharged to:: Private residence Living Arrangements: Alone Available Help at Discharge: Family;Available PRN/intermittently Type of Home: Independent living facility Home Access: Level entry     Home Layout: One level     Bathroom Shower/Tub: Tub/shower unit Shower/tub characteristics: Engineer, building servicesCurtain Bathroom Toilet: Standard Bathroom Accessibility: Yes How Accessible: Accessible via walker Home Equipment: Walker - 2 wheels;Bedside commode;Grab bars - tub/shower;Hand held shower head;Cane - single point;Wheelchair - manual   Additional Comments: pt legally blind but does everything on own. does have staff  that cleans place every other week.      Prior Functioning/Environment Level of Independence: Independent        Comments: Walks to dining hall. Walks 1 mile per day.         OT Problem List: Decreased knowledge of use of DME or AE;Decreased activity tolerance   OT Treatment/Interventions:      OT Goals(Current goals can be found in the care plan section) Acute Rehab OT Goals Patient Stated Goal: to not have to use a RW OT Goal Formulation: All assessment and education complete, DC therapy  OT Frequency:     Barriers to D/C:            Co-evaluation   Reason for Co-Treatment: For patient/therapist safety PT goals addressed during session: Mobility/safety with mobility;Strengthening/ROM;Balance        End of Session Equipment Utilized During Treatment: Gait belt Nurse Communication: Mobility status  Activity Tolerance: Patient tolerated treatment well Patient left: in chair;with call bell/phone within reach;with chair alarm set;with family/visitor present   Time: 1415-1451 OT Time Calculation (min): 36 min Charges:  OT General Charges $OT Visit: 1 Procedure OT Evaluation $OT Eval Moderate Complexity: 1 Procedure G-Codes: OT G-codes **NOT FOR INPATIENT CLASS** Functional Assessment Tool Used: clinical judgement Functional Limitation: Self care Self Care Current Status ([id number]): At least 1 percent but less than 20 percent impaired, limited or restricted Self Care Goal Status ([id number]): At least 1 percent but less than 20 percent impaired, limited or restricted Self Care Discharge Status [phone]): At least 1 percent but less than 20 percent impaired, limited or restricted  Lutisha Knoche,HILLARY 01/10/2016, 3:05 PM   [facility] Jubal Rademaker, OTR/L  [phone] 01/10/2016

## 2016-01-11 ENCOUNTER — Encounter (HOSPITAL_COMMUNITY): Payer: Self-pay

## 2016-01-11 ENCOUNTER — Observation Stay (HOSPITAL_BASED_OUTPATIENT_CLINIC_OR_DEPARTMENT_OTHER): Payer: Medicare Other

## 2016-01-11 ENCOUNTER — Observation Stay (HOSPITAL_COMMUNITY): Payer: Medicare Other

## 2016-01-11 ENCOUNTER — Encounter (HOSPITAL_COMMUNITY): Payer: Self-pay | Admitting: Radiology

## 2016-01-11 DIAGNOSIS — G458 Other transient cerebral ischemic attacks and related syndromes: Secondary | ICD-10-CM | POA: Diagnosis not present

## 2016-01-11 DIAGNOSIS — I6789 Other cerebrovascular disease: Secondary | ICD-10-CM | POA: Diagnosis not present

## 2016-01-11 DIAGNOSIS — I6522 Occlusion and stenosis of left carotid artery: Secondary | ICD-10-CM | POA: Diagnosis not present

## 2016-01-11 DIAGNOSIS — I481 Persistent atrial fibrillation: Secondary | ICD-10-CM | POA: Diagnosis not present

## 2016-01-11 DIAGNOSIS — G459 Transient cerebral ischemic attack, unspecified: Secondary | ICD-10-CM | POA: Diagnosis not present

## 2016-01-11 DIAGNOSIS — I10 Essential (primary) hypertension: Secondary | ICD-10-CM | POA: Diagnosis not present

## 2016-01-11 LAB — HEMOGLOBIN A1C
HEMOGLOBIN A1C: 6.1 % — AB (ref 4.8–5.6)
Mean Plasma Glucose: 128 mg/dL

## 2016-01-11 LAB — GLUCOSE, CAPILLARY
GLUCOSE-CAPILLARY: 95 mg/dL (ref 65–99)
Glucose-Capillary: 114 mg/dL — ABNORMAL HIGH (ref 65–99)

## 2016-01-11 LAB — ECHOCARDIOGRAM COMPLETE
HEIGHTINCHES: 71 in
WEIGHTICAEL: 3264 [oz_av]

## 2016-01-11 MED ORDER — IOPAMIDOL (ISOVUE-370) INJECTION 76%
INTRAVENOUS | Status: AC
  Administered 2016-01-11: 50 mL
  Filled 2016-01-11: qty 50

## 2016-01-11 NOTE — Progress Notes (Signed)
[place]

## 2016-01-11 NOTE — Discharge Summary (Signed)
Physician Discharge Summary  Agustina CaroliCarl Aerts ZOX:[id number] DOB: 09/16/1923 DOA: 01/09/2016  PCP: [name],[name], MD  Admit date: 01/09/2016 Discharge date: 01/11/2016  Admitted From:Home Disposition:Home, ILF   Recommendations for Outpatient Follow-up:  1. Follow up with PCP in 1-2 weeks 2. Please obtain BMP/CBC in one week  Home Health:no Equipment/Devices:no Discharge Condition:stable CODE STATUS:Full Diet recommendation:Heart healthy  Brief/Interim Summary: 82 y.o.malewith a history of AAA, anemia, hyperlipidemia, CAD with NSTEMI in 03/15/10, hyperlipidemia, macular degeneration, hypertension, PAD presented with left sided weakness. Code stroke was called in ER. Neurology following.  # TIA (transient ischemic attack): Patient presented with left upper extremity weakness and right facial droop. I discussed with patient's son at bedside. He reported that right facial droop is old. He has No upper extremity weakness today. -CT head, CT NGO and MRI of brain showed no acute finding. -Echocardiogram reviewed. -LDL 71, A1c 6.1 -Continue Lipitor, on eliquis -Patient's symptoms resolved. He is very eager to go home today. Discussed with Dr. Roda ShuttersXu from stroke team. Patient is okay to go home today. Patient has no orthostatic hypotension. I discussed with the patient and his son in detail. No change in medication. Evaluated by PT, OT and Child psychotherapistsocial worker.  # Dyslipidemia: on statin  # Essential hypertension: Continue home medications. Monitor BP  #  Atrial fibrillation (HCC); on eliquis, metoprolol. Advised outpatient follow-up with cardiologist.    #Chronic diastolic CHF (congestive heart failure) (HCC): stable  # CKD stage III: stable creatinine. Avoid nephrotoxins.  82 y.o.Patient is clinically stable. Discussed discharge planning with the patient and family. Advised outpatient follow-up.   Discharge Diagnoses:  Principal Problem:   TIA (transient ischemic attack) Active  Problems:   Dyslipidemia   Essential hypertension   Atrial fibrillation (HCC)   Chronic diastolic CHF (congestive heart failure) [facility](HCC)    Discharge Instructions  Discharge Instructions    Call MD for:  difficulty breathing, headache or visual disturbances    Complete by:  As directed    Call MD for:  extreme fatigue    Complete by:  As directed    Call MD for:  hives    Complete by:  As directed    Call MD for:  persistant dizziness or light-headedness    Complete by:  As directed    Call MD for:  persistant nausea and vomiting    Complete by:  As directed    Call MD for:  severe uncontrolled pain    Complete by:  As directed    Call MD for:  temperature >100.4    Complete by:  As directed    Diet - low sodium heart healthy    Complete by:  As directed    Discharge instructions    Complete by:  As directed    Please follow with PCP in 1-2 weeks.   Increase activity slowly    Complete by:  As directed        Medication List    TAKE these medications   acetaminophen 325 MG tablet Commonly known as:  TYLENOL Take 650 mg by mouth every 6 (six) hours as needed for moderate pain.   atorvastatin 20 MG tablet Commonly known as:  LIPITOR TAKE ONE TABLET BY MOUTH ONCE DAILY   docusate sodium 100 MG capsule Commonly known as:  COLACE Take 100 mg by mouth daily before supper.   ELIQUIS 5 MG Tabs tablet Generic drug:  apixaban TAKE ONE TABLET BY MOUTH TWICE DAILY   furosemide 20 MG tablet Commonly known  as:  LASIX Take 1 tablet (20 mg total) by mouth daily.   ibuprofen 200 MG tablet Commonly known as:  ADVIL,MOTRIN Take 200 mg by mouth every 6 (six) hours as needed (pain).   IRON 27 PO Take 27 mg by mouth daily before supper.   metoprolol tartrate 25 MG tablet Commonly known as:  LOPRESSOR Take 25 mg by mouth 2 (two) times daily.   multivitamin with minerals Tabs tablet Take 1 tablet by mouth daily.   nitroGLYCERIN 0.4 MG SL tablet Commonly known as:   NITROSTAT Place 1 tablet (0.4 mg total) under the tongue every 5 (five) minutes as needed. FOR CHEST PAIN What changed:  reasons to take this  additional instructions   pantoprazole 40 MG tablet Commonly known as:  PROTONIX TAKE ONE TABLET BY MOUTH ONCE DAILY What changed:  See the new instructions.   PRESCRIPTION MEDICATION Inhale into the lungs at bedtime. CPAP - pressure is 10   tamsulosin 0.4 MG Caps capsule Commonly known as:  FLOMAX TAKE ONE CAPSULE BY MOUTH ONCE DAILY IN THE EVENING What changed:  See the new instructions.      Follow-up Information    Rogelia Boga, MD. Schedule an appointment as soon as possible for a visit in 1 week(s).   Specialty:  Internal Medicine Contact information: [address] [place] [address] [phone]          Allergies  Allergen Reactions  . Cilostazol Other (See Comments)    Adverse reaction per patient - leg cramps  . Doxazosin Other (See Comments)    Leg cramps    Consultations:Stroke neurology   Procedures/Studies: CT head, MRI, CT angio of neck, echo.   Subjective: Patient was seen and examined at bedside. Patient reported feeling good and wanted to go home today. Denied headache, dizziness, nausea, vomiting, chest pain, shortness of breath, abdominal pain.   Discharge Exam: Vitals:   01/11/16 0513 01/11/16 1127  BP: (!) 155/86 140/89  Pulse: 96 (!) 102  Resp: 18 18  Temp: 97.7 F (36.5 C) 97.9 F (36.6 C)   Vitals:   01/10/16 2257 01/11/16 0126 01/11/16 0513 01/11/16 1127  BP:  (!) 150/65 (!) 155/86 140/89  Pulse: 96 96 96 (!) 102  Resp: 18 18 18 18   Temp:  97.5 F (36.4 C) 97.7 F (36.5 C) 97.9 F (36.6 C)  TempSrc:  Oral Oral Oral  SpO2: 94% 98% 96% 99%  Weight:      Height:        General: Pleasant male sitting on chair, not in distress.  Cardiovascular: RRR, S1/S2 +, no rubs, no gallops Respiratory: CTA bilaterally, no wheezing, no rhonchi Abdominal: Soft, NT,  ND, bowel sounds + Extremities: no edema, no cyanosis Neurologic: Alert, awake, mild right facial droop which is chronic, oriented 3.   The results of significant diagnostics from this hospitalization (including imaging, microbiology, ancillary and laboratory) are listed below for reference.     Microbiology: No results found for this or any previous visit (from the past 240 hour(s)).   Labs: BNP (last 3 results)  Recent Labs  01/27/15 2202  BNP 272.0*   Basic Metabolic Panel:  Recent Labs Lab 01/09/16 1250 01/09/16 1256  NA 137 137  K 3.9 4.5  CL 105 105  CO2 20*  --   GLUCOSE 117* 118*  BUN 20 28*  CREATININE 1.26* 1.20  CALCIUM 9.9  --    Liver Function Tests:  Recent Labs Lab 01/09/16 1250  AST 23  ALT 23  ALKPHOS 61  BILITOT 0.8  PROT 7.6  ALBUMIN 3.9   No results for input(s): LIPASE, AMYLASE in the last 168 hours. No results for input(s): AMMONIA in the last 168 hours. CBC:  Recent Labs Lab 01/09/16 1250 01/09/16 1256  WBC 9.7  --   NEUTROABS 7.1  --   HGB 13.7 14.3  HCT 40.5 42.0  MCV 89.4  --   PLT 255  --    Cardiac Enzymes: No results for input(s): CKTOTAL, CKMB, CKMBINDEX, TROPONINI in the last 168 hours. BNP: Invalid input(s): POCBNP CBG:  Recent Labs Lab 01/10/16 1151 01/10/16 1633 01/10/16 2156 01/11/16 0616 01/11/16 1121  GLUCAP 123* 81 81 114* 95   D-Dimer No results for input(s): DDIMER in the last 72 hours. Hgb A1c  Recent Labs  01/10/16 0554  HGBA1C 6.1*   Lipid Profile  Recent Labs  01/10/16 0554  CHOL 134  HDL 40*  LDLCALC 71  TRIG [id number]  CHOLHDL 3.4   Thyroid function studies No results for input(s): TSH, T4TOTAL, T3FREE, THYROIDAB in the last 72 hours.  Invalid input(s): FREET3 Anemia work up No results for input(s): VITAMINB12, FOLATE, FERRITIN, TIBC, IRON, RETICCTPCT in the last 72 hours. Urinalysis    Component Value Date/Time   COLORURINE YELLOW 12/17/2010 1123   APPEARANCEUR CLEAR  12/17/2010 1123   LABSPEC 1.011 12/17/2010 1123   PHURINE 7.0 12/17/2010 1123   GLUCOSEU NEGATIVE 12/17/2010 1123   HGBUR NEGATIVE 12/17/2010 1123   HGBUR negative 09/03/2008 1110   BILIRUBINUR n 02/13/2014 1114   KETONESUR NEGATIVE 12/17/2010 1123   PROTEINUR n 02/13/2014 1114   PROTEINUR NEGATIVE 12/17/2010 1123   UROBILINOGEN 0.2 02/13/2014 1114   UROBILINOGEN 0.2 12/17/2010 1123   NITRITE n 02/13/2014 1114   NITRITE NEGATIVE 12/17/2010 1123   LEUKOCYTESUR Negative 02/13/2014 1114   Sepsis Labs Invalid input(s): PROCALCITONIN,  WBC,  LACTICIDVEN Microbiology No results found for this or any previous visit (from the past 240 hour(s)).   Time coordinating discharge: Over 30 minutes  SIGNED:   Maxie Barbron Prasad Nitara Szczerba, MD  [facility] 01/11/2016, 1:57 PM  If 7PM-7AM, please contact night-coverage [email or web address] Password [email or web address]

## 2016-01-11 NOTE — Care Management Obs Status (Signed)
MEDICARE OBSERVATION STATUS NOTIFICATION   Patient Details  Name: Corey Mcdonald MRN: [id number] Date of Birth: 12-19-1923   Medicare Observation Status Notification Given:  Yes    Kermit BaloKelli F Cartier Washko, RN 01/11/2016, 12:43 PM

## 2016-01-11 NOTE — Clinical Social Work Note (Signed)
CSW consulted for New SNF. PT is recommending no follow up. Per RNCM, pt is from independent living at [facility]. Pt will return to home when stable. CSW is signing off as no further needs identified.   Dede QuerySarah Johnston Maddocks, MSW, LCSW Clinical Social Worker  [phone]

## 2016-01-11 NOTE — Plan of Care (Signed)
Problem: Nutrition: Goal: Adequate nutrition will be maintained Outcome: Progressing Ate all of bkft

## 2016-01-11 NOTE — Progress Notes (Signed)
  Echocardiogram 2D Echocardiogram has been performed.  Tye SavoyCasey N Jonus Coble 01/11/2016, 11:08 AM

## 2016-01-11 NOTE — Care Management Note (Signed)
Case Management Note  Patient Details  Name: Corey Mcdonald MRN: [id number] Date of Birth: 1923-10-01  Subjective/Objective:  Pt in with TIA. He is from [facility].                Action/Plan: No f/u per PT/OT. CM following for d/c needs.   Expected Discharge Date:                  Expected Discharge Plan:  Home/Self Care  In-House Referral:     Discharge planning Services     Post Acute Care Choice:    Choice offered to:     DME Arranged:    DME Agency:     HH Arranged:    HH Agency:     Status of Service:  In process, will continue to follow  If discussed at Long Length of Stay Meetings, dates discussed:    Additional Comments:  Kermit BaloKelli F Laquetta Racey, RN 01/11/2016, 11:35 AM

## 2016-01-11 NOTE — Progress Notes (Signed)
STROKE TEAM PROGRESS NOTE   SUBJECTIVE (INTERVAL HISTORY) Son is at bedside. Son does not think pt had TIA and he stated that pt facial droop is chronic. However, on exam, it seems that pt had chronic right bell's palsy but son is not aware of that history. Pt denies any discomfort. They want to be discharged today.    OBJECTIVE Temp:  [97.5 F (36.4 C)-97.8 F (36.6 C)] 97.7 F (36.5 C) (11/20 0513) Pulse Rate:  [85-98] 96 (11/20 0513) Cardiac Rhythm: Atrial fibrillation (11/19 2004) Resp:  [18-20] 18 (11/20 0513) BP: (117-157)/(61-86) 155/86 (11/20 0513) SpO2:  [94 %-99 %] 96 % (11/20 0513)  CBC:   Recent Labs Lab 01/09/16 1250 01/09/16 1256  WBC 9.7  --   NEUTROABS 7.1  --   HGB 13.7 14.3  HCT 40.5 42.0  MCV 89.4  --   PLT 255  --     Basic Metabolic Panel:   Recent Labs Lab 01/09/16 1250 01/09/16 1256  NA 137 137  K 3.9 4.5  CL 105 105  CO2 20*  --   GLUCOSE 117* 118*  BUN 20 28*  CREATININE 1.26* 1.20  CALCIUM 9.9  --     Lipid Panel:     Component Value Date/Time   CHOL 134 01/10/2016 0554   TRIG 116 01/10/2016 0554   HDL 40 (L) 01/10/2016 0554   CHOLHDL 3.4 01/10/2016 0554   VLDL 23 01/10/2016 0554   LDLCALC 71 01/10/2016 0554   HgbA1c: No results found for: HGBA1C Urine Drug Screen: No results found for: LABOPIA, COCAINSCRNUR, LABBENZ, AMPHETMU, THCU, LABBARB    IMAGING I have personally reviewed the radiological images below and agree with the radiology interpretations.  Ct Head Code Stroke Wo Contrast` 01/09/2016 1. Negative for acute hemorrhage.  2. Questionable loss of gray-white differentiation in the left parietal region. ASPECTS is 9.   Mr Brain Wo Contrast 01/09/2016 1. No acute finding, including infarct.  2. Moderate chronic microvascular disease.   Ct Angio Head W Or Wo Contrast Ct Angio Neck W Or Wo Contrast 01/11/2016 1. No intracranial arterial occlusion or high-grade stenosis. 2. Extensive aortic and cervical carotid  atherosclerotic calcification without hemodynamically significant stenosis by NASCET criteria. 3. Moderate narrowing of the left internal carotid artery at the skullbase, secondary to calcified plaque.   TTE - Left ventricle: Mid and basal inferior wall hypokinesis The   cavity size was normal. Systolic function was normal. The   estimated ejection fraction was in the range of 50% to 55%. Wall   motion was normal; there were no regional wall motion   abnormalities. Left ventricular diastolic function parameters   were normal. - Aortic valve: Valve mobility was restricted. There was mild   stenosis. There was mild regurgitation. Valve area (VTI): 0.86   cm^2. Valve area (Vmax): 0.86 cm^2. Valve area (Vmean): 0.79   cm^2. - Mitral valve: Calcified annulus. There was mild regurgitation. - Left atrium: The atrium was mildly dilated.   PHYSICAL EXAM  Temp:  [97.5 F (36.4 C)-97.9 F (36.6 C)] 97.9 F (36.6 C) (11/20 1127) Pulse Rate:  [96-102] 102 (11/20 1127) Resp:  [18-20] 18 (11/20 1127) BP: (140-157)/(65-89) 140/89 (11/20 1127) SpO2:  [94 %-99 %] 99 % (11/20 1127)  General - Well nourished, well developed, in no apparent distress.  Ophthalmologic - Fundi not visualized due to eye movement.  Cardiovascular - irregularly irregular heart rate and rhythm.  Mental Status -  Level of arousal and orientation to time,  place, and person were intact. Language including expression, naming, repetition, comprehension was assessed and found intact. Fund of Knowledge was assessed and was intact.  Cranial Nerves II - XII - II - legally blind bilaterally, but able to see HW but not FC. III, IV, VI - Extraocular movements intact. V - Facial sensation intact bilaterally. VII - right peripheral VII palsy, not able to close right eye complete and right facial droop. VIII - hard of hearing & vestibular intact bilaterally. X - Palate elevates symmetrically. XI - Chin turning & shoulder shrug  intact bilaterally. XII - Tongue protrusion intact  Motor Strength - The patient's strength was normal in all extremities and pronator drift was absent.  Bulk was normal and fasciculations were absent   Motor Tone - Muscle tone was assessed at the neck and appendages and was normal.  Reflexes - The patient's reflexes were 1+ in all extremities and he had no pathological reflexes.  Sensory - Light touch, temperature/pinprick were assessed and were symmetrical.    Coordination - The patient had normal movements in the hands and feet with no ataxia or dysmetria.  Tremor was absent.  Gait and Station - deferred due to safety concerns.     ASSESSMENT/PLAN Mr. Agustina CaroliCarl Buehrer is a 82 y.o. male with history of hypertension, hyperlipidemia, CAD/MI, atrial fibrillation on Eliquis, AAA, macular degenerative with blindness, PVD, and OSA presenting with dizziness, presyncope, slurred speech.  He did not receive IV t-PA due to anticoagulation and improvement in deficits.  Presyncope vs. BPPV - pt can not recall if the episode was vertigo or lightheadedness.  TIA felt less likely although pt has hx of atrial fibrillation on eliquis.  Resultant - resolution of deficits.  MRI - No acute finding, including infarct.   CTA head and neck No significant large vessel stenosis (narrow L ICA at skull base, Extensive aortic and cervical carotid atherosclerosis without significant stenosis)  2D Echo - EF 50-55%  LDL - 71  HgbA1c - 6.1  VTE prophylaxis - Eliquis Diet heart healthy/carb modified Room service appropriate? Yes; Fluid consistency: Thin  Eliquis (apixaban) bid prior to admission, now on Eliquis (apixaban) bid. Continue eliquis on discharge.  Recommend to refer to outpt PT for BPPV maneuver training.  Patient counseled to be compliant with his antithrombotic medications  Ongoing aggressive stroke risk factor management  Therapy recommendations: no therapy needs  Disposition: return  home  Atrial Fibrillation  Home anticoagulation:  Eliquis (apixaban) daily continued in the hospital  Continue Eliquis at discharge   Hypertension  Stable  BP goal normotensive  Orthostatic vital stable - no hypotension  Hyperlipidemia  Home meds:  Lipitor 20 mg daily resumed in hospital  LDL 71, goal < 70  Continue statin at discharge  Other Stroke Risk Factors  Advanced age  Former cigarette smoker - quit 1989  Overweight, Body mass index is 28.45 kg/m., recommend weight loss, diet and exercise as appropriate   Family hx stroke (father and brother)  CAD / MI  Obstructive sleep apnea on CPAP at home  Chronic diastolic HF  PAD  Other Active Problems  CKD Stage III  Dehydration - BUN 28  Blind from macular degeneration  RLS  Hospital day # 0  Neurology will sign off. Please call with questions. No neuro follow up needed at this time. Thanks for the consult.  Marvel PlanJindong Lajoyce Tamura, MD PhD Stroke Neurology 01/11/2016 2:20 PM   To contact Stroke Continuity provider, please refer to [email or web address]. After hours, contact General  Neurology

## 2016-01-12 ENCOUNTER — Ambulatory Visit: Payer: Medicare Other | Admitting: Vascular Surgery

## 2016-01-12 ENCOUNTER — Other Ambulatory Visit (HOSPITAL_COMMUNITY): Payer: Medicare Other

## 2016-01-13 ENCOUNTER — Telehealth: Payer: Self-pay

## 2016-01-13 NOTE — Telephone Encounter (Signed)
LMTCB

## 2016-01-14 NOTE — Progress Notes (Deleted)
Report to Susan, RN, to assume care of patient at this time.  

## 2016-01-14 NOTE — Progress Notes (Signed)
Report to Susan, RN, to assume care of patient at this time.  

## 2016-01-18 NOTE — Telephone Encounter (Signed)
Past TCM timeframe due to holiday. Pt still needs post-hosp OV.  LMTCB

## 2016-01-19 NOTE — Telephone Encounter (Signed)
Pt's son called and scheduled hosp f/u 01/25/16 with Dr Kirtland BouchardK. Nothing further needed.

## 2016-01-22 ENCOUNTER — Encounter: Payer: Self-pay | Admitting: Internal Medicine

## 2016-01-22 ENCOUNTER — Ambulatory Visit (INDEPENDENT_AMBULATORY_CARE_PROVIDER_SITE_OTHER): Payer: Medicare Other | Admitting: Internal Medicine

## 2016-01-22 VITALS — BP 162/74 | HR 83 | Ht 71.0 in | Wt 204.6 lb

## 2016-01-22 DIAGNOSIS — I4891 Unspecified atrial fibrillation: Secondary | ICD-10-CM

## 2016-01-22 DIAGNOSIS — I5043 Acute on chronic combined systolic (congestive) and diastolic (congestive) heart failure: Secondary | ICD-10-CM | POA: Diagnosis not present

## 2016-01-22 DIAGNOSIS — I482 Chronic atrial fibrillation, unspecified: Secondary | ICD-10-CM

## 2016-01-22 DIAGNOSIS — Z951 Presence of aortocoronary bypass graft: Secondary | ICD-10-CM

## 2016-01-22 MED ORDER — FUROSEMIDE 20 MG PO TABS: 20.0000 mg | ORAL_TABLET | Freq: Every day | ORAL | 6 refills | Status: DC

## 2016-01-22 NOTE — Patient Instructions (Signed)
Medication Instructions:   TAKE FUROSEMIDE 20 MG ONCE DAILY  Labwork:  Your physician recommends that you return for lab work in: ONE WEEK WITH MEDICAL DOCTOR  Follow-Up:  Your physician recommends that you schedule a follow-up appointment in: ONE MONTH WITH DR HILTY
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
## 2016-01-25 ENCOUNTER — Ambulatory Visit (INDEPENDENT_AMBULATORY_CARE_PROVIDER_SITE_OTHER): Payer: Medicare Other | Admitting: Internal Medicine

## 2016-01-25 ENCOUNTER — Telehealth: Payer: Self-pay | Admitting: Internal Medicine

## 2016-01-25 ENCOUNTER — Encounter: Payer: Self-pay | Admitting: Internal Medicine

## 2016-01-25 VITALS — BP 126/80 | HR 70 | Temp 98.1°F | Resp 20 | Ht 71.0 in | Wt 203.4 lb

## 2016-01-25 DIAGNOSIS — I481 Persistent atrial fibrillation: Secondary | ICD-10-CM

## 2016-01-25 DIAGNOSIS — Z951 Presence of aortocoronary bypass graft: Secondary | ICD-10-CM

## 2016-01-25 DIAGNOSIS — N183 Chronic kidney disease, stage 3 unspecified: Secondary | ICD-10-CM

## 2016-01-25 DIAGNOSIS — I10 Essential (primary) hypertension: Secondary | ICD-10-CM | POA: Diagnosis not present

## 2016-01-25 DIAGNOSIS — I5043 Acute on chronic combined systolic (congestive) and diastolic (congestive) heart failure: Secondary | ICD-10-CM | POA: Diagnosis not present

## 2016-01-25 DIAGNOSIS — R600 Localized edema: Secondary | ICD-10-CM

## 2016-01-25 DIAGNOSIS — I5032 Chronic diastolic (congestive) heart failure: Secondary | ICD-10-CM

## 2016-01-25 DIAGNOSIS — I4819 Other persistent atrial fibrillation: Secondary | ICD-10-CM

## 2016-01-25 LAB — BASIC METABOLIC PANEL
BUN: 23 mg/dL (ref 6–23)
CHLORIDE: 101 meq/L (ref 96–112)
CO2: 30 meq/L (ref 19–32)
CREATININE: 1.24 mg/dL (ref 0.40–1.50)
Calcium: 9.7 mg/dL (ref 8.4–10.5)
GFR: 58.2 mL/min — ABNORMAL LOW (ref >60.00)
Glucose, Bld: 72 mg/dL (ref 70–99)
Potassium: 4.6 mEq/L (ref 3.5–5.1)
Sodium: 139 mEq/L (ref 135–145)

## 2016-01-25 LAB — BRAIN NATRIURETIC PEPTIDE: Pro B Natriuretic peptide (BNP): 241 pg/mL — ABNORMAL HIGH (ref 0.0–100.0)

## 2016-01-25 MED ORDER — FUROSEMIDE 20 MG PO TABS: ORAL_TABLET | ORAL | 6 refills | Status: DC

## 2016-01-25 MED ORDER — FERROUS GLUCONATE 240 (27 FE) MG PO TABS: 240.0000 mg | ORAL_TABLET | Freq: Three times a day (TID) | ORAL | Status: DC

## 2016-01-25 NOTE — Telephone Encounter (Signed)
No answer or VM pickup when dialed. 

## 2016-01-25 NOTE — Progress Notes (Signed)
Subjective:    Patient ID: Agustina CaroliCarl Filley, male    DOB: Feb 26, 1923, 82 y.o.   MRN: [id number]  HPI  Admit date: 01/09/2016 Discharge date: 01/11/2016  Admitted From:Home Disposition:Home, ILF  CODE STATUS:Full Diet recommendation:Heart healthy  Brief/Interim Summary: 82 y.o.malewith a history of AAA, anemia, hyperlipidemia, CAD with NSTEMI in 03/15/10, hyperlipidemia, macular degeneration, hypertension, PAD presented with left sided weakness. Code stroke was called in ER. Neurology following.  #TIA (transient ischemic attack): Patient presented with left upper extremity weakness and right facial droop. I discussed with patient's son at bedside. He reported that right facial droop is old. He has No upper extremity weakness today. -CT head, CT NGO and MRI of brain showed no acute finding. -Echocardiogram reviewed. -LDL 71, A1c 6.1 -Continue Lipitor, on eliquis -Patient's symptoms resolved. He is very eager to go home today. Discussed with Dr. Roda ShuttersXu from stroke team. Patient is okay to go home today. Patient has no orthostatic hypotension. I discussed with the patient and his son in detail. No change in medication. Evaluated by PT, OT and Child psychotherapistsocial worker.  #Dyslipidemia: on statin  #Essential hypertension: Continue home medications. Monitor BP  #Atrial fibrillation (HCC); on eliquis, metoprolol. Advised outpatient follow-up with cardiologist.  #Chronic diastolic CHF (congestive heart failure) (HCC): stable  # CKD stage III: stable creatinine. Avoid nephrotoxins.  Patient is clinically stable. Discussed discharge planning with the patient and family. Advised outpatient follow-up.   Discharge Diagnoses:  Principal Problem:   TIA (transient ischemic attack) Active Problems:   Dyslipidemia   Essential hypertension   Atrial fibrillation (HCC)   Chronic diastolic CHF (congestive heart failure) (HCC)  82 year old patient seen today for follow-up after recent hospital  discharge and for transitional care management. Hospital records reviewed  There was some suspicion of a possible TIA with left-sided weakness.  Patient presented with weakness and dizziness, but denies any focal symptoms.  He does have a history of chronic right facial weakness.  Neuro evaluation was negative.  Since his discharge,he has done well without any focal neurological symptoms.  He has been seen by cardiology in follow-up.  He was resumed on furosemide 20 mg daily for 2 weeks but has been off for several days.  He does have a history of chronic diastolic heart failure.  He has essential hypertension and coronary artery disease.  Remains on anticoagulation due to atrial fibrillation.  Remains on statin therapy  Past Medical History:  Diagnosis Date  . AAA (abdominal aortic aneurysm) (HCC)   . ABDOMINAL AORTIC ANEURYSM   . Anemia January 2012   admitted with acute MI March 15, 2010 and has significant acute blood loss anemia requiring transfusions of two units of packed RBCs.  Status post upper endoscopy March 22, 2010 without High-Risk bleeding lesion.  History of peptic ulcer disease  . Atrial fibrillation (HCC)   . BENIGN PROSTATIC HYPERTROPHY, HX OF    . COLONIC POLYPS, HX OF   . CORONARY ARTERY DISEASE    non-ST segment MI, March 15, 2010  . History of Doppler ultrasound 05/2011   h/o claudication, stable ABIs  . History of nuclear stress test 2007   persantine; normal, low risk   . HYPERLIPIDEMIA   . HYPERTENSION   . MACULAR DEGENERATION   . Myocardial infarction 02/2010  . PAD (peripheral artery disease) (HCC)   . PEPTIC ULCER DISEASE, HX OF   . PVD (peripheral vascular disease) (HCC)   . RESTLESS LEGS SYNDROME   . SLEEP APNEA   .  VERTIGO      Social History   Social History  . Marital status: Married    Spouse name: N/A  . Number of children: 2  . Years of education: N/A   Occupational History  . Not on file.   Social History Main Topics  . Smoking  status: Former Smoker    Packs/day: 1.00    Years: 40.00    Types: Cigarettes    Quit date: 02/22/1987  . Smokeless tobacco: Never Used  . Alcohol use No  . Drug use: No  . Sexual activity: Not on file   Other Topics Concern  . Not on file   Social History Narrative  . No narrative on file    Past Surgical History:  Procedure Laterality Date  . ABDOMINAL AORTIC ANEURYSM REPAIR     stenting   . CARDIAC CATHETERIZATION  02/2010   r/t NSTEMI; loss of SVG to RCA  . COLONOSCOPY WITH PROPOFOL N/A 02/04/2015   Procedure: COLONOSCOPY WITH PROPOFOL;  Surgeon: Charlott RakesVincent Schooler, MD;  [facility];  Service: Endoscopy;  Laterality: N/A;  . CORONARY ARTERY BYPASS GRAFT  12/1995   x5; LIMA to ALD, vein graft to RCA (now occluded),SVG to OM, SVG to diagonal  . ESOPHAGOGASTRODUODENOSCOPY N/A 01/28/2015   Procedure: ESOPHAGOGASTRODUODENOSCOPY (EGD);  Surgeon: Vida RiggerMarc Magod, MD;  [facility];  Service: Endoscopy;  Laterality: N/A;  . INGUINAL HERNIA REPAIR     Left inguinal  . TRANSTHORACIC ECHOCARDIOGRAM  04/2011   EF 55-60%; mild LVH; grade 1 diastolic dysfunction    Family History  Problem Relation Age of Onset  . Heart disease Mother   . Heart disease Father   . Stroke Father   . Heart disease Sister   . Macular degeneration Sister   . Heart disease Sister   . Stroke Brother   . Hypertension Sister   . Heart Problems Sister     x2    Allergies  Allergen Reactions  . Cilostazol Other (See Comments)    Adverse reaction per patient - leg cramps  . Doxazosin Other (See Comments)    Leg cramps    Current Outpatient Prescriptions on File Prior to Visit  Medication Sig Dispense Refill  . acetaminophen (TYLENOL) 325 MG tablet Take 650 mg by mouth every 6 (six) hours as needed for moderate pain.     Marland Kitchen. atorvastatin (LIPITOR) 20 MG tablet TAKE ONE TABLET BY MOUTH ONCE DAILY 90 tablet 1  . docusate sodium (COLACE) 100 MG capsule Take 100 mg by mouth daily before  supper.     Marland Kitchen. ELIQUIS 5 MG TABS tablet TAKE ONE TABLET BY MOUTH TWICE DAILY 60 tablet 3  . metoprolol tartrate (LOPRESSOR) 25 MG tablet Take 25 mg by mouth 2 (two) times daily.    . Multiple Vitamin (MULTIVITAMIN WITH MINERALS) TABS tablet Take 1 tablet by mouth daily.    . nitroGLYCERIN (NITROSTAT) 0.4 MG SL tablet Place 1 tablet (0.4 mg total) under the tongue every 5 (five) minutes as needed. FOR CHEST PAIN (Patient taking differently: Place 0.4 mg under the tongue every 5 (five) minutes as needed for chest pain. ) 25 tablet 6  . pantoprazole (PROTONIX) 40 MG tablet TAKE ONE TABLET BY MOUTH ONCE DAILY (Patient taking differently: TAKE ONE TABLET BY MOUTH ONCE DAILY BEFORE SUPPER) 60 tablet 2  . PRESCRIPTION MEDICATION Inhale into the lungs at bedtime. CPAP - pressure is 10    . tamsulosin (FLOMAX) 0.4 MG CAPS capsule TAKE ONE CAPSULE BY  MOUTH ONCE DAILY IN THE EVENING (Patient taking differently: TAKE ONE CAPSULE BY MOUTH ONCE DAILY BEFORE SUPPER) 30 capsule 2  . [DISCONTINUED] carbidopa-levodopa (SINEMET CR) 25-100 MG per tablet Take by mouth. 1/2 tablet at bedtime      No current facility-administered medications on file prior to visit.     BP 126/80 (BP Location: Left Arm, Patient Position: Sitting, Cuff Size: Normal)   Pulse 70   Temp 98.1 F (36.7 C) (Oral)   Resp 20   Ht 5\' 11"  (1.803 m)   Wt 203 lb 6.1 oz (92.3 kg)   SpO2 98%   BMI 28.37 kg/m     Review of Systems  Constitutional: Negative for appetite change, chills, fatigue and fever.  HENT: Negative for congestion, dental problem, ear pain, hearing loss, sore throat, tinnitus, trouble swallowing and voice change.   Eyes: Positive for visual disturbance. Negative for pain and discharge.  Respiratory: Negative for cough, chest tightness, wheezing and stridor.   Cardiovascular: Negative for chest pain, palpitations and leg swelling.  Gastrointestinal: Negative for abdominal distention, abdominal pain, blood in stool,  constipation, diarrhea, nausea and vomiting.  Genitourinary: Negative for difficulty urinating, discharge, flank pain, genital sores, hematuria and urgency.  Musculoskeletal: Positive for gait problem. Negative for arthralgias, back pain, joint swelling, myalgias and neck stiffness.  Skin: Negative for rash.  Neurological: Negative for dizziness, syncope, speech difficulty, weakness, numbness and headaches.  Hematological: Negative for adenopathy. Does not bruise/bleed easily.  Psychiatric/Behavioral: Negative for behavioral problems and dysphoric mood. The patient is not nervous/anxious.        Objective:   Physical Exam  Constitutional: He is oriented to person, place, and time. He appears well-developed.  Blood pressure 110/70 without orthostatic change Blood pressure 126/80 on arrival  HENT:  Head: Normocephalic.  Right Ear: External ear normal.  Left Ear: External ear normal.  Eyes: Conjunctivae and EOM are normal.  Neck: Normal range of motion.  Cardiovascular: Normal rate and normal heart sounds.   Pulmonary/Chest: Breath sounds normal. He has no rales.  Abdominal: Bowel sounds are normal.  Musculoskeletal: Normal range of motion. He exhibits edema. He exhibits no tenderness.  Neurological: He is alert and oriented to person, place, and time.  Psychiatric: He has a normal mood and affect. His behavior is normal.          Assessment & Plan:   Status post recent hospital mission for possible TIA.  Negative neuro workup Chronic diastolic heart failure.  Appears fairly well compensated mild peripheral edema on furosemide.  Will check electrolytes and BNP.  Resume furosemide Monday, Wednesday, Friday.  Follow-up cardiology next month as scheduled coronary artery disease status post CABG Atrial fibrillation.  Continue anticoagulation  Follow-up here 6 months or as needed Low-salt diet recommended  Rogelia Boga

## 2016-01-25 NOTE — Telephone Encounter (Signed)
New message      Dr Rennis GoldenHilty put pt on furosemide 20mg  daily.  Pt was to have labs drawn at his PCP (Dr Amador CunasKwiatkowski) today.  Before labs were drawn, PCP told pt to take furosemide 20mg  only on Mon, Wed and Fridays.  Son is concerned that furosemide was decreased prior to labs being drawn.  Can you look at the labs and call son and let him know if furosemide need to be daily or keep it 3 times a week.  Please call sister---Amy Scott at [phone] with info----son has a dentist appt this afternoon and will not be available.

## 2016-01-25 NOTE — Progress Notes (Signed)
Pre visit review using our clinic review tool, if applicable. No additional management support is needed unless otherwise documented below in the visit note. 

## 2016-01-25 NOTE — Patient Instructions (Signed)
Limit your sodium (Salt) intake  Cardiology follow-up as scheduled  Return in 6 months for follow-up  

## 2016-01-26 NOTE — Telephone Encounter (Signed)
Spoke with patients son Corey Mcdonald, ok per DPR Patient was seen by Corey Mcdonald on 12/1 and was started on Furosemide 20 mg daily Furosemide 20 mg daily on D/C instructions from November hospitalization however patient was not taking prior to ov.  He started taking the Furosemide 20 mg daily on 12/3 and took on 12/4 Patient had follow up with PCP Corey Mcdonald and he decreased Furosemide to taking Monday, Wednesday, and Friday only Did confirm with son patient had not started Furosemide until 12/3 and only had 2 doses prior to ov with [facility]CP [facility] Son would like for Corey Mcdonald to review labs and give recommendations on what he would like for patient to do Patient still having some swelling but son stated he woke up Monday and stated he had the best nights sleep in a very long time. No other changes  Advised would be at least tomorrow for call back and he would like call after 2 pm if tomorrow  Will forward to Corey Mcdonald for review

## 2016-01-26 NOTE — Telephone Encounter (Signed)
New message    Pt son verbalized that he is returning call for rn about hs father

## 2016-01-27 NOTE — Telephone Encounter (Signed)
Spoke to patient's son.  He voiced understanding of indications to take lasix daily, follow up in January as scheduled. He wanted to report that the patient has been doing very well on the lasix - he is having much more energy and is walking a mile a day.  Also notes that patient takes med around 7am, used to be up during the night 3-4 times to urinate when not on medication, now is clearing excess fluid during the day with help of med and able to sleep uninterrupted through the night. Son aware to call if new concerns or problems prior to appt. Voiced thanks for call.

## 2016-01-27 NOTE — Telephone Encounter (Signed)
I advised him to take 20 mg lasix daily - he is still mildly volume overloaded, BNP is improved but remains elevated. Would advise daily lasix 20 mg. I will reassess in 1 month.  Dr. HRexene Edison

## 2016-01-27 NOTE — Telephone Encounter (Signed)
Attempted to retry patient, phone rings w/o VM pickup or answer.

## 2016-01-27 NOTE — Telephone Encounter (Signed)
I called patient, he confirmed son will be there this afternoon to speak w me. Patient notes his memory isn't good and he'd prefer me to discuss care/med changes w son. Reminder for callback set in epic.

## 2016-02-02 ENCOUNTER — Encounter: Payer: Self-pay | Admitting: Vascular Surgery

## 2016-02-08 ENCOUNTER — Ambulatory Visit (HOSPITAL_COMMUNITY)
Admission: RE | Admit: 2016-02-08 | Discharge: 2016-02-08 | Disposition: A | Discharge status: Home / Self Care | Payer: Medicare Other | Source: Ambulatory Visit | Attending: Vascular Surgery | Admitting: Vascular Surgery

## 2016-02-08 DIAGNOSIS — Z95828 Presence of other vascular implants and grafts: Secondary | ICD-10-CM | POA: Diagnosis not present

## 2016-02-08 DIAGNOSIS — I714 Abdominal aortic aneurysm, without rupture, unspecified: Secondary | ICD-10-CM

## 2016-02-09 ENCOUNTER — Ambulatory Visit (INDEPENDENT_AMBULATORY_CARE_PROVIDER_SITE_OTHER): Payer: Medicare Other | Admitting: Vascular Surgery

## 2016-02-09 ENCOUNTER — Other Ambulatory Visit: Payer: Self-pay | Admitting: Internal Medicine

## 2016-02-09 ENCOUNTER — Encounter: Payer: Self-pay | Admitting: Vascular Surgery

## 2016-02-09 VITALS — BP 116/69 | HR 92 | Temp 97.4°F | Resp 20 | Ht 71.0 in | Wt 204.9 lb

## 2016-02-09 DIAGNOSIS — I714 Abdominal aortic aneurysm, without rupture, unspecified: Secondary | ICD-10-CM

## 2016-02-09 NOTE — Progress Notes (Signed)
[facility]  Patient name: Corey Mcdonald MRN: [id number] DOB: [birth year] Sex: male  REASON FOR VISIT: follow-up s/p EVAR  HPI: Corey CaroliCarl Mcdonald is a 82 y.o. male who presents for follow-up status post EVAR in 2012 by Dr. Arbie CookeyEarly. He was last seen in the office in 2015. His CT scan at that time showed stable positioning of the stent graft with no evidence of endoleak. He denies any abdominal or back pain. He lives at an independent living center and is doing well. He stays very active. He does complain of continued worsening vision.   Last month, he was hospitalized for left sided weakness. Work-up was negative for stroke. His left sided weakness quickly resolved. He was hospitalized for GI bleed in December 2016. The etiology of this was never found. He is on Eliquis is for atrial fibrillation.  Past Medical History:  Diagnosis Date  . AAA (abdominal aortic aneurysm) (HCC)   . ABDOMINAL AORTIC ANEURYSM   . Anemia January 2012   admitted with acute MI March 15, 2010 and has significant acute blood loss anemia requiring transfusions of two units of packed RBCs.  Status post upper endoscopy March 22, 2010 without High-Risk bleeding lesion.  History of peptic ulcer disease  . Atrial fibrillation (HCC)   . BENIGN PROSTATIC HYPERTROPHY, HX OF    . COLONIC POLYPS, HX OF   . CORONARY ARTERY DISEASE    non-ST segment MI, March 15, 2010  . History of Doppler ultrasound 05/2011   h/o claudication, stable ABIs  . History of nuclear stress test 2007   persantine; normal, low risk   . HYPERLIPIDEMIA   . HYPERTENSION   . MACULAR DEGENERATION   . Myocardial infarction 02/2010  . PAD (peripheral artery disease) (HCC)   . PEPTIC ULCER DISEASE, HX OF   . PVD (peripheral vascular disease) (HCC)   . RESTLESS LEGS SYNDROME   . SLEEP APNEA   . VERTIGO     Family History  Problem Relation Age of Onset  . Heart disease Mother   . Heart disease Father   . Stroke Father     . Heart disease Sister   . Macular degeneration Sister   . Heart disease Sister   . Stroke Brother   . Hypertension Sister   . Heart Problems Sister     x2    SOCIAL HISTORY: Social History  Substance Use Topics  . Smoking status: Former Smoker    Packs/day: 1.00    Years: 40.00    Types: Cigarettes    Quit date: 02/22/1987  . Smokeless tobacco: Never Used  . Alcohol use No    Allergies  Allergen Reactions  . Cilostazol Other (See Comments)    Adverse reaction per patient - leg cramps  . Doxazosin Other (See Comments)    Leg cramps    Current Outpatient Prescriptions  Medication Sig Dispense Refill  . acetaminophen (TYLENOL) 325 MG tablet Take 650 mg by mouth every 6 (six) hours as needed for moderate pain.     Marland Kitchen. atorvastatin (LIPITOR) 20 MG tablet TAKE ONE TABLET BY MOUTH ONCE DAILY 90 tablet 1  . docusate sodium (COLACE) 100 MG capsule Take 100 mg by mouth daily before supper.     Marland Kitchen. ELIQUIS 5 MG TABS tablet TAKE ONE TABLET BY MOUTH TWICE DAILY 60 tablet 3  . ferrous gluconate (FERGON) 240 (27 FE) MG tablet Take 1 tablet (240 mg total) by mouth 3 (three) times daily with meals.    .Marland Kitchen  furosemide (LASIX) 20 MG tablet 1 tablet Monday, Wednesday, Friday only 30 tablet 6  . metoprolol tartrate (LOPRESSOR) 25 MG tablet Take 25 mg by mouth 2 (two) times daily.    . Multiple Vitamin (MULTIVITAMIN WITH MINERALS) TABS tablet Take 1 tablet by mouth daily.    . nitroGLYCERIN (NITROSTAT) 0.4 MG SL tablet Place 1 tablet (0.4 mg total) under the tongue every 5 (five) minutes as needed. FOR CHEST PAIN (Patient taking differently: Place 0.4 mg under the tongue every 5 (five) minutes as needed for chest pain. ) 25 tablet 6  . pantoprazole (PROTONIX) 40 MG tablet TAKE ONE TABLET BY MOUTH ONCE DAILY (Patient taking differently: TAKE ONE TABLET BY MOUTH ONCE DAILY BEFORE SUPPER) 60 tablet 2  . PRESCRIPTION MEDICATION Inhale into the lungs at bedtime. CPAP - pressure is 10    . tamsulosin  (FLOMAX) 0.4 MG CAPS capsule TAKE ONE CAPSULE BY MOUTH ONCE DAILY IN THE EVENING (Patient taking differently: TAKE ONE CAPSULE BY MOUTH ONCE DAILY BEFORE SUPPER) 30 capsule 2   No current facility-administered medications for this visit.     REVIEW OF SYSTEMS:  [X]  denotes positive finding, [ ]  denotes negative finding Cardiac  Comments:  Chest pain or chest pressure:    Shortness of breath upon exertion:    Short of breath when lying flat:    Irregular heart rhythm: x Atrial fibrillation      Vascular    Pain in calf, thigh, or hip brought on by ambulation:    Pain in feet at night that wakes you up from your sleep:     Blood clot in your veins:    Leg swelling:  x       Pulmonary    Oxygen at home:    Productive cough:     Wheezing:         Neurologic    Sudden weakness in arms or legs:     Sudden numbness in arms or legs:     Sudden onset of difficulty speaking or slurred speech:    Temporary loss of vision in one eye:     Problems with dizziness:         Gastrointestinal    Blood in stool:     Vomited blood:         Genitourinary    Burning when urinating:     Blood in urine:        Psychiatric    Major depression:         Hematologic    Bleeding problems:    Problems with blood clotting too easily:        Skin    Rashes or ulcers:        Constitutional    Fever or chills:      PHYSICAL EXAM: Vitals:   02/09/16 1513  BP: 116/69  Pulse: 92  Resp: 20  Temp: 97.4 F (36.3 C)  TempSrc: Oral  SpO2: 94%  Weight: 204 lb 14.4 oz (92.9 kg)  Height: 5\' 11"  (1.803 m)    GENERAL: The patient is a well-nourished male, in no acute distress. The vital signs are documented above. CARDIAC: Irregularly irregular. VASCULAR: 2+ radial pulses bilaterally. Feet are warm and well perfused. PULMONARY: There is good air exchange bilaterally without wheezing or rales. ABDOMEN: Soft and non-tender. No pulsatile mass palpated. MUSCULOSKELETAL: Mild swelling bilateral  lower extremities. NEUROLOGIC: No focal weakness or paresthesias are detected. SKIN: There are no ulcers or rashes noted. PSYCHIATRIC:  The patient has a normal affect.  DATA:  EVAR duplex 02/08/2016  Max diameter of 6 x 6 cm  Previously on CTA (2015) 5.8 x 6.5 cm  MEDICAL ISSUES: Status post stent graft repair large infrarenal abdominal aortic aneurysm  The patient's native aortic sac has been stable since his last CT scan 2 years ago. He overall is doing well. He will follow-up again in 2 years with an EVAR duplex.  Maris BergerKimberly Trinh, PA-C [facility]

## 2016-02-09 NOTE — Addendum Note (Signed)
Addended by: Burton ApleyPETTY, Yichen Gilardi A on: 02/09/2016 04:26 PM   Modules accepted: Orders

## 2016-02-21 ENCOUNTER — Other Ambulatory Visit: Payer: Self-pay | Admitting: Internal Medicine

## 2016-02-23 NOTE — Telephone Encounter (Signed)
Rx has been sent to the pharmacy electronically. ° °

## 2016-03-04 ENCOUNTER — Encounter: Payer: Self-pay | Admitting: Internal Medicine

## 2016-03-04 ENCOUNTER — Ambulatory Visit (INDEPENDENT_AMBULATORY_CARE_PROVIDER_SITE_OTHER): Payer: Medicare Other | Admitting: Internal Medicine

## 2016-03-04 VITALS — BP 126/72 | HR 89 | Ht 71.0 in | Wt 205.6 lb

## 2016-03-04 DIAGNOSIS — I714 Abdominal aortic aneurysm, without rupture, unspecified: Secondary | ICD-10-CM

## 2016-03-04 DIAGNOSIS — I5032 Chronic diastolic (congestive) heart failure: Secondary | ICD-10-CM

## 2016-03-04 DIAGNOSIS — I10 Essential (primary) hypertension: Secondary | ICD-10-CM

## 2016-03-04 DIAGNOSIS — Z951 Presence of aortocoronary bypass graft: Secondary | ICD-10-CM

## 2016-03-04 DIAGNOSIS — Z95828 Presence of other vascular implants and grafts: Secondary | ICD-10-CM

## 2016-03-04 DIAGNOSIS — E785 Hyperlipidemia, unspecified: Secondary | ICD-10-CM
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
## 2016-03-04 NOTE — Patient Instructions (Signed)
Your physician wants you to follow-up in: 6 months with Dr. Hilty. You will receive a reminder letter in the mail two months in advance. If you don't receive a letter, please call our office to schedule the follow-up appointment.    

## 2016-03-15 DIAGNOSIS — I10 Essential (primary) hypertension: Secondary | ICD-10-CM | POA: Diagnosis not present

## 2016-03-15 DIAGNOSIS — I2510 Atherosclerotic heart disease of native coronary artery without angina pectoris: Secondary | ICD-10-CM | POA: Diagnosis not present

## 2016-03-15 DIAGNOSIS — G4733 Obstructive sleep apnea (adult) (pediatric): Secondary | ICD-10-CM | POA: Diagnosis not present

## 2016-03-15 DIAGNOSIS — G4730 Sleep apnea, unspecified: Secondary | ICD-10-CM | POA: Diagnosis not present

## 2016-05-03 ENCOUNTER — Other Ambulatory Visit: Payer: Self-pay | Admitting: Cardiology

## 2016-05-10 ENCOUNTER — Other Ambulatory Visit: Payer: Self-pay | Admitting: Internal Medicine

## 2016-06-22 ENCOUNTER — Other Ambulatory Visit: Payer: Self-pay | Admitting: Internal Medicine

## 2016-08-10 ENCOUNTER — Other Ambulatory Visit: Payer: Self-pay | Admitting: Internal Medicine

## 2016-08-30 ENCOUNTER — Other Ambulatory Visit: Payer: Self-pay | Admitting: Internal Medicine

## 2016-09-02 ENCOUNTER — Ambulatory Visit: Payer: Medicare Other | Admitting: Internal Medicine

## 2016-10-14 ENCOUNTER — Ambulatory Visit: Payer: Medicare Other | Admitting: Internal Medicine

## 2016-10-14 ENCOUNTER — Encounter: Payer: Self-pay | Admitting: Internal Medicine

## 2016-10-14 ENCOUNTER — Ambulatory Visit (INDEPENDENT_AMBULATORY_CARE_PROVIDER_SITE_OTHER): Payer: Medicare Other | Admitting: Internal Medicine

## 2016-10-14 VITALS — BP 122/62 | HR 86 | Temp 97.9°F | Ht 71.0 in | Wt 206.0 lb

## 2016-10-14 DIAGNOSIS — I5043 Acute on chronic combined systolic (congestive) and diastolic (congestive) heart failure: Secondary | ICD-10-CM

## 2016-10-14 DIAGNOSIS — N183 Chronic kidney disease, stage 3 unspecified: Secondary | ICD-10-CM

## 2016-10-14 DIAGNOSIS — D62 Acute posthemorrhagic anemia: Secondary | ICD-10-CM

## 2016-10-14 DIAGNOSIS — I10 Essential (primary) hypertension: Secondary | ICD-10-CM | POA: Diagnosis not present

## 2016-10-14 DIAGNOSIS — R600 Localized edema: Secondary | ICD-10-CM

## 2016-10-14 DIAGNOSIS — I480 Paroxysmal atrial fibrillation: Secondary | ICD-10-CM

## 2016-10-14 DIAGNOSIS — I5032 Chronic diastolic (congestive) heart failure: Secondary | ICD-10-CM | POA: Diagnosis not present

## 2016-10-14 DIAGNOSIS — E785 Hyperlipidemia, unspecified: Secondary | ICD-10-CM

## 2016-10-14 LAB — CBC WITH DIFFERENTIAL/PLATELET
Basophils Absolute: 0 10*3/uL (ref 0.0–0.1)
Basophils Relative: 0.4 % (ref 0.0–3.0)
EOS ABS: 0.2 10*3/uL (ref 0.0–0.7)
Eosinophils Relative: 2 % (ref 0.0–5.0)
HCT: 39.1 % (ref 39.0–52.0)
HEMOGLOBIN: 12.9 g/dL — AB (ref 13.0–17.0)
LYMPHS ABS: 0.9 10*3/uL (ref 0.7–4.0)
LYMPHS PCT: 10.6 % — AB (ref 12.0–46.0)
MCHC: 32.9 g/dL (ref 30.0–36.0)
MCV: 92.6 fl (ref 78.0–100.0)
MONO ABS: 0.9 10*3/uL (ref 0.1–1.0)
Monocytes Relative: 10.7 % (ref 3.0–12.0)
NEUTROS PCT: 76.3 % (ref 43.0–77.0)
Neutro Abs: 6.8 10*3/uL (ref 1.4–7.7)
Platelets: 242 10*3/uL (ref 150.0–400.0)
RBC: 4.23 Mil/uL (ref 4.22–5.81)
RDW: 14.5 % (ref 11.5–15.5)
WBC: 8.9 10*3/uL (ref 4.0–10.5)

## 2016-10-14 LAB — COMPREHENSIVE METABOLIC PANEL
ALBUMIN: 4 g/dL (ref 3.5–5.2)
ALK PHOS: 51 U/L (ref 39–117)
ALT: 18 U/L (ref 0–53)
AST: 17 U/L (ref 0–37)
BILIRUBIN TOTAL: 0.6 mg/dL (ref 0.2–1.2)
BUN: 30 mg/dL — ABNORMAL HIGH (ref 6–23)
CALCIUM: 9.9 mg/dL (ref 8.4–10.5)
CO2: 31 mEq/L (ref 19–32)
CREATININE: 1.48 mg/dL (ref 0.40–1.50)
Chloride: 100 mEq/L (ref 96–112)
GFR: 47.37 mL/min — ABNORMAL LOW (ref >60.00)
Glucose, Bld: 92 mg/dL (ref 70–99)
Potassium: 5.1 mEq/L (ref 3.5–5.1)
Sodium: 138 mEq/L (ref 135–145)
Total Protein: 7.2 g/dL (ref 6.0–8.3)

## 2016-10-14 LAB — LIPID PANEL
Cholesterol: 156 mg/dL (ref 0–200)
HDL: 37.4 mg/dL — ABNORMAL LOW (ref >39.00)
LDL CALC: 92 mg/dL (ref 0–99)
NonHDL: 118.27
Total CHOL/HDL Ratio: 4
Triglycerides: 131 mg/dL (ref 0.0–149.0)
VLDL: 26.2 mg/dL (ref 0.0–40.0)

## 2016-10-14 LAB — TSH: TSH: 3.52 u[IU]/mL (ref 0.35–4.50)

## 2016-10-14 LAB — BRAIN NATRIURETIC PEPTIDE: Pro B Natriuretic peptide (BNP): 172 pg/mL — ABNORMAL HIGH (ref 0.0–100.0)

## 2016-10-14 NOTE — Patient Instructions (Addendum)
Limit your sodium (Salt) intake  You  may move around, but avoid painful motions and activities.  Apply heat  to the sore area for 15 to 20 minutes 3 or 4 times daily for the next two to 3 days.  Cardiology follow-up as scheduled  Take [phone] mg of Tylenol every 6 hours as needed for pain relief or fever.  Avoid taking more than 3000 mg in a 24-hour period (  This may cause liver damage).

## 2016-10-14 NOTE — Progress Notes (Signed)
Subjective:    Patient ID: Corey Mcdonald, male    DOB: 1923-06-23, 83 y.o.   MRN: [id number]  HPI  83 year old patient who is seen today for follow-up. He is seen very infrequently and is followed by cardiology.  He does have a follow-up visit scheduled in 7 days He presents today with a chief complaint of left flank discomfort.  This is aggravated by certain movements.  No constitutional complaints.  No radicular symptoms Symptoms have been present for 2 or 3 days and seem to be improving.  He was concerned about a possible kidney problem He has a history of chronic atrial fibrillation and remains on chronic anticoagulation.  He has a history of chronic diastolic heart failure.  He has had some slight increase in pedal edema, but otherwise no change  Wt Readings from Last 3 Encounters:  10/14/16 206 lb (93.4 kg)  03/04/16 205 lb 9.6 oz (93.3 kg)  02/09/16 204 lb 14.4 oz (92.9 kg)   Denies any exertional chest pain. His main complaint is frequent urination often every 2 hours  Past Medical History:  Diagnosis Date  . AAA (abdominal aortic aneurysm) (HCC)   . ABDOMINAL AORTIC ANEURYSM   . Anemia January 2012   admitted with acute MI March 15, 2010 and has significant acute blood loss anemia requiring transfusions of two units of packed RBCs.  Status post upper endoscopy March 22, 2010 without High-Risk bleeding lesion.  History of peptic ulcer disease  . Atrial fibrillation (HCC)   . BENIGN PROSTATIC HYPERTROPHY, HX OF    . COLONIC POLYPS, HX OF   . CORONARY ARTERY DISEASE    non-ST segment MI, March 15, 2010  . History of Doppler ultrasound 05/2011   h/o claudication, stable ABIs  . History of nuclear stress test 2007   persantine; normal, low risk   . HYPERLIPIDEMIA   . HYPERTENSION   . MACULAR DEGENERATION   . Myocardial infarction (HCC) 02/2010  . PAD (peripheral artery disease) (HCC)   . PEPTIC ULCER DISEASE, HX OF   . PVD (peripheral vascular disease) (HCC)   .  RESTLESS LEGS SYNDROME   . SLEEP APNEA   . VERTIGO      Social History   Social History  . Marital status: Married    Spouse name: N/A  . Number of children: 2  . Years of education: N/A   Occupational History  . Not on file.   Social History Main Topics  . Smoking status: Former Smoker    Packs/day: 1.00    Years: 40.00    Types: Cigarettes    Quit date: 02/22/1987  . Smokeless tobacco: Never Used  . Alcohol use No  . Drug use: No  . Sexual activity: Not on file   Other Topics Concern  . Not on file   Social History Narrative  . No narrative on file    Past Surgical History:  Procedure Laterality Date  . ABDOMINAL AORTIC ANEURYSM REPAIR     stenting   . CARDIAC CATHETERIZATION  02/2010   r/t NSTEMI; loss of SVG to RCA  . COLONOSCOPY WITH PROPOFOL N/A 02/04/2015   Procedure: COLONOSCOPY WITH PROPOFOL;  Surgeon: Charlott Rakes, MD;  Location: [facility] ENDOSCOPY;  Service: Endoscopy;  Laterality: N/A;  . CORONARY ARTERY BYPASS GRAFT  12/1995   x5; LIMA to ALD, vein graft to RCA (now occluded),SVG to OM, SVG to diagonal  . ESOPHAGOGASTRODUODENOSCOPY N/A 01/28/2015   Procedure: ESOPHAGOGASTRODUODENOSCOPY (EGD);  Surgeon: Vida Rigger, MD;  Location: [facility] ENDOSCOPY;  Service: Endoscopy;  Laterality: N/A;  . INGUINAL HERNIA REPAIR     Left inguinal  . TRANSTHORACIC ECHOCARDIOGRAM  04/2011   EF 55-60%; mild LVH; grade 1 diastolic dysfunction    Family History  Problem Relation Age of Onset  . Heart disease Mother   . Heart disease Father   . Stroke Father   . Heart disease Sister   . Macular degeneration Sister   . Heart disease Sister   . Stroke Brother   . Hypertension Sister   . Heart Problems Sister        x2    Allergies  Allergen Reactions  . Cilostazol Other (See Comments)    Adverse reaction per patient - leg cramps  . Doxazosin Other (See Comments)    Leg cramps    Current Outpatient Prescriptions on File Prior to Visit  Medication Sig Dispense Refill    . acetaminophen (TYLENOL) 325 MG tablet Take 650 mg by mouth every 6 (six) hours as needed for moderate pain.     Marland Kitchen atorvastatin (LIPITOR) 20 MG tablet TAKE ONE TABLET BY MOUTH ONCE DAILY. 90 tablet 1  . docusate sodium (COLACE) 100 MG capsule Take 100 mg by mouth daily before supper.     Marland Kitchen ELIQUIS 5 MG TABS tablet TAKE ONE TABLET BY MOUTH TWICE DAILY 60 tablet 5  . ferrous gluconate (FERGON) 240 (27 FE) MG tablet Take 1 tablet (240 mg total) by mouth 3 (three) times daily with meals. (Patient taking differently: Take 240 mg by mouth at bedtime. )    . furosemide (LASIX) 20 MG tablet TAKE ONE TABLET BY MOUTH ONCE DAILY 90 tablet 0  . metoprolol tartrate (LOPRESSOR) 25 MG tablet Take 25 mg by mouth 2 (two) times daily.    . Multiple Vitamin (MULTIVITAMIN WITH MINERALS) TABS tablet Take 1 tablet by mouth daily.    . nitroGLYCERIN (NITROSTAT) 0.4 MG SL tablet Place 1 tablet (0.4 mg total) under the tongue every 5 (five) minutes as needed. FOR CHEST PAIN (Patient taking differently: Place 0.4 mg under the tongue every 5 (five) minutes as needed for chest pain. ) 25 tablet 6  . pantoprazole (PROTONIX) 40 MG tablet TAKE ONE TABLET BY MOUTH ONCE DAILY 60 tablet 4  . PRESCRIPTION MEDICATION Inhale into the lungs at bedtime. CPAP - pressure is 10    . tamsulosin (FLOMAX) 0.4 MG CAPS capsule TAKE 1 CAPSULE BY MOUTH ONCE DAILY IN THE EVENING 90 capsule 2  . [DISCONTINUED] carbidopa-levodopa (SINEMET CR) 25-100 MG per tablet Take by mouth. 1/2 tablet at bedtime      No current facility-administered medications on file prior to visit.     BP 122/62 (BP Location: Left Arm, Patient Position: Sitting, Cuff Size: Normal)   Pulse 86   Temp 97.9 F (36.6 C) (Oral)   Ht 5\' 11"  (1.803 m)   Wt 206 lb (93.4 kg)   SpO2 96%   BMI 28.73 kg/m     Review of Systems  Constitutional: Negative for appetite change, chills, fatigue and fever.  HENT: Negative for congestion, dental problem, ear pain, hearing loss,  sore throat, tinnitus, trouble swallowing and voice change.   Eyes: Positive for visual disturbance. Negative for pain and discharge.  Respiratory: Negative for cough, chest tightness, wheezing and stridor.   Cardiovascular: Positive for leg swelling. Negative for chest pain and palpitations.  Gastrointestinal: Negative for abdominal distention, abdominal pain, blood in stool, constipation, diarrhea, nausea and vomiting.  Genitourinary: Negative  for difficulty urinating, discharge, flank pain, genital sores, hematuria and urgency.  Musculoskeletal: Positive for back pain and gait problem. Negative for arthralgias, joint swelling, myalgias and neck stiffness.  Skin: Negative for rash.  Neurological: Positive for weakness. Negative for dizziness, syncope, speech difficulty, numbness and headaches.  Hematological: Negative for adenopathy. Does not bruise/bleed easily.  Psychiatric/Behavioral: Negative for behavioral problems and dysphoric mood. The patient is not nervous/anxious.        Objective:   Physical Exam  Constitutional: He is oriented to person, place, and time. He appears well-developed.  Blood pressure 120/70  HENT:  Head: Normocephalic.  Right Ear: External ear normal.  Left Ear: External ear normal.  Eyes: Conjunctivae and EOM are normal.  Neck: Normal range of motion.  Cardiovascular: Normal rate.   Murmur heard. Grade 3/6 harsh systolic murmur Rales, left base  Pulmonary/Chest: He has rales.  Abdominal: Bowel sounds are normal.  Musculoskeletal: Normal range of motion. He exhibits edema. He exhibits no tenderness.  Plus 1 pedal edema Tenderness to palpation over the left lumbar area  Neurological: He is alert and oriented to person, place, and time.  Psychiatric: He has a normal mood and affect. His behavior is normal.          Assessment & Plan:   Left lumbar back pain.  Suspect the muscle ligamentous.  He therapy and Tylenol recommended.  Pain seems fairly  minor and improving Chronic diastolic heart failure seems fairly well compensated, although slight pedal edema.  Cardiology follow-up scheduled for 7 days Chronic atrial fibrillation.  Controlled ventricular response.  Continue anticoagulation History of chronic kidney disease.  Will check renal indices History GI bleeding.  Check CBC Coronary artery disease.  Will review a lipid profile  Cardiology follow-up as scheduled Return here in 6 months or as needed  NIKE

## 2016-10-21 ENCOUNTER — Ambulatory Visit (INDEPENDENT_AMBULATORY_CARE_PROVIDER_SITE_OTHER): Payer: Medicare Other | Admitting: Internal Medicine

## 2016-10-21 ENCOUNTER — Encounter: Payer: Self-pay | Admitting: Internal Medicine

## 2016-10-21 VITALS — BP 130/60 | HR 92 | Ht 71.0 in | Wt 205.0 lb

## 2016-10-21 DIAGNOSIS — Z951 Presence of aortocoronary bypass graft: Secondary | ICD-10-CM

## 2016-10-21 DIAGNOSIS — I482 Chronic atrial fibrillation, unspecified: Secondary | ICD-10-CM

## 2016-10-21 DIAGNOSIS — I714 Abdominal aortic aneurysm, without rupture, unspecified: Secondary | ICD-10-CM

## 2016-10-21 DIAGNOSIS — I10 Essential (primary) hypertension: Secondary | ICD-10-CM | POA: Diagnosis not present

## 2016-10-21 NOTE — Patient Instructions (Signed)
Your physician wants you to follow-up in: 6 months with Dr. Hilty. You will receive a reminder letter in the mail two months in advance. If you don't receive a letter, please call our office to schedule the follow-up appointment.    
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
## 2016-11-01 ENCOUNTER — Other Ambulatory Visit: Payer: Self-pay | Admitting: Internal Medicine

## 2016-12-23 ENCOUNTER — Ambulatory Visit (INDEPENDENT_AMBULATORY_CARE_PROVIDER_SITE_OTHER): Payer: Medicare Other

## 2016-12-23 DIAGNOSIS — Z23 Encounter for immunization: Secondary | ICD-10-CM

## 2017-01-13 ENCOUNTER — Other Ambulatory Visit: Payer: Self-pay | Admitting: Cardiology

## 2017-01-16 NOTE — Telephone Encounter (Signed)
REFILL 

## 2017-01-20 ENCOUNTER — Encounter: Payer: Self-pay | Admitting: Adult Health

## 2017-01-20 ENCOUNTER — Ambulatory Visit: Payer: Medicare Other | Admitting: Adult Health

## 2017-01-20 DIAGNOSIS — Z9989 Dependence on other enabling machines and devices: Secondary | ICD-10-CM

## 2017-01-20 DIAGNOSIS — G4733 Obstructive sleep apnea (adult) (pediatric): Secondary | ICD-10-CM | POA: Diagnosis not present

## 2017-01-20 NOTE — Progress Notes (Signed)
 @Patient  ID: Corey Mcdonald, male    DOB: April 18, 1923, 83 y.o.   MRN: [id number]  Chief Complaint  Patient presents with  . Follow-up    OSA     Referring provider: Gordy SaversKwiatkowski, Peter F, MD  HPI: 83 yo male legally blind followed for obstructive sleep apnea Has atrial fibrillation on Eliquis  TEST   09/2014 showed severe OSA with AHI 43/hour with lowest desaturation of 81% Echo 12/2015 >EF 50-55%  01/20/2017 Follow up : OSA  Patient presents for a one year follow-up for sleep apnea.  Patient says he is doing very well on his CPAP machine.  He wears it every single night.  He never misses a night.  And if he takes a nap he uses it.  He feels rested with no significant daytime sleepiness. Download shows excellent compliance with average usage at 10 hours.  Positive leaks.  AHI 17/hr. results were similar to last year.  As his AHI was 15.  Patient is on CPAP 10 cm H2O.  He says pressure feels comfortable..    Allergies  Allergen Reactions  . Cilostazol Other (See Comments)    Adverse reaction per patient - leg cramps  . Doxazosin Other (See Comments)    Leg cramps    Immunization History  Administered Date(s) Administered  . Influenza Split 11/26/2010, 11/11/2011  . Influenza Whole 11/21/2005, 12/05/2006, 11/07/2007  . Influenza, High Dose Seasonal PF 11/22/2013, 12/16/2015, 12/23/2016  . Influenza,inj,Quad PF,6+ Mos 11/16/2012, 12/19/2014  . Pneumococcal Conjugate-13 07/12/2013  . Pneumococcal Polysaccharide-23 11/21/2005    Past Medical History:  Diagnosis Date  . AAA (abdominal aortic aneurysm) (HCC)   . ABDOMINAL AORTIC ANEURYSM   . Anemia January 2012   admitted with acute MI March 15, 2010 and has significant acute blood loss anemia requiring transfusions of two units of packed RBCs.  Status post upper endoscopy March 22, 2010 without High-Risk bleeding lesion.  History of peptic ulcer disease  . Atrial fibrillation (HCC)   . BENIGN PROSTATIC HYPERTROPHY, HX  OF    . COLONIC POLYPS, HX OF   . CORONARY ARTERY DISEASE    non-ST segment MI, March 15, 2010  . History of Doppler ultrasound 05/2011   h/o claudication, stable ABIs  . History of nuclear stress test 2007   persantine; normal, low risk   . HYPERLIPIDEMIA   . HYPERTENSION   . MACULAR DEGENERATION   . Myocardial infarction (HCC) 02/2010  . PAD (peripheral artery disease) (HCC)   . PEPTIC ULCER DISEASE, HX OF   . PVD (peripheral vascular disease) (HCC)   . RESTLESS LEGS SYNDROME   . SLEEP APNEA   . VERTIGO     Tobacco History: Social History   Tobacco Use  Smoking Status Former Smoker  . Packs/day: 1.00  . Years: 40.00  . Pack years: 40.00  . Types: Cigarettes  . Last attempt to quit: 02/22/1987  . Years since quitting: 29.9  Smokeless Tobacco Never Used   Counseling given: Not Answered   Outpatient Encounter Medications as of 01/20/2017  Medication Sig  . acetaminophen (TYLENOL) 325 MG tablet Take 650 mg by mouth every 6 (six) hours as needed for moderate pain.   Marland Kitchen. atorvastatin (LIPITOR) 20 MG tablet TAKE ONE TABLET BY MOUTH ONCE DAILY.  Marland Kitchen. docusate sodium (COLACE) 100 MG capsule Take 100 mg by mouth daily before supper.   Marland Kitchen. ELIQUIS 5 MG TABS tablet TAKE 1 TABLET BY MOUTH TWICE DAILY  . Ferrous Gluconate (IRON) 240 (27 Fe)  MG TABS Take 1 tablet by mouth daily.  . furosemide (LASIX) 20 MG tablet TAKE 1 TABLET BY MOUTH ONCE DAILY  . metoprolol tartrate (LOPRESSOR) 25 MG tablet TAKE ONE TABLET BY MOUTH TWICE DAILY  . Multiple Vitamin (MULTIVITAMIN WITH MINERALS) TABS tablet Take 1 tablet by mouth daily.  . nitroGLYCERIN (NITROSTAT) 0.4 MG SL tablet Place 1 tablet (0.4 mg total) under the tongue every 5 (five) minutes as needed. FOR CHEST PAIN (Patient taking differently: Place 0.4 mg under the tongue every 5 (five) minutes as needed for chest pain. )  . pantoprazole (PROTONIX) 40 MG tablet TAKE ONE TABLET BY MOUTH ONCE DAILY  . PRESCRIPTION MEDICATION Inhale into the lungs  at bedtime. CPAP - pressure is 10  . tamsulosin (FLOMAX) 0.4 MG CAPS capsule TAKE 1 CAPSULE BY MOUTH ONCE DAILY IN THE EVENING   No facility-administered encounter medications on file as of 01/20/2017.      Review of Systems  Constitutional:   No  weight loss, night sweats,  Fevers, chills, +fatigue, or  lassitude.  HEENT:   No headaches,  Difficulty swallowing,  Tooth/dental problems, or  Sore throat,                No sneezing, itching, ear ache, nasal congestion, post nasal drip,   CV:  No chest pain,  Orthopnea, PND, swelling in lower extremities, anasarca, dizziness, palpitations, syncope.   GI  No heartburn, indigestion, abdominal pain, nausea, vomiting, diarrhea, change in bowel habits, loss of appetite, bloody stools.   Resp: No shortness of breath with exertion or at rest.  No excess mucus, no productive cough,  No non-productive cough,  No coughing up of blood.  No change in color of mucus.  No wheezing.  No chest wall deformity  Skin: no rash or lesions.  GU: no dysuria, change in color of urine, no urgency or frequency.  No flank pain, no hematuria   MS:  No joint pain or swelling.  No decreased range of motion.  No back pain.    Physical Exam  BP 112/70 (BP Location: Left Arm, Cuff Size: Normal)   Pulse 84   Ht 5\' 11"  (1.803 m)   Wt 210 lb (95.3 kg)   SpO2 96%   BMI 29.29 kg/m   GEN: A/Ox3; pleasant , NAD,  Overweight    HEENT:  [place]/AT,  EACs-clear, TMs-wnl, NOSE-clear, THROAT-clear, no lesions, no postnasal drip or exudate noted. Class 3 MP airway   NECK:  Supple w/ fair ROM; no JVD; normal carotid impulses w/o bruits; no thyromegaly or nodules palpated; no lymphadenopathy.    RESP  Clear  P & A; w/o, wheezes/ rales/ or rhonchi. no accessory muscle use, no dullness to percussion  CARD:  RRR, no m/r/g, no peripheral edema, pulses intact, no cyanosis or clubbing.  GI:   Soft & nt; nml bowel sounds; no organomegaly or masses detected.   Musco: Warm bil, no  deformities or joint swelling noted.   Neuro: alert, no focal deficits noted.    Skin: Warm, no lesions or rashes    Lab Results:   BNP   Imaging: No results found.   Assessment & Plan:   OSA on CPAP Doing well on CPAP  Will change to auto set to see if can improved control   Plan  Patient Instructions  Continue on CPAP At bedtime   Keep up good work  Work on [employer] .  Change CPAP pressure 10 to 15cmH2O.  Download  in 1 month  Follow up Dr. Vassie LollAlva  In 1 year. And As needed         Rubye Oaksammy Parrett, NP 01/20/2017

## 2017-01-20 NOTE — Patient Instructions (Signed)
Continue on CPAP At bedtime   Keep up good work  Work on [employer] weight .  Change CPAP pressure 10 to 15cmH2O.  Download in 1 month  Follow up Dr. Vassie LollAlva  In 1 year. And As needed

## 2017-01-20 NOTE — Progress Notes (Signed)
Reviewed & agree with plan  

## 2017-01-20 NOTE — Addendum Note (Signed)
Addended by: Boone MasterJONES, JESSICA E on: 01/20/2017 03:23 PM   Modules accepted: Orders

## 2017-01-20 NOTE — Assessment & Plan Note (Signed)
Doing well on CPAP  Will change to auto set to see if can improved control   Plan  Patient Instructions  Continue on CPAP At bedtime   Keep up good work  Work on [employer] weight .  Change CPAP pressure 10 to 15cmH2O.  Download in 1 month  Follow up Dr. Vassie LollAlva  In 1 year. And As needed

## 2017-01-23 ENCOUNTER — Telehealth: Payer: Self-pay | Admitting: Pulmonary Disease

## 2017-01-23 DIAGNOSIS — G4733 Obstructive sleep apnea (adult) (pediatric): Secondary | ICD-10-CM

## 2017-01-23 NOTE — Telephone Encounter (Signed)
Order has been sent to [facility] °

## 2017-01-23 NOTE — Telephone Encounter (Signed)
Spoke with Core[facility]cdonald at [facility], the order has to say "2 week auto titration" so I placed another order. PCC's FYI \

## 2017-03-06 ENCOUNTER — Other Ambulatory Visit: Payer: Self-pay | Admitting: Internal Medicine

## 2017-04-07 DIAGNOSIS — Z23 Encounter for immunization: Secondary | ICD-10-CM | POA: Diagnosis not present

## 2017-04-07 DIAGNOSIS — L570 Actinic keratosis: Secondary | ICD-10-CM | POA: Diagnosis not present

## 2017-04-07 DIAGNOSIS — L821 Other seborrheic keratosis: Secondary | ICD-10-CM | POA: Diagnosis not present

## 2017-04-12 ENCOUNTER — Other Ambulatory Visit: Payer: Self-pay | Admitting: Internal Medicine

## 2017-05-11 ENCOUNTER — Other Ambulatory Visit: Payer: Self-pay | Admitting: Internal Medicine

## 2017-05-12 NOTE — Telephone Encounter (Signed)
Rx has been sent to the pharmacy electronically. ° °

## 2017-06-23 ENCOUNTER — Ambulatory Visit: Payer: Medicare Other | Admitting: Adult Health

## 2017-06-23 ENCOUNTER — Encounter: Payer: Self-pay | Admitting: Adult Health

## 2017-06-23 VITALS — BP 110/62 | HR 84 | Temp 97.9°F | Wt 207.0 lb

## 2017-06-23 DIAGNOSIS — R4189 Other symptoms and signs involving cognitive functions and awareness: Secondary | ICD-10-CM | POA: Diagnosis not present

## 2017-06-23 LAB — POCT URINALYSIS DIPSTICK
Bilirubin, UA: NEGATIVE
Blood, UA: NEGATIVE
GLUCOSE UA: NEGATIVE
Ketones, UA: NEGATIVE
LEUKOCYTES UA: NEGATIVE
NITRITE UA: NEGATIVE
Protein, UA: NEGATIVE
Spec Grav, UA: 1.015 (ref 1.010–1.025)
Urobilinogen, UA: 0.2 E.U./dL
pH, UA: 7 (ref 5.0–8.0)

## 2017-06-23 LAB — CBC WITH DIFFERENTIAL/PLATELET
Basophils Absolute: 0.1 10*3/uL (ref 0.0–0.1)
Basophils Relative: 0.8 % (ref 0.0–3.0)
EOS ABS: 0.1 10*3/uL (ref 0.0–0.7)
EOS PCT: 1.7 % (ref 0.0–5.0)
HEMATOCRIT: 36.4 % — AB (ref 39.0–52.0)
HEMOGLOBIN: 12 g/dL — AB (ref 13.0–17.0)
LYMPHS PCT: 8.9 % — AB (ref 12.0–46.0)
Lymphs Abs: 0.7 10*3/uL (ref 0.7–4.0)
MCHC: 33 g/dL (ref 30.0–36.0)
MCV: 93 fl (ref 78.0–100.0)
MONOS PCT: 11.3 % (ref 3.0–12.0)
Monocytes Absolute: 0.9 10*3/uL (ref 0.1–1.0)
Neutro Abs: 6.2 10*3/uL (ref 1.4–7.7)
Neutrophils Relative %: 77.3 % — ABNORMAL HIGH (ref 43.0–77.0)
Platelets: 243 10*3/uL (ref 150.0–400.0)
RBC: 3.91 Mil/uL — ABNORMAL LOW (ref 4.22–5.81)
RDW: 15.3 % (ref 11.5–15.5)
WBC: 7.9 10*3/uL (ref 4.0–10.5)

## 2017-06-23 LAB — BASIC METABOLIC PANEL
BUN: 29 mg/dL — AB (ref 6–23)
CO2: 32 mEq/L (ref 19–32)
Calcium: 9.6 mg/dL (ref 8.4–10.5)
Chloride: 99 mEq/L (ref 96–112)
Creatinine, Ser: 1.85 mg/dL — ABNORMAL HIGH (ref 0.40–1.50)
GFR: 36.56 mL/min — AB (ref >60.00)
GLUCOSE: 87 mg/dL (ref 70–99)
POTASSIUM: 4.2 meq/L (ref 3.5–5.1)
Sodium: 138 mEq/L (ref 135–145)

## 2017-06-23 NOTE — Progress Notes (Signed)
Subjective:    Patient ID: Corey Mcdonald, male    DOB: 11-May-1923, 84 y.o.   MRN: [id number]  HPI  84 year old male who  has a past medical history of AAA (abdominal aortic aneurysm) (HCC), ABDOMINAL AORTIC ANEURYSM, Anemia (January 2012), Atrial fibrillation (HCC), BENIGN PROSTATIC HYPERTROPHY, HX OF ( ), COLONIC POLYPS, HX OF, CORONARY ARTERY DISEASE, History of Doppler ultrasound (05/2011), History of nuclear stress test (2007), HYPERLIPIDEMIA, HYPERTENSION, MACULAR DEGENERATION, Myocardial infarction (HCC) (02/2010), PAD (peripheral artery disease) (HCC), PEPTIC ULCER DISEASE, HX OF, PVD (peripheral vascular disease) (HCC), RESTLESS LEGS SYNDROME, SLEEP APNEA, and VERTIGO.  He is a patient of Dr. Kirtland Bouchard who I am seeing today for an acute visit. His daughter is with him at this visit to help provide history.   She reports that over the last month Mister has been more confused. She reports that she has noticed that he will often call her and he " wont know what to do". She reports statements such as " I do not know how to use the microwave", or " How do I make my dinner."  Kenniel reports " I just cannot remember how to do things anymore."  His daughter reports that his causes anxiety and frustration on his part.   She also reports that he will fall asleep more suddenly and will wake up hours later. This often happens in the early evening and he will sleep till midnight in his chair, when he wakes up he often does not know what time it is.   There has not been any wondering behavior, getting lost, fevers, or UTI like symptoms.   Daughter reports that he was showered, dressed, and ready for his appointment when she arrived this morning.   Review of Systems See HPI   Past Medical History:  Diagnosis Date  . AAA (abdominal aortic aneurysm) (HCC)   . ABDOMINAL AORTIC ANEURYSM   . Anemia January 2012   admitted with acute MI March 15, 2010 and has significant acute blood loss anemia requiring  transfusions of two units of packed RBCs.  Status post upper endoscopy March 22, 2010 without High-Risk bleeding lesion.  History of peptic ulcer disease  . Atrial fibrillation (HCC)   . BENIGN PROSTATIC HYPERTROPHY, HX OF    . COLONIC POLYPS, HX OF   . CORONARY ARTERY DISEASE    non-ST segment MI, March 15, 2010  . History of Doppler ultrasound 05/2011   h/o claudication, stable ABIs  . History of nuclear stress test 2007   persantine; normal, low risk   . HYPERLIPIDEMIA   . HYPERTENSION   . MACULAR DEGENERATION   . Myocardial infarction (HCC) 02/2010  . PAD (peripheral artery disease) (HCC)   . PEPTIC ULCER DISEASE, HX OF   . PVD (peripheral vascular disease) (HCC)   . RESTLESS LEGS SYNDROME   . SLEEP APNEA   . VERTIGO     Social History   Socioeconomic History  . Marital status: Married    Spouse name: Not on file  . Number of children: 2  . Years of education: Not on file  . Highest education level: Not on file  Occupational History  . Not on file  Social Needs  . Financial resource strain: Not on file  . Food insecurity:    Worry: Not on file    Inability: Not on file  . Transportation needs:    Medical: Not on file    Non-medical: Not on file  Tobacco Use  .  Smoking status: Former Smoker    Packs/day: 1.00    Years: 40.00    Pack years: 40.00    Types: Cigarettes    Last attempt to quit: 02/22/1987    Years since quitting: 30.3  . Smokeless tobacco: Never Used  Substance and Sexual Activity  . Alcohol use: No  . Drug use: No  . Sexual activity: Not on file  Lifestyle  . Physical activity:    Days per week: Not on file    Minutes per session: Not on file  . Stress: Not on file  Relationships  . Social connections:    Talks on phone: Not on file    Gets together: Not on file    Attends religious service: Not on file    Active member of club or organization: Not on file    Attends meetings of clubs or organizations: Not on file    Relationship  status: Not on file  . Intimate partner violence:    Fear of current or ex partner: Not on file    Emotionally abused: Not on file    Physically abused: Not on file    Forced sexual activity: Not on file  Other Topics Concern  . Not on file  Social History Narrative  . Not on file    Past Surgical History:  Procedure Laterality Date  . ABDOMINAL AORTIC ANEURYSM REPAIR     stenting   . CARDIAC CATHETERIZATION  02/2010   r/t NSTEMI; loss of SVG to RCA  . COLONOSCOPY WITH PROPOFOL N/A 02/04/2015   Procedure: COLONOSCOPY WITH PROPOFOL;  Surgeon: Charlott Rakes, MD;  Location[facility] ENDOSCOPY;  Service: Endoscopy;  Laterality: N/A;  . CORONARY ARTERY BYPASS GRAFT  12/1995   x5; LIMA to ALD, vein graft to RCA (now occluded),SVG to OM, SVG to diagonal  . ESOPHAGOGASTRODUODENOSCOPY N/A 01/28/2015   Procedure: ESOPHAGOGASTRODUODENOSCOPY (EGD);  Surgeon: Vida Rigger, MD;  Location: [facility] ENDOSCOPY;  Service: Endoscopy;  Laterality: N/A;  . INGUINAL HERNIA REPAIR     Left inguinal  . TRANSTHORACIC ECHOCARDIOGRAM  04/2011   EF 55-60%; mild LVH; grade 1 diastolic dysfunction    Family History  Problem Relation Age of Onset  . Heart disease Mother   . Heart disease Father   . Stroke Father   . Heart disease Sister   . Macular degeneration Sister   . Heart disease Sister   . Stroke Brother   . Hypertension Sister   . Heart Problems Sister        x2    Allergies  Allergen Reactions  . Cilostazol Other (See Comments)    Adverse reaction per patient - leg cramps  . Doxazosin Other (See Comments)    Leg cramps    Current Outpatient Medications on File Prior to Visit  Medication Sig Dispense Refill  . acetaminophen (TYLENOL) 325 MG tablet Take 650 mg by mouth every 6 (six) hours as needed for moderate pain.     Marland Kitchen atorvastatin (LIPITOR) 20 MG tablet TAKE 1 TABLET BY MOUTH ONCE DAILY 90 tablet 1  . docusate sodium (COLACE) 100 MG capsule Take 100 mg by mouth daily before supper.     Marland Kitchen  ELIQUIS 5 MG TABS tablet TAKE 1 TABLET BY MOUTH TWICE DAILY 180 tablet 0  . Ferrous Gluconate (IRON) 240 (27 Fe) MG TABS Take 1 tablet by mouth daily.    . furosemide (LASIX) 20 MG tablet TAKE 1 TABLET BY MOUTH ONCE DAILY 90 tablet 0  . metoprolol  tartrate (LOPRESSOR) 25 MG tablet TAKE ONE TABLET BY MOUTH TWICE DAILY 180 tablet 2  . Multiple Vitamin (MULTIVITAMIN WITH MINERALS) TABS tablet Take 1 tablet by mouth daily.    . nitroGLYCERIN (NITROSTAT) 0.4 MG SL tablet Place 1 tablet (0.4 mg total) under the tongue every 5 (five) minutes as needed. FOR CHEST PAIN (Patient taking differently: Place 0.4 mg under the tongue every 5 (five) minutes as needed for chest pain. ) 25 tablet 6  . pantoprazole (PROTONIX) 40 MG tablet TAKE 1 TABLET BY MOUTH ONCE DAILY 60 tablet 4  . PRESCRIPTION MEDICATION Inhale into the lungs at bedtime. CPAP - pressure is 10    . tamsulosin (FLOMAX) 0.4 MG CAPS capsule TAKE 1 CAPSULE BY MOUTH ONCE DAILY IN THE EVENING. 90 capsule 2  . [DISCONTINUED] carbidopa-levodopa (SINEMET CR) 25-100 MG per tablet Take by mouth. 1/2 tablet at bedtime      No current facility-administered medications on file prior to visit.     BP 110/62 (BP Location: Left Arm, Patient Position: Sitting, Cuff Size: Large)   Pulse 84   Temp 97.9 F (36.6 C) (Oral)   Wt 207 lb (93.9 kg)   SpO2 97%   BMI 28.87 kg/m       Objective:   Physical Exam  Constitutional: He is oriented to person, place, and time. He appears well-developed and well-nourished. No distress.  Cardiovascular: Normal rate, regular rhythm and intact distal pulses.  Murmur heard. Pulmonary/Chest: Effort normal and breath sounds normal. No stridor. No respiratory distress. He has no wheezes. He has no rales. He exhibits no tenderness.  Musculoskeletal: He exhibits edema (+ 2 pitting edema in lower extremities ( chronic)).  Neurological: He is alert and oriented to person, place, and time.  Skin: Skin is dry. Capillary refill  takes less than 2 seconds. No rash noted. He is not diaphoretic. No erythema. No pallor.  Psychiatric: He has a normal mood and affect. His behavior is normal. Judgment and thought content normal.  Nursing note and vitals reviewed.     Assessment & Plan:  1. Cognitive impairment - Possible UTI or age related cognitive impairment. Answered all questions appropriately today. Consider neuropsychiatry evaluation  - POC Urinalysis Dipstick - CBC with Differential/Platelet - Basic metabolic panel  Shirline Frees, NP

## 2017-06-26 ENCOUNTER — Telehealth: Payer: Self-pay | Admitting: Internal Medicine

## 2017-06-26 NOTE — Telephone Encounter (Signed)
Please call daughter asap, preferably 5.7.19 around 1:30-2:10; call amy when possible

## 2017-06-26 NOTE — Telephone Encounter (Signed)
Copied from CRM [id number]. Topic: Quick Communication - See Telephone Encounter >> Jun 26, 2017  2:53 PM Eston Mould B wrote: CRM for notification. See Telephone encounter for: 06/26/17.  PT's son called for lab results and asking we call her sister  with lab results   around 20    Amy contact number is  [phone]

## 2017-07-18 ENCOUNTER — Telehealth: Payer: Self-pay | Admitting: Family Medicine

## 2017-07-18 NOTE — Telephone Encounter (Signed)
Okay for podiatry referral

## 2017-07-18 NOTE — Telephone Encounter (Signed)
Do you want pt to try and come in this week or place the referral for podiatry.

## 2017-07-18 NOTE — Telephone Encounter (Signed)
Copied from CRM [id number]. Topic: Referral - Request >> Jul 18, 2017  8:38 AM Jolayne Haines L wrote: Reason for CRM: Patient's son ( Corey Mcdonald ) is wanting a referral to be placed for him because he has hard spots on both feet on the bottom. He said that one of them has gotten dark. He is really wanting him to be seen today at a podiatrist. He is requesting that a nurse call him back [phone]

## 2017-07-19 ENCOUNTER — Other Ambulatory Visit: Payer: Self-pay

## 2017-07-19 DIAGNOSIS — M7732 Calcaneal spur, left foot: Principal | ICD-10-CM

## 2017-07-19 DIAGNOSIS — M7731 Calcaneal spur, right foot: Secondary | ICD-10-CM

## 2017-07-19 NOTE — Telephone Encounter (Signed)
Left message with pt to have his son return call.

## 2017-07-19 NOTE — Telephone Encounter (Signed)
Referral placed! Pt son notified.

## 2017-07-20 ENCOUNTER — Encounter: Payer: Self-pay | Admitting: Podiatry

## 2017-07-20 ENCOUNTER — Ambulatory Visit: Payer: Medicare Other | Admitting: Podiatry

## 2017-07-20 VITALS — BP 138/71 | HR 65 | Resp 16

## 2017-07-20 DIAGNOSIS — Q828 Other specified congenital malformations of skin: Secondary | ICD-10-CM

## 2017-07-20 DIAGNOSIS — M79676 Pain in unspecified toe(s): Secondary | ICD-10-CM | POA: Diagnosis not present

## 2017-07-20 DIAGNOSIS — B351 Tinea unguium: Secondary | ICD-10-CM | POA: Diagnosis not present

## 2017-07-20 DIAGNOSIS — D689 Coagulation defect, unspecified: Secondary | ICD-10-CM | POA: Diagnosis not present

## 2017-07-20 NOTE — Patient Instructions (Signed)
Leave bandage on bottom of left foot in place until tomorrow 07/21/17. Then bandage can be removed. Follow up in approximately 3 months for routine foot care

## 2017-07-21 ENCOUNTER — Ambulatory Visit: Payer: Medicare Other | Admitting: Internal Medicine

## 2017-07-23 NOTE — Progress Notes (Signed)
Subjective:  Patient ID: Corey Mcdonald, male    DOB: 02-Aug-1923,  MRN: [id number] HPI Chief Complaint  Patient presents with  . Foot Pain    Plantar forefoot bilateral (L>R) - callused areas x several months, left sub 1st has darkened-family concerned, hurts when walking, requesting nail care, lives in assisted living and is very independent, daughter files calluses periodically, worse at night-swelling and pain  . New Patient (Initial Visit)    84 y.o. male presents with the above complaint.   ROS: Denies fever chills nausea vomiting muscle aches pains calf pain back pain chest pain shortness of breath.  Past Medical History:  Diagnosis Date  . AAA (abdominal aortic aneurysm) (HCC)   . ABDOMINAL AORTIC ANEURYSM   . Anemia January 2012   admitted with acute MI March 15, 2010 and has significant acute blood loss anemia requiring transfusions of two units of packed RBCs.  Status post upper endoscopy March 22, 2010 without High-Risk bleeding lesion.  History of peptic ulcer disease  . Atrial fibrillation (HCC)   . BENIGN PROSTATIC HYPERTROPHY, HX OF    . COLONIC POLYPS, HX OF   . CORONARY ARTERY DISEASE    non-ST segment MI, March 15, 2010  . History of Doppler ultrasound 05/2011   h/o claudication, stable ABIs  . History of nuclear stress test 2007   persantine; normal, low risk   . HYPERLIPIDEMIA   . HYPERTENSION   . MACULAR DEGENERATION   . Myocardial infarction (HCC) 02/2010  . PAD (peripheral artery disease) (HCC)   . PEPTIC ULCER DISEASE, HX OF   . PVD (peripheral vascular disease) (HCC)   . RESTLESS LEGS SYNDROME   . SLEEP APNEA   . VERTIGO    Past Surgical History:  Procedure Laterality Date  . ABDOMINAL AORTIC ANEURYSM REPAIR     stenting   . CARDIAC CATHETERIZATION  02/2010   r/t NSTEMI; loss of SVG to RCA  . COLONOSCOPY WITH PROPOFOL N/A 02/04/2015   Procedure: COLONOSCOPY WITH PROPOFOL;  Surgeon: Charlott RakesVincent Schooler, MD;  [facility];  Service:  Endoscopy;  Laterality: N/A;  . CORONARY ARTERY BYPASS GRAFT  12/1995   x5; LIMA to ALD, vein graft to RCA (now occluded),SVG to OM, SVG to diagonal  . ESOPHAGOGASTRODUODENOSCOPY N/A 01/28/2015   Procedure: ESOPHAGOGASTRODUODENOSCOPY (EGD);  Surgeon: Vida RiggerMarc Magod, MD;  [facility];  Service: Endoscopy;  Laterality: N/A;  . INGUINAL HERNIA REPAIR     Left inguinal  . TRANSTHORACIC ECHOCARDIOGRAM  04/2011   EF 55-60%; mild LVH; grade 1 diastolic dysfunction    Current Outpatient Medications:  .  acetaminophen (TYLENOL) 325 MG tablet, Take 650 mg by mouth every 6 (six) hours as needed for moderate pain. , Disp: , Rfl:  .  atorvastatin (LIPITOR) 20 MG tablet, TAKE 1 TABLET BY MOUTH ONCE DAILY, Disp: 90 tablet, Rfl: 1 .  docusate sodium (COLACE) 100 MG capsule, Take 100 mg by mouth daily before supper. , Disp: , Rfl:  .  ELIQUIS 5 MG TABS tablet, TAKE 1 TABLET BY MOUTH TWICE DAILY, Disp: 180 tablet, Rfl: 0 .  Ferrous Gluconate (IRON) 240 (27 Fe) MG TABS, Take 1 tablet by mouth daily., Disp: , Rfl:  .  furosemide (LASIX) 20 MG tablet, TAKE 1 TABLET BY MOUTH ONCE DAILY, Disp: 90 tablet, Rfl: 0 .  metoprolol tartrate (LOPRESSOR) 25 MG tablet, TAKE ONE TABLET BY MOUTH TWICE DAILY, Disp: 180 tablet, Rfl: 2 .  Multiple Vitamin (MULTIVITAMIN WITH MINERALS) TABS tablet, Take 1 tablet  by mouth daily., Disp: , Rfl:  .  nitroGLYCERIN (NITROSTAT) 0.4 MG SL tablet, Place 1 tablet (0.4 mg total) under the tongue every 5 (five) minutes as needed. FOR CHEST PAIN (Patient taking differently: Place 0.4 mg under the tongue every 5 (five) minutes as needed for chest pain. ), Disp: 25 tablet, Rfl: 6 .  pantoprazole (PROTONIX) 40 MG tablet, TAKE 1 TABLET BY MOUTH ONCE DAILY, Disp: 60 tablet, Rfl: 4 .  PRESCRIPTION MEDICATION, Inhale into the lungs at bedtime. CPAP - pressure is 10, Disp: , Rfl:  .  tamsulosin (FLOMAX) 0.4 MG CAPS capsule, TAKE 1 CAPSULE BY MOUTH ONCE DAILY IN THE EVENING., Disp: 90 capsule, Rfl:  2  Allergies  Allergen Reactions  . Cilostazol Other (See Comments)    Adverse reaction per patient - leg cramps  . Doxazosin Other (See Comments)    Leg cramps   Review of Systems Objective:   Vitals:   07/20/17 1608  BP: 138/71  Pulse: 65  Resp: 16    General: Well developed, nourished, in no acute distress, alert and oriented x3   Dermatological: Skin is warm, dry and supple bilateral. Nails x 10 are well maintained; remaining integument appears unremarkable at this time. There are no open sores, no preulcerative lesions, no rash or signs of infection present.  Reactive hyperkeratosis sub-first metatarsal phalangeal joint left greater than right was debrided demonstrates near ulcerative conditions.  No signs of infection at this point.  No erythema cellulitis drainage or odor he did have sub-lesional bleeding there was dried once the skin was debrided.  Toenails are long thick yellow dystrophic and clinically mycotic.  Vascular: Dorsalis Pedis artery and Posterior Tibial artery pedal pulses are 2/4 bilateral with immedate capillary fill time. Pedal hair growth present. No varicosities and no lower extremity edema present bilateral.   Neruologic: Grossly intact via light touch bilateral. Vibratory intact via tuning fork bilateral. Protective threshold with Semmes Wienstein monofilament intact to all pedal sites bilateral. Patellar and Achilles deep tendon reflexes 2+ bilateral. No Babinski or clonus noted bilateral.   Musculoskeletal: No gross boney pedal deformities bilateral. No pain, crepitus, or limitation noted with foot and ankle range of motion bilateral. Muscular strength 5/5 in all groups tested bilateral.  Gait: Unassisted, Nonantalgic.    Radiographs:  None taken  Assessment & Plan:   Assessment: Reactive hyperkeratosis plantar aspect forefoot bilateral.  Pain in limb secondary to onychomycosis.  Plan: Debridement of all reactive hyperkeratosis.  Debridement of  toenails 1 through 5 bilateral.     Rosaire Cueto T. [place], [place]

## 2017-07-28 ENCOUNTER — Ambulatory Visit (INDEPENDENT_AMBULATORY_CARE_PROVIDER_SITE_OTHER): Payer: Medicare Other | Admitting: Internal Medicine

## 2017-07-28 ENCOUNTER — Encounter: Payer: Self-pay | Admitting: Internal Medicine

## 2017-07-28 VITALS — BP 132/70 | HR 76 | Ht 71.0 in | Wt 203.8 lb

## 2017-07-28 DIAGNOSIS — I5030 Unspecified diastolic (congestive) heart failure: Secondary | ICD-10-CM

## 2017-07-28 DIAGNOSIS — I482 Chronic atrial fibrillation, unspecified: Secondary | ICD-10-CM

## 2017-07-28 DIAGNOSIS — Z79899 Other long term (current) drug therapy: Secondary | ICD-10-CM | POA: Diagnosis not present

## 2017-07-28 DIAGNOSIS — I350 Nonrheumatic aortic (valve) stenosis: Secondary | ICD-10-CM | POA: Diagnosis not present

## 2017-07-28 LAB — BASIC METABOLIC PANEL
BUN/Creatinine Ratio: 17 (ref 10–24)
BUN: 26 mg/dL (ref 10–36)
CALCIUM: 9.2 mg/dL (ref 8.6–10.2)
CO2: 23 mmol/L (ref 20–29)
CREATININE: 1.54 mg/dL — AB (ref 0.76–1.27)
Chloride: 99 mmol/L (ref 96–106)
GFR, EST AFRICAN AMERICAN: 45 mL/min/{1.73_m2} — AB (ref >59)
GFR, EST NON AFRICAN AMERICAN: 39 mL/min/{1.73_m2} — AB (ref >59)
Glucose: 89 mg/dL (ref 65–99)
Potassium: 4.3 mmol/L (ref 3.5–5.2)
Sodium: 138 mmol/L (ref 134–144)

## 2017-07-28 LAB — PRO B NATRIURETIC PEPTIDE: NT-Pro BNP: 1469 pg/mL — ABNORMAL HIGH (ref 0–486)

## 2017-07-28 NOTE — Patient Instructions (Signed)
Medication Instructions:   Continue current medications  Labwork:  BNP + BMET today  Testing/Procedures:  Your physician has requested that you have an echocardiogram. Echocardiography is a painless test that uses sound waves to create images of your heart. It provides your doctor with information about the size and shape of your heart and how well your heart's chambers and valves are working. This procedure takes approximately one hour. There are no restrictions for this procedure. -- done at [address] - [address] Floor  Follow-Up:  Your physician recommends that you schedule a follow-up appointment in ONE MONTH - OK to overbook   If you need a refill on your cardiac medications before your next appointment, please call your pharmacy.  Any Other Special Instructions Will Be Listed Below (If Applicable).
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
## 2017-07-31 ENCOUNTER — Ambulatory Visit (HOSPITAL_COMMUNITY): Payer: Medicare Other | Attending: Cardiology

## 2017-07-31 ENCOUNTER — Other Ambulatory Visit: Payer: Self-pay | Admitting: Internal Medicine

## 2017-07-31 ENCOUNTER — Other Ambulatory Visit: Payer: Self-pay

## 2017-07-31 DIAGNOSIS — Z87891 Personal history of nicotine dependence: Secondary | ICD-10-CM | POA: Insufficient documentation

## 2017-07-31 DIAGNOSIS — I350 Nonrheumatic aortic (valve) stenosis: Secondary | ICD-10-CM | POA: Insufficient documentation

## 2017-07-31 DIAGNOSIS — E119 Type 2 diabetes mellitus without complications: Secondary | ICD-10-CM | POA: Diagnosis not present

## 2017-07-31 DIAGNOSIS — E785 Hyperlipidemia, unspecified: Secondary | ICD-10-CM | POA: Diagnosis not present

## 2017-07-31 DIAGNOSIS — I110 Hypertensive heart disease with heart failure: Secondary | ICD-10-CM | POA: Diagnosis not present

## 2017-07-31 DIAGNOSIS — I482 Chronic atrial fibrillation, unspecified: Secondary | ICD-10-CM

## 2017-07-31 DIAGNOSIS — I083 Combined rheumatic disorders of mitral, aortic and tricuspid valves: Secondary | ICD-10-CM | POA: Insufficient documentation

## 2017-07-31 DIAGNOSIS — I5030 Unspecified diastolic (congestive) heart failure: Secondary | ICD-10-CM | POA: Diagnosis present

## 2017-07-31 DIAGNOSIS — I252 Old myocardial infarction: Secondary | ICD-10-CM | POA: Insufficient documentation

## 2017-08-03 ENCOUNTER — Other Ambulatory Visit: Payer: Self-pay

## 2017-08-03 ENCOUNTER — Ambulatory Visit: Payer: Medicare Other | Admitting: Adult Health

## 2017-08-03 ENCOUNTER — Ambulatory Visit: Payer: Medicare Other | Admitting: Internal Medicine

## 2017-08-03 ENCOUNTER — Encounter: Payer: Self-pay | Admitting: Adult Health

## 2017-08-03 VITALS — BP 118/64 | Temp 97.7°F | Wt 207.0 lb

## 2017-08-03 DIAGNOSIS — Z9989 Dependence on other enabling machines and devices: Secondary | ICD-10-CM | POA: Diagnosis not present

## 2017-08-03 DIAGNOSIS — Z7689 Persons encountering health services in other specified circumstances: Secondary | ICD-10-CM

## 2017-08-03 DIAGNOSIS — I482 Chronic atrial fibrillation, unspecified: Secondary | ICD-10-CM

## 2017-08-03 DIAGNOSIS — I10 Essential (primary) hypertension: Secondary | ICD-10-CM | POA: Diagnosis not present

## 2017-08-03 DIAGNOSIS — G4733 Obstructive sleep apnea (adult) (pediatric): Secondary | ICD-10-CM | POA: Diagnosis not present

## 2017-08-03 DIAGNOSIS — E785 Hyperlipidemia, unspecified: Secondary | ICD-10-CM | POA: Diagnosis not present

## 2017-08-03 MED ORDER — FUROSEMIDE 20 MG PO TABS: 20.0000 mg | ORAL_TABLET | Freq: Two times a day (BID) | ORAL | 3 refills | Status: DC

## 2017-08-03 NOTE — Progress Notes (Signed)
Patient presents to clinic today to establish care. He is a pleasant 84 year old male who  has a past medical history of AAA (abdominal aortic aneurysm) (HCC), ABDOMINAL AORTIC ANEURYSM, Anemia (January 2012), Atrial fibrillation (HCC), BENIGN PROSTATIC HYPERTROPHY, HX OF ( ), COLONIC POLYPS, HX OF, CORONARY ARTERY DISEASE, History of Doppler ultrasound (05/2011), History of nuclear stress test (2007), HYPERLIPIDEMIA, HYPERTENSION, MACULAR DEGENERATION, Myocardial infarction (HCC) (02/2010), PAD (peripheral artery disease) (HCC), PEPTIC ULCER DISEASE, HX OF, PVD (peripheral vascular disease) (HCC), RESTLESS LEGS SYNDROME, SLEEP APNEA, and VERTIGO.  He is a former patient of Dr. Kirtland Bouchard who is transferring care to me. He presents to the office today with his two children.    Acute Concerns: Establish Care   Chronic Issues: Hypertension - Well controlled with Metoprolol  BP Readings from Last 3 Encounters:  08/03/17 118/64  07/28/17 132/70  07/20/17 138/71   Hyperlipidemia - controlled with Lipitor 20 mg  Lab Results  Component Value Date   CHOL 156 10/14/2016   HDL 37.40 (L) 10/14/2016   LDLCALC 92 10/14/2016   TRIG 131.0 10/14/2016   CHOLHDL 4 10/14/2016   Atrial Fibrillation - on Eliquis - Is followed by Cardiology   OSA - Uses CPAP, Is ollowed by Pulmonary. Uses religiously   Diastolic Heart Failure - is seen by cardiology. Currently prescribed Lasix 20 mg BID.   History of GI bleed - currently prescribed Protonix. No recent GI bleeding noted by family.   AAA with stent - followed by Dr. Arbie Cookey.   BPH - controlled with Flomax   Cognitive Impairment - Family feels as though he is no longer impaired    Health Maintenance: Dental -- Routine Care Vision -- Routine Care due to Macular Degeneration  Immunizations -- Needs tdap.  Colonoscopy -- No longer indicated  Treatment Team  1. Cardiology - Dr. Rennis Golden  2. Pulmonary - Dr. Vassie Loll 3. Vascular - Dr. Arbie Cookey.  4. Eye Doctor -  [facility] 5. Dermatologist - Dr. Emily Filbert  6. Podiatry - Dr. Al Corpus   Past Medical History:  Diagnosis Date  . AAA (abdominal aortic aneurysm) (HCC)   . ABDOMINAL AORTIC ANEURYSM   . Anemia January 2012   admitted with acute MI March 15, 2010 and has significant acute blood loss anemia requiring transfusions of two units of packed RBCs.  Status post upper endoscopy March 22, 2010 without High-Risk bleeding lesion.  History of peptic ulcer disease  . Atrial fibrillation (HCC)   . BENIGN PROSTATIC HYPERTROPHY, HX OF    . COLONIC POLYPS, HX OF   . CORONARY ARTERY DISEASE    non-ST segment MI, March 15, 2010  . History of Doppler ultrasound 05/2011   h/o claudication, stable ABIs  . History of nuclear stress test 2007   persantine; normal, low risk   . HYPERLIPIDEMIA   . HYPERTENSION   . MACULAR DEGENERATION   . Myocardial infarction (HCC) 02/2010  . PAD (peripheral artery disease) (HCC)   . PEPTIC ULCER DISEASE, HX OF   . PVD (peripheral vascular disease) (HCC)   . RESTLESS LEGS SYNDROME   . SLEEP APNEA   . VERTIGO     Past Surgical History:  Procedure Laterality Date  . ABDOMINAL AORTIC ANEURYSM REPAIR     stenting   . CARDIAC CATHETERIZATION  02/2010   r/t NSTEMI; loss of SVG to RCA  . CATARACT EXTRACTION    . COLONOSCOPY WITH PROPOFOL N/A 02/04/2015   Procedure: COLONOSCOPY WITH PROPOFOL;  Surgeon:  Charlott Rakes, MD;  Location: [facility] ENDOSCOPY;  Service: Endoscopy;  Laterality: N/A;  . CORONARY ARTERY BYPASS GRAFT  12/1995   x5; LIMA to ALD, vein graft to RCA (now occluded),SVG to OM, SVG to diagonal  . ESOPHAGOGASTRODUODENOSCOPY N/A 01/28/2015   Procedure: ESOPHAGOGASTRODUODENOSCOPY (EGD);  Surgeon: Vida Rigger, MD;  Location: [facility] ENDOSCOPY;  Service: Endoscopy;  Laterality: N/A;  . INGUINAL HERNIA REPAIR     Left inguinal  . TRANSTHORACIC ECHOCARDIOGRAM  04/2011   EF 55-60%; mild LVH; grade 1 diastolic dysfunction    Current Outpatient Medications on File Prior to  Visit  Medication Sig Dispense Refill  . acetaminophen (TYLENOL) 325 MG tablet Take 650 mg by mouth every 6 (six) hours as needed for moderate pain.     Marland Kitchen atorvastatin (LIPITOR) 20 MG tablet TAKE 1 TABLET BY MOUTH ONCE DAILY 90 tablet 1  . docusate sodium (COLACE) 100 MG capsule Take 100 mg by mouth daily before supper.     Marland Kitchen ELIQUIS 5 MG TABS tablet TAKE 1 TABLET BY MOUTH TWICE DAILY 180 tablet 3  . Ferrous Gluconate (IRON) 240 (27 Fe) MG TABS Take 1 tablet by mouth daily.    . furosemide (LASIX) 20 MG tablet TAKE 1 TABLET BY MOUTH ONCE DAILY 90 tablet 3  . metoprolol tartrate (LOPRESSOR) 25 MG tablet TAKE ONE TABLET BY MOUTH TWICE DAILY 180 tablet 2  . Multiple Vitamin (MULTIVITAMIN WITH MINERALS) TABS tablet Take 1 tablet by mouth daily.    . nitroGLYCERIN (NITROSTAT) 0.4 MG SL tablet Place 1 tablet (0.4 mg total) under the tongue every 5 (five) minutes as needed. FOR CHEST PAIN (Patient taking differently: Place 0.4 mg under the tongue every 5 (five) minutes as needed for chest pain. ) 25 tablet 6  . pantoprazole (PROTONIX) 40 MG tablet TAKE 1 TABLET BY MOUTH ONCE DAILY 60 tablet 4  . PRESCRIPTION MEDICATION Inhale into the lungs at bedtime. CPAP - pressure is 10    . tamsulosin (FLOMAX) 0.4 MG CAPS capsule TAKE 1 CAPSULE BY MOUTH ONCE DAILY IN THE EVENING. 90 capsule 2  . [DISCONTINUED] carbidopa-levodopa (SINEMET CR) 25-100 MG per tablet Take by mouth. 1/2 tablet at bedtime      No current facility-administered medications on file prior to visit.     Allergies  Allergen Reactions  . Cilostazol Other (See Comments)    Adverse reaction per patient - leg cramps  . Doxazosin Other (See Comments)    Leg cramps    Family History  Problem Relation Age of Onset  . Heart disease Mother   . Heart disease Father   . Stroke Father   . Heart disease Sister   . Macular degeneration Sister   . Heart disease Sister   . Stroke Brother   . Hypertension Sister   . Heart Problems Sister         x2    Social History   Socioeconomic History  . Marital status: Married    Spouse name: Not on file  . Number of children: 2  . Years of education: Not on file  . Highest education level: Not on file  Occupational History  . Not on file  Social Needs  . Financial resource strain: Not on file  . Food insecurity:    Worry: Not on file    Inability: Not on file  . Transportation needs:    Medical: Not on file    Non-medical: Not on file  Tobacco Use  . Smoking status: Former  Smoker    Packs/day: 1.00    Years: 40.00    Pack years: 40.00    Types: Cigarettes    Last attempt to quit: 02/22/1987    Years since quitting: 30.4  . Smokeless tobacco: Never Used  Substance and Sexual Activity  . Alcohol use: No  . Drug use: No  . Sexual activity: Not on file  Lifestyle  . Physical activity:    Days per week: Not on file    Minutes per session: Not on file  . Stress: Not on file  Relationships  . Social connections:    Talks on phone: Not on file    Gets together: Not on file    Attends religious service: Not on file    Active member of club or organization: Not on file    Attends meetings of clubs or organizations: Not on file    Relationship status: Not on file  . Intimate partner violence:    Fear of current or ex partner: Not on file    Emotionally abused: Not on file    Physically abused: Not on file    Forced sexual activity: Not on file  Other Topics Concern  . Not on file  Social History Narrative  . Not on file    Review of Systems  Constitutional: Positive for malaise/fatigue.  HENT: Negative.   Eyes: Negative.   Respiratory: Negative.   Cardiovascular: Positive for leg swelling.  Gastrointestinal: Negative.   Genitourinary: Negative.   Musculoskeletal: Positive for falls (history of ).  Skin: Negative.   Endo/Heme/Allergies: Bruises/bleeds easily.  Psychiatric/Behavioral: Negative.     BP 118/64   Temp 97.7 F (36.5 C) (Oral)   Wt 207 lb (93.9  kg)   BMI 28.87 kg/m   Physical Exam  Constitutional: He is oriented to person, place, and time. He appears well-developed and well-nourished. He appears lethargic. No distress.  Eyes: Pupils are equal, round, and reactive to light. Conjunctivae and EOM are normal. Right eye exhibits no discharge. Left eye exhibits no discharge. No scleral icterus.  Neck: Neck supple.  Cardiovascular: Intact distal pulses. An irregularly irregular rhythm present.  Murmur heard.  Systolic murmur is present with a grade of 3/6. Pulmonary/Chest: Effort normal and breath sounds normal. No stridor. No respiratory distress. He has no wheezes. He has no rales. He exhibits no tenderness.  Abdominal: Soft. Bowel sounds are normal.  Musculoskeletal: He exhibits edema. He exhibits no tenderness or deformity.  +2 pitting edema in bilateral lower extremities   Neurological: He is oriented to person, place, and time. He appears lethargic.  Skin: Skin is warm and dry. Capillary refill takes less than 2 seconds. He is not diaphoretic.  Psychiatric: He has a normal mood and affect. His behavior is normal. Thought content normal. His speech is delayed.  Nursing note and vitals reviewed.      Recent Results (from the past 2160 hour(s))  CBC with Differential/Platelet     Status: Abnormal   Collection Time: 06/23/17 10:00 AM  Result Value Ref Range   WBC 7.9 4.0 - 10.5 K/uL   RBC 3.91 (L) 4.22 - 5.81 Mil/uL   Hemoglobin 12.0 (L) 13.0 - 17.0 g/dL   HCT 16.1 (L) 09.6 - 04.5 %   MCV 93.0 78.0 - 100.0 fl   MCHC 33.0 30.0 - 36.0 g/dL   RDW 40.9 81.1 - 91.4 %   Platelets 243.0 150.0 - 400.0 K/uL   Neutrophils Relative % 77.3 (H) 43.0 - 77.0 %  Lymphocytes Relative 8.9 (L) 12.0 - 46.0 %   Monocytes Relative 11.3 3.0 - 12.0 %   Eosinophils Relative 1.7 0.0 - 5.0 %   Basophils Relative 0.8 0.0 - 3.0 %   Neutro Abs 6.2 1.4 - 7.7 K/uL   Lymphs Abs 0.7 0.7 - 4.0 K/uL   Monocytes Absolute 0.9 0.1 - 1.0 K/uL   Eosinophils  Absolute 0.1 0.0 - 0.7 K/uL   Basophils Absolute 0.1 0.0 - 0.1 K/uL  Basic metabolic panel     Status: Abnormal   Collection Time: 06/23/17 10:00 AM  Result Value Ref Range   Sodium 138 135 - 145 mEq/L   Potassium 4.2 3.5 - 5.1 mEq/L   Chloride 99 96 - 112 mEq/L   CO2 32 19 - 32 mEq/L   Glucose, Bld 87 70 - 99 mg/dL   BUN 29 (H) 6 - 23 mg/dL   Creatinine, Ser 4.091.85 (H) 0.40 - 1.50 mg/dL   Calcium 9.6 8.4 - 81.110.5 mg/dL   GFR 91.4736.56 (L) >82.95>60.00 mL/min  POC Urinalysis Dipstick     Status: None   Collection Time: 06/23/17 12:33 PM  Result Value Ref Range   Color, UA yellow    Clarity, UA clear    Glucose, UA N    Bilirubin, UA N    Ketones, UA N    Spec Grav, UA 1.015 1.010 - 1.025   Blood, UA N    pH, UA 7.0 5.0 - 8.0   Protein, UA N    Urobilinogen, UA 0.2 0.2 or 1.0 E.U./dL   Nitrite, UA N    Leukocytes, UA Negative Negative   Appearance     Odor    Basic metabolic panel     Status: Abnormal   Collection Time: 07/28/17  9:40 AM  Result Value Ref Range   Glucose 89 65 - 99 mg/dL   BUN 26 10 - 36 mg/dL   Creatinine, Ser 6.211.54 (H) 0.76 - 1.27 mg/dL   GFR calc non Af Amer 39 (L) >59 mL/min/1.73   GFR calc Af Amer 45 (L) >59 mL/min/1.73   BUN/Creatinine Ratio 17 10 - 24   Sodium 138 134 - 144 mmol/L   Potassium 4.3 3.5 - 5.2 mmol/L   Chloride 99 96 - 106 mmol/L   CO2 23 20 - 29 mmol/L   Calcium 9.2 8.6 - 10.2 mg/dL  Pro b natriuretic peptide (BNP)     Status: Abnormal   Collection Time: 07/28/17  9:40 AM  Result Value Ref Range   NT-Pro BNP 1,469 (H) 0 - 486 pg/mL    Comment: The following cut-points have been suggested for the use of proBNP for the diagnostic evaluation of heart failure (HF) in patients with acute dyspnea: Modality                     Age           Optimal Cut                            (years)            Point ------------------------------------------------------ Diagnosis (rule in HF)        <50            450 pg/mL                           50 - 75  900 pg/mL                               >75           1800 pg/mL Exclusion (rule out HF)  Age independent     300 pg/mL     Assessment/Plan: He is due for yearly visit in August, we will see him back then or sooner if needed.   Advised to continue with plan of care set forth by his specialists.No medication changes today    Advised family to let me know if anything comes up or if they need anything for this patient.   Shirline Frees, NP

## 2017-09-04 ENCOUNTER — Other Ambulatory Visit: Payer: Self-pay | Admitting: Internal Medicine

## 2017-09-05 NOTE — Telephone Encounter (Signed)
[name] TP

## 2017-09-06 NOTE — Telephone Encounter (Signed)
[name], pt has recently established with you.  Scheduled for cpx on 10/27/17.    Please advise.

## 2017-09-06 NOTE — Telephone Encounter (Signed)
Ok to refill 90 +1  

## 2017-09-06 NOTE — Telephone Encounter (Signed)
Sent to the pharmacy by e-scribe. 

## 2017-09-11 ENCOUNTER — Encounter: Payer: Self-pay | Admitting: Internal Medicine

## 2017-09-11 ENCOUNTER — Ambulatory Visit: Payer: Medicare Other | Admitting: Internal Medicine

## 2017-09-11 VITALS — BP 111/61 | HR 92 | Ht 63.0 in | Wt 198.0 lb

## 2017-09-11 DIAGNOSIS — I482 Chronic atrial fibrillation, unspecified: Secondary | ICD-10-CM

## 2017-09-11 DIAGNOSIS — I5030 Unspecified diastolic (congestive) heart failure: Secondary | ICD-10-CM

## 2017-09-11 DIAGNOSIS — Z951 Presence of aortocoronary bypass graft: Secondary | ICD-10-CM | POA: Diagnosis not present
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
## 2017-09-11 NOTE — Patient Instructions (Signed)
Your physician recommends that you schedule a follow-up appointment in THREE MONTHS with Dr. Hilty.  

## 2017-09-22 ENCOUNTER — Other Ambulatory Visit: Payer: Self-pay | Admitting: Adult Health

## 2017-09-22 ENCOUNTER — Ambulatory Visit: Payer: Self-pay

## 2017-09-22 MED ORDER — CIPROFLOXACIN HCL 500 MG PO TABS: 500.0000 mg | ORAL_TABLET | Freq: Two times a day (BID) | ORAL | 0 refills | Status: DC

## 2017-09-22 NOTE — Telephone Encounter (Signed)
   Answer Assessment - Initial Assessment Questions 1. SYMPTOM: "What's the main symptom you're concerned about?" (e.g., frequency, incontinence)      unknown 2. ONSET: "When did the  Yesterday evening  start?"     *No Answer* 3. PAIN: "Is there any pain?" If so, ask: "How bad is it?" (Scale: 1-10; mild, moderate, severe)     no 4. CAUSE: "What do you think is causing the symptoms?"     utI? 5. OTHER SYMPTOMS: "Do you have any other symptoms?" (e.g., fever, flank pain, blood in urine, pain with urination)    No blood, no fever  6. PREGNANCY: "Is there any chance you are pregnant?" "When was your last menstrual period?"     na  Protocols used: URINARY [id number]

## 2017-09-22 NOTE — Telephone Encounter (Signed)
Spoke to Amy and informed her that antibiotic has been sent in this one time.  If pt is still symptomatic than he will need to be seen.  Notified her that in the future the pt will need to be seen in the office as it would be ideal to be able to get a culture as well.

## 2017-09-22 NOTE — Telephone Encounter (Signed)
Incoming call from Patients daughter; Velna Ochsmy Scott. Daughter states that She  Did a home test strip test for UTI on her Father.  Strip came out slightly pink .  She states that her Father says that he is "disoriented"  at times.  He says  " why am I so disoriented"?  He is 82 yo.  Lives in a independent living facility.  States symptoms started last yesterday.  Daughter thinks that he maybe getting a UTI.  Patient denies seeing blood in urine denies fever also.  Daughter would like something called into [facility] [address] . Please. Was told that I would route encounter.

## 2017-09-22 NOTE — Telephone Encounter (Signed)
Please call daughter and let her know that I have sent in Cipro for possible UTI. Going forward I do need an office visit for this so that we can get a urine culture and make sure we are treating appropriately

## 2017-10-06 ENCOUNTER — Ambulatory Visit: Payer: Medicare Other | Admitting: Family Medicine

## 2017-10-06 ENCOUNTER — Encounter: Payer: Self-pay | Admitting: Family Medicine

## 2017-10-06 VITALS — BP 102/64 | HR 98 | Temp 96.7°F | Wt 201.0 lb

## 2017-10-06 DIAGNOSIS — R410 Disorientation, unspecified: Secondary | ICD-10-CM | POA: Diagnosis not present

## 2017-10-06 DIAGNOSIS — R600 Localized edema: Secondary | ICD-10-CM | POA: Diagnosis not present

## 2017-10-06 DIAGNOSIS — I5032 Chronic diastolic (congestive) heart failure: Secondary | ICD-10-CM | POA: Diagnosis not present

## 2017-10-06 LAB — POC URINALSYSI DIPSTICK (AUTOMATED)
Bilirubin, UA: NEGATIVE
Blood, UA: NEGATIVE
Glucose, UA: NEGATIVE
Ketones, UA: NEGATIVE
LEUKOCYTES UA: NEGATIVE
NITRITE UA: NEGATIVE
PH UA: 7 (ref 5.0–8.0)
Protein, UA: NEGATIVE
Spec Grav, UA: 1.015 (ref 1.010–1.025)
Urobilinogen, UA: 0.2 E.U./dL

## 2017-10-06 LAB — BASIC METABOLIC PANEL
BUN: 21 mg/dL (ref 6–23)
CO2: 31 meq/L (ref 19–32)
CREATININE: 1.62 mg/dL — AB (ref 0.40–1.50)
Calcium: 9 mg/dL (ref 8.4–10.5)
Chloride: 104 mEq/L (ref 96–112)
GFR: 42.59 mL/min — ABNORMAL LOW (ref >60.00)
Glucose, Bld: 116 mg/dL — ABNORMAL HIGH (ref 70–99)
POTASSIUM: 4.5 meq/L (ref 3.5–5.1)
SODIUM: 141 meq/L (ref 135–145)

## 2017-10-06 LAB — BRAIN NATRIURETIC PEPTIDE: PRO B NATRI PEPTIDE: 211 pg/mL — AB (ref 0.0–100.0)

## 2017-10-06 LAB — CBC
HCT: 32.6 % — ABNORMAL LOW (ref 39.0–52.0)
Hemoglobin: 10.6 g/dL — ABNORMAL LOW (ref 13.0–17.0)
MCHC: 32.7 g/dL (ref 30.0–36.0)
MCV: 90.6 fl (ref 78.0–100.0)
Platelets: 255 10*3/uL (ref 150.0–400.0)
RBC: 3.59 Mil/uL — ABNORMAL LOW (ref 4.22–5.81)
RDW: 15.5 % (ref 11.5–15.5)
WBC: 7.2 10*3/uL (ref 4.0–10.5)

## 2017-10-06 NOTE — Patient Instructions (Addendum)
Living With Heart Failure  Heart failure is a long-term (chronic) condition in which the heart cannot pump enough blood through the body. When this happens, parts of the body do not get the blood and oxygen they need. There is no cure for heart failure at this time, so it is important for you to take good care of yourself and follow the treatment plan set by your health care provider. If you are living with heart failure, there are ways to help you manage the disease. Follow these instructions at home: Living with heart failure requires you to make changes in your life. Your health care team will teach you about the changes you need to make in order to relieve your symptoms and lower your risk of going to the hospital. Follow the treatment plan as set by your health care provider. Medicines Medicines are important in reducing your heart's workload, slowing the progression of heart failure, and improving your symptoms.  Take over-the-counter and prescription medicines only as told by your health care provider.  Do not stop taking your medicine unless your health care provider tells you to do that.  Do not skip any dose of your medicine.  Refill prescriptions before you run out of medicine. You need your medicines every day.  Eating and drinking  Eat heart-healthy foods. Talk with a dietitian to make an eating plan that is right for you. ? If directed by your health care provider: ? Limit salt (sodium). Lowering your sodium intake may reduce symptoms of heart failure. Ask a dietitian to recommend heart-healthy seasonings. ? Limit your fluid intake. Fluid restriction may reduce symptoms of heart failure. ? Use low-fat cooking methods instead of frying. Low-fat methods include roasting, grilling, broiling, baking, poaching, steaming, and stir-frying. ? Choose foods that contain no trans fat and are low in saturated fat and cholesterol. Healthy choices include fresh or frozen fruits and vegetables,  fish, lean meats, legumes, fat-free or low-fat dairy products, and whole-grain or high-fiber foods.  Limit alcohol intake to no more than 1 drink a day for nonpregnant women and 2 drinks a day for men. One drink equals 12 oz of beer, 5 oz of wine, or 1 oz of hard liquor. ? Drinking more than that is harmful to your heart. Tell your health care provider if you drink alcohol several times a week. ? Talk with your health care provider about whether any level of alcohol use is safe for you. Activity  Ask your health care provider about attending cardiac rehabilitation. These programs include aerobic physical activity, which provides many benefits for your heart.  If no cardiac rehabilitation program is available, ask your health care provider what aerobic exercises are safe for you to do. Lifestyle Make the lifestyle changes recommended by your health care provider. In general:  Lose weight if your health care provider tells you to do that. Weight loss may reduce symptoms of heart failure.  Do not use any products that contain nicotine or tobacco, such as cigarettes or e-cigarettes. If you need help quitting, ask your health care provider.  Do not use street (illegal) drugs.  Return to your normal activities as told by your health care provider. Ask your health care provider what activities are safe for you.  General instructions  Make sure you weigh yourself every day to track your weight. Rapid weight gain may indicate an increase in fluid in your body and may increase the workload of your heart. ? Weigh yourself every morning. Do   this after you urinate but before you eat breakfast. ? Wear the same type of clothing, without shoes, each time you weigh yourself. ? Weigh yourself on the same scale and in the same spot each time.  Living with chronic heart failure often leads to emotions such as fear, stress, anxiety, and depression. If you feel any of these emotions and need help coping,  contact your health care provider. Other ways to get help include: ? Talking to friends and family members about your condition. They can give you support and guidance. Explain your symptoms to them and, if comfortable, invite them to attend appointments or rehabilitation with you. ? Joining a support group for people with chronic heart failure. Talking with other people who have the same symptoms may give you new ways of coping with your disease and your emotions.  Stay up to date with your shots (vaccines). Staying current on pneumococcal and influenza vaccines is especially important in preventing germs from attacking your airways (respiratory infections).  Keep all follow-up visits as told by your health care provider. This is important. How to recognize changes in your condition You and your family members need to know what changes to watch for in your condition. Watch for the following changes and report them to your health care provider:  Sudden weight gain. Ask your health care provider what amount of weight gain to report.  Shortness of breath: ? Feeling short of breath while at rest, with no exercise or activity that required great effort. ? Feeling breathless with activity.  Swelling of your lower legs or ankles.  Difficulty sleeping: ? You wake up feeling short of breath. ? You have to use more pillows to raise your head in order to sleep.  Frequent, dry, hacking cough.  Loss of appetite.  Feeling more tired all the time.  Depression or feelings of sadness or hopelessness.  Bloating in the stomach.  Where to find more information  Local support groups. Ask your health care provider about groups near you.  The American Heart Association: www.heart.org Contact a health care provider if:  You have a rapid weight gain.  You have increasing shortness of breath that is unusual for you.  You are unable to participate in your usual physical activities.  You tire  easily.  You cough more than normal, especially with physical activity.  You have any swelling or more swelling in areas such as your hands, feet, ankles, or abdomen.  You feel like your heart is beating quickly (palpitations).  You become dizzy or light-headed when you stand up. Get help right away if:  You have difficulty breathing.  You notice or your family notices a change in your awareness, such as having trouble staying awake or having difficulty with concentration.  You have pain or discomfort in your chest.  You have an episode of fainting (syncope). Summary  There is no cure for heart failure, so it is important for you to take good care of yourself and follow the treatment plan set by your health care provider.  Medicines are important in reducing your heart's workload, slowing the progression of heart failure, and improving your symptoms.  Living with chronic heart failure often leads to emotions such as fear, stress, anxiety, and depression. If you are feeling any of these emotions and need help coping, contact your health care provider. This information is not intended to replace advice given to you by your health care provider. Make sure you discuss any questions you   have with your health care provider. Document Released: 06/22/2016 Document Revised: 06/22/2016 Document Reviewed: 06/22/2016 Elsevier Interactive Patient Education  2018 [employer]  

## 2017-10-06 NOTE — Progress Notes (Signed)
Subjective:    Patient ID: Corey Mcdonald, male    DOB: [birth year], 84 y.o.   MRN: [id number]  No chief complaint on file. hx obtained from pt's daughter.  Pt hard of hearing, blind,   HPI Patient was seen today for ongoing concern.  Pt's daughter endorses continued confusion and disorientation.  Pt's daughter did a home urine test on 8/2 which indicated infection.  Ciprofloxacin was called in by pcp as a one time favor.  Pt's daughter notes some improvement in symptoms, but wanted to make sure infection resolved.  Home test this am "light pink".  Pt will state he doesn't know how to do something, then do it.    Pt has a h/o CHF.  Baseline weight 197 lbs.  On lasix 20 mg BID.  Followed by Cardiology.  Denies SOB or increased LE edema.  Past Medical History:  Diagnosis Date  . AAA (abdominal aortic aneurysm) (HCC)   . ABDOMINAL AORTIC ANEURYSM   . Anemia January 2012   admitted with acute MI March 15, 2010 and has significant acute blood loss anemia requiring transfusions of two units of packed RBCs.  Status post upper endoscopy March 22, 2010 without High-Risk bleeding lesion.  History of peptic ulcer disease  . Atrial fibrillation (HCC)   . BENIGN PROSTATIC HYPERTROPHY, HX OF    . COLONIC POLYPS, HX OF   . CORONARY ARTERY DISEASE    non-ST segment MI, March 15, 2010  . History of Doppler ultrasound 05/2011   h/o claudication, stable ABIs  . History of nuclear stress test 2007   persantine; normal, low risk   . HYPERLIPIDEMIA   . HYPERTENSION   . MACULAR DEGENERATION   . Myocardial infarction (HCC) 02/2010  . PAD (peripheral artery disease) (HCC)   . PEPTIC ULCER DISEASE, HX OF   . PVD (peripheral vascular disease) (HCC)   . RESTLESS LEGS SYNDROME   . SLEEP APNEA   . VERTIGO     Allergies  Allergen Reactions  . Cilostazol Other (See Comments)    Adverse reaction per patient - leg cramps  . Doxazosin Other (See Comments)    Leg cramps    ROS General: Denies fever,  chills, night sweats, changes in weight, changes in appetite   +confusion, disorientation HEENT: Denies headaches, ear pain, changes in vision, rhinorrhea, sore throat CV: Denies CP, palpitations, SOB, orthopnea Pulm: Denies SOB, cough, wheezing  +LE edema GI: Denies abdominal pain, nausea, vomiting, diarrhea, constipation GU: Denies dysuria, hematuria, frequency, vaginal discharge Msk: Denies muscle cramps, joint pains Neuro: Denies weakness, numbness, tingling Skin: Denies rashes, bruising Psych: Denies depression, anxiety, hallucinations     Objective:    Blood pressure 102/64, pulse 98, temperature (!) 96.7 F (35.9 C), temperature source Oral, weight 201 lb (91.2 kg), SpO2 95 %.   Gen. Pleasant, well-nourished, in no distress, normal affect   HEENT: [place]/AT, face symmetric, no scleral icterus, PERRLA, nares patent without drainage Lungs: no accessory muscle use, CTAB, no wheezes or rales Cardiovascular: RRR, murmur best heard LLSB mid clavicular line, 2+ pitting  Edema in b/l LE below knee. Abd: BS present, soft, NT, ND Neuro:  A&Ox3, CN II-XII intact, normal gait   Wt Readings from Last 3 Encounters:  10/06/17 201 lb (91.2 kg)  09/11/17 198 lb (89.8 kg)  08/03/17 207 lb (93.9 kg)    Lab Results  Component Value Date   WBC 7.9 06/23/2017   HGB 12.0 (L) 06/23/2017   HCT 36.4 (L) 06/23/2017  PLT 243.0 06/23/2017   GLUCOSE 89 07/28/2017   CHOL 156 10/14/2016   TRIG 131.0 10/14/2016   HDL 37.40 (L) 10/14/2016   LDLCALC 92 10/14/2016   ALT 18 10/14/2016   AST 17 10/14/2016   NA 138 07/28/2017   K 4.3 07/28/2017   CL 99 07/28/2017   CREATININE 1.54 (H) 07/28/2017   BUN 26 07/28/2017   CO2 23 07/28/2017   TSH 3.52 10/14/2016   PSA 2.74 06/13/2006   INR 1.30 01/09/2016   HGBA1C 6.1 (H) 01/10/2016    Assessment/Plan:  Disorientation  -Per chart review ongoing, cognitive impairment noted on visit 06/23/2017 with PCP -UA negative. -We will obtain labs including  CBC and BMP. - Plan: POCT Urinalysis Dipstick (Automated)  Bilateral leg edema  -History of heart failure -2+ pitting edema appears to be near baseline per daughter and chart review. - Plan: CBC (no diff), Basic metabolic panel, Brain Natriuretic Peptide  Chronic diastolic heart failure (HCC) -Approximately 3 pounds over baseline weight -Continue Lasix twice daily -We will obtain labs including BMP, CBC, BMP -Given RTC or ED precautions for worsening symptoms.  Advised to follow-up with cardiology for weight gain greater than 5 pounds.  Follow-up next week with PCP.    Abbe AmsterdamShannon Banks, MD

## 2017-10-13 ENCOUNTER — Other Ambulatory Visit: Payer: Self-pay | Admitting: Cardiology

## 2017-10-20 ENCOUNTER — Encounter: Payer: Self-pay | Admitting: Podiatry

## 2017-10-20 ENCOUNTER — Ambulatory Visit (INDEPENDENT_AMBULATORY_CARE_PROVIDER_SITE_OTHER): Payer: Medicare Other | Admitting: Podiatry

## 2017-10-20 DIAGNOSIS — M79674 Pain in right toe(s): Secondary | ICD-10-CM | POA: Diagnosis not present

## 2017-10-20 DIAGNOSIS — M79675 Pain in left toe(s): Secondary | ICD-10-CM | POA: Diagnosis not present

## 2017-10-20 DIAGNOSIS — I70219 Atherosclerosis of native arteries of extremities with intermittent claudication, unspecified extremity: Secondary | ICD-10-CM | POA: Diagnosis not present

## 2017-10-20 DIAGNOSIS — B351 Tinea unguium: Secondary | ICD-10-CM

## 2017-10-20 DIAGNOSIS — L84 Corns and callosities: Secondary | ICD-10-CM | POA: Diagnosis not present

## 2017-10-23 ENCOUNTER — Encounter: Payer: Self-pay | Admitting: Podiatry

## 2017-10-23 NOTE — Progress Notes (Signed)
Subjective: Corey Mcdonald is a 84 y.o. WM  seen in clinic today for at-risk foot care. He has h/o claudication and is followed by Cardiology. He has PVD (claudication), calluses and also on aspirin therapy.  He has h/o b/l claudication with reduced ABIs and is followed by [facility].   Primary Care Physician: Gordy Savers, MD  Last ABIs were in April 2013.   Objective: General: Elderly male, well developed, nourished, in no acute distress. Alert, awake and oriented x3   Vascular:  Dorsalis Pedis artery and Posterior Tibial artery pedal pulses are 2/4 bilateral with immedate capillary fill time. Pedal hair growth present. No varicosities and no lower extremity edema present bilateral.   Dermatological: Skin is warm, dry and supple bilateral. remaining integument appears unremarkable at this time.  There are no open sores, no rash or signs of infection present.    Reactive hyperkeratosis sub-first metatarsal phalangeal joint bilaterally, left greater than right was debrided demonstrates near ulcerative conditions:  Left submet head 1 lesion is hard, dry and hyperkeratotic with visible subdermal hemorrhage. No signs of infection at this point. No flocculence, no erythema, no cellulitis, no drainage nor odor.   Right submet head 1 lesion is hyperkeratotic. No signs of infection at this point.  No flocculence, no erythema, no cellulitis, no drainage nor odor.    Toenails 1-5 b/l are long thick yellow dystrophic and clinically mycotic.  Neurologic:  Grossly intact via light touch bilateral. Vibratory intact via tuning fork bilateral. Protective threshold with Semmes Wienstein monofilament intact to all pedal sites bilateral. Patellar and Achilles deep tendon reflexes 2+ bilateral. No Babinski or clonus noted bilateral.   Musculoskeletal:  No gross boney pedal deformities bilateral. No pain, crepitus, or limitation noted with foot and ankle range of motion bilateral.  Muscular strength 5/5 in all groups tested bilateral.  Gait: Unassisted, Nonantalgic.   Last ABI's, April 2013: Right: 0.92 Left: 0.77  Radiographs:  None taken  Assessment & Plan:   Assessment:  1. Pain in limb secondary to onychomycosis. 2.Calluses  submet head 1 b/l aspect forefoot bilateral.   3. Claudication b/l LE (followed by Cardiology)  Plan:  1. Debridement of toenails 1 through 5 bilateral. 2. Debridement of calluses x 2   3. Continue soft, supportive shoe gear daily.  4. Report any pedal injuries to healthcare professional immediately 5. Avoid self trimming due to PAD and use of blood thinners. 6.  Follow up 3 months. 7. Corey Mcdonald POA to call should there be a question/concern in the interim.

## 2017-10-27 ENCOUNTER — Encounter: Payer: Self-pay | Admitting: Adult Health

## 2017-10-27 ENCOUNTER — Ambulatory Visit (INDEPENDENT_AMBULATORY_CARE_PROVIDER_SITE_OTHER): Payer: Medicare Other | Admitting: Adult Health

## 2017-10-27 VITALS — BP 124/60 | Temp 97.5°F | Wt 201.0 lb

## 2017-10-27 DIAGNOSIS — Z23 Encounter for immunization: Secondary | ICD-10-CM

## 2017-10-27 DIAGNOSIS — Z9989 Dependence on other enabling machines and devices: Secondary | ICD-10-CM

## 2017-10-27 DIAGNOSIS — G4733 Obstructive sleep apnea (adult) (pediatric): Secondary | ICD-10-CM

## 2017-10-27 DIAGNOSIS — Z0000 Encounter for general adult medical examination without abnormal findings: Secondary | ICD-10-CM

## 2017-10-27 DIAGNOSIS — E785 Hyperlipidemia, unspecified: Secondary | ICD-10-CM | POA: Diagnosis not present

## 2017-10-27 DIAGNOSIS — I480 Paroxysmal atrial fibrillation: Secondary | ICD-10-CM | POA: Diagnosis not present

## 2017-10-27 DIAGNOSIS — I10 Essential (primary) hypertension: Secondary | ICD-10-CM

## 2017-10-27 LAB — POCT URINALYSIS DIPSTICK
Bilirubin, UA: NEGATIVE
Blood, UA: NEGATIVE
Glucose, UA: NEGATIVE
Ketones, UA: NEGATIVE
LEUKOCYTES UA: NEGATIVE
Nitrite, UA: NEGATIVE
ODOR: NEGATIVE
PH UA: 7 (ref 5.0–8.0)
Protein, UA: NEGATIVE
SPEC GRAV UA: 1.015 (ref 1.010–1.025)
UROBILINOGEN UA: 0.2 U/dL

## 2017-10-27 LAB — LIPID PANEL
CHOLESTEROL: 138 mg/dL (ref 0–200)
HDL: 50.2 mg/dL (ref >39.00)
LDL Cholesterol: 70 mg/dL (ref 0–99)
NonHDL: 88.07
Total CHOL/HDL Ratio: 3
Triglycerides: 88 mg/dL (ref 0.0–149.0)
VLDL: 17.6 mg/dL (ref 0.0–40.0)

## 2017-10-27 LAB — CBC WITH DIFFERENTIAL/PLATELET
Basophils Absolute: 0.1 10*3/uL (ref 0.0–0.1)
Basophils Relative: 0.8 % (ref 0.0–3.0)
EOS ABS: 0.1 10*3/uL (ref 0.0–0.7)
Eosinophils Relative: 1.5 % (ref 0.0–5.0)
HEMATOCRIT: 32.9 % — AB (ref 39.0–52.0)
Hemoglobin: 11 g/dL — ABNORMAL LOW (ref 13.0–17.0)
Lymphocytes Relative: 12 % (ref 12.0–46.0)
Lymphs Abs: 1.1 10*3/uL (ref 0.7–4.0)
MCHC: 33.3 g/dL (ref 30.0–36.0)
MCV: 88.9 fl (ref 78.0–100.0)
MONO ABS: 0.9 10*3/uL (ref 0.1–1.0)
Monocytes Relative: 10 % (ref 3.0–12.0)
NEUTROS ABS: 6.7 10*3/uL (ref 1.4–7.7)
Neutrophils Relative %: 75.7 % (ref 43.0–77.0)
PLATELETS: 316 10*3/uL (ref 150.0–400.0)
RBC: 3.7 Mil/uL — ABNORMAL LOW (ref 4.22–5.81)
RDW: 15.3 % (ref 11.5–15.5)
WBC: 8.8 10*3/uL (ref 4.0–10.5)

## 2017-10-27 LAB — HEPATIC FUNCTION PANEL
ALBUMIN: 4 g/dL (ref 3.5–5.2)
ALT: 24 U/L (ref 0–53)
AST: 23 U/L (ref 0–37)
Alkaline Phosphatase: 65 U/L (ref 39–117)
Bilirubin, Direct: 0.1 mg/dL (ref 0.0–0.3)
TOTAL PROTEIN: 7.2 g/dL (ref 6.0–8.3)
Total Bilirubin: 0.5 mg/dL (ref 0.2–1.2)

## 2017-10-27 LAB — BASIC METABOLIC PANEL
BUN: 34 mg/dL — AB (ref 6–23)
CALCIUM: 9.6 mg/dL (ref 8.4–10.5)
CO2: 32 meq/L (ref 19–32)
CREATININE: 1.77 mg/dL — AB (ref 0.40–1.50)
Chloride: 99 mEq/L (ref 96–112)
GFR: 38.44 mL/min — ABNORMAL LOW (ref >60.00)
Glucose, Bld: 95 mg/dL (ref 70–99)
Potassium: 4.7 mEq/L (ref 3.5–5.1)
SODIUM: 139 meq/L (ref 135–145)

## 2017-10-27 LAB — TSH: TSH: 3.93 u[IU]/mL (ref 0.35–4.50)

## 2017-10-27 MED ORDER — ATORVASTATIN CALCIUM 20 MG PO TABS: 20.0000 mg | ORAL_TABLET | Freq: Every day | ORAL | 3 refills | Status: DC

## 2017-10-27 NOTE — Progress Notes (Signed)
Subjective:    Patient ID: Corey Mcdonald, male    DOB: 1923/10/21, 84 y.o.   MRN: [id number]  HPI   Patient presents for yearly preventative medicine examination. He is a pleasant 84 year old male who  has a past medical history of AAA (abdominal aortic aneurysm) (HCC), ABDOMINAL AORTIC ANEURYSM, Anemia (January 2012), Atrial fibrillation (HCC), BENIGN PROSTATIC HYPERTROPHY, HX OF ( ), COLONIC POLYPS, HX OF, CORONARY ARTERY DISEASE, History of Doppler ultrasound (05/2011), History of nuclear stress test (2007), HYPERLIPIDEMIA, HYPERTENSION, MACULAR DEGENERATION, Myocardial infarction (HCC) (02/2010), PAD (peripheral artery disease) (HCC), PEPTIC ULCER DISEASE, HX OF, PVD (peripheral vascular disease) (HCC), RESTLESS LEGS SYNDROME, SLEEP APNEA, and VERTIGO. His daughter is presents today. She reports that Corey Mcdonald is at his usual state of health  Hypertension - Takes Metoprolol - well controlled.  BP Readings from Last 3 Encounters:  10/27/17 124/60  10/06/17 102/64  09/11/17 111/61    Hyperlipidemia - Lipitor 20 mg - well controlled.  Lab Results  Component Value Date   CHOL 156 10/14/2016   HDL 37.40 (L) 10/14/2016   LDLCALC 92 10/14/2016   TRIG 131.0 10/14/2016   CHOLHDL 4 10/14/2016    Atrial Fibrillation - on Eliquis and metoprolol for rate control - Is followed by Cardiology   OSA - Uses CPAP, Is ollowed by Pulmonary. Uses religiously   Diastolic Heart Failure - is seen by cardiology. Currently prescribed Lasix 20 mg BID. last echo in June 2019 showed LEVF of 55% with mild aortic stenosis .   AAA with stent - followed by Dr. Arbie Cookey.   BPH - controlled with Flomax   All immunizations and health maintenance protocols were reviewed with the patient and needed orders were placed. He is due for seasonal flu vaccination   Appropriate screening laboratory values were ordered for the patient including screening of hyperlipidemia, renal function and hepatic function.  Medication  reconciliation,  past medical history, social history, problem list and allergies were reviewed in detail with the patient  Goals were established with regard to weight loss, exercise, and  diet in compliance with medications Wt Readings from Last 3 Encounters:  10/27/17 201 lb (91.2 kg)  10/06/17 201 lb (91.2 kg)  09/11/17 198 lb (89.8 kg)   End of life planning was discussed.  Review of Systems  Constitutional: Positive for fatigue.  HENT: Negative.   Eyes: Negative.   Respiratory: Negative.   Cardiovascular: Positive for leg swelling.  Gastrointestinal: Negative.   Endocrine: Negative.   Genitourinary: Negative.   Musculoskeletal: Positive for arthralgias.  Skin: Negative.   Allergic/Immunologic: Negative.   Neurological: Negative.   Hematological: Negative.   Psychiatric/Behavioral: Negative.   All other systems reviewed and are negative.  Past Medical History:  Diagnosis Date  . AAA (abdominal aortic aneurysm) (HCC)   . ABDOMINAL AORTIC ANEURYSM   . Anemia January 2012   admitted with acute MI March 15, 2010 and has significant acute blood loss anemia requiring transfusions of two units of packed RBCs.  Status post upper endoscopy March 22, 2010 without High-Risk bleeding lesion.  History of peptic ulcer disease  . Atrial fibrillation (HCC)   . BENIGN PROSTATIC HYPERTROPHY, HX OF    . COLONIC POLYPS, HX OF   . CORONARY ARTERY DISEASE    non-ST segment MI, March 15, 2010  . History of Doppler ultrasound 05/2011   h/o claudication, stable ABIs  . History of nuclear stress test 2007   persantine; normal, low risk   . HYPERLIPIDEMIA   .  HYPERTENSION   . MACULAR DEGENERATION   . Myocardial infarction (HCC) 02/2010  . PAD (peripheral artery disease) (HCC)   . PEPTIC ULCER DISEASE, HX OF   . PVD (peripheral vascular disease) (HCC)   . RESTLESS LEGS SYNDROME   . SLEEP APNEA   . VERTIGO     Social History   Socioeconomic History  . Marital status: Married     Spouse name: Not on file  . Number of children: 2  . Years of education: Not on file  . Highest education level: Not on file  Occupational History  . Not on file  Social Needs  . Financial resource strain: Not on file  . Food insecurity:    Worry: Not on file    Inability: Not on file  . Transportation needs:    Medical: Not on file    Non-medical: Not on file  Tobacco Use  . Smoking status: Former Smoker    Packs/day: 1.00    Years: 40.00    Pack years: 40.00    Types: Cigarettes    Last attempt to quit: 02/22/1987    Years since quitting: 30.6  . Smokeless tobacco: Never Used  Substance and Sexual Activity  . Alcohol use: No  . Drug use: No  . Sexual activity: Not on file  Lifestyle  . Physical activity:    Days per week: Not on file    Minutes per session: Not on file  . Stress: Not on file  Relationships  . Social connections:    Talks on phone: Not on file    Gets together: Not on file    Attends religious service: Not on file    Active member of club or organization: Not on file    Attends meetings of clubs or organizations: Not on file    Relationship status: Not on file  . Intimate partner violence:    Fear of current or ex partner: Not on file    Emotionally abused: Not on file    Physically abused: Not on file    Forced sexual activity: Not on file  Other Topics Concern  . Not on file  Social History Narrative  . Not on file    Past Surgical History:  Procedure Laterality Date  . ABDOMINAL AORTIC ANEURYSM REPAIR     stenting   . CARDIAC CATHETERIZATION  02/2010   r/t NSTEMI; loss of SVG to RCA  . CATARACT EXTRACTION    . COLONOSCOPY WITH PROPOFOL N/A 02/04/2015   Procedure: COLONOSCOPY WITH PROPOFOL;  Surgeon: Charlott Rakes, MD;  Location: [facility] ENDOSCOPY;  Service: Endoscopy;  Laterality: N/A;  . CORONARY ARTERY BYPASS GRAFT  12/1995   x5; LIMA to ALD, vein graft to RCA (now occluded),SVG to OM, SVG to diagonal  . ESOPHAGOGASTRODUODENOSCOPY N/A  01/28/2015   Procedure: ESOPHAGOGASTRODUODENOSCOPY (EGD);  Surgeon: Vida Rigger, MD;  Location: [facility] ENDOSCOPY;  Service: Endoscopy;  Laterality: N/A;  . INGUINAL HERNIA REPAIR     Left inguinal  . TRANSTHORACIC ECHOCARDIOGRAM  04/2011   EF 55-60%; mild LVH; grade 1 diastolic dysfunction    Family History  Problem Relation Age of Onset  . Heart disease Mother   . Heart disease Father   . Stroke Father   . Heart disease Sister   . Macular degeneration Sister   . Heart disease Sister   . Stroke Brother   . Hypertension Sister   . Heart Problems Sister        x2  Allergies  Allergen Reactions  . Cilostazol Other (See Comments)    Adverse reaction per patient - leg cramps  . Doxazosin Other (See Comments)    Leg cramps    Current Outpatient Medications on File Prior to Visit  Medication Sig Dispense Refill  . acetaminophen (TYLENOL) 325 MG tablet Take 650 mg by mouth every 6 (six) hours as needed for moderate pain.     Marland Kitchen atorvastatin (LIPITOR) 20 MG tablet TAKE 1 TABLET BY MOUTH ONCE DAILY 90 tablet 0  . docusate sodium (COLACE) 100 MG capsule Take 100 mg by mouth daily before supper.     Marland Kitchen ELIQUIS 5 MG TABS tablet TAKE 1 TABLET BY MOUTH TWICE DAILY 180 tablet 3  . Ferrous Gluconate (IRON) 240 (27 Fe) MG TABS Take 1 tablet by mouth daily.    . furosemide (LASIX) 20 MG tablet Take 1 tablet (20 mg total) by mouth 2 (two) times daily. 180 tablet 3  . metoprolol tartrate (LOPRESSOR) 25 MG tablet TAKE 1 TABLET BY MOUTH TWICE DAILY 180 tablet 2  . Multiple Vitamin (MULTIVITAMIN WITH MINERALS) TABS tablet Take 1 tablet by mouth daily.    . nitroGLYCERIN (NITROSTAT) 0.4 MG SL tablet Place 1 tablet (0.4 mg total) under the tongue every 5 (five) minutes as needed. FOR CHEST PAIN (Patient taking differently: Place 0.4 mg under the tongue every 5 (five) minutes as needed for chest pain. ) 25 tablet 6  . pantoprazole (PROTONIX) 40 MG tablet TAKE 1 TABLET BY MOUTH ONCE DAILY 60 tablet 4  .  PRESCRIPTION MEDICATION Inhale into the lungs at bedtime. CPAP - pressure is 10    . tamsulosin (FLOMAX) 0.4 MG CAPS capsule TAKE 1 CAPSULE BY MOUTH ONCE DAILY IN THE EVENING. 90 capsule 2  . [DISCONTINUED] carbidopa-levodopa (SINEMET CR) 25-100 MG per tablet Take by mouth. 1/2 tablet at bedtime      No current facility-administered medications on file prior to visit.     BP 124/60   Temp (!) 97.5 F (36.4 C) (Oral)   Wt 201 lb (91.2 kg)   BMI 35.61 kg/m       Objective:   Physical Exam  Constitutional: He is oriented to person, place, and time. He appears well-developed and well-nourished. No distress.  HENT:  Head: Normocephalic and atraumatic.  Right Ear: External ear normal.  Left Ear: External ear normal.  Nose: Nose normal.  Mouth/Throat: Oropharynx is clear and moist. No oropharyngeal exudate.  Eyes: Pupils are equal, round, and reactive to light. Conjunctivae and EOM are normal. Right eye exhibits no discharge. Left eye exhibits no discharge. No scleral icterus.  Neck: Normal range of motion. Neck supple. No JVD present. No tracheal deviation present. No thyromegaly present.  Cardiovascular: Normal rate, regular rhythm, normal heart sounds and intact distal pulses. Exam reveals no gallop and no friction rub.  No murmur heard. Pulmonary/Chest: Effort normal and breath sounds normal. No stridor. No respiratory distress. He has no wheezes. He has no rales. He exhibits no tenderness.  Abdominal: Soft. Bowel sounds are normal. He exhibits no distension and no mass. There is no tenderness. There is no rebound and no guarding. No hernia.  Musculoskeletal: Normal range of motion. He exhibits edema. He exhibits no tenderness or deformity.  + 2 pitting edema in bilateral lower extremities   Lymphadenopathy:    He has no cervical adenopathy.  Neurological: He is alert and oriented to person, place, and time. He displays normal reflexes. No cranial nerve deficit or  sensory deficit. He  exhibits normal muscle tone. Coordination normal.  Skin: Skin is warm and dry. Capillary refill takes less than 2 seconds. No rash noted. He is not diaphoretic. No erythema. No pallor.  Psychiatric: He has a normal mood and affect. His behavior is normal. Judgment and thought content normal.  Nursing note and vitals reviewed.     Assessment & Plan:  1. Routine general medical examination at a health care facility - High dose flu given  - 6 months follow up or sooner if needed - Basic metabolic panel - CBC with Differential/Platelet - Hepatic function panel - Lipid panel - TSH  2. Dyslipidemia - Refill Lipitor  - Basic metabolic panel - CBC with Differential/Platelet - Hepatic function panel - Lipid panel - TSH  3. Essential hypertension - Well controlled. No change in medication  - Basic metabolic panel - CBC with Differential/Platelet - Hepatic function panel - Lipid panel - TSH  4. OSA on CPAP - Continue to use CPAP   5. Paroxysmal atrial fibrillation (HCC) - Rate controlled.  - Basic metabolic panel - CBC with Differential/Platelet - Hepatic function panel - Lipid panel - TSH   Shirline Frees, NP

## 2017-12-15 ENCOUNTER — Ambulatory Visit: Payer: Medicare Other | Admitting: Internal Medicine

## 2017-12-18 ENCOUNTER — Ambulatory Visit: Payer: Self-pay

## 2017-12-18 ENCOUNTER — Encounter: Payer: Self-pay | Admitting: Family Medicine

## 2017-12-18 ENCOUNTER — Ambulatory Visit: Payer: Medicare Other | Admitting: Family Medicine

## 2017-12-18 VITALS — BP 124/82 | HR 94 | Temp 97.8°F | Resp 16 | Ht 63.0 in | Wt 201.0 lb

## 2017-12-18 DIAGNOSIS — N401 Enlarged prostate with lower urinary tract symptoms: Secondary | ICD-10-CM

## 2017-12-18 DIAGNOSIS — R4182 Altered mental status, unspecified: Secondary | ICD-10-CM

## 2017-12-18 DIAGNOSIS — R350 Frequency of micturition: Secondary | ICD-10-CM

## 2017-12-18 LAB — POCT URINALYSIS DIPSTICK
BILIRUBIN UA: NEGATIVE
GLUCOSE UA: NEGATIVE
Ketones, UA: NEGATIVE
Leukocytes, UA: NEGATIVE
Nitrite, UA: NEGATIVE
Odor: NEGATIVE
PH UA: 7 (ref 5.0–8.0)
Protein, UA: NEGATIVE
RBC UA: NEGATIVE
Spec Grav, UA: 1.015 (ref 1.010–1.025)
UROBILINOGEN UA: 0.2 U/dL

## 2017-12-18 LAB — GLUCOSE, POCT (MANUAL RESULT ENTRY): POC Glucose: 104 mg/dl — AB (ref 70–99)

## 2017-12-18 MED ORDER — SULFAMETHOXAZOLE-TRIMETHOPRIM 800-160 MG PO TABS: 1.0000 | ORAL_TABLET | Freq: Two times a day (BID) | ORAL | 0 refills | Status: DC

## 2017-12-18 NOTE — Telephone Encounter (Signed)
Noted  

## 2017-12-18 NOTE — Telephone Encounter (Signed)
Pt.'s son reports yesterday around 5 p.m., he noticed pt. Having frequency, urgency with voiding. Has noticed "some confusion which he gets with a bladder infection." Reports he did a home test for UTI which was positive. Appointment made for today. No availability with Shirline Frees.  Reason for Disposition . Urinating more frequently than usual (i.e., frequency)  Answer Assessment - Initial Assessment Questions 1. SYMPTOM: "What's the main symptom you're concerned about?" (e.g., frequency, incontinence)     Frequency, confusion 2. ONSET: "When did the  Frequency  start?"     yesterday 3. PAIN: "Is there any pain?" If so, ask: "How bad is it?" (Scale: 1-10; mild, moderate, severe)     Some 4. CAUSE: "What do you think is causing the symptoms?"     UTI 5. OTHER SYMPTOMS: "Do you have any other symptoms?" (e.g., fever, flank pain, blood in urine, pain with urination)     Some confusion 6. PREGNANCY: "Is there any chance you are pregnant?" "When was your last menstrual period?"     N/A  Protocols used: URINARY [facility]

## 2017-12-18 NOTE — Patient Instructions (Addendum)
  Corey Mcdonald I have seen you today for an acute visit.  A few things to remember from today's visit:   Frequent urination - Plan: POC Urinalysis Dipstick, POC Glucose (CBG), sulfamethoxazole-trimethoprim (BACTRIM DS,SEPTRA DS) 800-160 MG tablet, CBC with Differential, Urine culture  Benign prostatic hyperplasia with urinary frequency - Plan: POC Glucose (CBG)  Altered mental status, unspecified altered mental status type - Plan: CBC with Differential   If medications prescribed today, they will not be refill upon request, a follow up appointment with PCP will be necessary to discuss continuation of of treatment if appropriate.     In general please monitor for signs of worsening symptoms and seek immediate medical attention if any concerning.  If symptoms are not resolved in 5-7 days you should schedule a follow up appointment with your doctor, before if needed.  I hope you get better soon!

## 2017-12-18 NOTE — Progress Notes (Signed)
ACUTE VISIT   HPI:  Chief Complaint  Patient presents with  . Urinary Frequency    started yesterday  . More confused than normal    Mr.Talton Dykman is a 84 y.o. male, who is here today with his son, who is concerned about [name] being "more confused than usual."  He is visiting from [place]. Mr. Blahnik lives alone in independent living, daughter lives in town.  His own has noted that he has had increased urinary frequency since yesterday, started around 4 AM. He denies dysuria, gross hematuria, abdominal pain, urgency, rectal pain, or urethral discharge. He is not sure about exacerbating or alleviating factors.  His son is concerned about possible UTI. In the past episodes of confusion has been caused by UTIs.  According to patient's son, home testing done today in his urine show a "trace" of infection I would like to be able to start him on antibiotics before he is going back to [place].  His son states that a few months ago he was treated for UTI with 3 days course of antibiotics, after his daughter called reporting urinary frequency.  His son will also like to have his glucose checked here in the office.  No history of DM. No changes in appetite.   Hx of BPH. Currently he is on Flomax 0.4 mg daily. Lab Results  Component Value Date   PSA 2.74 06/13/2006    He also has history of lower extremity edema, he is on furosemide 20 mg twice daily. His own has not noted chest pain, dyspnea, or worsening lower extremity edema.  Son denies history of dementia.  Review of Systems  Constitutional: Negative for appetite change, fatigue and fever.  HENT: Negative for mouth sores and trouble swallowing.   Respiratory: Negative for shortness of breath and wheezing.   Cardiovascular: Positive for leg swelling. Negative for chest pain and palpitations.  Gastrointestinal: Negative for abdominal pain, nausea and vomiting.       No changes in bowel habits.  Genitourinary:  Positive for frequency. Negative for decreased urine volume, discharge, dysuria, flank pain, hematuria, scrotal swelling and urgency.  Musculoskeletal: Positive for gait problem (Chronic). Negative for myalgias.  Neurological: Negative for syncope, weakness and headaches.  Psychiatric/Behavioral: Positive for confusion and sleep disturbance.      Current Outpatient Medications on File Prior to Visit  Medication Sig Dispense Refill  . acetaminophen (TYLENOL) 325 MG tablet Take 650 mg by mouth every 6 (six) hours as needed for moderate pain.     Marland Kitchen atorvastatin (LIPITOR) 20 MG tablet Take 1 tablet (20 mg total) by mouth daily. 90 tablet 3  . docusate sodium (COLACE) 100 MG capsule Take 100 mg by mouth daily before supper.     Marland Kitchen ELIQUIS 5 MG TABS tablet TAKE 1 TABLET BY MOUTH TWICE DAILY 180 tablet 3  . Ferrous Gluconate (IRON) 240 (27 Fe) MG TABS Take 1 tablet by mouth daily.    . furosemide (LASIX) 20 MG tablet Take 1 tablet (20 mg total) by mouth 2 (two) times daily. 180 tablet 3  . metoprolol tartrate (LOPRESSOR) 25 MG tablet TAKE 1 TABLET BY MOUTH TWICE DAILY 180 tablet 2  . Multiple Vitamin (MULTIVITAMIN WITH MINERALS) TABS tablet Take 1 tablet by mouth daily.    . nitroGLYCERIN (NITROSTAT) 0.4 MG SL tablet Place 1 tablet (0.4 mg total) under the tongue every 5 (five) minutes as needed. FOR CHEST PAIN (Patient taking differently: Place 0.4 mg under the  tongue every 5 (five) minutes as needed for chest pain. ) 25 tablet 6  . pantoprazole (PROTONIX) 40 MG tablet TAKE 1 TABLET BY MOUTH ONCE DAILY 60 tablet 4  . PRESCRIPTION MEDICATION Inhale into the lungs at bedtime. CPAP - pressure is 10    . tamsulosin (FLOMAX) 0.4 MG CAPS capsule TAKE 1 CAPSULE BY MOUTH ONCE DAILY IN THE EVENING. 90 capsule 2  . [DISCONTINUED] carbidopa-levodopa (SINEMET CR) 25-100 MG per tablet Take by mouth. 1/2 tablet at bedtime      No current facility-administered medications on file prior to visit.      Past  Medical History:  Diagnosis Date  . AAA (abdominal aortic aneurysm) (HCC)   . ABDOMINAL AORTIC ANEURYSM   . Anemia January 2012   admitted with acute MI March 15, 2010 and has significant acute blood loss anemia requiring transfusions of two units of packed RBCs.  Status post upper endoscopy March 22, 2010 without High-Risk bleeding lesion.  History of peptic ulcer disease  . Atrial fibrillation (HCC)   . BENIGN PROSTATIC HYPERTROPHY, HX OF    . COLONIC POLYPS, HX OF   . CORONARY ARTERY DISEASE    non-ST segment MI, March 15, 2010  . History of Doppler ultrasound 05/2011   h/o claudication, stable ABIs  . History of nuclear stress test 2007   persantine; normal, low risk   . HYPERLIPIDEMIA   . HYPERTENSION   . MACULAR DEGENERATION   . Myocardial infarction (HCC) 02/2010  . PAD (peripheral artery disease) (HCC)   . PEPTIC ULCER DISEASE, HX OF   . PVD (peripheral vascular disease) (HCC)   . RESTLESS LEGS SYNDROME   . SLEEP APNEA   . VERTIGO    Allergies  Allergen Reactions  . Cilostazol Other (See Comments)    Adverse reaction per patient - leg cramps  . Doxazosin Other (See Comments)    Leg cramps    Social History   Socioeconomic History  . Marital status: Married    Spouse name: Not on file  . Number of children: 2  . Years of education: Not on file  . Highest education level: Not on file  Occupational History  . Not on file  Social Needs  . Financial resource strain: Not on file  . Food insecurity:    Worry: Not on file    Inability: Not on file  . Transportation needs:    Medical: Not on file    Non-medical: Not on file  Tobacco Use  . Smoking status: Former Smoker    Packs/day: 1.00    Years: 40.00    Pack years: 40.00    Types: Cigarettes    Last attempt to quit: 02/22/1987    Years since quitting: 30.8  . Smokeless tobacco: Never Used  Substance and Sexual Activity  . Alcohol use: No  . Drug use: No  . Sexual activity: Not on file  Lifestyle    . Physical activity:    Days per week: Not on file    Minutes per session: Not on file  . Stress: Not on file  Relationships  . Social connections:    Talks on phone: Not on file    Gets together: Not on file    Attends religious service: Not on file    Active member of club or organization: Not on file    Attends meetings of clubs or organizations: Not on file    Relationship status: Not on file  Other Topics Concern  .  Not on file  Social History Narrative  . Not on file    Vitals:   12/18/17 1456  BP: 124/82  Pulse: 94  Resp: 16  Temp: 97.8 F (36.6 C)  SpO2: 96%   Body mass index is 35.61 kg/m.   Physical Exam  Nursing note and vitals reviewed. Constitutional: He appears well-developed. No distress.  HENT:  Head: Normocephalic and atraumatic.  Mouth/Throat: Oropharynx is clear and moist and mucous membranes are normal.  Eyes: Pupils are equal, round, and reactive to light. Conjunctivae are normal.  Cardiovascular: An irregular rhythm present.  Murmur (II/VI SEM RUSB) heard. Respiratory: Effort normal and breath sounds normal. No respiratory distress.  GI: Soft. He exhibits no mass. There is no tenderness. There is no CVA tenderness.  Musculoskeletal: He exhibits edema (+2 pitting LE edema, bilateral.). He exhibits no tenderness.  Lymphadenopathy:    He has no cervical adenopathy.  Neurological: He is alert. Gait abnormal.  No focal deficit appreciated. Oriented in time and place. Remembers month and date, today his birthday.  He does not remember the year.  Skin: Skin is warm. No erythema.  Psychiatric: He has a normal mood and affect.  Well-groomed, poor eye contact.      ASSESSMENT AND PLAN:   Mr. Eesa was seen today for urinary frequency and more confused than normal.  Diagnoses and all orders for this visit:  Lab Results  Component Value Date   WBC 8.2 12/18/2017   HGB 9.8 (L) 12/18/2017   HCT 29.1 (L) 12/18/2017   MCV 90.1 12/18/2017    PLT 266.0 12/18/2017   Lab Results  Component Value Date   CREATININE 2.09 (H) 12/18/2017   BUN 41 (H) 12/18/2017   NA 138 12/18/2017   K 5.0 12/18/2017   CL 100 12/18/2017   CO2 29 12/18/2017    Frequent urination  We discussed possible etiologies, including acute prostatitis.  We will treat empirically for UTI even though urine dipstick was negative.        Last e GFR 38.  If needed Bactrim dose will be adjusted according to renal function. We will follow urine culture.  -     POC Urinalysis Dipstick -     POC Glucose (CBG) -     sulfamethoxazole-trimethoprim (BACTRIM DS,SEPTRA DS) 800-160 MG tablet; Take 1 tablet by mouth 2 (two) times daily. -     CBC with Differential -     Urine culture  Benign prostatic hyperplasia with urinary frequency  He has not had dysuria or other urinary symptoms. Urinary frequency could be related to BPH. No changes in Flomax. Diuretic could be aggravating problem.  -     POC Glucose (CBG)  Altered mental status, unspecified altered mental status type  Possible etiology discussed. He is disoriented in time but overall stable, I do not think ER evaluation is necessary at this time. Son was instructed about warning signs. Further recommendation will be given according to lab results.  -     CBC with Differential -     Basic metabolic panel       Return if symptoms worsen or fail to improve.       Terriana Barreras G. [place], MD  [facility]. [place] office.

## 2017-12-19 LAB — CBC WITH DIFFERENTIAL/PLATELET
BASOS PCT: 0.9 % (ref 0.0–3.0)
Basophils Absolute: 0.1 10*3/uL (ref 0.0–0.1)
EOS PCT: 2.6 % (ref 0.0–5.0)
Eosinophils Absolute: 0.2 10*3/uL (ref 0.0–0.7)
HEMATOCRIT: 29.1 % — AB (ref 39.0–52.0)
HEMOGLOBIN: 9.8 g/dL — AB (ref 13.0–17.0)
LYMPHS PCT: 12.4 % (ref 12.0–46.0)
Lymphs Abs: 1 10*3/uL (ref 0.7–4.0)
MCHC: 33.6 g/dL (ref 30.0–36.0)
MCV: 90.1 fl (ref 78.0–100.0)
Monocytes Absolute: 0.8 10*3/uL (ref 0.1–1.0)
Monocytes Relative: 10 % (ref 3.0–12.0)
Neutro Abs: 6.1 10*3/uL (ref 1.4–7.7)
Neutrophils Relative %: 74.1 % (ref 43.0–77.0)
Platelets: 266 10*3/uL (ref 150.0–400.0)
RBC: 3.23 Mil/uL — ABNORMAL LOW (ref 4.22–5.81)
RDW: 16.4 % — AB (ref 11.5–15.5)
WBC: 8.2 10*3/uL (ref 4.0–10.5)

## 2017-12-19 LAB — BASIC METABOLIC PANEL
BUN: 41 mg/dL — ABNORMAL HIGH (ref 6–23)
CALCIUM: 9.2 mg/dL (ref 8.4–10.5)
CO2: 29 meq/L (ref 19–32)
Chloride: 100 mEq/L (ref 96–112)
Creatinine, Ser: 2.09 mg/dL — ABNORMAL HIGH (ref 0.40–1.50)
GFR: 31.73 mL/min — ABNORMAL LOW (ref >60.00)
Glucose, Bld: 104 mg/dL — ABNORMAL HIGH (ref 70–99)
Potassium: 5 mEq/L (ref 3.5–5.1)
SODIUM: 138 meq/L (ref 135–145)

## 2017-12-19 LAB — URINE CULTURE
MICRO NUMBER:: 91293702
RESULT: NO GROWTH
SPECIMEN QUALITY:: ADEQUATE

## 2017-12-20 ENCOUNTER — Other Ambulatory Visit: Payer: Self-pay | Admitting: *Deleted

## 2017-12-20 MED ORDER — FUROSEMIDE 20 MG PO TABS: ORAL_TABLET | ORAL | 3 refills | Status: DC

## 2018-01-05 ENCOUNTER — Encounter: Payer: Self-pay | Admitting: Internal Medicine

## 2018-01-05 ENCOUNTER — Ambulatory Visit: Payer: Medicare Other | Admitting: Internal Medicine

## 2018-01-05 ENCOUNTER — Ambulatory Visit: Payer: Self-pay | Admitting: Adult Health

## 2018-01-05 VITALS — BP 112/70 | HR 82 | Ht 63.0 in | Wt 206.0 lb

## 2018-01-05 DIAGNOSIS — I5033 Acute on chronic diastolic (congestive) heart failure: Secondary | ICD-10-CM

## 2018-01-05 DIAGNOSIS — I482 Chronic atrial fibrillation, unspecified: Secondary | ICD-10-CM

## 2018-01-05 DIAGNOSIS — Z79899 Other long term (current) drug therapy: Secondary | ICD-10-CM | POA: Diagnosis not present

## 2018-01-05 DIAGNOSIS — Z951 Presence of aortocoronary bypass graft: Secondary | ICD-10-CM | POA: Diagnosis not present

## 2018-01-05 DIAGNOSIS — I350 Nonrheumatic aortic (valve) stenosis: Secondary | ICD-10-CM

## 2018-01-05 MED ORDER — FUROSEMIDE 20 MG PO TABS: ORAL_TABLET | ORAL | 3 refills | Status: DC

## 2018-01-05 NOTE — Patient Instructions (Signed)
Medication Instructions:  INCREASE lasix to 40mg  in the morning and 20mg  in the afternoone If you need a refill on your cardiac medications before your next appointment, please call your pharmacy.   Lab work: BMET (non-fasting) in 2 weeks If you have labs (blood work) drawn today and your tests are completely normal, you will receive your results only by: Marland Kitchen. MyChart Message (if you have MyChart) OR . A paper copy in the mail If you have any lab test that is abnormal or we need to change your treatment, we will call you to review the results.  Testing/Procedures: NONE  Follow-Up: At [facility], you and your health needs are our priority.  As part of our continuing mission to provide you with exceptional heart care, we have created designated Provider Care Teams.  These Care Teams include your primary Cardiologist (physician) and Advanced Practice Providers (APPs -  Physician Assistants and Nurse Practitioners) who all work together to provide you with the care you need, when you need it. You will need a follow up appointment in 4 weeks.  You may see Dr. Rennis GoldenHilty or one of the following Advanced Practice Providers on your designated Care Team: Azalee CourseHao Meng, [place]PA-C . Micah FlesherAngela Duke, PA-C  Any Other Special Instructions Will Be Listed Below (If Applicable).
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
## 2018-01-12 ENCOUNTER — Ambulatory Visit: Payer: Medicare Other | Admitting: Podiatry

## 2018-01-26 ENCOUNTER — Other Ambulatory Visit (INDEPENDENT_AMBULATORY_CARE_PROVIDER_SITE_OTHER): Payer: Medicare Other

## 2018-01-26 DIAGNOSIS — Z79899 Other long term (current) drug therapy: Secondary | ICD-10-CM

## 2018-01-26 DIAGNOSIS — I5030 Unspecified diastolic (congestive) heart failure: Secondary | ICD-10-CM

## 2018-01-26 LAB — BASIC METABOLIC PANEL
BUN: 35 mg/dL — ABNORMAL HIGH (ref 6–23)
CALCIUM: 8.9 mg/dL (ref 8.4–10.5)
CO2: 26 mEq/L (ref 19–32)
Chloride: 100 mEq/L (ref 96–112)
Creatinine, Ser: 1.79 mg/dL — ABNORMAL HIGH (ref 0.40–1.50)
GFR: 37.93 mL/min — ABNORMAL LOW (ref >60.00)
GLUCOSE: 121 mg/dL — AB (ref 70–99)
POTASSIUM: 4.8 meq/L (ref 3.5–5.1)
SODIUM: 136 meq/L (ref 135–145)

## 2018-02-01 ENCOUNTER — Other Ambulatory Visit: Payer: Self-pay | Admitting: Internal Medicine

## 2018-02-01 ENCOUNTER — Ambulatory Visit (INDEPENDENT_AMBULATORY_CARE_PROVIDER_SITE_OTHER): Payer: Medicare Other | Admitting: Podiatry

## 2018-02-01 DIAGNOSIS — I739 Peripheral vascular disease, unspecified: Secondary | ICD-10-CM | POA: Diagnosis not present

## 2018-02-01 DIAGNOSIS — L84 Corns and callosities: Secondary | ICD-10-CM

## 2018-02-01 DIAGNOSIS — M79674 Pain in right toe(s): Secondary | ICD-10-CM

## 2018-02-01 DIAGNOSIS — M79675 Pain in left toe(s): Secondary | ICD-10-CM | POA: Diagnosis not present

## 2018-02-01 DIAGNOSIS — B351 Tinea unguium: Secondary | ICD-10-CM

## 2018-02-01 NOTE — Patient Instructions (Signed)

## 2018-02-07 ENCOUNTER — Encounter: Payer: Self-pay | Admitting: Family Medicine

## 2018-02-07 ENCOUNTER — Other Ambulatory Visit: Payer: Self-pay | Admitting: Internal Medicine

## 2018-02-07 ENCOUNTER — Other Ambulatory Visit: Payer: Self-pay

## 2018-02-07 ENCOUNTER — Ambulatory Visit: Payer: Medicare Other | Admitting: Family Medicine

## 2018-02-07 VITALS — BP 100/58 | HR 86 | Ht 63.0 in | Wt 207.2 lb

## 2018-02-07 DIAGNOSIS — R0600 Dyspnea, unspecified: Secondary | ICD-10-CM | POA: Diagnosis not present

## 2018-02-07 DIAGNOSIS — R42 Dizziness and giddiness: Secondary | ICD-10-CM | POA: Diagnosis not present

## 2018-02-07 DIAGNOSIS — I482 Chronic atrial fibrillation, unspecified: Secondary | ICD-10-CM

## 2018-02-07 DIAGNOSIS — H109 Unspecified conjunctivitis: Secondary | ICD-10-CM | POA: Diagnosis not present

## 2018-02-07 DIAGNOSIS — N183 Chronic kidney disease, stage 3 unspecified: Secondary | ICD-10-CM

## 2018-02-07 DIAGNOSIS — D649 Anemia, unspecified: Secondary | ICD-10-CM

## 2018-02-07 DIAGNOSIS — R252 Cramp and spasm: Secondary | ICD-10-CM

## 2018-02-07 MED ORDER — TAMSULOSIN HCL 0.4 MG PO CAPS: ORAL_CAPSULE | ORAL | 3 refills | Status: DC

## 2018-02-07 MED ORDER — TOBRAMYCIN 0.3 % OP SOLN: 2.0000 [drp] | OPHTHALMIC | 0 refills | Status: DC

## 2018-02-07 NOTE — Progress Notes (Signed)
Subjective:     Patient ID: Corey Mcdonald, male   DOB: 02/27/1923, 84 y.o.   MRN: [id number]  HPI Patient has complex past medical history and is seen today as a work-in in the absence of his regular primary who is out of town with several issues as follows.  His son (who just flew in from [place]) is here with him.  Their initial chief complaint was increased bilateral eye drainage and crusting past several days.  He has history of macular degeneration.  Impaired vision at baseline.  Son states that over the past week or so there have been reports that he is little more "pale" than usual and has been somewhat dizzy and lightheaded.  Has had some increased dyspnea over baseline.  Frequent leg cramps.  Patient is a very poor historian.  Patient's son states that he got like this once before with UTI.  Patient denies any burning with urination.  Multiple chronic problems including history of hypertension, A. fib, CAD, diastolic heart failure, history of TIA, history of abdominal aortic aneurysm, sleep apnea, chronic kidney disease, history of what sounds like chronic anemia.  Apparently had full upper and lower GI endoscopy previously which did not reveal source of blood loss.  Recent bump in creatinine to 2.0 (after increase in Lasix dose) and repeat 1.79.  Most recent hemoglobin 9.8 which is down from his usual baseline.  He is on several medications including Eliquis 5 mg twice daily, Lipitor, Lasix, metoprolol, Flomax.  Most recent GFR 37.9  Past Medical History:  Diagnosis Date  . AAA (abdominal aortic aneurysm) (HCC)   . ABDOMINAL AORTIC ANEURYSM   . Anemia January 2012   admitted with acute MI March 15, 2010 and has significant acute blood loss anemia requiring transfusions of two units of packed RBCs.  Status post upper endoscopy March 22, 2010 without High-Risk bleeding lesion.  History of peptic ulcer disease  . Atrial fibrillation (HCC)   . BENIGN PROSTATIC HYPERTROPHY, HX OF     . COLONIC POLYPS, HX OF   . CORONARY ARTERY DISEASE    non-ST segment MI, March 15, 2010  . History of Doppler ultrasound 05/2011   h/o claudication, stable ABIs  . History of nuclear stress test 2007   persantine; normal, low risk   . HYPERLIPIDEMIA   . HYPERTENSION   . MACULAR DEGENERATION   . Myocardial infarction (HCC) 02/2010  . PAD (peripheral artery disease) (HCC)   . PEPTIC ULCER DISEASE, HX OF   . PVD (peripheral vascular disease) (HCC)   . RESTLESS LEGS SYNDROME   . SLEEP APNEA   . VERTIGO    Past Surgical History:  Procedure Laterality Date  . ABDOMINAL AORTIC ANEURYSM REPAIR     stenting   . CARDIAC CATHETERIZATION  02/2010   r/t NSTEMI; loss of SVG to RCA  . CATARACT EXTRACTION    . COLONOSCOPY WITH PROPOFOL N/A 02/04/2015   Procedure: COLONOSCOPY WITH PROPOFOL;  Surgeon: Charlott Rakes, MD;  Location: [facility] ENDOSCOPY;  Service: Endoscopy;  Laterality: N/A;  . CORONARY ARTERY BYPASS GRAFT  12/1995   x5; LIMA to ALD, vein graft to RCA (now occluded),SVG to OM, SVG to diagonal  . ESOPHAGOGASTRODUODENOSCOPY N/A 01/28/2015   Procedure: ESOPHAGOGASTRODUODENOSCOPY (EGD);  Surgeon: Vida Rigger, MD;  Location: [facility] ENDOSCOPY;  Service: Endoscopy;  Laterality: N/A;  . INGUINAL HERNIA REPAIR     Left inguinal  . TRANSTHORACIC ECHOCARDIOGRAM  04/2011   EF 55-60%; mild LVH; grade 1 diastolic dysfunction  reports that he quit smoking about 30 years ago. His smoking use included cigarettes. He has a 40.00 pack-year smoking history. He has never used smokeless tobacco. He reports that he does not drink alcohol or use drugs. family history includes Heart Problems in his sister; Heart disease in his father, mother, sister, and sister; Hypertension in his sister; Macular degeneration in his sister; Stroke in his brother and father. Allergies  Allergen Reactions  . Cilostazol Other (See Comments)    Adverse reaction per patient - leg cramps  . Doxazosin Other (See Comments)    Leg  cramps     Review of Systems  Constitutional: Positive for fatigue. Negative for chills and fever.  Respiratory: Positive for shortness of breath. Negative for cough and wheezing.   Cardiovascular: Positive for leg swelling. Negative for chest pain.  Gastrointestinal: Negative for abdominal pain, blood in stool, nausea and vomiting.  Genitourinary: Negative for dysuria.  Neurological: Positive for dizziness. Negative for seizures, syncope and headaches.       Objective:   Physical Exam Constitutional:      Comments: Patient is cooperative but does not make eye contact and very little direct interaction  Eyes:     Comments: He has thick yellow crusted drainage bilaterally from both eyes  Neck:     Musculoskeletal: Neck supple.  Cardiovascular:     Comments: Irregular heart rhythm with rate in the 80s Pulmonary:     Effort: Pulmonary effort is normal.     Breath sounds: Normal breath sounds.  Musculoskeletal:     Comments: Bilateral leg edema 1+ pitting        Assessment:     #1 bilateral bacterial conjunctivitis  #2 history of chronic anemia with etiology unclear.  Patient reportedly taking iron 1 daily  #3 recent multiple nonspecific symptoms including dizziness, pallor, dyspnea, weakness  #4 chronic kidney disease  #5 history of chronic diastolic heart failure  #6 chronic atrial fibrillation on Eliquis  #7 history of BPH  #8 reported dyspnea.  No respiratory distress at rest.  ?diastolic failure.  Weight stable.  No fever or cough.    Plan:     -Refilled Flomax per family request -Check further labs with urinalysis, CBC, comprehensive metabolic panel, ferritin, TIBC, and iron level -Tobrex eyedrops 2 drops each eye every 4 hours while awake -Recommend warm compresses to both eyes several times daily -?  Consider reduce Eliquis to 2.5 mg twice daily based on age greater than 80 and creatinine over 1.5-defer to cardiology  Kristian CoveyBruce W Lesly Pontarelli MD [place]  [facility]

## 2018-02-07 NOTE — Patient Instructions (Signed)
Bacterial Conjunctivitis, Adult  Bacterial conjunctivitis is an infection of the clear membrane that covers the white part of your eye and the inner surface of your eyelid (conjunctiva). When the blood vessels in your conjunctiva become inflamed, your eye becomes red or pink, and it will probably feel itchy. Bacterial conjunctivitis spreads very easily from person to person (is contagious). It also spreads easily from one eye to the other eye.  What are the causes?  This condition is caused by bacteria. You may get the infection if you come into close contact with:   A person who is infected with the bacteria.   Items that are contaminated with the bacteria, such as a face towel, contact lens solution, or eye makeup.  What increases the risk?  You are more likely to develop this condition if you:   Are exposed to other people who have the infection.   Wear contact lenses.   Have a sinus infection.   Have had a recent eye injury or surgery.   Have a weak body defense system (immune system).   Have a medical condition that causes dry eyes.  What are the signs or symptoms?  Symptoms of this condition include:   Thick, yellowish discharge from the eye. This may turn into a crust on the eyelid overnight and cause your eyelids to stick together.   Tearing or watery eyes.   Itchy eyes.   Burning feeling in your eyes.   Eye redness.   Swollen eyelids.   Blurred vision.  How is this diagnosed?  This condition is diagnosed based on your symptoms and medical history. Your health care provider may also take a sample of discharge from your eye to find the cause of your infection. This is rarely done.  How is this treated?  This condition may be treated with:   Antibiotic eye drops or ointment to clear the infection more quickly and prevent the spread of infection to others.   Oral antibiotic medicines to treat infections that do not respond to drops or ointments or that last longer than 10 days.   Cool, wet  cloths (cool compresses) placed on the eyes.   Artificial tears applied 2-6 times a day.  Follow these instructions at home:  Medicines   Take or apply your antibiotic medicine as told by your health care provider. Do not stop taking or applying the antibiotic even if you start to feel better.   Take or apply over-the-counter and prescription medicines only as told by your health care provider.   Be very careful to avoid touching the edge of your eyelid with the eye-drop bottle or the ointment tube when you apply medicines to the affected eye. This will keep you from spreading the infection to your other eye or to other people.  Managing discomfort   Gently wipe away any drainage from your eye with a warm, wet washcloth or a cotton ball.   Apply a clean, cool compress to your eye for 10-20 minutes, 3-4 times a day.  General instructions   Do not wear contact lenses until the inflammation is gone and your health care provider says it is safe to wear them again. Ask your health care provider how to sterilize or replace your contact lenses before you use them again. Wear glasses until you can resume wearing contact lenses.   Avoid wearing eye makeup until the inflammation is gone. Throw away any old eye cosmetics that may be contaminated.   Change or wash   your pillowcase every day.   Do not share towels or washcloths. This may spread the infection.   Wash your hands often with soap and water. Use paper towels to dry your hands.   Avoid touching or rubbing your eyes.   Do not drive or use heavy machinery if your vision is blurred.  Contact a health care provider if:   You have a fever.   Your symptoms do not get better after 10 days.  Get help right away if you have:   A fever and your symptoms suddenly get worse.   Severe pain when you move your eye.   Facial pain, redness, or swelling.   Sudden loss of vision.  Summary   Bacterial conjunctivitis is an infection of the clear membrane that covers the  white part of your eye and the inner surface of your eyelid (conjunctiva).   Bacterial conjunctivitis spreads very easily from person to person (is contagious).   Wash your hands often with soap and water. Use paper towels to dry your hands.   Take or apply your antibiotic medicine as told by your health care provider. Do not stop taking or applying the antibiotic even if you start to feel better.   Contact a health care provider if you have a fever or your symptoms do not get better after 10 days.  This information is not intended to replace advice given to you by your health care provider. Make sure you discuss any questions you have with your health care provider.  Document Released: 02/07/2005 Document Revised: 09/13/2017 Document Reviewed: 09/13/2017  Elsevier Interactive Patient Education  2019 [employer]

## 2018-02-08 ENCOUNTER — Other Ambulatory Visit: Payer: Self-pay

## 2018-02-08 ENCOUNTER — Ambulatory Visit (INDEPENDENT_AMBULATORY_CARE_PROVIDER_SITE_OTHER): Payer: Medicare Other | Admitting: Family

## 2018-02-08 ENCOUNTER — Encounter: Payer: Self-pay | Admitting: Family

## 2018-02-08 ENCOUNTER — Ambulatory Visit (INDEPENDENT_AMBULATORY_CARE_PROVIDER_SITE_OTHER)
Admission: RE | Admit: 2018-02-08 | Discharge: 2018-02-08 | Disposition: A | Discharge status: Home / Self Care | Payer: Medicare Other | Source: Ambulatory Visit | Attending: Family | Admitting: Family

## 2018-02-08 ENCOUNTER — Telehealth: Payer: Self-pay | Admitting: *Deleted

## 2018-02-08 ENCOUNTER — Inpatient Hospital Stay (HOSPITAL_COMMUNITY)
Admission: EM | Admit: 2018-02-08 | Discharge: 2018-02-11 | DRG: 812 | Disposition: A | Discharge status: Home Health | Payer: Medicare Other | Attending: Internal Medicine | Admitting: Internal Medicine

## 2018-02-08 ENCOUNTER — Encounter (HOSPITAL_COMMUNITY): Payer: Self-pay

## 2018-02-08 VITALS — BP 120/69 | HR 107 | Temp 96.8°F | Resp 14 | Ht 63.0 in | Wt 208.0 lb

## 2018-02-08 DIAGNOSIS — I714 Abdominal aortic aneurysm, without rupture, unspecified: Secondary | ICD-10-CM | POA: Diagnosis present

## 2018-02-08 DIAGNOSIS — D62 Acute posthemorrhagic anemia: Principal | ICD-10-CM | POA: Diagnosis present

## 2018-02-08 DIAGNOSIS — H548 Legal blindness, as defined in USA: Secondary | ICD-10-CM | POA: Diagnosis present

## 2018-02-08 DIAGNOSIS — N184 Chronic kidney disease, stage 4 (severe): Secondary | ICD-10-CM | POA: Diagnosis not present

## 2018-02-08 DIAGNOSIS — I130 Hypertensive heart and chronic kidney disease with heart failure and stage 1 through stage 4 chronic kidney disease, or unspecified chronic kidney disease: Secondary | ICD-10-CM | POA: Diagnosis not present

## 2018-02-08 DIAGNOSIS — G2581 Restless legs syndrome: Secondary | ICD-10-CM | POA: Diagnosis present

## 2018-02-08 DIAGNOSIS — R609 Edema, unspecified: Secondary | ICD-10-CM

## 2018-02-08 DIAGNOSIS — I482 Chronic atrial fibrillation, unspecified: Secondary | ICD-10-CM | POA: Diagnosis present

## 2018-02-08 DIAGNOSIS — D509 Iron deficiency anemia, unspecified: Secondary | ICD-10-CM | POA: Diagnosis not present

## 2018-02-08 DIAGNOSIS — H9190 Unspecified hearing loss, unspecified ear: Secondary | ICD-10-CM | POA: Diagnosis present

## 2018-02-08 DIAGNOSIS — Z95828 Presence of other vascular implants and grafts: Secondary | ICD-10-CM | POA: Diagnosis not present

## 2018-02-08 DIAGNOSIS — K449 Diaphragmatic hernia without obstruction or gangrene: Secondary | ICD-10-CM | POA: Diagnosis present

## 2018-02-08 DIAGNOSIS — Z87891 Personal history of nicotine dependence: Secondary | ICD-10-CM

## 2018-02-08 DIAGNOSIS — H3530 Unspecified macular degeneration: Secondary | ICD-10-CM | POA: Diagnosis not present

## 2018-02-08 DIAGNOSIS — Z7901 Long term (current) use of anticoagulants: Secondary | ICD-10-CM | POA: Diagnosis not present

## 2018-02-08 DIAGNOSIS — I739 Peripheral vascular disease, unspecified: Secondary | ICD-10-CM | POA: Diagnosis present

## 2018-02-08 DIAGNOSIS — K222 Esophageal obstruction: Secondary | ICD-10-CM | POA: Diagnosis present

## 2018-02-08 DIAGNOSIS — D649 Anemia, unspecified: Secondary | ICD-10-CM

## 2018-02-08 DIAGNOSIS — N189 Chronic kidney disease, unspecified: Secondary | ICD-10-CM | POA: Diagnosis not present

## 2018-02-08 DIAGNOSIS — Z79899 Other long term (current) drug therapy: Secondary | ICD-10-CM | POA: Diagnosis not present

## 2018-02-08 DIAGNOSIS — E785 Hyperlipidemia, unspecified: Secondary | ICD-10-CM | POA: Diagnosis not present

## 2018-02-08 DIAGNOSIS — Z951 Presence of aortocoronary bypass graft: Secondary | ICD-10-CM

## 2018-02-08 DIAGNOSIS — D500 Iron deficiency anemia secondary to blood loss (chronic): Secondary | ICD-10-CM | POA: Diagnosis present

## 2018-02-08 DIAGNOSIS — I4891 Unspecified atrial fibrillation: Secondary | ICD-10-CM | POA: Diagnosis present

## 2018-02-08 DIAGNOSIS — R195 Other fecal abnormalities: Secondary | ICD-10-CM | POA: Diagnosis not present

## 2018-02-08 DIAGNOSIS — I252 Old myocardial infarction: Secondary | ICD-10-CM

## 2018-02-08 DIAGNOSIS — I5032 Chronic diastolic (congestive) heart failure: Secondary | ICD-10-CM | POA: Diagnosis present

## 2018-02-08 DIAGNOSIS — K922 Gastrointestinal hemorrhage, unspecified: Secondary | ICD-10-CM | POA: Diagnosis present

## 2018-02-08 DIAGNOSIS — N400 Enlarged prostate without lower urinary tract symptoms: Secondary | ICD-10-CM | POA: Diagnosis not present

## 2018-02-08 DIAGNOSIS — Z8669 Personal history of other diseases of the nervous system and sense organs: Secondary | ICD-10-CM

## 2018-02-08 DIAGNOSIS — I2510 Atherosclerotic heart disease of native coronary artery without angina pectoris: Secondary | ICD-10-CM | POA: Diagnosis present

## 2018-02-08 DIAGNOSIS — I10 Essential (primary) hypertension: Secondary | ICD-10-CM | POA: Diagnosis not present

## 2018-02-08 DIAGNOSIS — G4733 Obstructive sleep apnea (adult) (pediatric): Secondary | ICD-10-CM | POA: Diagnosis present

## 2018-02-08 DIAGNOSIS — I4581 Long QT syndrome: Secondary | ICD-10-CM | POA: Diagnosis not present

## 2018-02-08 DIAGNOSIS — Z8711 Personal history of peptic ulcer disease: Secondary | ICD-10-CM

## 2018-02-08 DIAGNOSIS — I5043 Acute on chronic combined systolic (congestive) and diastolic (congestive) heart failure: Secondary | ICD-10-CM | POA: Diagnosis not present

## 2018-02-08 LAB — CBC WITH DIFFERENTIAL/PLATELET
Abs Immature Granulocytes: 0.02 10*3/uL (ref 0.00–0.07)
BASOS PCT: 0.6 % (ref 0.0–3.0)
Basophils Absolute: 0 10*3/uL (ref 0.0–0.1)
Basophils Absolute: 0 10*3/uL (ref 0.0–0.1)
Basophils Relative: 1 %
Eosinophils Absolute: 0.2 10*3/uL (ref 0.0–0.7)
Eosinophils Absolute: 0.2 10*3/uL (ref 0.0–0.5)
Eosinophils Relative: 2.5 % (ref 0.0–5.0)
Eosinophils Relative: 3 %
HCT: 21.9 % — CL (ref 39.0–52.0)
HCT: 23.7 % — ABNORMAL LOW (ref 39.0–52.0)
Hemoglobin: 6.8 g/dL — CL (ref 13.0–17.0)
Hemoglobin: 6.8 g/dL — CL (ref 13.0–17.0)
Immature Granulocytes: 0 %
Lymphocytes Relative: 11.7 % — ABNORMAL LOW (ref 12.0–46.0)
Lymphocytes Relative: 12 %
Lymphs Abs: 0.8 10*3/uL (ref 0.7–4.0)
Lymphs Abs: 0.7 10*3/uL (ref 0.7–4.0)
MCH: 27.3 pg (ref 26.0–34.0)
MCHC: 31.2 g/dL (ref 30.0–36.0)
MCHC: 28.7 g/dL — ABNORMAL LOW (ref 30.0–36.0)
MCV: 90 fl (ref 78.0–100.0)
MCV: 95.2 fL (ref 80.0–100.0)
Monocytes Absolute: 0.9 10*3/uL (ref 0.1–1.0)
Monocytes Absolute: 0.7 10*3/uL (ref 0.1–1.0)
Monocytes Relative: 12.6 % — ABNORMAL HIGH (ref 3.0–12.0)
Monocytes Relative: 12 %
NEUTROS ABS: 5 10*3/uL (ref 1.4–7.7)
Neutro Abs: 4.4 10*3/uL (ref 1.7–7.7)
Neutrophils Relative %: 72.6 % (ref 43.0–77.0)
Neutrophils Relative %: 72 %
PLATELETS: 299 10*3/uL (ref 150.0–400.0)
PLATELETS: 308 10*3/uL (ref 150–400)
RBC: 2.44 Mil/uL — ABNORMAL LOW (ref 4.22–5.81)
RBC: 2.49 MIL/uL — ABNORMAL LOW (ref 4.22–5.81)
RDW: 17 % — ABNORMAL HIGH (ref 11.5–15.5)
RDW: 16 % — ABNORMAL HIGH (ref 11.5–15.5)
WBC: 6.9 10*3/uL (ref 4.0–10.5)
WBC: 6 10*3/uL (ref 4.0–10.5)
nRBC: 0 % (ref 0.0–0.2)

## 2018-02-08 LAB — COMPREHENSIVE METABOLIC PANEL
ALT: 17 U/L (ref 0–53)
ALT: 21 U/L (ref 0–44)
AST: 14 U/L (ref 0–37)
AST: 23 U/L (ref 15–41)
Albumin: 3.4 g/dL — ABNORMAL LOW (ref 3.5–5.2)
Albumin: 3.2 g/dL — ABNORMAL LOW (ref 3.5–5.0)
Alkaline Phosphatase: 63 U/L (ref 39–117)
Alkaline Phosphatase: 65 U/L (ref 38–126)
Anion gap: 13 (ref 5–15)
BUN: 40 mg/dL — ABNORMAL HIGH (ref 6–23)
BUN: 44 mg/dL — ABNORMAL HIGH (ref 8–23)
CALCIUM: 8.8 mg/dL (ref 8.4–10.5)
CO2: 27 mEq/L (ref 19–32)
CO2: 23 mmol/L (ref 22–32)
Calcium: 9.1 mg/dL (ref 8.9–10.3)
Chloride: 102 mEq/L (ref 96–112)
Chloride: 103 mmol/L (ref 98–111)
Creatinine, Ser: 1.92 mg/dL — ABNORMAL HIGH (ref 0.40–1.50)
Creatinine, Ser: 1.98 mg/dL — ABNORMAL HIGH (ref 0.61–1.24)
GFR calc non Af Amer: 28 mL/min — ABNORMAL LOW (ref >60)
GFR, EST AFRICAN AMERICAN: 33 mL/min — AB (ref >60)
GFR: 34.98 mL/min — ABNORMAL LOW (ref >60.00)
Glucose, Bld: 87 mg/dL (ref 70–99)
Glucose, Bld: 102 mg/dL — ABNORMAL HIGH (ref 70–99)
Potassium: 4.6 mEq/L (ref 3.5–5.1)
Potassium: 4.1 mmol/L (ref 3.5–5.1)
Sodium: 138 mEq/L (ref 135–145)
Sodium: 139 mmol/L (ref 135–145)
Total Bilirubin: 0.4 mg/dL (ref 0.2–1.2)
Total Bilirubin: 0.7 mg/dL (ref 0.3–1.2)
Total Protein: 6.2 g/dL (ref 6.0–8.3)
Total Protein: 6.6 g/dL (ref 6.5–8.1)

## 2018-02-08 LAB — IRON,TIBC AND FERRITIN PANEL
%SAT: 3 % — AB (ref 20–48)
Ferritin: 26 ng/mL (ref 24–380)
Iron: 11 ug/dL — ABNORMAL LOW (ref 50–180)
TIBC: 346 ug/dL (ref 250–425)

## 2018-02-08 LAB — PROTIME-INR
INR: 1.26
Prothrombin Time: 15.6 seconds — ABNORMAL HIGH (ref 11.4–15.2)

## 2018-02-08 LAB — PREPARE RBC (CROSSMATCH)

## 2018-02-08 LAB — POC OCCULT BLOOD, ED: Fecal Occult Bld: POSITIVE — AB

## 2018-02-08 MED ORDER — LABETALOL HCL 5 MG/ML IV SOLN: 5.0000 mg | INTRAVENOUS | Status: DC | PRN

## 2018-02-08 MED ORDER — DOCUSATE SODIUM 100 MG PO CAPS
100.0000 mg | ORAL_CAPSULE | Freq: Every day | ORAL | Status: DC
  Administered 2018-02-09 – 2018-02-10 (×2): 100 mg via ORAL
  Filled 2018-02-08 (×2): qty 1

## 2018-02-08 MED ORDER — IRON 240 (27 FE) MG PO TABS: 1.0000 | ORAL_TABLET | Freq: Every day | ORAL | Status: DC

## 2018-02-08 MED ORDER — PANTOPRAZOLE SODIUM 40 MG IV SOLR: 40.0000 mg | Freq: Two times a day (BID) | INTRAVENOUS | Status: DC

## 2018-02-08 MED ORDER — PANTOPRAZOLE SODIUM 40 MG PO TBEC: 40.0000 mg | DELAYED_RELEASE_TABLET | Freq: Every day | ORAL | Status: DC

## 2018-02-08 MED ORDER — ONDANSETRON HCL 4 MG/2ML IJ SOLN: 4.0000 mg | Freq: Four times a day (QID) | INTRAMUSCULAR | Status: DC | PRN

## 2018-02-08 MED ORDER — SODIUM CHLORIDE 0.9 % IV SOLN
80.0000 mg | Freq: Once | INTRAVENOUS | Status: AC
  Administered 2018-02-08: 80 mg via INTRAVENOUS
  Filled 2018-02-08: qty 80

## 2018-02-08 MED ORDER — NITROGLYCERIN 0.4 MG SL SUBL: 0.4000 mg | SUBLINGUAL_TABLET | SUBLINGUAL | Status: DC | PRN

## 2018-02-08 MED ORDER — ONDANSETRON HCL 4 MG PO TABS: 4.0000 mg | ORAL_TABLET | Freq: Four times a day (QID) | ORAL | Status: DC | PRN

## 2018-02-08 MED ORDER — METOPROLOL TARTRATE 25 MG PO TABS
25.0000 mg | ORAL_TABLET | Freq: Two times a day (BID) | ORAL | Status: DC
  Administered 2018-02-08 – 2018-02-11 (×5): 25 mg via ORAL
  Filled 2018-02-08 (×5): qty 1

## 2018-02-08 MED ORDER — ACETAMINOPHEN 325 MG PO TABS: 650.0000 mg | ORAL_TABLET | Freq: Four times a day (QID) | ORAL | Status: DC | PRN

## 2018-02-08 MED ORDER — SODIUM CHLORIDE 0.9 % IV SOLN
8.0000 mg/h | INTRAVENOUS | Status: DC
  Administered 2018-02-09 – 2018-02-10 (×2): 8 mg/h via INTRAVENOUS
  Filled 2018-02-08 (×5): qty 80

## 2018-02-08 MED ORDER — SODIUM CHLORIDE 0.9 % IV SOLN
80.0000 mg | Freq: Once | INTRAVENOUS | Status: DC
  Filled 2018-02-08: qty 80

## 2018-02-08 MED ORDER — ACETAMINOPHEN 650 MG RE SUPP: 650.0000 mg | Freq: Four times a day (QID) | RECTAL | Status: DC | PRN

## 2018-02-08 MED ORDER — SODIUM CHLORIDE 0.9 % IV SOLN
10.0000 mL/h | Freq: Once | INTRAVENOUS | Status: AC
  Administered 2018-02-08: 10 mL/h via INTRAVENOUS

## 2018-02-08 MED ORDER — ADULT MULTIVITAMIN W/MINERALS CH
1.0000 | ORAL_TABLET | Freq: Every day | ORAL | Status: DC
  Administered 2018-02-09 – 2018-02-11 (×2): 1 via ORAL
  Filled 2018-02-08 (×2): qty 1

## 2018-02-08 MED ORDER — TAMSULOSIN HCL 0.4 MG PO CAPS
0.4000 mg | ORAL_CAPSULE | Freq: Every evening | ORAL | Status: DC
  Administered 2018-02-08 – 2018-02-10 (×3): 0.4 mg via ORAL
  Filled 2018-02-08 (×3): qty 1

## 2018-02-08 MED ORDER — TOBRAMYCIN 0.3 % OP SOLN
2.0000 [drp] | OPHTHALMIC | Status: DC
  Administered 2018-02-08 – 2018-02-11 (×16): 2 [drp] via OPHTHALMIC
  Filled 2018-02-08: qty 5

## 2018-02-08 MED ORDER — ATORVASTATIN CALCIUM 10 MG PO TABS
20.0000 mg | ORAL_TABLET | Freq: Every day | ORAL | Status: DC
  Administered 2018-02-08 – 2018-02-10 (×3): 20 mg via ORAL
  Filled 2018-02-08 (×3): qty 2

## 2018-02-08 MED ORDER — FERROUS SULFATE 325 (65 FE) MG PO TABS
325.0000 mg | ORAL_TABLET | Freq: Every day | ORAL | Status: DC
  Administered 2018-02-08 – 2018-02-10 (×3): 325 mg via ORAL
  Filled 2018-02-08 (×3): qty 1

## 2018-02-08 NOTE — ED Notes (Signed)
Attending physician bedside for evaluation and planning 

## 2018-02-08 NOTE — H&P (Signed)
History and Physical   Corey Mcdonald NGE:[id number] DOB: 09-07-1923 DOA: 02/08/2018  Referring MD/NP/PA: Dr. Jacqulyn Bath  PCP: Shirline Frees, NP   Outpatient Specialists: Dr. Rennis Golden of cardiology  Patient coming from: Home  Chief Complaint: Weakness and abnormal labs  HPI: Corey Mcdonald is a 84 y.o. male with medical history significant of coronary artery disease, atrial fibrillation on chronic Eliquis, hyperlipidemia, hypertension, peptic ulcer disease who has also had recurrent UTIs and AAA repair.  Patient apparently went to see his PCP yesterday due to weakness and for follow-up.  Work-up was done and his hemoglobin was found to be low.  A review of his hemoglobin since May of this year showed slight and continued decline.  It is now 6.8 in the ER.  Patient has had no obvious melena and no bright red Mcdonald per rectum.  He is guaiac positive in the ER.  Per family he has been on 5 mg of Eliquis for couple of years now.  He has had previous attempted ablation of his atrial fibrillation.  He has had previous GI bleed which was attributed to combination of aspirin and Eliquis.  The aspirin has been discontinued since then.  Patient's family initially thought his aortic aneurysm may be leaking.  Ultrasound however was normal..  ED Course: Temperature is 96.8 Mcdonald pressure 108/73 pulse 123 respirate of 32 oxygen sat 96% on room air.  White count is 6.0 hemoglobin 6.8 and platelets 308.  Sodium 139 potassium 4.0 chloride 103 CO2 23 with a BUN of 44 creatinine 1.98 calcium 9.9 and glucose 102.  Fecal occult Mcdonald test was positive x2.  PT is 15.6 INR 1.26.  2 units of packed red Mcdonald cells have been ordered by the ER, Dr. Glade Nurse of gastroenterology consulted and patient is being admitted to the hospital with presumed GI bleed  Review of Systems: As per HPI otherwise 10 point review of systems negative.    Past Medical History:  Diagnosis Date  . AAA (abdominal aortic aneurysm) (HCC)   . ABDOMINAL AORTIC  ANEURYSM   . Anemia January 2012   admitted with acute MI March 15, 2010 and has significant acute Mcdonald loss anemia requiring transfusions of two units of packed RBCs.  Status post upper endoscopy March 22, 2010 without High-Risk bleeding lesion.  History of peptic ulcer disease  . Atrial fibrillation (HCC)   . BENIGN PROSTATIC HYPERTROPHY, HX OF    . COLONIC POLYPS, HX OF   . CORONARY ARTERY DISEASE    non-ST segment MI, March 15, 2010  . History of Doppler ultrasound 05/2011   h/o claudication, stable ABIs  . History of nuclear stress test 2007   persantine; normal, low risk   . HYPERLIPIDEMIA   . HYPERTENSION   . MACULAR DEGENERATION   . Myocardial infarction (HCC) 02/2010  . PAD (peripheral artery disease) (HCC)   . PEPTIC ULCER DISEASE, HX OF   . PVD (peripheral vascular disease) (HCC)   . RESTLESS LEGS SYNDROME   . SLEEP APNEA   . VERTIGO     Past Surgical History:  Procedure Laterality Date  . ABDOMINAL AORTIC ANEURYSM REPAIR     stenting   . CARDIAC CATHETERIZATION  02/2010   r/t NSTEMI; loss of SVG to RCA  . CATARACT EXTRACTION    . COLONOSCOPY WITH PROPOFOL N/A 02/04/2015   Procedure: COLONOSCOPY WITH PROPOFOL;  Surgeon: Charlott Rakes, MD;  Location[facility] ENDOSCOPY;  Service: Endoscopy;  Laterality: N/A;  . CORONARY ARTERY BYPASS GRAFT  12/1995  x5; LIMA to ALD, vein graft to RCA (now occluded),SVG to OM, SVG to diagonal  . ESOPHAGOGASTRODUODENOSCOPY N/A 01/28/2015   Procedure: ESOPHAGOGASTRODUODENOSCOPY (EGD);  Surgeon: Vida RiggerMarc Magod, MD;  [facility];  Service: Endoscopy;  Laterality: N/A;  . INGUINAL HERNIA REPAIR     Left inguinal  . TRANSTHORACIC ECHOCARDIOGRAM  04/2011   EF 55-60%; mild LVH; grade 1 diastolic dysfunction     reports that he quit smoking about 30 years ago. His smoking use included cigarettes. He has a 40.00 pack-year smoking history. He has never used smokeless tobacco. He reports that he does not drink alcohol or use  drugs.  Allergies  Allergen Reactions  . Cilostazol Other (See Comments)    Adverse reaction per patient - leg cramps  . Doxazosin Other (See Comments)    Leg cramps    Family History  Problem Relation Age of Onset  . Heart disease Mother   . Heart disease Father   . Stroke Father   . Heart disease Sister   . Macular degeneration Sister   . Heart disease Sister   . Stroke Brother   . Hypertension Sister   . Heart Problems Sister        x2     Prior to Admission medications   Medication Sig Start Date End Date Taking? Authorizing Provider  acetaminophen (TYLENOL) 325 MG tablet Take 650 mg by mouth every 6 (six) hours as needed for moderate pain.    Yes [provider]  atorvastatin (LIPITOR) 20 MG tablet Take 1 tablet (20 mg total) by mouth daily. Patient taking differently: Take 20 mg by mouth at bedtime.  10/27/17  Yes Nafziger, Kandee Keenory, NP  docusate sodium (COLACE) 100 MG capsule Take 100 mg by mouth daily before supper.    Yes [provider]  ELIQUIS 5 MG TABS tablet TAKE 1 TABLET BY MOUTH TWICE DAILY Patient taking differently: Take 5 mg by mouth 2 (two) times daily.  08/01/17  Yes Hilty, Lisette AbuKenneth C, MD  Ferrous Gluconate (IRON) 240 (27 Fe) MG TABS Take 1 tablet by mouth at bedtime.    Yes [provider]  furosemide (LASIX) 20 MG tablet Take 40mg  by mouth in the morning and 20mg  in the afternoon. Patient taking differently: Take 20-40 mg by mouth See admin instructions. Take 40 mg by mouth in the morning and 20 mg in the afternoon. 01/05/18  Yes Hilty, Lisette AbuKenneth C, MD  Multiple Vitamin (MULTIVITAMIN WITH MINERALS) TABS tablet Take 1 tablet by mouth daily.   Yes [provider]  nitroGLYCERIN (NITROSTAT) 0.4 MG SL tablet Place 1 tablet (0.4 mg total) under the tongue every 5 (five) minutes as needed. FOR CHEST PAIN Patient taking differently: Place 0.4 mg under the tongue every 5 (five) minutes as needed for chest pain.  01/20/15  Yes Hilty,  Lisette AbuKenneth C, MD  pantoprazole (PROTONIX) 40 MG tablet TAKE 1 TABLET BY MOUTH ONCE DAILY Patient taking differently: Take 40 mg by mouth daily before breakfast.  04/13/17  Yes Hilty, Lisette AbuKenneth C, MD  PRESCRIPTION MEDICATION Inhale into the lungs See admin instructions. CPAP (pressure is set at 10): Use at bedtime and during all naps   Yes [provider]  tamsulosin (FLOMAX) 0.4 MG CAPS capsule TAKE 1 CAPSULE BY MOUTH ONCE DAILY IN THE EVENING. Patient taking differently: Take 0.4 mg by mouth every evening.  02/07/18  Yes Burchette, Elberta FortisBruce W, MD  tobramycin (TOBREX) 0.3 % ophthalmic solution Place 2 drops into both eyes every 4 (  four) hours. 02/07/18  Yes Burchette, Elberta Fortis, MD  metoprolol tartrate (LOPRESSOR) 25 MG tablet TAKE 1 TABLET BY MOUTH TWICE DAILY Patient not taking: No sig reported 10/13/17   Rollene Rotunda, MD  carbidopa-levodopa (SINEMET CR) 25-100 MG per tablet Take by mouth. 1/2 tablet at bedtime   05/10/11  [provider]    Physical Exam: Vitals:   02/08/18 1830 02/08/18 1839 02/08/18 1900 02/08/18 1930  BP: 125/78 125/78 126/71 134/72  Pulse: 99 97 (!) 101 (!) 105  Resp: 15 17 (!) 23 17  Temp:  97.6 F (36.4 C)    TempSrc:  Oral    SpO2: 98% 98% 99% 98%  Weight:      Height:          Constitutional: NAD, calm, comfortable, Frail Vitals:   02/08/18 1830 02/08/18 1839 02/08/18 1900 02/08/18 1930  BP: 125/78 125/78 126/71 134/72  Pulse: 99 97 (!) 101 (!) 105  Resp: 15 17 (!) 23 17  Temp:  97.6 F (36.4 C)    TempSrc:  Oral    SpO2: 98% 98% 99% 98%  Weight:      Height:       Eyes: PERRL, lids and conjunctivae are pale ENMT: Mucous membranes are moist. Posterior pharynx clear of any exudate or lesions.Normal dentition.  Neck: normal, supple, no masses, no thyromegaly Respiratory: clear to auscultation bilaterally, no wheezing, no crackles. Normal respiratory effort. No accessory muscle use.  Cardiovascular: Irregularly irregular rate and rhythm,  no murmurs / rubs / gallops. No extremity edema. 2+ pedal pulses. No carotid bruits.  Abdomen: no tenderness, no masses palpated. No hepatosplenomegaly. Bowel sounds positive.  Musculoskeletal: no clubbing / cyanosis. No joint deformity upper and lower extremities. Good ROM, no contractures. Normal muscle tone.  Skin: no rashes, lesions, ulcers. No induration Neurologic: CN 2-12 grossly intact. Sensation intact, DTR normal. Strength 5/5 in all 4.  Psychiatric: Normal judgment and insight. Alert and oriented x 3. Normal mood.     Labs on Admission: I have personally reviewed following labs and imaging studies  CBC: Recent Labs  Lab 02/07/18 1555 02/08/18 1642  WBC 6.9 6.0  NEUTROABS 5.0 4.4  HGB 6.8 Repeated and verified X2.* 6.8*  HCT 21.9 Repeated and verified X2.* 23.7*  MCV 90.0 95.2  PLT 299.0 308   Basic Metabolic Panel: Recent Labs  Lab 02/07/18 1555 02/08/18 1642  NA 138 139  K 4.6 4.1  CL 102 103  CO2 27 23  GLUCOSE 87 102*  BUN 40* 44*  CREATININE 1.92* 1.98*  CALCIUM 8.8 9.1   GFR: Estimated Creatinine Clearance: 24.2 mL/min (A) (by C-G formula based on SCr of 1.98 mg/dL (H)). Liver Function Tests: Recent Labs  Lab 02/07/18 1555 02/08/18 1642  AST 14 23  ALT 17 21  ALKPHOS 63 65  BILITOT 0.4 0.7  PROT 6.2 6.6  ALBUMIN 3.4* 3.2*   No results for input(s): LIPASE, AMYLASE in the last 168 hours. No results for input(s): AMMONIA in the last 168 hours. Coagulation Profile: Recent Labs  Lab 02/08/18 1642  INR 1.26   Cardiac Enzymes: No results for input(s): CKTOTAL, CKMB, CKMBINDEX, TROPONINI in the last 168 hours. BNP (last 3 results) Recent Labs    07/28/17 0940 10/06/17 1120  PROBNP 1,469* 211.0*   HbA1C: No results for input(s): HGBA1C in the last 72 hours. CBG: No results for input(s): GLUCAP in the last 168 hours. Lipid Profile: No results for input(s): CHOL, HDL, LDLCALC, TRIG, CHOLHDL, LDLDIRECT  in the last 72 hours. Thyroid  Function Tests: No results for input(s): TSH, T4TOTAL, FREET4, T3FREE, THYROIDAB in the last 72 hours. Anemia Panel: Recent Labs    02/07/18 1557  FERRITIN 26  TIBC 346  IRON 11*   Urine analysis:    Component Value Date/Time   COLORURINE YELLOW 12/17/2010 1123   APPEARANCEUR CLEAR 12/17/2010 1123   LABSPEC 1.011 12/17/2010 1123   PHURINE 7.0 12/17/2010 1123   GLUCOSEU NEGATIVE 12/17/2010 1123   HGBUR NEGATIVE 12/17/2010 1123   HGBUR negative 09/03/2008 1110   BILIRUBINUR n 12/18/2017 1618   KETONESUR NEGATIVE 12/17/2010 1123   PROTEINUR Negative 12/18/2017 1618   PROTEINUR NEGATIVE 12/17/2010 1123   UROBILINOGEN 0.2 12/18/2017 1618   UROBILINOGEN 0.2 12/17/2010 1123   NITRITE n 12/18/2017 1618   NITRITE NEGATIVE 12/17/2010 1123   LEUKOCYTESUR Negative 12/18/2017 1618   Sepsis Labs: @LABRCNTIP (procalcitonin:4,lacticidven:4) )No results found for this or any previous visit (from the past 240 hour(s)).   Radiological Exams on Admission: Vas [place] Evar Duplex  Result Date: 02/08/2018 Endovascular Aortic Repair Study (EVAR) Indications: Follow up exam for EVAR. Surgery date 2012.  Performing Technologist: Elita Quick RVT  Examination Guidelines: A complete evaluation includes B-mode imaging, spectral Doppler, color Doppler, and power Doppler as needed of all accessible portions of each vessel. Bilateral testing is considered an integral part of a complete examination. Limited examinations for reoccurring indications may be performed as noted.  Abdominal Aorta Findings: +------------+-------+----------+----------+---------+--------+--------+ Location    AP (cm)Trans (cm)PSV (cm/s)Waveform ThrombusComments +------------+-------+----------+----------+---------+--------+--------+ Distal      6.06   6.03      114       triphasic                 +------------+-------+----------+----------+---------+--------+--------+ RT limb Prox1.2    1.2       114       triphasic                  +------------+-------+----------+----------+---------+--------+--------+ LT limb Prox1.1    1.1       -         -                         +------------+-------+----------+----------+---------+--------+--------+  Summary: IVC/Iliac: Patent stent of the abdominal aorta with a maximum native aortic measurement of 6.06 x 6.03.  *See table(s) above for measurements and observations.  Electronically signed by Fabienne Bruns MD on 02/08/2018 at 1:44:14 PM.   Final     Assessment/Plan Principal Problem:   Symptomatic anemia Active Problems:   Dyslipidemia   Essential hypertension   Atrial fibrillation (HCC)   PEPTIC ULCER DISEASE, HX OF   UGI bleed   CAD in native artery   AAA (abdominal aortic aneurysm) without rupture (HCC)     #1 acute Mcdonald loss anemia: Patient is symptomatic.  Secondary to GI bleed.  Previous history of peptic ulcer disease and on Eliquis.  We will hold his Eliquis.  Agree with transfusion of 2 units packed red Mcdonald cells.  IV Protonix.  GI consultation and continue supportive care.  Patient is on too high a dose of Eliquis for his age.  He should probably be on 2.5 mg being that he is over 63 years old.  Patient will be n.p.o. except for ice chips and then clear liquid diet as tolerated  #2 hypertension: Continue with home regimen.  #3 history of peptic ulcer disease: Continue with the IV Protonix and  hopefully patient may get EGD.  #4 abdominal aortic aneurysm: Ultrasound today showed that everything is intact.  #5 hyperlipidemia: Continue with statin.  #6 coronary artery disease: No coronary artery decompensation however we need his hemoglobin closer to 10 g to avoid demand ischemia.  #7 chronic atrial fibrillation: We will hold the Eliquis.  Rate control with IV labetalol  DVT prophylaxis: SCD Code Status: Full code Family Communication: Son and daughter at bedside with the patient Disposition Plan: To be determined Consults called: Dr. Ewing SchleinMagod  of gastroenterology Admission status: Inpatient  Severity of Illness: The appropriate patient status for this patient is INPATIENT. Inpatient status is judged to be reasonable and necessary in order to provide the required intensity of service to ensure the patient's safety. The patient's presenting symptoms, physical exam findings, and initial radiographic and laboratory data in the context of their chronic comorbidities is felt to place them at high risk for further clinical deterioration. Furthermore, it is not anticipated that the patient will be medically stable for discharge from the hospital within 2 midnights of admission. The following factors support the patient status of inpatient.   " The patient's presenting symptoms include generalized weakness. " The worrisome physical exam findings include weakness with some mild pallor. " The initial radiographic and laboratory data are worrisome because of hemoglobin of 6.8. " The chronic co-morbidities include previous peptic ulcer disease with atrial fibrillation.   * I certify that at the point of admission it is my clinical judgment that the patient will require inpatient hospital care spanning beyond 2 midnights from the point of admission due to high intensity of service, high risk for further deterioration and high frequency of surveillance required.Corey Mcdonald*    GARBA,LAWAL MD [facility] Pager [phone]  If 7PM-7AM, please contact night-coverage [email or web address] Password [email or web address]  02/08/2018, 7:52 PM

## 2018-02-08 NOTE — ED Notes (Signed)
Attempted report to [facility]. Next shift nurse will call back to give report.

## 2018-02-08 NOTE — ED Provider Notes (Signed)
Emergency Department Provider Note   I have reviewed the triage vital signs and the nursing notes.   HISTORY  Chief Complaint Abnormal Lab   HPI Corey Mcdonald is a 84 y.o. male with PMH of AAA s/p repair, A-fib on Eliquis, CAD, HLD, HTN, and PUD presents to the emergency department for evaluation of generalized weakness in the setting of low hemoglobin.  History is primarily given by the patient's son at bedside.  The patient saw a new primary care physician 2 days ago who noticed a steady down-trending hemoglobin for the patient.  Patient does have a remote history of GI bleeding but no clear source of bleeding was found.  Additional lab work was sent from the office and resulted today showing hemoglobin of 6.8.  Patient has been feeling somewhat more fatigued but denies shortness of breath or chest pain.  Patient states "I have no idea" if he has had black or bright red blood in the stools.  No vomiting.  Patient continues to be on Eliquis but the primary care physician noted that the dose may be too high.  Patient went to see his vascular surgery team today and they performed an abdominal ultrasound and showed normal abdominal aortic stent.    Past Medical History:  Diagnosis Date  . AAA (abdominal aortic aneurysm) (HCC)   . ABDOMINAL AORTIC ANEURYSM   . Anemia January 2012   admitted with acute MI March 15, 2010 and has significant acute blood loss anemia requiring transfusions of two units of packed RBCs.  Status post upper endoscopy March 22, 2010 without High-Risk bleeding lesion.  History of peptic ulcer disease  . Atrial fibrillation (HCC)   . BENIGN PROSTATIC HYPERTROPHY, HX OF    . COLONIC POLYPS, HX OF   . CORONARY ARTERY DISEASE    non-ST segment MI, March 15, 2010  . History of Doppler ultrasound 05/2011   h/o claudication, stable ABIs  . History of nuclear stress test 2007   persantine; normal, low risk   . HYPERLIPIDEMIA   . HYPERTENSION   . MACULAR  DEGENERATION   . Myocardial infarction (HCC) 02/2010  . PAD (peripheral artery disease) (HCC)   . PEPTIC ULCER DISEASE, HX OF   . PVD (peripheral vascular disease) (HCC)   . RESTLESS LEGS SYNDROME   . SLEEP APNEA   . VERTIGO     Patient Active Problem List   Diagnosis Date Noted  . Symptomatic anemia 02/08/2018  . Aortic valve stenosis 07/28/2017  . AAA (abdominal aortic aneurysm) without rupture (HCC) 10/21/2016  . Acute on chronic combined systolic and diastolic CHF (congestive heart failure) (HCC) 01/22/2016  . TIA (transient ischemic attack) 01/09/2016  . Acute on chronic renal failure (HCC)   . CAD in native artery   . Chronic atrial fibrillation   . Diastolic heart failure (HCC)   . Acute renal failure (HCC)   . UGI bleed 02/02/2015  . GI bleeding 01/27/2015  . Acute blood loss anemia 01/27/2015  . Chronic kidney disease 01/27/2015  . Pedal edema 01/21/2015  . DOE (dyspnea on exertion) 12/19/2014  . Claudication (HCC) 09/28/2012  . S/P CABG x 4 09/28/2012  . Dyslipidemia 12/04/2009  . RESTLESS LEGS SYNDROME 12/04/2009  . History of endovascular stent graft for abdominal aortic aneurysm 09/15/2008  . VERTIGO 07/28/2008  . Atrial fibrillation (HCC) 10/10/2006  . MACULAR DEGENERATION 08/18/2006  . Essential hypertension 08/18/2006  . Coronary atherosclerosis 08/18/2006  . ATRIAL FLUTTER 08/18/2006  . OSA on CPAP  08/18/2006  . PEPTIC ULCER DISEASE, HX OF 08/18/2006  . COLONIC POLYPS, HX OF 08/18/2006  . BPH (benign prostatic hyperplasia) 08/18/2006    Past Surgical History:  Procedure Laterality Date  . ABDOMINAL AORTIC ANEURYSM REPAIR     stenting   . CARDIAC CATHETERIZATION  02/2010   r/t NSTEMI; loss of SVG to RCA  . CATARACT EXTRACTION    . COLONOSCOPY WITH PROPOFOL N/A 02/04/2015   Procedure: COLONOSCOPY WITH PROPOFOL;  Surgeon: Charlott RakesVincent Schooler, MD;  [facility];  Service: Endoscopy;  Laterality: N/A;  . CORONARY ARTERY BYPASS GRAFT  12/1995    x5; LIMA to ALD, vein graft to RCA (now occluded),SVG to OM, SVG to diagonal  . ESOPHAGOGASTRODUODENOSCOPY N/A 01/28/2015   Procedure: ESOPHAGOGASTRODUODENOSCOPY (EGD);  Surgeon: Vida RiggerMarc Magod, MD;  [facility];  Service: Endoscopy;  Laterality: N/A;  . INGUINAL HERNIA REPAIR     Left inguinal  . TRANSTHORACIC ECHOCARDIOGRAM  04/2011   EF 55-60%; mild LVH; grade 1 diastolic dysfunction    Allergies Cilostazol and Doxazosin  Family History  Problem Relation Age of Onset  . Heart disease Mother   . Heart disease Father   . Stroke Father   . Heart disease Sister   . Macular degeneration Sister   . Heart disease Sister   . Stroke Brother   . Hypertension Sister   . Heart Problems Sister        x2    Social History Social History   Tobacco Use  . Smoking status: Former Smoker    Packs/day: 1.00    Years: 40.00    Pack years: 40.00    Types: Cigarettes    Last attempt to quit: 02/22/1987    Years since quitting: 30.9  . Smokeless tobacco: Never Used  Substance Use Topics  . Alcohol use: No  . Drug use: No    Review of Systems  Constitutional: No fever/chills. Positive weakness.  Eyes: No visual changes. ENT: No sore throat. Cardiovascular: Denies chest pain. Respiratory: Denies shortness of breath. Gastrointestinal: No abdominal pain.  No nausea, no vomiting.  No diarrhea.  No constipation. Genitourinary: Negative for dysuria. Musculoskeletal: Negative for back pain. Skin: Negative for rash. Neurological: Negative for headaches, focal weakness or numbness.  10-point ROS otherwise negative.  ____________________________________________   PHYSICAL EXAM:  VITAL SIGNS: ED Triage Vitals  Enc Vitals Group     BP 02/08/18 1613 134/64     Pulse Rate 02/08/18 1613 95     Resp 02/08/18 1613 16     Temp 02/08/18 1613 (!) 97.5 F (36.4 C)     Temp Source 02/08/18 1613 Oral     SpO2 02/08/18 1613 97 %     Weight 02/08/18 1611 208 lb (94.3 kg)     Height  02/08/18 1611 5\' 3"  (1.6 m)     Pain Score 02/08/18 1611 0   Constitutional: Alert and oriented. Well appearing and in no acute distress. Eyes: Conjunctivae are injected.  Head: Atraumatic. Nose: No congestion/rhinnorhea. Mouth/Throat: Mucous membranes are slightly dry.  Neck: No stridor.   Cardiovascular: Normal rate, regular rhythm. Good peripheral circulation. Grossly normal heart sounds.   Respiratory: Normal respiratory effort.  No retractions. Lungs CTAB. Gastrointestinal: Soft and nontender. No distention. Rectal exam with no gross blood or melena.  Musculoskeletal: No lower extremity tenderness with 2+ pitting edema bilaterally. No gross deformities of extremities. Neurologic:  Normal speech and language. No gross focal neurologic deficits are appreciated.  Skin:  Skin is warm,  dry and intact. No rash noted.  ____________________________________________   LABS (all labs ordered are listed, but only abnormal results are displayed)  Labs Reviewed  COMPREHENSIVE METABOLIC PANEL - Abnormal; Notable for the following components:      Result Value   Glucose, Bld 102 (*)    BUN 44 (*)    Creatinine, Ser 1.98 (*)    Albumin 3.2 (*)    GFR calc non Af Amer 28 (*)    GFR calc Af Amer 33 (*)    All other components within normal limits  CBC WITH DIFFERENTIAL/PLATELET - Abnormal; Notable for the following components:   RBC 2.49 (*)    Hemoglobin 6.8 (*)    HCT 23.7 (*)    MCHC 28.7 (*)    RDW 16.0 (*)    All other components within normal limits  PROTIME-INR - Abnormal; Notable for the following components:   Prothrombin Time 15.6 (*)    All other components within normal limits  POC OCCULT BLOOD, ED - Abnormal; Notable for the following components:   Fecal Occult Bld POSITIVE (*)    All other components within normal limits  URINALYSIS, ROUTINE W REFLEX MICROSCOPIC  COMPREHENSIVE METABOLIC PANEL  CBC  CBC  TYPE AND SCREEN  PREPARE RBC (CROSSMATCH)    ____________________________________________  EKG   EKG Interpretation  Date/Time:  Thursday February 08 2018 16:10:37 EST Ventricular Rate:  112 PR Interval:    QRS Duration: 104 QT Interval:  358 QTC Calculation: 489 R Axis:   60 Text Interpretation:  Atrial fibrillation Minimal ST depression, inferior leads Borderline prolonged QT interval No STEMI.  Confirmed by Alona Bene [phone]) on 02/08/2018 4:14:21 PM       ____________________________________________  RADIOLOGY  Timmothy Euler [place] Evar Duplex  Result Date: 02/08/2018 Endovascular Aortic Repair Study (EVAR) Indications: Follow up exam for EVAR. Surgery date 2012.  Performing Technologist: Elita Quick RVT  Examination Guidelines: A complete evaluation includes B-mode imaging, spectral Doppler, color Doppler, and power Doppler as needed of all accessible portions of each vessel. Bilateral testing is considered an integral part of a complete examination. Limited examinations for reoccurring indications may be performed as noted.  Abdominal Aorta Findings: +------------+-------+----------+----------+---------+--------+--------+ Location    AP (cm)Trans (cm)PSV (cm/s)Waveform ThrombusComments +------------+-------+----------+----------+---------+--------+--------+ Distal      6.06   6.03      114       triphasic                 +------------+-------+----------+----------+---------+--------+--------+ RT limb Prox1.2    1.2       114       triphasic                 +------------+-------+----------+----------+---------+--------+--------+ LT limb Prox1.1    1.1       -         -                         +------------+-------+----------+----------+---------+--------+--------+  Summary: IVC/Iliac: Patent stent of the abdominal aorta with a maximum native aortic measurement of 6.06 x 6.03.  *See table(s) above for measurements and observations.  Electronically signed by Fabienne Bruns MD on 02/08/2018 at 1:44:14 PM.    Final     ____________________________________________   PROCEDURES  Procedure(s) performed:   Procedures  CRITICAL CARE Performed by: Maia Plan Total critical care time: 35 minutes Critical care time was exclusive of separately billable procedures and treating other patients. Critical care was  necessary to treat or prevent imminent or life-threatening deterioration. Critical care was time spent personally by me on the following activities: development of treatment plan with patient and/or surrogate as well as nursing, discussions with consultants, evaluation of patient's response to treatment, examination of patient, obtaining history from patient or surrogate, ordering and performing treatments and interventions, ordering and review of laboratory studies, ordering and review of radiographic studies, pulse oximetry and re-evaluation of patient's condition.  Alona Bene, MD Emergency Medicine  ____________________________________________   INITIAL IMPRESSION / ASSESSMENT AND PLAN / ED COURSE  Pertinent labs & imaging results that were available during my care of the patient were reviewed by me and considered in my medical decision making (see chart for details).  Patient presents to the emergency department with symptomatic anemia.  Patient is on Eliquis.  No gross blood or melena on exam but Hemoccult is pending.  Some concern from PCP that his Eliquis dose may be too high.  Abdominal ultrasound reviewed from this morning which was unchanged from prior.   Labs reviewed showing Hb of 6.8. FOBT positive. Spoke with Dr. Ewing Schlein who will follow in the AM with consultation. No leukocytosis to suspect infection or developing sepsis. No SOB or abdominal pain. No imaging at this time.   Discussed patient's case with Hospitalist to request admission. Patient and family (if present) updated with plan. Care transferred to Hospitalist service.  I reviewed all nursing notes, vitals,  pertinent old records, EKGs, labs, imaging (as available).  ____________________________________________  FINAL CLINICAL IMPRESSION(S) / ED DIAGNOSES  Final diagnoses:  Symptomatic anemia     MEDICATIONS GIVEN DURING THIS VISIT:  Medications  docusate sodium (COLACE) capsule 100 mg (has no administration in time range)  nitroGLYCERIN (NITROSTAT) SL tablet 0.4 mg (has no administration in time range)  multivitamin with minerals tablet 1 tablet (has no administration in time range)  metoprolol tartrate (LOPRESSOR) tablet 25 mg (25 mg Oral Given 02/08/18 2230)  atorvastatin (LIPITOR) tablet 20 mg (20 mg Oral Given 02/08/18 2230)  tobramycin (TOBREX) 0.3 % ophthalmic solution 2 drop (2 drops Both Eyes Given 02/08/18 2230)  tamsulosin (FLOMAX) capsule 0.4 mg (0.4 mg Oral Given 02/08/18 2230)  acetaminophen (TYLENOL) tablet 650 mg (has no administration in time range)    Or  acetaminophen (TYLENOL) suppository 650 mg (has no administration in time range)  ondansetron (ZOFRAN) tablet 4 mg (has no administration in time range)    Or  ondansetron (ZOFRAN) injection 4 mg (has no administration in time range)  pantoprazole (PROTONIX) 80 mg in sodium chloride 0.9 % 100 mL IVPB (80 mg Intravenous Not Given 02/08/18 2222)  pantoprazole (PROTONIX) 80 mg in sodium chloride 0.9 % 250 mL (0.32 mg/mL) infusion (8 mg/hr Intravenous Not Given 02/08/18 2205)  pantoprazole (PROTONIX) injection 40 mg (has no administration in time range)  labetalol (NORMODYNE,TRANDATE) injection 5 mg (has no administration in time range)  ferrous sulfate tablet 325 mg (325 mg Oral Given 02/08/18 2230)  0.9 %  sodium chloride infusion (10 mL/hr Intravenous New Bag/Given 02/08/18 1829)  pantoprazole (PROTONIX) 80 mg in sodium chloride 0.9 % 100 mL IVPB (80 mg Intravenous New Bag/Given 02/08/18 2218)    Note:  This document was prepared using Dragon voice recognition software and may include unintentional dictation  errors.  Alona Bene, MD Emergency Medicine    Jaymere Alen, Arlyss Repress, MD 02/08/18 [phone]

## 2018-02-08 NOTE — ED Notes (Signed)
Consent to transfuse blood obtained from pt's daughter and POA.

## 2018-02-08 NOTE — ED Notes (Signed)
Pt denies recent black or bloody stools.

## 2018-02-08 NOTE — Telephone Encounter (Signed)
Patient has arrived at the ED.

## 2018-02-08 NOTE — ED Notes (Addendum)
POC occult blood- Grossly Positive

## 2018-02-08 NOTE — Progress Notes (Signed)
[facility] [place]  CC: Follow up s/p Endovascular Repair of Abdominal Aortic Aneurysm    History of Present Illness  Corey Mcdonald is a 84 y.o. (1923-07-06) male who is status post EVAR in 2012 by Dr. Arbie Cookey.  His CT scan in 2015 showed stable positioning of the stent graft with no evidence of endoleak. He denies any abdominal or back pain. He lives at an independent living center and is doing well. He stays very active. He does complain of continued worsening vision.  In November 2017 he was hospitalized for left sided weakness. Work-up was negative for stroke. His left sided weakness quickly resolved. He was hospitalized for GI bleed in December 2016. The etiology of this was never found. He is on Eliquis for atrial fibrillation. He was evaluated on 02-09-16 by K. Trinh PA-C. At that time patient's native aortic sac has been stable since his last CT scan in 2015. He overall was doing well. He was to follow-up again in 2 years with an EVAR duplex.  Pt lives in Independent Living situation. Son is with pt.   Son states that pt developed Bell's Palsey many years ago, right side of his mouth droops from this.  Diabetic: No Tobaccos use: former smoker, quit in 1989, smoked x 40 years   Past Medical History:  Diagnosis Date  . AAA (abdominal aortic aneurysm) (HCC)   . ABDOMINAL AORTIC ANEURYSM   . Anemia January 2012   admitted with acute MI March 15, 2010 and has significant acute blood loss anemia requiring transfusions of two units of packed RBCs.  Status post upper endoscopy March 22, 2010 without High-Risk bleeding lesion.  History of peptic ulcer disease  . Atrial fibrillation (HCC)   . BENIGN PROSTATIC HYPERTROPHY, HX OF    . COLONIC POLYPS, HX OF   . CORONARY ARTERY DISEASE    non-ST segment MI, March 15, 2010  . History of Doppler ultrasound 05/2011   h/o claudication, stable ABIs  . History of nuclear stress test 2007   persantine; normal, low risk    . HYPERLIPIDEMIA   . HYPERTENSION   . MACULAR DEGENERATION   . Myocardial infarction (HCC) 02/2010  . PAD (peripheral artery disease) (HCC)   . PEPTIC ULCER DISEASE, HX OF   . PVD (peripheral vascular disease) (HCC)   . RESTLESS LEGS SYNDROME   . SLEEP APNEA   . VERTIGO    Past Surgical History:  Procedure Laterality Date  . ABDOMINAL AORTIC ANEURYSM REPAIR     stenting   . CARDIAC CATHETERIZATION  02/2010   r/t NSTEMI; loss of SVG to RCA  . CATARACT EXTRACTION    . COLONOSCOPY WITH PROPOFOL N/A 02/04/2015   Procedure: COLONOSCOPY WITH PROPOFOL;  Surgeon: Charlott Rakes, MD;  [facility];  Service: Endoscopy;  Laterality: N/A;  . CORONARY ARTERY BYPASS GRAFT  12/1995   x5; LIMA to ALD, vein graft to RCA (now occluded),SVG to OM, SVG to diagonal  . ESOPHAGOGASTRODUODENOSCOPY N/A 01/28/2015   Procedure: ESOPHAGOGASTRODUODENOSCOPY (EGD);  Surgeon: Vida Rigger, MD;  Location: [facility] ENDOSCOPY;  Service: Endoscopy;  Laterality: N/A;  . INGUINAL HERNIA REPAIR     Left inguinal  . TRANSTHORACIC ECHOCARDIOGRAM  04/2011   EF 55-60%; mild LVH; grade 1 diastolic dysfunction   Social History Social History   Tobacco Use  . Smoking status: Former Smoker    Packs/day: 1.00    Years: 40.00    Pack years: 40.00    Types: Cigarettes  Last attempt to quit: 02/22/1987    Years since quitting: 30.9  . Smokeless tobacco: Never Used  Substance Use Topics  . Alcohol use: No  . Drug use: No   Family History Family History  Problem Relation Age of Onset  . Heart disease Mother   . Heart disease Father   . Stroke Father   . Heart disease Sister   . Macular degeneration Sister   . Heart disease Sister   . Stroke Brother   . Hypertension Sister   . Heart Problems Sister        x2   Current Outpatient Medications on File Prior to Visit  Medication Sig Dispense Refill  . acetaminophen (TYLENOL) 325 MG tablet Take 650 mg by mouth every 6 (six) hours as needed for moderate pain.      Marland Kitchen. atorvastatin (LIPITOR) 20 MG tablet Take 1 tablet (20 mg total) by mouth daily. 90 tablet 3  . docusate sodium (COLACE) 100 MG capsule Take 100 mg by mouth daily before supper.     Marland Kitchen. ELIQUIS 5 MG TABS tablet TAKE 1 TABLET BY MOUTH TWICE DAILY 180 tablet 3  . Ferrous Gluconate (IRON) 240 (27 Fe) MG TABS Take 1 tablet by mouth daily.    . furosemide (LASIX) 20 MG tablet Take 40mg  by mouth in the morning and 20mg  in the afternoon. 270 tablet 3  . metoprolol tartrate (LOPRESSOR) 25 MG tablet TAKE 1 TABLET BY MOUTH TWICE DAILY 180 tablet 2  . Multiple Vitamin (MULTIVITAMIN WITH MINERALS) TABS tablet Take 1 tablet by mouth daily.    . nitroGLYCERIN (NITROSTAT) 0.4 MG SL tablet Place 1 tablet (0.4 mg total) under the tongue every 5 (five) minutes as needed. FOR CHEST PAIN (Patient taking differently: Place 0.4 mg under the tongue every 5 (five) minutes as needed for chest pain. ) 25 tablet 6  . pantoprazole (PROTONIX) 40 MG tablet TAKE 1 TABLET BY MOUTH ONCE DAILY 60 tablet 4  . PRESCRIPTION MEDICATION Inhale into the lungs at bedtime. CPAP - pressure is 10    . tamsulosin (FLOMAX) 0.4 MG CAPS capsule TAKE 1 CAPSULE BY MOUTH ONCE DAILY IN THE EVENING. 90 capsule 3  . tobramycin (TOBREX) 0.3 % ophthalmic solution Place 2 drops into both eyes every 4 (four) hours. 5 mL 0  . [DISCONTINUED] carbidopa-levodopa (SINEMET CR) 25-100 MG per tablet Take by mouth. 1/2 tablet at bedtime      No current facility-administered medications on file prior to visit.    Allergies  Allergen Reactions  . Cilostazol Other (See Comments)    Adverse reaction per patient - leg cramps  . Doxazosin Other (See Comments)    Leg cramps     ROS: See HPI for pertinent positives and negatives.  Physical Examination  Vitals:   02/08/18 0852  BP: 120/69  Pulse: (!) 107  Resp: 14  Temp: (!) 96.8 F (36 C)  SpO2: 100%  Weight: 208 lb (94.3 kg)  Height: 5\' 3"  (1.6 m)   Body mass index is 36.85 kg/m.  General: A&O x  2, WD, obese elderly male HEENT: bilateral ectropion  Pulmonary: Sym exp, respirations are non labored, good air movement in all fields CTAB, no rales, rhonchi, or wheezes  Cardiac: Irregular rhythm, tachycardic at 107, no murmur appreciated  Vascular: Vessel Right Left  Radial 2+Palpable 2+Palpable  Carotid  without bruit  without bruit  Aorta Not palpable N/A  Femoral Not Palpable, seated, obese Not Palpable  Popliteal Not palpable Not  palpable  PT notPalpable Not Palpable  DP Not Palpable Not Palpable   Gastrointestinal: soft, NTND, -G/R, - HSM, - palpable masses, - CVAT B. Musculoskeletal: M/S 5/5 throughout, extremities without ischemic changes. 2+ dependent pitting edema in bilateral dorsal aspects of feet. Moderate stasis dermatitis with no ulcers in bilateral lower legs, anterior aspects.  Skin: No rashes, no ulcers, see M/S.  Neurologic: Pain and light touch intact in extremities, Motor exam as listed above. CN 2-12 grossly intact except right side of mouth droops with smile.  Psychiatric: Normal thought content, mood appropriate for clinical situation.     DATA  EVAR Duplex   Previous (Date: 02-09-16) AAA sac size: 6 cm  Current (Date: 02-08-18)  AAA sac size: 6.06 cm; Right CIA: 1.2 cm; Left CIA: 1.1 cm  no endoleak detected   Previously on CTA (2015) 5.8 x 6.5 cm  Medical Decision Making  Agustina CaroliCarl Schneiderman is a 84 y.o. male who presents s/p EVAR (Date: 2012).  Pt is asymptomatic with stable sac size at 6.0 cm. Previous size on CTA in 2015 was 6.5 cm.   I discussed with the patient the importance of surveillance of the endograft.  The next endograft duplex will be scheduled for 24 months.  The patient will follow up with us in 24 months with these studies.  Dependent edema - I advised pt son to remind his father to elevate his legs, see Patient Instructions.   I emphasized the importance of maximal medical management including strict control of blood  pressure, blood glucose, and lipid levels, antiplatelet agents, obtaining regular exercise, and cessation of smoking.   Thank you for allowing us to participate in this patient's care.  Charisse MarchSuzanne Nickel, RN, MSN, FNP-C [facility] [place] Office: [phone]  Clinic Physician: Darrick PennaFields  02/08/2018, 9:00 AM

## 2018-02-08 NOTE — ED Triage Notes (Signed)
Pt arrives to ED with his son after being called by his PCP that the pt's blood work resulted with low hemoglobin. Pt also saw vascular surgery today for follow-up on a stent placed in an abdominal aneurysm, son states the stent was placed 2 years ago and has been told there have been no changes and to recheck again in 2 years. Pt pale and weak upon arrival, assisted by 3 staff members into stretcher. Pt alert and oriented x2 (not oriented to time). Pt placed in position of comfort with call bell in reach, bed locked and lowered.

## 2018-02-08 NOTE — Telephone Encounter (Signed)
CRITICAL VALUE STICKER  CRITICAL VALUE: 6.8 hemoglobin  RECEIVER (on-site recipient of call): Corey Mcdonald  DATE & TIME NOTIFIED: 02/08/18  2:40 pm  MESSENGER (representative from lab): Orpha BurKaty  MD NOTIFIED:  Dr Ardyth HarpsHernandez  TIME OF NOTIFICATION: 2:42 pm  RESPONSE: Left message on machine for patient to go to the ED.  Unable to reach the patient.  Notified daughter.  Daughter verabally agreed to take the patient to the ED.

## 2018-02-08 NOTE — Patient Instructions (Addendum)
Before your next abdominal ultrasound:  Avoid gas forming foods and beverages the day before the test.   Take two Extra-Strength Gas-X capsules at bedtime the night before the test. Take another two Extra-Strength Gas-X capsules in the middle of the night if you get up to the restroom, if not, first thing in the morning with water.  Do not chew gum.     To decrease swelling in your feet and legs: Elevate feet above slightly bent knees, feet above heart, overnight and 3-4 times per day for 20 minutes.   

## 2018-02-08 NOTE — Telephone Encounter (Signed)
Hb 6.8 noted. Advised patient to proceed to ED as he will need a transfusion and possibly further work up.

## 2018-02-09 ENCOUNTER — Ambulatory Visit: Payer: Medicare Other | Admitting: Internal Medicine

## 2018-02-09 DIAGNOSIS — K922 Gastrointestinal hemorrhage, unspecified: Secondary | ICD-10-CM

## 2018-02-09 DIAGNOSIS — I10 Essential (primary) hypertension: Secondary | ICD-10-CM

## 2018-02-09 DIAGNOSIS — I2510 Atherosclerotic heart disease of native coronary artery without angina pectoris: Secondary | ICD-10-CM

## 2018-02-09 LAB — CBC
HCT: 25.6 % — ABNORMAL LOW (ref 39.0–52.0)
HCT: 27.9 % — ABNORMAL LOW (ref 39.0–52.0)
Hemoglobin: 8 g/dL — ABNORMAL LOW (ref 13.0–17.0)
Hemoglobin: 8.5 g/dL — ABNORMAL LOW (ref 13.0–17.0)
MCH: 28 pg (ref 26.0–34.0)
MCH: 27.3 pg (ref 26.0–34.0)
MCHC: 31.3 g/dL (ref 30.0–36.0)
MCHC: 30.5 g/dL (ref 30.0–36.0)
MCV: 89.5 fL (ref 80.0–100.0)
MCV: 89.7 fL (ref 80.0–100.0)
PLATELETS: 288 10*3/uL (ref 150–400)
Platelets: 309 10*3/uL (ref 150–400)
RBC: 2.86 MIL/uL — ABNORMAL LOW (ref 4.22–5.81)
RBC: 3.11 MIL/uL — ABNORMAL LOW (ref 4.22–5.81)
RDW: 16.3 % — ABNORMAL HIGH (ref 11.5–15.5)
RDW: 16.7 % — ABNORMAL HIGH (ref 11.5–15.5)
WBC: 5.6 10*3/uL (ref 4.0–10.5)
WBC: 5.3 10*3/uL (ref 4.0–10.5)
nRBC: 0 % (ref 0.0–0.2)
nRBC: 0 % (ref 0.0–0.2)

## 2018-02-09 LAB — COMPREHENSIVE METABOLIC PANEL
ALT: 18 U/L (ref 0–44)
AST: 17 U/L (ref 15–41)
Albumin: 2.8 g/dL — ABNORMAL LOW (ref 3.5–5.0)
Alkaline Phosphatase: 54 U/L (ref 38–126)
Anion gap: 11 (ref 5–15)
BUN: 40 mg/dL — ABNORMAL HIGH (ref 8–23)
CO2: 22 mmol/L (ref 22–32)
Calcium: 8.6 mg/dL — ABNORMAL LOW (ref 8.9–10.3)
Chloride: 105 mmol/L (ref 98–111)
Creatinine, Ser: 1.66 mg/dL — ABNORMAL HIGH (ref 0.61–1.24)
GFR calc non Af Amer: 35 mL/min — ABNORMAL LOW (ref >60)
GFR, EST AFRICAN AMERICAN: 41 mL/min — AB (ref >60)
Glucose, Bld: 114 mg/dL — ABNORMAL HIGH (ref 70–99)
Potassium: 3.9 mmol/L (ref 3.5–5.1)
Sodium: 138 mmol/L (ref 135–145)
Total Bilirubin: 2 mg/dL — ABNORMAL HIGH (ref 0.3–1.2)
Total Protein: 5.9 g/dL — ABNORMAL LOW (ref 6.5–8.1)

## 2018-02-09 LAB — TYPE AND SCREEN
ABO/RH(D): A POS
Antibody Screen: NEGATIVE
Unit division: 0
Unit division: 0

## 2018-02-09 LAB — URINALYSIS, ROUTINE W REFLEX MICROSCOPIC
Bilirubin Urine: NEGATIVE
Glucose, UA: NEGATIVE mg/dL
Hgb urine dipstick: NEGATIVE
Ketones, ur: NEGATIVE mg/dL
Leukocytes, UA: NEGATIVE
Nitrite: NEGATIVE
Protein, ur: NEGATIVE mg/dL
Specific Gravity, Urine: 1.014 (ref 1.005–1.030)
pH: 6 (ref 5.0–8.0)

## 2018-02-09 LAB — BPAM RBC
Blood Product Expiration Date: 201912282359
Blood Product Expiration Date: 201912282359
ISSUE DATE / TIME: 201912191807
ISSUE DATE / TIME: 201912192143
UNIT TYPE AND RH: 6200
Unit Type and Rh: 6200

## 2018-02-09 NOTE — Progress Notes (Addendum)
PROGRESS NOTE        PATIENT DETAILS Name: Corey CaroliCarl Knock Age: 84 y.o. Sex: male Date of Birth: 07-18-1923 Admit Date: 02/08/2018 Admitting Physician Rometta EmeryMohammad L Garba, MD ZOX:[id number]:Nafziger, Kandee Keenory, NP  Brief Narrative: Patient is a 84 y.o. male prior history of GI bleeding in 2016 (underwent EGD/colonoscopy-showed hiatal hernia and sigmoid diverticulosis / hemorrhoids) A. fib on Eliquis-sent by PCP for evaluation of symptomatic anemia, found to have a hemoglobin of 6.9 with FOBT positive stools without any overt GI bleeding.  Admitted and given 2 units of PRBC.  GI consultation in progress.  See below for further details  Subjective: He is legally blind-hard of hearing-somewhat confused but can be easily reoriented.  Denies any chest pain or shortness of breath.   Assessment/Plan: Anemia: Continue to?  Recent blood loss from slow GI bleed.  Has been transfused 2 units of PRBC-hemoglobin currently stable.  Follow CBC.  Possible slow/occult GI bleeding: Likely causing above-evaluated by GI-EGD scheduled for tomorrow.  No overt bleeding noted overnight.  Continue PPI.  Chronic atrial fibrillation: Rate controlled with metoprolol-Eliquis currently on hold.  CAD status post CABG: No anginal symptoms currently.  No longer on antiplatelets-is maintained on anticoagulation given prior history of GI bleeding  Chronic diastolic heart failure: Euvolemic on exam-follow closely  CKD stage IV: Creatinine close to usual baseline.  Follow.  Dyslipidemia: Continue statin.  BPH: Continue Flomax.  DVT Prophylaxis: SCD's  Code Status: Full code   Family Communication: None at bedside  Disposition Plan: Remain inpatient  Antimicrobial agents: Anti-infectives (From admission, onward)   None      Procedures: None  CONSULTS:  GI  Time spent: 25- minutes-Greater than 50% of this time was spent in counseling, explanation of diagnosis, planning of further management,  and coordination of care.  MEDICATIONS: Scheduled Meds: . atorvastatin  20 mg Oral QHS  . docusate sodium  100 mg Oral QAC supper  . ferrous sulfate  325 mg Oral QHS  . metoprolol tartrate  25 mg Oral BID  . multivitamin with minerals  1 tablet Oral Daily  . [START ON 02/12/2018] pantoprazole  40 mg Intravenous Q12H  . tamsulosin  0.4 mg Oral QPM  . tobramycin  2 drop Both Eyes Q4H   Continuous Infusions: . pantoprazole (PROTONIX) IVPB    . pantoprozole (PROTONIX) infusion     PRN Meds:.acetaminophen **OR** acetaminophen, labetalol, nitroGLYCERIN, ondansetron **OR** ondansetron (ZOFRAN) IV   PHYSICAL EXAM: Vital signs: Vitals:   02/08/18 2220 02/09/18 0053 02/09/18 0446 02/09/18 0830  BP: 114/67 101/70 117/63 126/61  Pulse: 78 88 89 80  Resp:  18 20   Temp: 97.8 F (36.6 C) (!) 97.5 F (36.4 C) (!) 97.5 F (36.4 C)   TempSrc: Oral Oral Oral   SpO2: 95% 93% 91%   Weight:      Height:       Filed Weights   02/08/18 1611  Weight: 94.3 kg   Body mass index is 36.85 kg/m.   General appearance :Awake, alert, not in any distress.  Eyes:Pink conjunctiva HEENT: Atraumatic and Normocephalic Neck: supple Resp:Good air entry bilaterally, no added sounds  CVS: S1 S2 irregular GI: Bowel sounds present, Non tender and not distended with no gaurding, rigidity or rebound.No organomegaly Extremities: B/L Lower Ext shows no edema, both legs are warm to touch Neurology: Nonfocal active moving all 4  extremities Musculoskeletal:No digital cyanosis Skin:No Rash, warm and dry Wounds:N/A  I have personally reviewed following labs and imaging studies  LABORATORY DATA: CBC: Recent Labs  Lab 02/07/18 1555 02/08/18 1642 02/09/18 0442 02/09/18 0752  WBC 6.9 6.0 5.6 5.3  NEUTROABS 5.0 4.4  --   --   HGB 6.8 Repeated and verified X2.* 6.8* 8.0* 8.5*  HCT 21.9 Repeated and verified X2.* 23.7* 25.6* 27.9*  MCV 90.0 95.2 89.5 89.7  PLT 299.0 308 288 309    Basic Metabolic  Panel: Recent Labs  Lab 02/07/18 1555 02/08/18 1642 02/09/18 0442  NA 138 139 138  K 4.6 4.1 3.9  CL 102 103 105  CO2 27 23 22   GLUCOSE 87 102* 114*  BUN 40* 44* 40*  CREATININE 1.92* 1.98* 1.66*  CALCIUM 8.8 9.1 8.6*    GFR: Estimated Creatinine Clearance: 28.9 mL/min (A) (by C-G formula based on SCr of 1.66 mg/dL (H)).  Liver Function Tests: Recent Labs  Lab 02/07/18 1555 02/08/18 1642 02/09/18 0442  AST 14 23 17   ALT 17 21 18   ALKPHOS 63 65 54  BILITOT 0.4 0.7 2.0*  PROT 6.2 6.6 5.9*  ALBUMIN 3.4* 3.2* 2.8*   No results for input(s): LIPASE, AMYLASE in the last 168 hours. No results for input(s): AMMONIA in the last 168 hours.  Coagulation Profile: Recent Labs  Lab 02/08/18 1642  INR 1.26    Cardiac Enzymes: No results for input(s): CKTOTAL, CKMB, CKMBINDEX, TROPONINI in the last 168 hours.  BNP (last 3 results) Recent Labs    07/28/17 0940 10/06/17 1120  PROBNP 1,469* 211.0*    HbA1C: No results for input(s): HGBA1C in the last 72 hours.  CBG: No results for input(s): GLUCAP in the last 168 hours.  Lipid Profile: No results for input(s): CHOL, HDL, LDLCALC, TRIG, CHOLHDL, LDLDIRECT in the last 72 hours.  Thyroid Function Tests: No results for input(s): TSH, T4TOTAL, FREET4, T3FREE, THYROIDAB in the last 72 hours.  Anemia Panel: Recent Labs    02/07/18 1557  FERRITIN 26  TIBC 346  IRON 11*    Urine analysis:    Component Value Date/Time   COLORURINE YELLOW 12/17/2010 1123   APPEARANCEUR CLEAR 12/17/2010 1123   LABSPEC 1.011 12/17/2010 1123   PHURINE 7.0 12/17/2010 1123   GLUCOSEU NEGATIVE 12/17/2010 1123   HGBUR NEGATIVE 12/17/2010 1123   HGBUR negative 09/03/2008 1110   BILIRUBINUR n 12/18/2017 1618   KETONESUR NEGATIVE 12/17/2010 1123   PROTEINUR Negative 12/18/2017 1618   PROTEINUR NEGATIVE 12/17/2010 1123   UROBILINOGEN 0.2 12/18/2017 1618   UROBILINOGEN 0.2 12/17/2010 1123   NITRITE n 12/18/2017 1618   NITRITE NEGATIVE  12/17/2010 1123   LEUKOCYTESUR Negative 12/18/2017 1618    Sepsis Labs: Lactic Acid, Venous    Component Value Date/Time   LATICACIDVEN 1.4 02/03/2015 1457    MICROBIOLOGY: No results found for this or any previous visit (from the past 240 hour(s)).  RADIOLOGY STUDIES/RESULTS: Vas Koreas Evar Duplex  Result Date: 02/08/2018 Endovascular Aortic Repair Study (EVAR) Indications: Follow up exam for EVAR. Surgery date 2012.  Performing Technologist: Elita QuickSusan Skeel RVT  Examination Guidelines: A complete evaluation includes B-mode imaging, spectral Doppler, color Doppler, and power Doppler as needed of all accessible portions of each vessel. Bilateral testing is considered an integral part of a complete examination. Limited examinations for reoccurring indications may be performed as noted.  Abdominal Aorta Findings: +------------+-------+----------+----------+---------+--------+--------+ Location    AP (cm)Trans (cm)PSV (cm/s)Waveform ThrombusComments +------------+-------+----------+----------+---------+--------+--------+ Distal  6.06   6.03      114       triphasic                 +------------+-------+----------+----------+---------+--------+--------+ RT limb Prox1.2    1.2       114       triphasic                 +------------+-------+----------+----------+---------+--------+--------+ LT limb Prox1.1    1.1       -         -                         +------------+-------+----------+----------+---------+--------+--------+  Summary: IVC/Iliac: Patent stent of the abdominal aorta with a maximum native aortic measurement of 6.06 x 6.03.  *See table(s) above for measurements and observations.  Electronically signed by Fabienne Bruns MD on 02/08/2018 at 1:44:14 PM.   Final      LOS: 1 day   Jeoffrey Massed, MD  [facility]  If 7PM-7AM, please contact night-coverage  Please page via [email or web address]-Password [email or web address]-click on MD name and type text  message  02/09/2018, 12:20 PM

## 2018-02-09 NOTE — Evaluation (Signed)
Physical Therapy Evaluation Patient Details Name: Corey CaroliCarl Mcdonald MRN: [id number] DOB: 1923/05/03 Today's Date: 02/09/2018   History of Present Illness  Pt is a 84 y.o. male admitted from [facility] on 02/08/18 with weakness and low hemoglobin. Worked up for symptomatic anemia with heme positive stool without overt bleeding on Eliquis. Plan for EGD with propofol on 12/21. PMH includes HTN, macular degeneration, legally blind, PVD, CAD, AAA.    Clinical Impression  Pt presents with an overall decrease in functional mobility secondary to above. PTA, pt resides at ILF; lives alone and independent with mobility, ambulating to/from dining hall well. Today, pt required min-modA for mobility, minA to ambulate short distance with RW. Children present; discussed recommendation for short-term SNF-level therapies to maximize functional mobility and independence prior to return to home, as pt currently requiring increased assist for mobility and ADLs. Will follow acutely to address established goals.    Follow Up Recommendations SNF;Supervision/Assistance - 24 hour    Equipment Recommendations  None recommended by PT    Recommendations for Other Services       Precautions / Restrictions Precautions Precautions: Fall;Other (comment) Precaution Comments: Legally blind Restrictions Weight Bearing Restrictions: No      Mobility  Bed Mobility Overal bed mobility: Needs Assistance Bed Mobility: Supine to Sit;Sit to Supine     Supine to sit: Mod assist Sit to supine: Mod assist   General bed mobility comments: ModA to assist trunk elevation to EOB; modA to assist BLEs into supine  Transfers Overall transfer level: Needs assistance Equipment used: Rolling walker (2 wheeled);2 person hand held assist Transfers: Sit to/from UGI CorporationStand;Stand Pivot Transfers Sit to Stand: Min assist Stand pivot transfers: Min assist       General transfer comment: Cues for correct hand placement, pt able to stand with min  guard, requiring minA to prevent posterior LOB when transitioning hands to RW. Able to perform stand pivot from bed<>BSC 1x with bilat HHA and minA, and again with RW and minA  Ambulation/Gait Ambulation/Gait assistance: Min assist Gait Distance (Feet): 20 Feet Assistive device: Rolling walker (2 wheeled) Gait Pattern/deviations: Step-to pattern;Trunk flexed Gait velocity: Decreased Gait velocity interpretation: <1.8 ft/sec, indicate of risk for recurrent falls General Gait Details: Slow, unsteady amb in room with RW and minA for balance; pt with intermittent self-corrected knee instability. Required cues for direction and cues for problem solving RW use  Stairs            Wheelchair Mobility    Modified Rankin (Stroke Patients Only)       Balance Overall balance assessment: Needs assistance Sitting-balance support: Bilateral upper extremity supported;No upper extremity supported Sitting balance-Leahy Scale: Fair Sitting balance - Comments: Reliant on BUE support with LE MMT     Standing balance-Leahy Scale: Poor Standing balance comment: Reliant on UE support                             Pertinent Vitals/Pain Pain Assessment: No/denies pain    Home Living Family/patient expects to be discharged to:: Assisted living               Home Equipment: Walker - 2 wheels;Bedside commode;Grab bars - tub/shower;Grab bars - toilet;Cane - single point Additional Comments: Resident at ILF    Prior Function Level of Independence: Independent with assistive device(s)         Comments: Pt lives alone in apartment at ILF. Intermittent use of RW in his room. Ambulatory to  dining hall without DME. Remains active. Legally blind     Hand Dominance        Extremity/Trunk Assessment   Upper Extremity Assessment Upper Extremity Assessment: Generalized weakness    Lower Extremity Assessment Lower Extremity Assessment: Generalized weakness    Cervical /  Trunk Assessment Cervical / Trunk Assessment: Kyphotic  Communication   Communication: No difficulties  Cognition Arousal/Alertness: Awake/alert Behavior During Therapy: WFL for tasks assessed/performed Overall Cognitive Status: Impaired/Different from baseline Area of Impairment: Orientation;Attention;Memory;Following commands;Safety/judgement;Awareness;Problem solving                 Orientation Level: Disoriented to;Place;Time;Situation Current Attention Level: Sustained Memory: Decreased short-term memory Following Commands: Follows one step commands with increased time;Follows one step commands inconsistently Safety/Judgement: Decreased awareness of safety;Decreased awareness of deficits Awareness: Intellectual Problem Solving: Requires verbal cues        General Comments General comments (skin integrity, edema, etc.): Daughter and son present    Exercises     Assessment/Plan    PT Assessment Patient needs continued PT services  PT Problem List Decreased strength;Decreased activity tolerance;Decreased balance;Decreased mobility;Decreased cognition;Decreased knowledge of use of DME;Decreased safety awareness       PT Treatment Interventions DME instruction;Gait training;Stair training;Functional mobility training;Therapeutic exercise;Balance training;Patient/family education;Therapeutic activities    PT Goals (Current goals can be found in the Care Plan section)  Acute Rehab PT Goals Patient Stated Goal: Get stronger so he can return to ILF PT Goal Formulation: With family Time For Goal Achievement: 02/23/18 Potential to Achieve Goals: Good    Frequency Min 2X/week   Barriers to discharge Decreased caregiver support      Co-evaluation               AM-PAC PT "6 Clicks" Mobility  Outcome Measure Help needed turning from your back to your side while in a flat bed without using bedrails?: A Little Help needed moving from lying on your back to sitting  on the side of a flat bed without using bedrails?: A Lot Help needed moving to and from a bed to a chair (including a wheelchair)?: A Little Help needed standing up from a chair using your arms (e.g., wheelchair or bedside chair)?: A Little Help needed to walk in hospital room?: A Little Help needed climbing 3-5 steps with a railing? : A Lot 6 Click Score: 16    End of Session Equipment Utilized During Treatment: Gait belt Activity Tolerance: Patient tolerated treatment well Patient left: in bed;with call bell/phone within reach;with bed alarm set;with family/visitor present Nurse Communication: Mobility status PT Visit Diagnosis: Other abnormalities of gait and mobility (R26.89);Unsteadiness on feet (R26.81)    Time: [id number] PT Time Calculation (min) (ACUTE ONLY): 34 min   Charges:   PT Evaluation $PT Eval Moderate Complexity: 1 Mod PT Treatments $Therapeutic Activity: 8-22 mins      Corey HomesJaclyn Linc Mcdonald, PT, DPT Acute Rehabilitation Services  Pager [phone] Office [phone]  Corey ChamberJaclyn L Percy Mcdonald 02/09/2018, 4:42 PM

## 2018-02-09 NOTE — Progress Notes (Signed)
Patient's son [name] expressing concerns over Eliquis dosage, possibly too much. Has spoken to PCP in regards to.

## 2018-02-09 NOTE — Consult Note (Signed)
Referring Provider: Dr. Mikeal Hawthorne Primary Care Physician:  Shirline Frees, NP   Reason for Consultation:  Anemia; Heme positive stool  HPI: Corey Mcdonald is a 84 y.o. male with multiple medical problems who also is on Eliquis for Afib being seen for heme positive stool and symptomatic anemia with weakness. Denies melena or hematochezia. Denies abdominal pain, nausea, vomiting, heartburn, or dysphagia. History of GI bleeding in 2016 and EGD/colon by Dr. Ewing Schlein showed a small hiatal hernia and sigmoid diverticulosis and medium-sized internal hemorrhoids. Hgb 6.9 (9.8 in 10/19). Hgb 8.5 following 2 U PRBCs. Feels ok.  Past Medical History:  Diagnosis Date  . AAA (abdominal aortic aneurysm) (HCC)   . ABDOMINAL AORTIC ANEURYSM   . Anemia January 2012   admitted with acute MI March 15, 2010 and has significant acute blood loss anemia requiring transfusions of two units of packed RBCs.  Status post upper endoscopy March 22, 2010 without High-Risk bleeding lesion.  History of peptic ulcer disease  . Atrial fibrillation (HCC)   . BENIGN PROSTATIC HYPERTROPHY, HX OF    . COLONIC POLYPS, HX OF   . CORONARY ARTERY DISEASE    non-ST segment MI, March 15, 2010  . History of Doppler ultrasound 05/2011   h/o claudication, stable ABIs  . History of nuclear stress test 2007   persantine; normal, low risk   . HYPERLIPIDEMIA   . HYPERTENSION   . MACULAR DEGENERATION   . Myocardial infarction (HCC) 02/2010  . PAD (peripheral artery disease) (HCC)   . PEPTIC ULCER DISEASE, HX OF   . PVD (peripheral vascular disease) (HCC)   . RESTLESS LEGS SYNDROME   . SLEEP APNEA   . VERTIGO     Past Surgical History:  Procedure Laterality Date  . ABDOMINAL AORTIC ANEURYSM REPAIR     stenting   . CARDIAC CATHETERIZATION  02/2010   r/t NSTEMI; loss of SVG to RCA  . CATARACT EXTRACTION    . COLONOSCOPY WITH PROPOFOL N/A 02/04/2015   Procedure: COLONOSCOPY WITH PROPOFOL;  Surgeon: Charlott Rakes, MD;  Location: [facility]  ENDOSCOPY;  Service: Endoscopy;  Laterality: N/A;  . CORONARY ARTERY BYPASS GRAFT  12/1995   x5; LIMA to ALD, vein graft to RCA (now occluded),SVG to OM, SVG to diagonal  . ESOPHAGOGASTRODUODENOSCOPY N/A 01/28/2015   Procedure: ESOPHAGOGASTRODUODENOSCOPY (EGD);  Surgeon: Vida Rigger, MD;  Location: [facility] ENDOSCOPY;  Service: Endoscopy;  Laterality: N/A;  . INGUINAL HERNIA REPAIR     Left inguinal  . TRANSTHORACIC ECHOCARDIOGRAM  04/2011   EF 55-60%; mild LVH; grade 1 diastolic dysfunction    Prior to Admission medications   Medication Sig Start Date End Date Taking? Authorizing Provider  acetaminophen (TYLENOL) 325 MG tablet Take 650 mg by mouth every 6 (six) hours as needed for moderate pain.    Yes [provider]  atorvastatin (LIPITOR) 20 MG tablet Take 1 tablet (20 mg total) by mouth daily. Patient taking differently: Take 20 mg by mouth at bedtime.  10/27/17  Yes Nafziger, Kandee Keen, NP  docusate sodium (COLACE) 100 MG capsule Take 100 mg by mouth daily before supper.    Yes [provider]  ELIQUIS 5 MG TABS tablet TAKE 1 TABLET BY MOUTH TWICE DAILY Patient taking differently: Take 5 mg by mouth 2 (two) times daily.  08/01/17  Yes Hilty, Lisette Abu, MD  Ferrous Gluconate (IRON) 240 (27 Fe) MG TABS Take 1 tablet by mouth at bedtime.    Yes [provider]  furosemide (LASIX) 20 MG tablet  Take 40mg  by mouth in the morning and 20mg  in the afternoon. Patient taking differently: Take 20-40 mg by mouth See admin instructions. Take 40 mg by mouth in the morning and 20 mg in the afternoon. 01/05/18  Yes Hilty, Lisette AbuKenneth C, MD  Multiple Vitamin (MULTIVITAMIN WITH MINERALS) TABS tablet Take 1 tablet by mouth daily.   Yes [provider]  nitroGLYCERIN (NITROSTAT) 0.4 MG SL tablet Place 1 tablet (0.4 mg total) under the tongue every 5 (five) minutes as needed. FOR CHEST PAIN Patient taking differently: Place 0.4 mg under the tongue every 5 (five) minutes as needed for chest  pain.  01/20/15  Yes Hilty, Lisette AbuKenneth C, MD  pantoprazole (PROTONIX) 40 MG tablet TAKE 1 TABLET BY MOUTH ONCE DAILY Patient taking differently: Take 40 mg by mouth daily before breakfast.  04/13/17  Yes Hilty, Lisette AbuKenneth C, MD  PRESCRIPTION MEDICATION Inhale into the lungs See admin instructions. CPAP (pressure is set at 10): Use at bedtime and during all naps   Yes [provider]  tamsulosin (FLOMAX) 0.4 MG CAPS capsule TAKE 1 CAPSULE BY MOUTH ONCE DAILY IN THE EVENING. Patient taking differently: Take 0.4 mg by mouth every evening.  02/07/18  Yes Burchette, Elberta FortisBruce W, MD  tobramycin (TOBREX) 0.3 % ophthalmic solution Place 2 drops into both eyes every 4 (four) hours. 02/07/18  Yes Burchette, Elberta FortisBruce W, MD  metoprolol tartrate (LOPRESSOR) 25 MG tablet TAKE 1 TABLET BY MOUTH TWICE DAILY Patient not taking: No sig reported 10/13/17   Rollene RotundaHochrein, James, MD  carbidopa-levodopa (SINEMET CR) 25-100 MG per tablet Take by mouth. 1/2 tablet at bedtime   05/10/11  [provider]    Scheduled Meds: . atorvastatin  20 mg Oral QHS  . docusate sodium  100 mg Oral QAC supper  . ferrous sulfate  325 mg Oral QHS  . metoprolol tartrate  25 mg Oral BID  . multivitamin with minerals  1 tablet Oral Daily  . [START ON 02/12/2018] pantoprazole  40 mg Intravenous Q12H  . tamsulosin  0.4 mg Oral QPM  . tobramycin  2 drop Both Eyes Q4H   Continuous Infusions: . pantoprazole (PROTONIX) IVPB    . pantoprozole (PROTONIX) infusion     PRN Meds:.acetaminophen **OR** acetaminophen, labetalol, nitroGLYCERIN, ondansetron **OR** ondansetron (ZOFRAN) IV  Allergies as of 02/08/2018 - Review Complete 02/08/2018  Allergen Reaction Noted  . Cilostazol Other (See Comments) 12/24/2010  . Doxazosin Other (See Comments) 12/24/2010    Family History  Problem Relation Age of Onset  . Heart disease Mother   . Heart disease Father   . Stroke Father   . Heart disease Sister   . Macular degeneration Sister   . Heart  disease Sister   . Stroke Brother   . Hypertension Sister   . Heart Problems Sister        x2    Social History   Socioeconomic History  . Marital status: Married    Spouse name: Not on file  . Number of children: 2  . Years of education: Not on file  . Highest education level: Not on file  Occupational History  . Not on file  Social Needs  . Financial resource strain: Not on file  . Food insecurity:    Worry: Not on file    Inability: Not on file  . Transportation needs:    Medical: Not on file    Non-medical: Not on file  Tobacco Use  . Smoking status: Former Smoker  Packs/day: 1.00    Years: 40.00    Pack years: 40.00    Types: Cigarettes    Last attempt to quit: 02/22/1987    Years since quitting: 30.9  . Smokeless tobacco: Never Used  Substance and Sexual Activity  . Alcohol use: No  . Drug use: No  . Sexual activity: Not on file  Lifestyle  . Physical activity:    Days per week: Not on file    Minutes per session: Not on file  . Stress: Not on file  Relationships  . Social connections:    Talks on phone: Not on file    Gets together: Not on file    Attends religious service: Not on file    Active member of club or organization: Not on file    Attends meetings of clubs or organizations: Not on file    Relationship status: Not on file  . Intimate partner violence:    Fear of current or ex partner: Not on file    Emotionally abused: Not on file    Physically abused: Not on file    Forced sexual activity: Not on file  Other Topics Concern  . Not on file  Social History Narrative  . Not on file    Review of Systems: All negative except as stated above in HPI.  Physical Exam: Vital signs: Vitals:   02/09/18 0446 02/09/18 0830  BP: 117/63 126/61  Pulse: 89 80  Resp: 20   Temp: (!) 97.5 F (36.4 C)   SpO2: 91%      General:   Lethargic, elderly, frail, well-nourished, no acute distress Head: normocephalic, atraumatic Eyes: cloudy  sclera ENT: oropharynx clear Neck: supple, nontender Lungs:  Clear throughout to auscultation.   No wheezes, crackles, or rhonchi. No acute distress. Heart:  Regular rate and rhythm; no murmurs, clicks, rubs,  or gallops. Abdomen: minimal RLQ tenderness without guarding, soft, nondistended, +BS  Rectal:  Deferred Ext: no edema  GI:  Lab Results: Recent Labs    02/08/18 1642 02/09/18 0442 02/09/18 0752  WBC 6.0 5.6 5.3  HGB 6.8* 8.0* 8.5*  HCT 23.7* 25.6* 27.9*  PLT 308 288 309   BMET Recent Labs    02/07/18 1555 02/08/18 1642 02/09/18 0442  NA 138 139 138  K 4.6 4.1 3.9  CL 102 103 105  CO2 27 23 22   GLUCOSE 87 102* 114*  BUN 40* 44* 40*  CREATININE 1.92* 1.98* 1.66*  CALCIUM 8.8 9.1 8.6*   LFT Recent Labs    02/09/18 0442  PROT 5.9*  ALBUMIN 2.8*  AST 17  ALT 18  ALKPHOS 54  BILITOT 2.0*   PT/INR Recent Labs    02/08/18 1642  LABPROT 15.6*  INR 1.26     Studies/Results: Vas [place] Evar Duplex  Result Date: 02/08/2018 Endovascular Aortic Repair Study (EVAR) Indications: Follow up exam for EVAR. Surgery date 2012.  Performing Technologist: Elita Quick RVT  Examination Guidelines: A complete evaluation includes B-mode imaging, spectral Doppler, color Doppler, and power Doppler as needed of all accessible portions of each vessel. Bilateral testing is considered an integral part of a complete examination. Limited examinations for reoccurring indications may be performed as noted.  Abdominal Aorta Findings: +------------+-------+----------+----------+---------+--------+--------+ Location    AP (cm)Trans (cm)PSV (cm/s)Waveform ThrombusComments +------------+-------+----------+----------+---------+--------+--------+ Distal      6.06   6.03      114       triphasic                 +------------+-------+----------+----------+---------+--------+--------+  RT limb Prox1.2    1.2       114       triphasic                  +------------+-------+----------+----------+---------+--------+--------+ LT limb Prox1.1    1.1       -         -                         +------------+-------+----------+----------+---------+--------+--------+  Summary: IVC/Iliac: Patent stent of the abdominal aorta with a maximum native aortic measurement of 6.06 x 6.03.  *See table(s) above for measurements and observations.  Electronically signed by Fabienne Brunsharles Fields MD on 02/08/2018 at 1:44:14 PM.   Final     Impression/Plan: Symptomatic anemia with heme positive stool without overt bleeding on Eliquis. I think he needs an updated EGD to look for peptic ulcer disease since Eliquis will like be started back at discharge. I do not think he needs an updated colonoscopy due to his age and comorbidities and would manage conservatively pending EGD findings. No availability for Propofol today. Clear liquid diet today. NPO p MN. EGD tomorrow with Propofol sedation by Dr. Matthias HughsBuccini.    LOS: 1 day   Shirley FriarVincent C Needham Biggins  02/09/2018, 9:49 AM  Questions please call [phone]

## 2018-02-10 ENCOUNTER — Inpatient Hospital Stay (HOSPITAL_COMMUNITY): Payer: Medicare Other | Admitting: Anesthesiology

## 2018-02-10 ENCOUNTER — Encounter (HOSPITAL_COMMUNITY)
Admission: EM | Disposition: A | Discharge status: Home Health | Payer: Self-pay | Source: Home / Self Care | Attending: Internal Medicine

## 2018-02-10 ENCOUNTER — Encounter (HOSPITAL_COMMUNITY): Payer: Self-pay | Admitting: *Deleted

## 2018-02-10 HISTORY — PX: ESOPHAGOGASTRODUODENOSCOPY (EGD) WITH PROPOFOL: SHX5813

## 2018-02-10 LAB — BASIC METABOLIC PANEL
Anion gap: 9 (ref 5–15)
BUN: 30 mg/dL — ABNORMAL HIGH (ref 8–23)
CHLORIDE: 105 mmol/L (ref 98–111)
CO2: 22 mmol/L (ref 22–32)
Calcium: 8.6 mg/dL — ABNORMAL LOW (ref 8.9–10.3)
Creatinine, Ser: 1.63 mg/dL — ABNORMAL HIGH (ref 0.61–1.24)
GFR calc Af Amer: 42 mL/min — ABNORMAL LOW (ref >60)
GFR calc non Af Amer: 36 mL/min — ABNORMAL LOW (ref >60)
Glucose, Bld: 123 mg/dL — ABNORMAL HIGH (ref 70–99)
POTASSIUM: 3.8 mmol/L (ref 3.5–5.1)
Sodium: 136 mmol/L (ref 135–145)

## 2018-02-10 LAB — CBC
HCT: 27.4 % — ABNORMAL LOW (ref 39.0–52.0)
Hemoglobin: 8.2 g/dL — ABNORMAL LOW (ref 13.0–17.0)
MCH: 27 pg (ref 26.0–34.0)
MCHC: 29.9 g/dL — ABNORMAL LOW (ref 30.0–36.0)
MCV: 90.1 fL (ref 80.0–100.0)
NRBC: 0 % (ref 0.0–0.2)
Platelets: 246 10*3/uL (ref 150–400)
RBC: 3.04 MIL/uL — AB (ref 4.22–5.81)
RDW: 16 % — ABNORMAL HIGH (ref 11.5–15.5)
WBC: 6.6 10*3/uL (ref 4.0–10.5)

## 2018-02-10 SURGERY — ESOPHAGOGASTRODUODENOSCOPY (EGD) WITH PROPOFOL
Anesthesia: Monitor Anesthesia Care

## 2018-02-10 MED ORDER — PROPOFOL 500 MG/50ML IV EMUL
INTRAVENOUS | Status: DC | PRN
  Administered 2018-02-10: 50 ug/kg/min via INTRAVENOUS

## 2018-02-10 MED ORDER — LACTATED RINGERS IV SOLN
INTRAVENOUS | Status: DC | PRN
  Administered 2018-02-10: 10:00:00 via INTRAVENOUS

## 2018-02-10 MED ORDER — SODIUM CHLORIDE 0.9 % IV SOLN: INTRAVENOUS | Status: DC

## 2018-02-10 MED ORDER — METOPROLOL TARTRATE 5 MG/5ML IV SOLN
5.0000 mg | Freq: Once | INTRAVENOUS | Status: AC
  Administered 2018-02-10: 5 mg via INTRAVENOUS
  Filled 2018-02-10: qty 5

## 2018-02-10 MED ORDER — LACTATED RINGERS IV SOLN
INTRAVENOUS | Status: AC | PRN
  Administered 2018-02-10: 1000 mL via INTRAVENOUS

## 2018-02-10 MED ORDER — APIXABAN 2.5 MG PO TABS
2.5000 mg | ORAL_TABLET | Freq: Two times a day (BID) | ORAL | Status: DC
  Administered 2018-02-10 – 2018-02-11 (×3): 2.5 mg via ORAL
  Filled 2018-02-10 (×3): qty 1

## 2018-02-10 MED ORDER — HALOPERIDOL LACTATE 5 MG/ML IJ SOLN
5.0000 mg | Freq: Once | INTRAMUSCULAR | Status: AC
  Administered 2018-02-10: 5 mg via INTRAVENOUS
  Filled 2018-02-10: qty 1

## 2018-02-10 MED ORDER — PANTOPRAZOLE SODIUM 40 MG PO TBEC
40.0000 mg | DELAYED_RELEASE_TABLET | Freq: Every day | ORAL | Status: DC
  Administered 2018-02-11: 40 mg via ORAL
  Filled 2018-02-10: qty 1

## 2018-02-10 SURGICAL SUPPLY — 15 items

## 2018-02-10 NOTE — Progress Notes (Addendum)
MD notified Afib with HR peak at 140 at 0810. Administered MD order for 5mg  metoprolol IV push; patient NPO for procedure and not able to take oral metoprolol.    Notified by telemetry patient HR peaks at 140 non-sustained then returns to 110's. MD notified at [id number]. Will continue to monitor and notify MD if HR sustained in 140's; informed night nurse to follow-up with need to administer metoprolol IV push if needed.

## 2018-02-10 NOTE — Anesthesia Postprocedure Evaluation (Signed)
Anesthesia Post Note  Patient: Corey Mcdonald  Procedure(s) Performed: ESOPHAGOGASTRODUODENOSCOPY (EGD) WITH PROPOFOL (N/A )     Patient location during evaluation: Endoscopy Anesthesia Type: MAC Level of consciousness: awake and alert Pain management: pain level controlled Vital Signs Assessment: post-procedure vital signs reviewed and stable Respiratory status: spontaneous breathing, nonlabored ventilation, respiratory function stable and patient connected to nasal cannula oxygen Cardiovascular status: stable and blood pressure returned to baseline Postop Assessment: no apparent nausea or vomiting Anesthetic complications: no    Last Vitals:  Vitals:   02/10/18 1033 02/10/18 1035  BP:  (!) 90/45  Pulse:  98  Resp: 18 (!) 25  Temp:    SpO2:  94%    Last Pain:  Vitals:   02/10/18 1035  TempSrc:   PainSc: 0-No pain                 Neldon Shepard COKER

## 2018-02-10 NOTE — Interval H&P Note (Signed)
History and Physical Interval Note:  02/10/2018 10:07 AM  Corey Mcdonald  has presented today for surgery, with the diagnosis of anemia, heme postive stool  The various methods of treatment have been discussed with the patient and family. After consideration of risks, benefits and other options for treatment, the patient has consented to  Procedure(s): ESOPHAGOGASTRODUODENOSCOPY (EGD) WITH PROPOFOL (N/A) as a surgical intervention .  The patient's history has been reviewed, patient examined, no change in status, stable for surgery.  I have reviewed the patient's chart and labs.  Questions were answered to the patient's satisfaction.     [name]

## 2018-02-10 NOTE — Op Note (Signed)
[facility] Patient Name: Corey Mcdonald Procedure Date : 02/10/2018 MRN: [id number] Attending MD: Bernette Redbird , MD Date of Birth: 01-28-1924 CSN: [id number] Age: 84 Admit Type: Inpatient Procedure:                Upper GI endoscopy Indications:              Iron deficiency anemia, , Heme positive stool (hgb                            6.8, iron sat 3%) and in a patient on Eliquis for a                            fib and with similar presentation 3 years ago with                            negative egd/colon at that time Providers:                Bernette Redbird, MD, Bonney Leitz, Verita Schneiders,                            Technician, Dairl Ponder, CRNA Referring MD:              Medicines:                Monitored Anesthesia Care Complications:            No immediate complications. Estimated Blood Loss:     Estimated blood loss: none. Procedure:                Pre-Anesthesia Assessment:                           - Prior to the procedure, a History and Physical                            was performed, and patient medications and                            allergies were reviewed. The patient's tolerance of                            previous anesthesia was also reviewed. The risks                            and benefits of the procedure and the sedation                            options and risks were discussed with the patient.                            All questions were answered, and informed consent                            was obtained. Prior Anticoagulants: The patient has  taken Eliquis (apixaban), last dose was 3 days                            prior to procedure. ASA Grade Assessment: III - A                            patient with severe systemic disease. After                            reviewing the risks and benefits, the patient was                            deemed in satisfactory condition to undergo the           procedure.                           After obtaining informed consent, the endoscope was                            passed under direct vision. Throughout the                            procedure, the patient's blood pressure, pulse, and                            oxygen saturations were monitored continuously. The                            GIF-H190 ([id number] Olympus Adult EGD was introduced                            through the mouth, and advanced to the second part                            of duodenum. The upper GI endoscopy was                            accomplished without difficulty. The patient                            tolerated the procedure well. Scope In: Scope Out: Findings:      The larynx was normal.      A 3 cm hiatal hernia was noted. Careful inspection showed no evidence of       Cameron erosions.      A moderate Schatzki ring was found at the gastroesophageal junction. It       did not present resistance to passage of the 10 mm endoscope.      The exam of the esophagus was otherwise normal.      The entire examined stomach was normal. No Cameron erosions at the       diaphragmatic hiatus.      There is no endoscopic evidence of inflammation, ulceration, mass or       erosion in the entire examined stomach.      The  cardia and gastric fundus were normal on retroflexion.      The examined duodenum was normal, including careful inspection of the       bulb. Impression:               - Normal larynx.                           - Moderate hiatal hernia with moderate Schatzki                            ring.                           - Normal stomach.                           - Normal examined duodenum.                           - No specimens collected. Recommendation:           - Continue present medications.                           - OK to resume Eliquis (apixaban) at prior dose                            today, at discretion of attending physician.                            - In view of patient's age and fairly recently                            negative colonoscopy, and the low likelihood of                            finding treatable pathology on a capsule endoscopy,                            I would not favor further GI workup in this                            patient, but instead would advocate aggressive iron                            supplementation, by IV route if necessary, with the                            hope that the patient can keep up with his occult                            blood loss. Procedure Code(s):        --- Professional ---                           [phone], Esophagogastroduodenoscopy, flexible,  transoral; diagnostic, including collection of                            specimen(s) by brushing or washing, when performed                            (separate procedure) Diagnosis Code(s):        --- Professional ---                           K22.2, Esophageal obstruction                           D50.9, Iron deficiency anemia, unspecified                           R19.5, Other fecal abnormalities CPT copyright 2018 [employer]. All rights reserved. The codes documented in this report are preliminary and upon coder review may  be revised to meet current compliance requirements. Bernette Redbird, MD 02/10/2018 10:40:33 AM This report has been signed electronically. Number of Addenda: 0

## 2018-02-10 NOTE — Progress Notes (Signed)
Upper endoscopy was well-tolerated and was negative except for a moderate sized hiatal hernia with a Schatzki's ring.  Recommendations:  1.  I did not dilate the Schatzki's ring since there was no mention of dysphagia antecedent to the procedure.  Consideration could be given to dilatation if significant dysphagia symptoms were to develop in the future.  2.  It is unclear whether or not the patient's hiatal hernia is contributing to his heme positivity and iron deficiency anemia.  Normally, we see this only with somewhat larger hiatal hernias than the 3 cm hiatal hernia this patient had.  3.  Given the patient's age and the fact that he has had a colonoscopy within the past several years, and given that the diagnostic yield for treatable pathology on a capsule endoscopy in an elderly patient like this would be quite low, I would not favor any further GI work-up.  Accordingly, I have put the patient on a solid food diet.  4.  I have switched the patient's Protonix from IV, to his home regimen of 40 mg orally daily.  5.  Given the absence of a bleeding source in the upper tract, I feel it is appropriate to resume anticoagulation in this patient, at your discretion.  I do not see any contraindication to resumption of anticoagulation.  6.  For treatment of the patient's anemia, I think the most important thing will be adequate iron supplementation.  He was on oral iron prior to admission, yet still only had an iron saturation of 3%.  Therefore, this patient probably needs intravenous iron, at least until his iron saturation is brought up into the normal range, after which perhaps oral iron therapy, if tolerated by the patient, might be sufficient for maintenance.  Frequent monitoring of hemoglobin level and iron level as an outpatient. will be needed so that hopefully this patient does not slip back into severe anemia.  Since the patient has significant chronic underlying renal insufficiency, he might also  need Aranesp to help his bone marrow produce red cells.  Hematology consultation as an outpatient might be helpful in management of this patient's chronic anemia.  7.  I will sign off.  Please call us if we can be of further assistance in this patient's care.  Florencia Reasonsobert V. Ivadell Gaul, M.D. Pager [phone] If no answer or after 5 PM call [phone]

## 2018-02-10 NOTE — Transfer of Care (Signed)
Immediate Anesthesia Transfer of Care Note  Patient: Corey Mcdonald  Procedure(s) Performed: ESOPHAGOGASTRODUODENOSCOPY (EGD) WITH PROPOFOL (N/A )  Patient Location: PACU  Anesthesia Type:MAC  Level of Consciousness: awake, alert , oriented and patient cooperative  Airway & Oxygen Therapy: Patient Spontanous Breathing and Patient connected to nasal cannula oxygen  Post-op Assessment: Report given to RN and Post -op Vital signs reviewed and stable  Post vital signs: Reviewed and stable  Last Vitals:  Vitals Value Taken Time  BP 121/64 02/10/2018 10:37 AM  Temp    Pulse 97 02/10/2018 10:36 AM  Resp 20 02/10/2018 10:39 AM  SpO2 96 % 02/10/2018 10:36 AM  Vitals shown include unvalidated device data.  Last Pain:  Vitals:   02/10/18 1035  TempSrc:   PainSc: 0-No pain         Complications: No apparent anesthesia complications and Patient re-intubated

## 2018-02-10 NOTE — Anesthesia Preprocedure Evaluation (Signed)
Anesthesia Evaluation  Patient identified by MRN, date of birth, ID band Patient awake    Reviewed: Allergy & Precautions, NPO status , Patient's Chart, lab work & pertinent test results  Airway Mallampati: II  TM Distance: >3 FB     Dental  (+) Teeth Intact   Pulmonary former smoker,    breath sounds clear to auscultation       Cardiovascular hypertension,  Rhythm:Irregular Rate:Normal     Neuro/Psych    GI/Hepatic   Endo/Other    Renal/GU      Musculoskeletal   Abdominal   Peds  Hematology   Anesthesia Other Findings   Reproductive/Obstetrics                             Anesthesia Physical Anesthesia Plan  ASA: III  Anesthesia Plan: MAC   Post-op Pain Management:    Induction: Intravenous  PONV Risk Score and Plan: Ondansetron and Dexamethasone  Airway Management Planned: Natural Airway and Nasal Cannula  Additional Equipment:   Intra-op Plan:   Post-operative Plan:   Informed Consent: I have reviewed the patients History and Physical, chart, labs and discussed the procedure including the risks, benefits and alternatives for the proposed anesthesia with the patient or authorized representative who has indicated his/her understanding and acceptance.     Plan Discussed with: CRNA and Anesthesiologist  Anesthesia Plan Comments:         Anesthesia Quick Evaluation

## 2018-02-10 NOTE — Progress Notes (Signed)
ANTICOAGULATION CONSULT NOTE - Initial Consult  Pharmacy Consult for Apixaban Indication: atrial fibrillation  Allergies  Allergen Reactions  . Cilostazol Other (See Comments)    Adverse reaction per patient - leg cramps  . Doxazosin Other (See Comments)    Leg cramps    Patient Measurements: Height: 5\' 3"  (160 cm) Weight: 208 lb (94.3 kg) IBW/kg (Calculated) : 56.9   Vital Signs: Temp: 98.1 F (36.7 C) (12/21 1035) Temp Source: Oral (12/21 1035) BP: 131/75 (12/21 1055) Pulse Rate: 82 (12/21 1055)  Labs: Recent Labs    02/08/18 1642 02/09/18 0442 02/09/18 0752 02/10/18 0356  HGB 6.8* 8.0* 8.5* 8.2*  HCT 23.7* 25.6* 27.9* 27.4*  PLT 308 288 309 246  LABPROT 15.6*  --   --   --   INR 1.26  --   --   --   CREATININE 1.98* 1.66*  --  1.63*    Estimated Creatinine Clearance: 29.4 mL/min (A) (by C-G formula based on SCr of 1.63 mg/dL (H)).   Medical History: Past Medical History:  Diagnosis Date  . AAA (abdominal aortic aneurysm) (HCC)   . ABDOMINAL AORTIC ANEURYSM   . Anemia January 2012   admitted with acute MI March 15, 2010 and has significant acute blood loss anemia requiring transfusions of two units of packed RBCs.  Status post upper endoscopy March 22, 2010 without High-Risk bleeding lesion.  History of peptic ulcer disease  . Atrial fibrillation (HCC)   . BENIGN PROSTATIC HYPERTROPHY, HX OF    . COLONIC POLYPS, HX OF   . CORONARY ARTERY DISEASE    non-ST segment MI, March 15, 2010  . History of Doppler ultrasound 05/2011   h/o claudication, stable ABIs  . History of nuclear stress test 2007   persantine; normal, low risk   . HYPERLIPIDEMIA   . HYPERTENSION   . MACULAR DEGENERATION   . Myocardial infarction (HCC) 02/2010  . PAD (peripheral artery disease) (HCC)   . PEPTIC ULCER DISEASE, HX OF   . PVD (peripheral vascular disease) (HCC)   . RESTLESS LEGS SYNDROME   . SLEEP APNEA   . VERTIGO    Assessment: 84 yo patient with h/o afib on  apixaban and admission for symptomatic anemia with FOBT +. ?Has been cleared to have apixiban restarted by GI.   Patient on 5mg  PO BID PTA, however, with age 84>80 and Scr >1.5 ( Scr 1.63, BL 1.79 on 12/6) patient should have dose adjusted to 2.5mg  BID on discharge.   Goal of Therapy:  Monitor platelets by anticoagulation protocol: Yes   Plan:  Apixaban 2.5mg  PO BID Monitor for s/sx of bleeding  Elayna Tobler A. Jeanella CrazePierce, PharmD, BCPS Clinical Pharmacist [place] Pager: [phone] Please utilize Amion for appropriate phone number to reach the unit pharmacist [facility]   02/10/2018,1:33 PM

## 2018-02-10 NOTE — Progress Notes (Signed)
PROGRESS NOTE        PATIENT DETAILS Name: Corey Mcdonald Age: 84 y.o. Sex: male Date of Birth: Jan 08, 1924 Admit Date: 02/08/2018 Admitting Physician Rometta EmeryMohammad L Garba, MD WUJ:[id number]:Nafziger, Kandee Keenory, NP  Brief Narrative: Patient is a 84 y.o. male prior history of GI bleeding in 2016 (underwent EGD/colonoscopy-showed hiatal hernia and sigmoid diverticulosis / hemorrhoids) A. fib on Eliquis-sent by PCP for evaluation of symptomatic anemia, found to have a hemoglobin of 6.9 with FOBT positive stools without any overt GI bleeding.  Admitted and given 2 units of PRBC.  GI consultation in progress.  See below for further details  Subjective:  Patient in bed, appears comfortable, denies any headache, no fever, no chest pain or pressure, no shortness of breath , no abdominal pain. No focal weakness.   Assessment/Plan:  Anemia: Which appears iron deficiency, likely due to chronic occult GI blood loss, this was found incidentally due to routine blood work with heme positive stool.  Status post units of packed RBC transfusion on 02/08/2018 with stable posttransfusion H&H.  Seen by GI underwent EGD showing Schatzki ring no signs of ongoing bleeding.  Per GI okay to resume Eliquis and PPI.  No further GI work-up.  Due to his advanced age intermittent 1 to 2 units of blood transfusion in 1 to 2 years is an acceptable risk with ongoing Eliquis use.  Chronic atrial fibrillation: [place] vas 2 score of at least 3.  Rate controlled with metoprolol-, resume Eliquis.  Follows with Dr. Rennis GoldenHilty outpatient.  CAD status post CABG: No acute issues.  Continue beta-blocker.  Chronic diastolic heart failure: Euvolemic on exam-follow closely  CKD stage IV: Creatinine close to usual baseline.  Follow.  Dyslipidemia: Continue statin.  BPH: Continue Flomax.  OSA.  Uses CPAP at night.    DVT Prophylaxis: SCD's  Code Status: Full code   Family Communication: Son bedside  Disposition  Plan: Remain inpatient  Antimicrobial agents: Anti-infectives (From admission, onward)   None      Procedures: None  CONSULTS:  GI  Time spent: 25- minutes-Greater than 50% of this time was spent in counseling, explanation of diagnosis, planning of further management, and coordination of care.  MEDICATIONS: Scheduled Meds: . [MAR Hold] atorvastatin  20 mg Oral QHS  . [MAR Hold] docusate sodium  100 mg Oral QAC supper  . [MAR Hold] ferrous sulfate  325 mg Oral QHS  . [MAR Hold] metoprolol tartrate  25 mg Oral BID  . [MAR Hold] multivitamin with minerals  1 tablet Oral Daily  . pantoprazole  40 mg Oral Daily  . [MAR Hold] tamsulosin  0.4 mg Oral QPM  . [MAR Hold] tobramycin  2 drop Both Eyes Q4H   Continuous Infusions: . [MAR Hold] pantoprazole (PROTONIX) IVPB     PRN Meds:.[MAR Hold] acetaminophen **OR** [MAR Hold] acetaminophen, [MAR Hold] labetalol, [MAR Hold] nitroGLYCERIN, [MAR Hold] ondansetron **OR** [MAR Hold] ondansetron (ZOFRAN) IV   PHYSICAL EXAM: Vital signs: Vitals:   02/10/18 1035 02/10/18 1040 02/10/18 1045 02/10/18 1055  BP: (!) 90/45 121/64 131/75 131/75  Pulse: 98 (!) 50 92 82  Resp: (!) 25 20 18 16   Temp: 98.7 F (37.1 C)     TempSrc: Oral     SpO2: 94% 90% 97% 94%  Weight:      Height:       Filed Weights   02/08/18  1611 02/10/18 0943  Weight: 94.3 kg 94.3 kg   Body mass index is 36.85 kg/m.   Exam  Awake Alert,  No new F.N deficits, Normal affect .AT,PERRAL Supple Neck,No JVD, No cervical lymphadenopathy appriciated.  Symmetrical Chest wall movement, Good air movement bilaterally, CTAB RRR,No Gallops, Rubs or new Murmurs, No Parasternal Heave +ve B.Sounds, Abd Soft, No tenderness, No organomegaly appriciated, No rebound - guarding or rigidity. No Cyanosis, Clubbing or edema, No new Rash or bruise   I have personally reviewed following labs and imaging studies  LABORATORY DATA: CBC: Recent Labs  Lab 02/07/18 1555  02/08/18 1642 02/09/18 0442 02/09/18 0752 02/10/18 0356  WBC 6.9 6.0 5.6 5.3 6.6  NEUTROABS 5.0 4.4  --   --   --   HGB 6.8 Repeated and verified X2.* 6.8* 8.0* 8.5* 8.2*  HCT 21.9 Repeated and verified X2.* 23.7* 25.6* 27.9* 27.4*  MCV 90.0 95.2 89.5 89.7 90.1  PLT 299.0 308 288 309 246    Basic Metabolic Panel: Recent Labs  Lab 02/07/18 1555 02/08/18 1642 02/09/18 0442 02/10/18 0356  NA 138 139 138 136  K 4.6 4.1 3.9 3.8  CL 102 103 105 105  CO2 27 23 22 22   GLUCOSE 87 102* 114* 123*  BUN 40* 44* 40* 30*  CREATININE 1.92* 1.98* 1.66* 1.63*  CALCIUM 8.8 9.1 8.6* 8.6*    GFR: Estimated Creatinine Clearance: 29.4 mL/min (A) (by C-G formula based on SCr of 1.63 mg/dL (H)).  Liver Function Tests: Recent Labs  Lab 02/07/18 1555 02/08/18 1642 02/09/18 0442  AST 14 23 17   ALT 17 21 18   ALKPHOS 63 65 54  BILITOT 0.4 0.7 2.0*  PROT 6.2 6.6 5.9*  ALBUMIN 3.4* 3.2* 2.8*   No results for input(s): LIPASE, AMYLASE in the last 168 hours. No results for input(s): AMMONIA in the last 168 hours.  Coagulation Profile: Recent Labs  Lab 02/08/18 1642  INR 1.26    Cardiac Enzymes: No results for input(s): CKTOTAL, CKMB, CKMBINDEX, TROPONINI in the last 168 hours.  BNP (last 3 results) Recent Labs    07/28/17 0940 10/06/17 1120  PROBNP 1,469* 211.0*    HbA1C: No results for input(s): HGBA1C in the last 72 hours.  CBG: No results for input(s): GLUCAP in the last 168 hours.  Lipid Profile: No results for input(s): CHOL, HDL, LDLCALC, TRIG, CHOLHDL, LDLDIRECT in the last 72 hours.  Thyroid Function Tests: No results for input(s): TSH, T4TOTAL, FREET4, T3FREE, THYROIDAB in the last 72 hours.  Anemia Panel: Recent Labs    02/07/18 1557  FERRITIN 26  TIBC 346  IRON 11*    Urine analysis:    Component Value Date/Time   COLORURINE YELLOW 02/09/2018 2030   APPEARANCEUR CLEAR 02/09/2018 2030   LABSPEC 1.014 02/09/2018 2030   PHURINE 6.0 02/09/2018 2030    GLUCOSEU NEGATIVE 02/09/2018 2030   HGBUR NEGATIVE 02/09/2018 2030   HGBUR negative 09/03/2008 1110   BILIRUBINUR NEGATIVE 02/09/2018 2030   BILIRUBINUR n 12/18/2017 1618   KETONESUR NEGATIVE 02/09/2018 2030   PROTEINUR NEGATIVE 02/09/2018 2030   UROBILINOGEN 0.2 12/18/2017 1618   UROBILINOGEN 0.2 12/17/2010 1123   NITRITE NEGATIVE 02/09/2018 2030   LEUKOCYTESUR NEGATIVE 02/09/2018 2030    Sepsis Labs: Lactic Acid, Venous    Component Value Date/Time   LATICACIDVEN 1.4 02/03/2015 1457    MICROBIOLOGY: No results found for this or any previous visit (from the past 240 hour(s)).  RADIOLOGY STUDIES/RESULTS: Vas [place] Evar Duplex  Result Date:  02/08/2018 Endovascular Aortic Repair Study (EVAR) Indications: Follow up exam for EVAR. Surgery date 2012.  Performing Technologist: Elita QuickSusan Skeel RVT  Examination Guidelines: A complete evaluation includes B-mode imaging, spectral Doppler, color Doppler, and power Doppler as needed of all accessible portions of each vessel. Bilateral testing is considered an integral part of a complete examination. Limited examinations for reoccurring indications may be performed as noted.  Abdominal Aorta Findings: +------------+-------+----------+----------+---------+--------+--------+ Location    AP (cm)Trans (cm)PSV (cm/s)Waveform ThrombusComments +------------+-------+----------+----------+---------+--------+--------+ Distal      6.06   6.03      114       triphasic                 +------------+-------+----------+----------+---------+--------+--------+ RT limb Prox1.2    1.2       114       triphasic                 +------------+-------+----------+----------+---------+--------+--------+ LT limb Prox1.1    1.1       -         -                         +------------+-------+----------+----------+---------+--------+--------+  Summary: IVC/Iliac: Patent stent of the abdominal aorta with a maximum native aortic measurement of 6.06 x  6.03.  *See table(s) above for measurements and observations.  Electronically signed by Fabienne Brunsharles Fields MD on 02/08/2018 at 1:44:14 PM.   Final      LOS: 2 days   Susa RaringPrashant Marasia Newhall, MD  [facility]  If 7PM-7AM, please contact night-coverage  Please page via [email or web address] [email or web address]-click on MD name and type text message  02/10/2018, 10:58 AM

## 2018-02-11 ENCOUNTER — Encounter (HOSPITAL_COMMUNITY): Payer: Self-pay | Admitting: Gastroenterology

## 2018-02-11 LAB — CBC
HCT: 30.6 % — ABNORMAL LOW (ref 39.0–52.0)
Hemoglobin: 9.2 g/dL — ABNORMAL LOW (ref 13.0–17.0)
MCH: 28.2 pg (ref 26.0–34.0)
MCHC: 30.1 g/dL (ref 30.0–36.0)
MCV: 93.9 fL (ref 80.0–100.0)
PLATELETS: 274 10*3/uL (ref 150–400)
RBC: 3.26 MIL/uL — ABNORMAL LOW (ref 4.22–5.81)
RDW: 16.2 % — ABNORMAL HIGH (ref 11.5–15.5)
WBC: 8.3 10*3/uL (ref 4.0–10.5)
nRBC: 0 % (ref 0.0–0.2)

## 2018-02-11 LAB — BASIC METABOLIC PANEL
Anion gap: 11 (ref 5–15)
BUN: 28 mg/dL — ABNORMAL HIGH (ref 8–23)
CALCIUM: 8.6 mg/dL — AB (ref 8.9–10.3)
CO2: 22 mmol/L (ref 22–32)
Chloride: 104 mmol/L (ref 98–111)
Creatinine, Ser: 1.84 mg/dL — ABNORMAL HIGH (ref 0.61–1.24)
GFR calc Af Amer: 36 mL/min — ABNORMAL LOW (ref >60)
GFR calc non Af Amer: 31 mL/min — ABNORMAL LOW (ref >60)
Glucose, Bld: 129 mg/dL — ABNORMAL HIGH (ref 70–99)
Potassium: 3.8 mmol/L (ref 3.5–5.1)
Sodium: 137 mmol/L (ref 135–145)

## 2018-02-11 MED ORDER — APIXABAN 2.5 MG PO TABS: 2.5000 mg | ORAL_TABLET | Freq: Two times a day (BID) | ORAL | 0 refills | Status: DC

## 2018-02-11 MED ORDER — METOPROLOL TARTRATE 5 MG/5ML IV SOLN
5.0000 mg | Freq: Once | INTRAVENOUS | Status: AC
  Administered 2018-02-11: 5 mg via INTRAVENOUS
  Filled 2018-02-11: qty 5

## 2018-02-11 MED ORDER — METOPROLOL TARTRATE 25 MG PO TABS
25.0000 mg | ORAL_TABLET | Freq: Once | ORAL | Status: AC
  Administered 2018-02-11: 25 mg via ORAL
  Filled 2018-02-11: qty 1

## 2018-02-11 NOTE — Discharge Summary (Signed)
Corey Mcdonald AVW:[id number] DOB: Mar 06, 1923 DOA: 02/08/2018  PCP: Corey Mcdonald, [name], NP  Admit date: 02/08/2018  Discharge date: 02/11/2018  Admitted From: Home   Disposition:  Home   Recommendations for Outpatient Follow-up:   Follow up with PCP in 1-2 weeks  PCP Please obtain BMP/CBC, 2 view CXR in 1week,  (see Discharge instructions)   PCP Please follow up on the following pending results:    Home Health: PT,RN   Equipment/Devices: None  Consultations: GI- Buccini Discharge Condition: Stable   CODE STATUS: Full   Diet Recommendation: Heart Healthy      Chief Complaint  Patient presents with  . Abnormal Lab     Brief history of present illness from the day of admission and additional interim summary    Patient is a 84 y.o. male prior history of GI bleeding in 2016 (underwent EGD/colonoscopy-showed hiatal hernia and sigmoid diverticulosis / hemorrhoids) A. fib on Eliquis-sent by PCP for evaluation of symptomatic anemia, found to have a hemoglobin of 6.9 with FOBT positive stools without any overt GI bleeding.  Admitted and given 2 units of PRBC.  GI consultation in progress.  See below for further details                                                                 Hospital Course   Anemia: Which appears iron deficiency, likely due to chronic occult GI blood loss, this was found incidentally due to routine blood work with heme positive stool.  Status post units of packed RBC transfusion on 02/08/2018 with stable posttransfusion H&H.  Seen by GI underwent EGD showing Schatzki ring no signs of ongoing bleeding.  Per GI okay to resume Eliquis and PPI.  No further GI work-up.  Due to his advanced age intermittent 1 to 2 units of blood transfusion in 1 to 2 years is an acceptable risk with ongoing Eliquis use.   Of note his Eliquis dose has been reduced based on age by pharmacy.  Morning he is symptom-free having breakfast with daughter eager to be discharged back to his living arrangement which appears to be independent living.  He gets good care there.  PT will eval, will order home PT and RN as well.  He feels back to baseline sitting in recliner symptom-free and wants to be discharged.    Chronic atrial fibrillation: [place] vas 2 score of at least 3.  Rate controlled with metoprolol-, resume Eliquis.  Follows with Dr. Rennis GoldenHilty outpatient.  CAD status post CABG: No acute issues.  Continue beta-blocker.  Chronic diastolic heart failure: Euvolemic on exam-follow closely  CKD stage IV: Creatinine close to usual baseline.  Follow.  Dyslipidemia: Continue statin.  BPH: Continue Flomax.  OSA.  Uses CPAP at night.  He is compliant with it.    Discharge  diagnosis     Principal Problem:   Symptomatic anemia Active Problems:   Dyslipidemia   Essential hypertension   Atrial fibrillation (HCC)   PEPTIC ULCER DISEASE, HX OF   UGI bleed   CAD in native artery   AAA (abdominal aortic aneurysm) without rupture [facility])    Discharge instructions    Discharge Instructions    Diet - low sodium heart healthy   Complete by:  As directed    Discharge instructions   Complete by:  As directed    Follow with Primary MD Corey Frees, NP in 7 days   Get CBC, CMP,  checked  by Primary MD or SNF MD in 5-7 days    Activity: As tolerated with Full fall precautions use walker/cane & assistance as needed  Disposition Home    Diet: Heart Healthy     Special Instructions: If you have smoked or chewed Tobacco  in the last 2 yrs please stop smoking, stop any regular Alcohol  and or any Recreational drug use.  On your next visit with your primary care physician please Get Medicines reviewed and adjusted.  Please request your Prim.MD to go over all Hospital Tests and Procedure/Radiological results at  the follow up, please get all Hospital records sent to your Prim MD by signing hospital release before you go home.  If you experience worsening of your admission symptoms, develop shortness of breath, life threatening emergency, suicidal or homicidal thoughts you must seek medical attention immediately by calling 911 or calling your MD immediately  if symptoms less severe.  You Must read complete instructions/literature along with all the possible adverse reactions/side effects for all the Medicines you take and that have been prescribed to you. Take any new Medicines after you have completely understood and accpet all the possible adverse reactions/side effects.   Increase activity slowly   Complete by:  As directed       Discharge Medications   Allergies as of 02/11/2018      Reactions   Cilostazol Other (See Comments)   Adverse reaction per patient - leg cramps   Doxazosin Other (See Comments)   Leg cramps      Medication List    TAKE these medications   acetaminophen 325 MG tablet Commonly known as:  TYLENOL Take 650 mg by mouth every 6 (six) hours as needed for moderate pain.   apixaban 2.5 MG Tabs tablet Commonly known as:  ELIQUIS Take 1 tablet (2.5 mg total) by mouth 2 (two) times daily. What changed:    medication strength  how much to take   atorvastatin 20 MG tablet Commonly known as:  LIPITOR Take 1 tablet (20 mg total) by mouth daily. What changed:  when to take this   docusate sodium 100 MG capsule Commonly known as:  COLACE Take 100 mg by mouth daily before supper.   furosemide 20 MG tablet Commonly known as:  LASIX Take 40mg  by mouth in the morning and 20mg  in the afternoon. What changed:    how much to take  how to take this  when to take this  additional instructions   Iron 240 (27 Fe) MG Tabs Take 1 tablet by mouth at bedtime.   metoprolol tartrate 25 MG tablet Commonly known as:  LOPRESSOR TAKE 1 TABLET BY MOUTH TWICE DAILY     multivitamin with minerals Tabs tablet Take 1 tablet by mouth daily.   nitroGLYCERIN 0.4 MG SL tablet Commonly known as:  NITROSTAT Place 1 tablet (  0.4 mg total) under the tongue every 5 (five) minutes as needed. FOR CHEST PAIN What changed:    reasons to take this  additional instructions   pantoprazole 40 MG tablet Commonly known as:  PROTONIX TAKE 1 TABLET BY MOUTH ONCE DAILY What changed:  when to take this   PRESCRIPTION MEDICATION Inhale into the lungs See admin instructions. CPAP (pressure is set at 10): Use at bedtime and during all naps   tamsulosin 0.4 MG Caps capsule Commonly known as:  FLOMAX TAKE 1 CAPSULE BY MOUTH ONCE DAILY IN THE EVENING. What changed:    how much to take  how to take this  when to take this  additional instructions   tobramycin 0.3 % ophthalmic solution Commonly known as:  TOBREX Place 2 drops into both eyes every 4 (four) hours.       Follow-up Information    Corey Mcdonald, [name], NP. Schedule an appointment as soon as possible for a visit in 1 week(s).   Specialty:  Family Medicine Contact information: [address] [place] [address] [phone]        Bernette RedbirdBuccini, Robert, MD. Schedule an appointment as soon as possible for a visit in 1 week(s).   Specialty:  Gastroenterology Contact information: [address] [place] [address] [phone]           Major procedures and Radiology Reports - PLEASE review detailed and final reports thoroughly  -     EGD  Impression:  The examined duodenum was normal, including careful inspection of the bulb. - Normal larynx. - Moderate hiatal hernia with moderate Schatzki ring. - Normal stomach. - Normal examined duodenum. - No specimens collected.    Recommendation:  - Continue present medications. - OK to resume Eliquis (apixaban) at prior dose today, at discretion of attending physician. - In view of patient's age and fairly recently  negative colonoscopy, and the low likelihood of finding treatable pathology on a capsule endoscopy, I would not favor further GI workup in this patient, but instead would advocate aggressive iron supplementation, by IV route if necessary, with the hope that the patient can keep up with his occult blood loss.    ---    Vas [place] Evar Duplex  Result Date: 02/08/2018 Endovascular Aortic Repair Study (EVAR) Indications: Follow up exam for EVAR. Surgery date 2012.  Performing Technologist: Elita QuickSusan Skeel RVT  Examination Guidelines: A complete evaluation includes B-mode imaging, spectral Doppler, color Doppler, and power Doppler as needed of all accessible portions of each vessel. Bilateral testing is considered an integral part of a complete examination. Limited examinations for reoccurring indications may be performed as noted.  Abdominal Aorta Findings: +------------+-------+----------+----------+---------+--------+--------+ Location    AP (cm)Trans (cm)PSV (cm/s)Waveform ThrombusComments +------------+-------+----------+----------+---------+--------+--------+ Distal      6.06   6.03      114       triphasic                 +------------+-------+----------+----------+---------+--------+--------+ RT limb Prox1.2    1.2       114       triphasic                 +------------+-------+----------+----------+---------+--------+--------+ LT limb Prox1.1    1.1       -         -                         +------------+-------+----------+----------+---------+--------+--------+  Summary: IVC/Iliac:  Patent stent of the abdominal aorta with a maximum native aortic measurement of 6.06 x 6.03.  *See table(s) above for measurements and observations.  Electronically signed by Fabienne Bruns MD on 02/08/2018 at 1:44:14 PM.   Final     Micro Results     No results found for this or any previous visit (from the past 240 hour(s)).  Today   Subjective    Jayke Caul today has no  headache,no chest abdominal pain,no new weakness tingling or numbness, feels much better wants to go home today.    Objective   Blood pressure (!) 139/106, pulse 96, temperature 98 F (36.7 C), temperature source Oral, resp. rate 18, height 5\' 3"  (1.6 m), weight 94.3 kg, SpO2 90 %.   Intake/Output Summary (Last 24 hours) at 02/11/2018 1016 Last data filed at 02/11/2018 0457 Gross per 24 hour  Intake 400 ml  Output 500 ml  Net -100 ml    Exam Awake Alert, Oriented x 3, No new F.N deficits, Normal affect [place].AT,PERRAL Supple Neck,No JVD, No cervical lymphadenopathy appriciated.  Symmetrical Chest wall movement, Good air movement bilaterally, CTAB iRRR,No Gallops,Rubs or new Murmurs, No Parasternal Heave +ve B.Sounds, Abd Soft, Non tender, No organomegaly appriciated, No rebound -guarding or rigidity. No Cyanosis, Clubbing or edema, No new Rash or bruise   Data Review   CBC w Diff:  Lab Results  Component Value Date   WBC 8.3 02/11/2018   HGB 9.2 (L) 02/11/2018   HCT 30.6 (L) 02/11/2018   HCT 27.3 (L) 01/27/2015   PLT 274 02/11/2018   LYMPHOPCT 12 02/08/2018   MONOPCT 12 02/08/2018   EOSPCT 3 02/08/2018   BASOPCT 1 02/08/2018    CMP:  Lab Results  Component Value Date   NA 137 02/11/2018   NA 138 07/28/2017   K 3.8 02/11/2018   CL 104 02/11/2018   CO2 22 02/11/2018   BUN 28 (H) 02/11/2018   BUN 26 07/28/2017   CREATININE 1.84 (H) 02/11/2018   CREATININE 1.27 (H) 05/08/2015   PROT 5.9 (L) 02/09/2018   ALBUMIN 2.8 (L) 02/09/2018   BILITOT 2.0 (H) 02/09/2018   ALKPHOS 54 02/09/2018   AST 17 02/09/2018   ALT 18 02/09/2018  .   Total Time in preparing paper work, data evaluation and todays exam - 35 minutes  Susa Raring M.D on 02/11/2018 at 10:16 AM  [facility]   Office  [phone]

## 2018-02-11 NOTE — Clinical Social Work Note (Signed)
Clinical Social Work Assessment  Patient Details  Name: Corey Mcdonald MRN: [id number] Date of Birth: Mar 03, 1923  Date of referral:  02/11/18               Reason for consult:  Discharge Planning                Permission sought to share information with:  Case Manager, Facility Medical sales representativeContact Representative, Family Supports Permission granted to share information::  Yes, Verbal Permission Granted  Name::     Corey Mcdonald  Agency::  SNFs  Relationship::  daughter  Contact Information:  [phone]  Housing/Transportation Living arrangements for the past 2 months:  Independent Living Facility([facility]) Source of Information:  Adult Children Patient Interpreter Needed:  None Criminal Activity/Legal Involvement Pertinent to Current Situation/Hospitalization:  No - Comment as needed Significant Relationships:  Adult Children Lives with:  Self Do you feel safe going back to the place where you live?  No Need for family participation in patient care:  Yes (Comment)  Care giving concerns:  CSW received referral for possible SNF placement at time of discharge. Spoke with patient's daughter Corey Mcdonald regarding possibility of SNF placement due to patient's lack of full orientation . Patient's family  is currently unable to care for him at their home given patient's current needs and fall risk.  Patient's daughter Corey Mcdonald  expressed understanding of PT recommendation and are agreeable to SNF placement at time of discharge. CSW to continue to follow and assist with discharge planning needs.     Social Worker assessment / plan:  Spoke with patient's daughter Corey Mcdonald  concerning possibility of rehab at SNF before returning home. Corey Mcdonald reports [facility] side, would like patient to transition to Skilled Nursing side of [facility].   Employment status:  Retired Database administratornsurance information:  Managed Medicare PT Recommendations:  Skilled Holiday representativeursing Facility, 24 Hour Supervision Information /  Referral to community resources:  Skilled Nursing Facility  Patient/Family's Response to care:  Patient and  Family recognize need for rehab before returning home and are agreeable to a SNF and report preference [facility]  . CSW explained insurance authorization process. Patient's family reported that they want patient to get stronger to be able to come back home.    Patient/Family's Understanding of and Emotional Response to Diagnosis, Current Treatment, and Prognosis:  Patient/family is realistic regarding therapy needs and expressed being hopeful for SNF placement. Patient expressed understanding of CSW role and discharge process as well as medical condition. No questions/concerns about plan or treatment.    Emotional Assessment Appearance:  Appears stated age Attitude/Demeanor/Rapport:  Unable to Assess Affect (typically observed):  Unable to Assess Orientation:  Oriented to Self, Oriented to Place, Oriented to Situation Alcohol / Substance use:  Not Applicable Psych involvement (Current and /or in the community):  No (Comment)  Discharge Needs  Concerns to be addressed:  Discharge Planning Concerns Readmission within the last 30 days:  No Current discharge risk:  Dependent with Mobility Barriers to Discharge:  Continued Medical Work up   Dynegyshley M Noami Bove, LCSW 02/11/2018, 8:53 AM

## 2018-02-11 NOTE — Care Management Note (Signed)
Case Management Note  Patient Details  Name: Agustina CaroliCarl Hennigan MRN: [id number] Date of Birth: 27-Aug-1923  Subjective/Objective:                    Action/Plan:  Spoke w patient and daughter at bedside. They wish to return to ILF. They have caregiver and [facility] as they have used them in the past. They decline need for any DME. Also provided with 30 day coupon for Eliquis.  No other CM needs.  Expected Discharge Date:  02/11/18               Expected Discharge Plan:  Home w Home Health Services  In-House Referral:     Discharge planning Services  CM Consult  Post Acute Care Choice:  Home Health Choice offered to:  Adult Children  DME Arranged:    DME Agency:     HH Arranged:  RN, PT HH Agency:  [facility]  Status of Service:  Completed, signed off  If discussed at Long Length of Stay Meetings, dates discussed:    Additional Comments:  Lawerance SabalDebbie Roshaun Pound, RN 02/11/2018, 12:03 PM

## 2018-02-11 NOTE — Progress Notes (Signed)
Physical Therapy Treatment Patient Details Name: Corey Mcdonald MRN: [id number] DOB: March 04, 1923 Today's Date: 02/11/2018    History of Present Illness Pt is a 84 y.o. male admitted from ILF on 02/08/18 with weakness and low hemoglobin. Worked up for symptomatic anemia with heme positive stool without overt bleeding on Eliquis. Plan for EGD with propofol on 12/21. PMH includes HTN, macular degeneration, legally blind, PVD, CAD, AAA.    PT Comments    On arrival, pt lying in wet bed and RN in to provide IV meds for afib. Agreed to attempt PT (and at least OOB to chair) due to pt's bed wet. Overall, pt most dizzy with side to sit, however SBP dropped 165 to 139, MAP dropped 113 to 100 and HR incr 90 to 122 bpm (pt wearing small cuff as unit does not have a regular size cuff, therefore all BP readings high, but trend was to decrease).   Patient settled in chair with breakfast arriving and family present. Will give pt time OOB and time for meds to fully take effect and will re-attempt to ambulate.      Follow Up Recommendations  SNF;Supervision/Assistance - 24 hour(will try again after breakfast and see if can return to ILF )     Equipment Recommendations  None recommended by PT    Recommendations for Other Services       Precautions / Restrictions Precautions Precautions: Fall;Other (comment)(incr HR due to afib)    Mobility  Bed Mobility Overal bed mobility: Modified Independent Bed Mobility: Rolling;Sidelying to Sit Rolling: Modified independent (Device/Increase time) Sidelying to sit: Supervision       General bed mobility comments: attempted with no bedrail (dtr reports no rail at ILF) and required min assist; with rail (pt insists he has one) supervision only because of dizziness  Transfers Overall transfer level: Needs assistance Equipment used: Rolling walker (2 wheeled) Transfers: Sit to/from UGI CorporationStand;Stand Pivot Transfers Sit to Stand: Supervision Stand pivot transfers:  Min guard       General transfer comment: stood twice to assess if BP improved (dropping) with HR increasing to 122 bpm (standing); vc for safe use of RW  Ambulation/Gait             General Gait Details: unable due to dizziness and orthostasis with incr HR 122   Stairs             Wheelchair Mobility    Modified Rankin (Stroke Patients Only)       Balance                                            Cognition Arousal/Alertness: Awake/alert Behavior During Therapy: WFL for tasks assessed/performed Overall Cognitive Status: History of cognitive impairments - at baseline                         Following Commands: Follows one step commands with increased time              Exercises      General Comments        Pertinent Vitals/Pain Pain Assessment: No/denies pain    Home Living                      Prior Function            PT Goals (current goals  can now be found in the care plan section) Acute Rehab PT Goals Patient Stated Goal: Get stronger so he can return to ILF Time For Goal Achievement: 02/23/18 Potential to Achieve Goals: Good Progress towards PT goals: Not progressing toward goals - comment(due to BP and HR issues; RN gave Cardizem just prior to OOB)    Frequency    Min 2X/week      PT Plan Current plan remains appropriate    Co-evaluation              AM-PAC PT "6 Clicks" Mobility   Outcome Measure  Help needed turning from your back to your side while in a flat bed without using bedrails?: A Little Help needed moving from lying on your back to sitting on the side of a flat bed without using bedrails?: A Little Help needed moving to and from a bed to a chair (including a wheelchair)?: A Little Help needed standing up from a chair using your arms (e.g., wheelchair or bedside chair)?: None Help needed to walk in hospital room?: Total Help needed climbing 3-5 steps with a railing?  : Total 6 Click Score: 15    End of Session Equipment Utilized During Treatment: Gait belt Activity Tolerance: Treatment limited secondary to medical complications (Comment) Patient left: in chair;with family/visitor present(HR, pulse ox, and BP monitors in place) Nurse Communication: Other (comment)(decr BP and incr HR) PT Visit Diagnosis: Other abnormalities of gait and mobility (R26.89);Unsteadiness on feet (R26.81)     Time: [id number] PT Time Calculation (min) (ACUTE ONLY): 35 min  Charges:  $Therapeutic Activity: 23-37 mins                        Scherrie NovemberLynn P BellevilleSasser, South CarolinaPT   [phone] 02/11/2018, 9:11 AM

## 2018-02-11 NOTE — [place] FL2 (Signed)
[place] MEDICAID FL2 LEVEL OF CARE SCREENING TOOL     IDENTIFICATION  Patient Name: Corey CaroliCarl Schwegler Birthdate: 09-May-1923 Sex: male Admission Date (Current Location): 02/08/2018  [facility] [place]   Facility and Address:  [facility], [address], [place], [place] [address]      Provider Number: [id number]  Attending Physician Name and Address:  Leroy SeaSingh, Prashant K, MD  Relative Name and Phone Number:  Velna OchsScott, Amy [phone]    Current Level of Care: Hospital Recommended Level of Care: Skilled Nursing Facility Prior Approval Number:    Date Approved/Denied:   PASRR Number:    Discharge Plan: SNF    Current Diagnoses: Patient Active Problem List   Diagnosis Date Noted  . Symptomatic anemia 02/08/2018  . Aortic valve stenosis 07/28/2017  . AAA (abdominal aortic aneurysm) without rupture (HCC) 10/21/2016  . Acute on chronic combined systolic and diastolic CHF (congestive heart failure) (HCC) 01/22/2016  . TIA (transient ischemic attack) 01/09/2016  . Acute on chronic renal failure (HCC)   . CAD in native artery   . Chronic atrial fibrillation   . Diastolic heart failure (HCC)   . Acute renal failure (HCC)   . UGI bleed 02/02/2015  . GI bleeding 01/27/2015  . Acute blood loss anemia 01/27/2015  . Chronic kidney disease 01/27/2015  . Pedal edema 01/21/2015  . DOE (dyspnea on exertion) 12/19/2014  . Claudication (HCC) 09/28/2012  . S/P CABG x 4 09/28/2012  . Dyslipidemia 12/04/2009  . RESTLESS LEGS SYNDROME 12/04/2009  . History of endovascular stent graft for abdominal aortic aneurysm 09/15/2008  . VERTIGO 07/28/2008  . Atrial fibrillation (HCC) 10/10/2006  . MACULAR DEGENERATION 08/18/2006  . Essential hypertension 08/18/2006  . Coronary atherosclerosis 08/18/2006  . ATRIAL FLUTTER 08/18/2006  . OSA on CPAP 08/18/2006  . PEPTIC ULCER DISEASE, HX OF 08/18/2006  . COLONIC POLYPS, HX OF 08/18/2006  . BPH (benign  prostatic hyperplasia) 08/18/2006    Orientation RESPIRATION BLADDER Height & Weight     Self, Situation, Place    Continent Weight: 208 lb (94.3 kg) Height:  5\' 3"  (160 cm)  BEHAVIORAL SYMPTOMS/MOOD NEUROLOGICAL BOWEL NUTRITION STATUS      Continent (Heart Diet Thin Fluids)  AMBULATORY STATUS COMMUNICATION OF NEEDS Skin   Limited Assist Verbally Normal                       Personal Care Assistance Level of Assistance  Bathing, Feeding, Dressing Bathing Assistance: Maximum assistance Feeding assistance: Maximum assistance Dressing Assistance: Maximum assistance     Functional Limitations Info  Sight, Hearing, Speech Sight Info: Impaired Hearing Info: Adequate Speech Info: Adequate    SPECIAL CARE FACTORS FREQUENCY  PT (By licensed PT), OT (By licensed OT)     PT Frequency: 2x OT Frequency: 2x            Contractures Contractures Info: Not present    Additional Factors Info  (Full code)               Current Medications (02/11/2018):  This is the current hospital active medication list Current Facility-Administered Medications  Medication Dose Route Frequency Provider Last Rate Last Dose  . acetaminophen (TYLENOL) tablet 650 mg  650 mg Oral Q6H PRN Buccini, Robert, MD      . apixaban Everlene Balls(ELIQUIS) tablet 2.5 mg  2.5 mg Oral BID Lodema Hongierce, Dwayne A, RPH   2.5 mg at 02/11/18 [id number]  . atorvastatin (LIPITOR) tablet  20 mg  20 mg Oral QHS Bernette RedbirdBuccini, Robert, MD   20 mg at 02/10/18 2106  . docusate sodium (COLACE) capsule 100 mg  100 mg Oral QAC supper Bernette RedbirdBuccini, Robert, MD   100 mg at 02/10/18 1636  . ferrous sulfate tablet 325 mg  325 mg Oral QHS Bernette RedbirdBuccini, Robert, MD   325 mg at 02/10/18 2106  . metoprolol tartrate (LOPRESSOR) tablet 25 mg  25 mg Oral BID Bernette RedbirdBuccini, Robert, MD   25 mg at 02/10/18 2106  . multivitamin with minerals tablet 1 tablet  1 tablet Oral Daily Buccini, Robert, MD   1 tablet at 02/11/18 0826  . nitroGLYCERIN (NITROSTAT) SL tablet 0.4 mg  0.4 mg  Sublingual Q5 min PRN Buccini, Robert, MD      . ondansetron (ZOFRAN) injection 4 mg  4 mg Intravenous Q6H PRN Buccini, Robert, MD      . pantoprazole (PROTONIX) EC tablet 40 mg  40 mg Oral Daily Buccini, Molly Maduroobert, MD   40 mg at 02/11/18 0826  . tamsulosin (FLOMAX) capsule 0.4 mg  0.4 mg Oral QPM Buccini, Robert, MD   0.4 mg at 02/10/18 1636  . tobramycin (TOBREX) 0.3 % ophthalmic solution 2 drop  2 drop Both Eyes Q4H Buccini, Molly Maduroobert, MD   2 drop at 02/11/18 0827     Discharge Medications: Please see discharge summary for a list of discharge medications.  Relevant Imaging Results:  Relevant Lab Results:   Additional Information SN: [id number]  Mamie NickMahamadu Brendolyn Stockley, LCSW

## 2018-02-11 NOTE — Progress Notes (Signed)
Physical Therapy Treatment  Clinical Impression: Per MD, wanted pt walked as long as not symptomatic with drop in BP or HR incr; sit BP 150/90 stand 128/69 after 3 min 145/66; HR pre-ambulation 109, after 144 and returned to 110 in ~1.5 minutes +dyspnea. Daughter present and reports pt's son is coming from Out-of-town and plans to stay with pt at his ILF. He can take all meals in his room if not yet up to walking to dining room. OK with return to ILF from a mobility stand-point. Recommended pt continue to use RW at all times. Pt could benefit from HHPT to assist with pt being able to walk to the dining room at his facility.     02/11/18 1031  PT Visit Information  Last PT Received On 02/11/18  Assistance Needed +1  History of Present Illness Pt is a 84 y.o. male admitted from ILF on 02/08/18 with weakness and low hemoglobin. Worked up for symptomatic anemia with heme positive stool without overt bleeding on Eliquis. Plan for EGD with propofol on 12/21. PMH includes HTN, macular degeneration, legally blind, PVD, CAD, AAA.  Subjective Data  Patient Stated Goal Get stronger so he can return to ILF  Precautions  Precautions Fall;Other (comment) (incr HR due to afib)  Pain Assessment  Pain Assessment No/denies pain  Cognition  Arousal/Alertness Awake/alert  Behavior During Therapy WFL for tasks assessed/performed  Overall Cognitive Status History of cognitive impairments - at baseline  Following Commands Follows one step commands with increased time  Transfers  Overall transfer level Needs assistance  Equipment used Rolling walker (2 wheeled)  Transfers Sit to/from BJ'sStand;Stand Pivot Transfers  Sit to Stand Supervision  General transfer comment Stood twice--pull up pants and prior to walk; denied dizziness  Ambulation/Gait  Ambulation/Gait assistance Min Psychologist, occupationalguard;Supervision  Gait Distance (Feet) 80 Feet  Assistive device Rolling walker (2 wheeled)  Gait Pattern/deviations Step-through  pattern;Decreased stride length;Trunk flexed  General Gait Details Per MD, wanted pt walked as long as not symptomatic with drop in BP or HR incr; sit BP 150/90 stand 128/69 after 3 min 145/66; HR pre-ambulation 109, after 144 and returned to 110 in ~1.5 minutes +dyspnea  General Comments  General comments (skin integrity, edema, etc.) Daughter present and reports pt's son is coming from Out-of-town and plans to stay with pt at his ILF. He can take all meals in his room if not yet up to walking to dining room.   PT - End of Session  Equipment Utilized During Treatment Gait belt  Activity Tolerance Treatment limited secondary to medical complications (Comment) (HR up to 144 with 80 ft of walking)  Patient left in chair;with family/visitor present (HR, pulse ox, and BP monitors in place)  Nurse Communication Other (comment);Mobility status (decr BP and incr HR)   PT - Assessment/Plan  PT Plan Discharge plan needs to be updated  PT Visit Diagnosis Other abnormalities of gait and mobility (R26.89);Unsteadiness on feet (R26.81)  PT Frequency (ACUTE ONLY) Min 2X/week  Follow Up Recommendations Supervision/Assistance - 24 hour;Home health PT (HHPT at his ILF)  PT equipment None recommended by PT  AM-PAC PT "6 Clicks" Mobility Outcome Measure (Version 2)  Help needed turning from your back to your side while in a flat bed without using bedrails? 3  Help needed moving from lying on your back to sitting on the side of a flat bed without using bedrails? 3  Help needed moving to and from a bed to a chair (including a wheelchair)? 3  Help needed standing up from a chair using your arms (e.g., wheelchair or bedside chair)? 4  Help needed to walk in hospital room? 3  Help needed climbing 3-5 steps with a railing?  2  6 Click Score 18  Consider Recommendation of Discharge [facility]  PT Goal Progression  Progress towards PT goals Progressing toward goals  Acute Rehab PT Goals  Time For Goal  Achievement 02/23/18  Potential to Achieve Goals Good  PT Time Calculation  PT Start Time (ACUTE ONLY) 1008  PT Stop Time (ACUTE ONLY) 1028  PT Time Calculation (min) (ACUTE ONLY) 20 min  PT General Charges  $$ ACUTE PT VISIT 1 Visit  PT Treatments  $Gait Training 8-22 mins   Veda CanningLynn Brandyn Thien, [place] CarolinaPT [phone]

## 2018-02-11 NOTE — Discharge Instructions (Signed)
Follow with Primary MD [name], [name], NP in 7 days   Get CBC, CMP,  checked  by Primary MD or SNF MD in 5-7 days    Activity: As tolerated with Full fall precautions use walker/cane & assistance as needed  Disposition Home    Diet: Heart Healthy     Special Instructions: If you have smoked or chewed Tobacco  in the last 2 yrs please stop smoking, stop any regular Alcohol  and or any Recreational drug use.  On your next visit with your primary care physician please Get Medicines reviewed and adjusted.  Please request your Prim.MD to go over all Hospital Tests and Procedure/Radiological results at the follow up, please get all Hospital records sent to your Prim MD by signing hospital release before you go home.  If you experience worsening of your admission symptoms, develop shortness of breath, life threatening emergency, suicidal or homicidal thoughts you must seek medical attention immediately by calling 911 or calling your MD immediately  if symptoms less severe.  You Must read complete instructions/literature along with all the possible adverse reactions/side effects for all the Medicines you take and that have been prescribed to you. Take any new Medicines after you have completely understood and accpet all the possible adverse reactions/side effects.    Information on my medicine - ELIQUIS (apixaban)  This medication education was reviewed with me or my healthcare representative as part of my discharge preparation.   Why was Eliquis prescribed for you? Eliquis was prescribed for you to reduce the risk of a blood clot forming that can cause a stroke if you have a medical condition called atrial fibrillation (a type of irregular heartbeat).  What do You need to know about Eliquis ? Take your Eliquis TWICE DAILY - one tablet in the morning and one tablet in the evening with or without food. If you have difficulty swallowing the tablet whole please discuss with your pharmacist how  to take the medication safely.  Take Eliquis exactly as prescribed by your doctor and DO NOT stop taking Eliquis without talking to the doctor who prescribed the medication.  Stopping may increase your risk of developing a stroke.  Refill your prescription before you run out.  After discharge, you should have regular check-up appointments with your healthcare provider that is prescribing your Eliquis.  In the future your dose may need to be changed if your kidney function or weight changes by a significant amount or as you get older.  What do you do if you miss a dose? If you miss a dose, take it as soon as you remember on the same day and resume taking twice daily.  Do not take more than one dose of ELIQUIS at the same time to make up a missed dose.  Important Safety Information A possible side effect of Eliquis is bleeding. You should call your healthcare provider right away if you experience any of the following: ? Bleeding from an injury or your nose that does not stop. ? Unusual colored urine (red or dark brown) or unusual colored stools (red or black). ? Unusual bruising for unknown reasons. ? A serious fall or if you hit your head (even if there is no bleeding).  Some medicines may interact with Eliquis and might increase your risk of bleeding or clotting while on Eliquis. To help avoid this, consult your healthcare provider or pharmacist prior to using any new prescription or non-prescription medications, including herbals, vitamins, non-steroidal anti-inflammatory drugs (NSAIDs) and  NSAIDs) and supplements. ° °This website has more information on Eliquis® (apixaban): http://www.eliquis.com/eliquis/home °

## 2018-02-11 NOTE — Progress Notes (Signed)
Discharge instructions given to patient and daughter at bedside.  IVs removed without complications. Patient dressed and taken down in wheelchair by NT. Daughter took belonging to the car and will provide transportation home.

## 2018-02-12 ENCOUNTER — Telehealth: Payer: Self-pay | Admitting: *Deleted

## 2018-02-12 NOTE — Telephone Encounter (Signed)
TCM 1st attempt to call patient.

## 2018-02-12 NOTE — Telephone Encounter (Signed)
Patient's daughter returned call.

## 2018-02-13 ENCOUNTER — Telehealth: Payer: Self-pay | Admitting: Internal Medicine

## 2018-02-13 NOTE — Telephone Encounter (Signed)
Left message on machine returning the call. 2nd attempt for TCM

## 2018-02-13 NOTE — Telephone Encounter (Signed)
Returned call to patient's daughter Amy no answer.Unable to leave message no voice mail.

## 2018-02-13 NOTE — Telephone Encounter (Signed)
Eliquis dose based on age, renal function and weight - If decreased, probably was for a good reason. Continue prescribed dose.  Dr. HRexene Edison

## 2018-02-13 NOTE — Telephone Encounter (Signed)
Patient daughter called in

## 2018-02-13 NOTE — Telephone Encounter (Signed)
New Message         Patient's daughter is calling today to see if her father should come in after being discharged from the hospital? Pls call and advise.

## 2018-02-13 NOTE — Telephone Encounter (Signed)
Returned call to patient's daughter Amy.She stated father discharged from hospital.Stated he is doing good.She wanted to schedule appointment while her brother still in town.Post hospital appointment scheduled with Azalee CourseHao Meng PA 02/27/18 at 11:20 am.She wanted to ask Dr.Hilty if ok to continue lower dose Eliquis.She wanted his opinion since dose was decreased in hospital.Message sent to Dr.Hilty.

## 2018-02-15 ENCOUNTER — Other Ambulatory Visit: Payer: Self-pay | Admitting: Adult Health

## 2018-02-15 ENCOUNTER — Other Ambulatory Visit (HOSPITAL_COMMUNITY)
Admission: RE | Admit: 2018-02-15 | Discharge: 2018-02-15 | Disposition: A | Discharge status: Home / Self Care | Payer: Medicare Other | Source: Other Acute Inpatient Hospital | Attending: Family Medicine | Admitting: Family Medicine

## 2018-02-15 ENCOUNTER — Telehealth: Payer: Self-pay

## 2018-02-15 ENCOUNTER — Telehealth: Payer: Self-pay | Admitting: Adult Health

## 2018-02-15 DIAGNOSIS — D509 Iron deficiency anemia, unspecified: Secondary | ICD-10-CM

## 2018-02-15 DIAGNOSIS — K922 Gastrointestinal hemorrhage, unspecified: Secondary | ICD-10-CM | POA: Insufficient documentation

## 2018-02-15 DIAGNOSIS — I2510 Atherosclerotic heart disease of native coronary artery without angina pectoris: Secondary | ICD-10-CM | POA: Diagnosis not present

## 2018-02-15 DIAGNOSIS — K279 Peptic ulcer, site unspecified, unspecified as acute or chronic, without hemorrhage or perforation: Secondary | ICD-10-CM | POA: Diagnosis not present

## 2018-02-15 DIAGNOSIS — I10 Essential (primary) hypertension: Secondary | ICD-10-CM | POA: Diagnosis not present

## 2018-02-15 DIAGNOSIS — H3530 Unspecified macular degeneration: Secondary | ICD-10-CM | POA: Diagnosis not present

## 2018-02-15 DIAGNOSIS — R7989 Other specified abnormal findings of blood chemistry: Secondary | ICD-10-CM | POA: Diagnosis present

## 2018-02-15 DIAGNOSIS — Z87891 Personal history of nicotine dependence: Secondary | ICD-10-CM | POA: Diagnosis not present

## 2018-02-15 DIAGNOSIS — I714 Abdominal aortic aneurysm, without rupture: Secondary | ICD-10-CM | POA: Diagnosis not present

## 2018-02-15 DIAGNOSIS — I482 Chronic atrial fibrillation, unspecified: Secondary | ICD-10-CM | POA: Diagnosis not present

## 2018-02-15 DIAGNOSIS — Z87440 Personal history of urinary (tract) infections: Secondary | ICD-10-CM | POA: Diagnosis not present

## 2018-02-15 DIAGNOSIS — I252 Old myocardial infarction: Secondary | ICD-10-CM | POA: Diagnosis not present

## 2018-02-15 DIAGNOSIS — Z5181 Encounter for therapeutic drug level monitoring: Secondary | ICD-10-CM | POA: Diagnosis not present

## 2018-02-15 DIAGNOSIS — Z951 Presence of aortocoronary bypass graft: Secondary | ICD-10-CM | POA: Diagnosis not present

## 2018-02-15 LAB — BASIC METABOLIC PANEL
Anion gap: 7 (ref 5–15)
BUN: 26 mg/dL — ABNORMAL HIGH (ref 8–23)
CALCIUM: 8.4 mg/dL — AB (ref 8.9–10.3)
CO2: 22 mmol/L (ref 22–32)
Chloride: 106 mmol/L (ref 98–111)
Creatinine, Ser: 1.51 mg/dL — ABNORMAL HIGH (ref 0.61–1.24)
GFR calc Af Amer: 46 mL/min — ABNORMAL LOW (ref >60)
GFR calc non Af Amer: 40 mL/min — ABNORMAL LOW (ref >60)
Glucose, Bld: 91 mg/dL (ref 70–99)
Potassium: 3.9 mmol/L (ref 3.5–5.1)
SODIUM: 135 mmol/L (ref 135–145)

## 2018-02-15 LAB — CBC WITH DIFFERENTIAL/PLATELET
Abs Immature Granulocytes: 0.03 10*3/uL (ref 0.00–0.07)
Basophils Absolute: 0 10*3/uL (ref 0.0–0.1)
Basophils Relative: 0 %
EOS ABS: 0.2 10*3/uL (ref 0.0–0.5)
Eosinophils Relative: 3 %
HCT: 28.6 % — ABNORMAL LOW (ref 39.0–52.0)
Hemoglobin: 8.3 g/dL — ABNORMAL LOW (ref 13.0–17.0)
Immature Granulocytes: 0 %
Lymphocytes Relative: 11 %
Lymphs Abs: 0.7 10*3/uL (ref 0.7–4.0)
MCH: 27.3 pg (ref 26.0–34.0)
MCHC: 29 g/dL — AB (ref 30.0–36.0)
MCV: 94.1 fL (ref 80.0–100.0)
Monocytes Absolute: 0.8 10*3/uL (ref 0.1–1.0)
Monocytes Relative: 11 %
Neutro Abs: 5.2 10*3/uL (ref 1.7–7.7)
Neutrophils Relative %: 75 %
Platelets: 360 10*3/uL (ref 150–400)
RBC: 3.04 MIL/uL — ABNORMAL LOW (ref 4.22–5.81)
RDW: 15.8 % — ABNORMAL HIGH (ref 11.5–15.5)
WBC: 7 10*3/uL (ref 4.0–10.5)
nRBC: 0 % (ref 0.0–0.2)

## 2018-02-15 NOTE — Telephone Encounter (Signed)
Please advise.  Ok for patient to get labs prior to Visit?

## 2018-02-15 NOTE — Telephone Encounter (Signed)
Spoke with Corey Mcdonald, she has been given to ok to draw a cbc with Diff and BMP.

## 2018-02-15 NOTE — Telephone Encounter (Signed)
Copied from CRM #[id number]. Topic: Quick Communication - See Telephone Encounter >> Feb 15, 2018  9:45 AM Richardson, Taren N, NT wrote: CRM for notification. See Telephone encounter for: 02/15/18. Cherryl with [facility] calling and states she is to see the pt this afternoon and the daughter states the pt is due for labs. Cherryl calling to find out which labs are due and to get an order. CB#: [phone] 

## 2018-02-15 NOTE — Telephone Encounter (Signed)
Copied from CRM [id number]. Topic: General - Other >> Feb 13, 2018  2:14 PM Jilda Rocheemaray, Melissa wrote: Reason for CRM: Pts son would like labs drawn prior to his appt on 02/19/18, bare minimum CBC, basic or comprehensive metabolic panel. Please advise  Best call back [phone]

## 2018-02-15 NOTE — Telephone Encounter (Signed)
For some reason he is on Dr. Lucie LeatherBurchette's schedule for tomorrow at 2:40 for hospital follow up. I am unsure of why?   I have a 3 pm open if he can switch.   I am unable to add labs to be drawn if he is seeing another provider

## 2018-02-15 NOTE — Telephone Encounter (Signed)
Please see other encounter from today for documentation

## 2018-02-15 NOTE — Telephone Encounter (Signed)
Copied from CRM [id number]. Topic: Quick Communication - See Telephone Encounter >> Feb 15, 2018  9:45 AM Arlyss Gandyichardson, Roben Schliep N, NT wrote: CRM for notification. See Telephone encounter for: 02/15/18. Cherryl with [facility] calling and states she is to see the pt this afternoon and the daughter states the pt is due for labs. Cherryl calling to find out which labs are due and to get an order. CB#: [phone]

## 2018-02-16 ENCOUNTER — Encounter: Payer: Self-pay | Admitting: Adult Health

## 2018-02-16 ENCOUNTER — Ambulatory Visit: Payer: Medicare Other | Admitting: Family Medicine

## 2018-02-16 ENCOUNTER — Ambulatory Visit: Payer: Medicare Other | Admitting: Adult Health

## 2018-02-16 VITALS — BP 120/68 | HR 97 | Temp 98.1°F | Resp 16 | Ht 63.0 in | Wt 202.0 lb

## 2018-02-16 DIAGNOSIS — D500 Iron deficiency anemia secondary to blood loss (chronic): Secondary | ICD-10-CM | POA: Diagnosis not present

## 2018-02-16 DIAGNOSIS — Z7901 Long term (current) use of anticoagulants: Secondary | ICD-10-CM

## 2018-02-16 NOTE — Patient Instructions (Signed)
It was great seeing you today and I am glad you are improving    Please schedule a lab visit for Jan 2nd.

## 2018-02-16 NOTE — Telephone Encounter (Signed)
Spoke to patient's daughter Amy Dr.Hilty's advice given.

## 2018-02-16 NOTE — Progress Notes (Addendum)
Subjective:    Patient ID: Corey Mcdonald, male    DOB: 05-01-1923, 84 y.o.   MRN: [id number]  HPI   84 year old male who  has a past medical history of AAA (abdominal aortic aneurysm) (HCC), ABDOMINAL AORTIC ANEURYSM, Anemia (January 2012), Atrial fibrillation (HCC), BENIGN PROSTATIC HYPERTROPHY, HX OF ( ), COLONIC POLYPS, HX OF, CORONARY ARTERY DISEASE, History of Doppler ultrasound (05/2011), History of nuclear stress test (2007), HYPERLIPIDEMIA, HYPERTENSION, MACULAR DEGENERATION, Myocardial infarction (HCC) (02/2010), PAD (peripheral artery disease) (HCC), PEPTIC ULCER DISEASE, HX OF, PVD (peripheral vascular disease) (HCC), RESTLESS LEGS SYNDROME, SLEEP APNEA, and VERTIGO. His son is with him at this visit to help provide history.   He presents to the office today for TCM visit   Admit Date: 02/08/2018 Discharge Date 02/11/2018  Was sent to the hospital on 02/08/2018 from this office after being seen by another provider for symptomatic anemia.  He was found to have a hemoglobin of 6.9 with FOBT positive stools without any overt GI bleeding.  He was admitted and given 2 units of packed red blood cells.  While in the hospital he was seen by GI and underwent an EGD which showed Schatzki ring with no signs of ongoing bleeding.  Per GI it was okay to resume Eliquis and PPI.  At that point in time there is no further GI work-up needed.    While in the hospital his Eliquis dose was reduced to 2.5 mg twice daily due to advanced age  He refused skilled nursing facility upon discharge and was sent back to his independent living facility.  Home health nurse visited yesterday and he is going to be doing home PT for approximately 3 visits.  Since being discharged from the hospital, his son reports that he is improved tremendously on a daily basis.  Today was the first day that he did not appear to be fatigued or weak.  Patient reports that he feels back to baseline.  Family reports no dark tarry stools  since being discharged.  He continues to take his over-the-counter oral iron supplement with orange juice on a daily basis  He had CBC and BMP drawn yesterday which showed a hemoglobin of 8.3, this was down from 9.25 days ago.  His kidney function has improved back to baseline  Neither his son nor the patient have any acute issues or concerns today.   Review of Systems  Constitutional: Positive for activity change. Negative for chills, fatigue and fever.  HENT: Negative.   Respiratory: Negative.   Cardiovascular: Negative.   Gastrointestinal: Negative.   Musculoskeletal: Positive for gait problem.  Skin: Negative.   Psychiatric/Behavioral: Negative.   All other systems reviewed and are negative.  Past Medical History:  Diagnosis Date  . AAA (abdominal aortic aneurysm) (HCC)   . ABDOMINAL AORTIC ANEURYSM   . Anemia January 2012   admitted with acute MI March 15, 2010 and has significant acute blood loss anemia requiring transfusions of two units of packed RBCs.  Status post upper endoscopy March 22, 2010 without High-Risk bleeding lesion.  History of peptic ulcer disease  . Atrial fibrillation (HCC)   . BENIGN PROSTATIC HYPERTROPHY, HX OF    . COLONIC POLYPS, HX OF   . CORONARY ARTERY DISEASE    non-ST segment MI, March 15, 2010  . History of Doppler ultrasound 05/2011   h/o claudication, stable ABIs  . History of nuclear stress test 2007   persantine; normal, low risk   . HYPERLIPIDEMIA   .  HYPERTENSION   . MACULAR DEGENERATION   . Myocardial infarction (HCC) 02/2010  . PAD (peripheral artery disease) (HCC)   . PEPTIC ULCER DISEASE, HX OF   . PVD (peripheral vascular disease) (HCC)   . RESTLESS LEGS SYNDROME   . SLEEP APNEA   . VERTIGO     Social History   Socioeconomic History  . Marital status: Married    Spouse name: Not on file  . Number of children: 2  . Years of education: Not on file  . Highest education level: Not on file  Occupational History  . Not  on file  Social Needs  . Financial resource strain: Not on file  . Food insecurity:    Worry: Not on file    Inability: Not on file  . Transportation needs:    Medical: Not on file    Non-medical: Not on file  Tobacco Use  . Smoking status: Former Smoker    Packs/day: 1.00    Years: 40.00    Pack years: 40.00    Types: Cigarettes    Last attempt to quit: 02/22/1987    Years since quitting: 31.0  . Smokeless tobacco: Never Used  Substance and Sexual Activity  . Alcohol use: No  . Drug use: No  . Sexual activity: Not on file  Lifestyle  . Physical activity:    Days per week: Not on file    Minutes per session: Not on file  . Stress: Not on file  Relationships  . Social connections:    Talks on phone: Not on file    Gets together: Not on file    Attends religious service: Not on file    Active member of club or organization: Not on file    Attends meetings of clubs or organizations: Not on file    Relationship status: Not on file  . Intimate partner violence:    Fear of current or ex partner: Not on file    Emotionally abused: Not on file    Physically abused: Not on file    Forced sexual activity: Not on file  Other Topics Concern  . Not on file  Social History Narrative  . Not on file    Past Surgical History:  Procedure Laterality Date  . ABDOMINAL AORTIC ANEURYSM REPAIR     stenting   . CARDIAC CATHETERIZATION  02/2010   r/t NSTEMI; loss of SVG to RCA  . CATARACT EXTRACTION    . COLONOSCOPY WITH PROPOFOL N/A 02/04/2015   Procedure: COLONOSCOPY WITH PROPOFOL;  Surgeon: Charlott Rakes, MD;  Location: [facility] ENDOSCOPY;  Service: Endoscopy;  Laterality: N/A;  . CORONARY ARTERY BYPASS GRAFT  12/1995   x5; LIMA to ALD, vein graft to RCA (now occluded),SVG to OM, SVG to diagonal  . ESOPHAGOGASTRODUODENOSCOPY N/A 01/28/2015   Procedure: ESOPHAGOGASTRODUODENOSCOPY (EGD);  Surgeon: Vida Rigger, MD;  Location: [facility] ENDOSCOPY;  Service: Endoscopy;  Laterality: N/A;  .  ESOPHAGOGASTRODUODENOSCOPY (EGD) WITH PROPOFOL N/A 02/10/2018   Procedure: ESOPHAGOGASTRODUODENOSCOPY (EGD) WITH PROPOFOL;  Surgeon: Bernette Redbird, MD;  Location: [facility] ENDOSCOPY;  Service: Endoscopy;  Laterality: N/A;  . INGUINAL HERNIA REPAIR     Left inguinal  . TRANSTHORACIC ECHOCARDIOGRAM  04/2011   EF 55-60%; mild LVH; grade 1 diastolic dysfunction    Family History  Problem Relation Age of Onset  . Heart disease Mother   . Heart disease Father   . Stroke Father   . Heart disease Sister   . Macular degeneration Sister   .  Heart disease Sister   . Stroke Brother   . Hypertension Sister   . Heart Problems Sister        x2    Allergies  Allergen Reactions  . Cilostazol Other (See Comments)    Adverse reaction per patient - leg cramps  . Doxazosin Other (See Comments)    Leg cramps    Current Outpatient Medications on File Prior to Visit  Medication Sig Dispense Refill  . acetaminophen (TYLENOL) 325 MG tablet Take 650 mg by mouth every 6 (six) hours as needed for moderate pain.     Marland Kitchen apixaban (ELIQUIS) 2.5 MG TABS tablet Take 1 tablet (2.5 mg total) by mouth 2 (two) times daily. 60 tablet 0  . atorvastatin (LIPITOR) 20 MG tablet Take 1 tablet (20 mg total) by mouth daily. (Patient taking differently: Take 20 mg by mouth at bedtime. ) 90 tablet 3  . docusate sodium (COLACE) 100 MG capsule Take 100 mg by mouth daily before supper.     . Ferrous Gluconate (IRON) 240 (27 Fe) MG TABS Take 1 tablet by mouth at bedtime.     . furosemide (LASIX) 20 MG tablet Take 40mg  by mouth in the morning and 20mg  in the afternoon. (Patient taking differently: Take 20-40 mg by mouth See admin instructions. Take 40 mg by mouth in the morning and 20 mg in the afternoon.) 270 tablet 3  . metoprolol tartrate (LOPRESSOR) 25 MG tablet TAKE 1 TABLET BY MOUTH TWICE DAILY 180 tablet 2  . Multiple Vitamin (MULTIVITAMIN WITH MINERALS) TABS tablet Take 1 tablet by mouth daily.    . nitroGLYCERIN (NITROSTAT)  0.4 MG SL tablet Place 1 tablet (0.4 mg total) under the tongue every 5 (five) minutes as needed. FOR CHEST PAIN (Patient taking differently: Place 0.4 mg under the tongue every 5 (five) minutes as needed for chest pain. ) 25 tablet 6  . pantoprazole (PROTONIX) 40 MG tablet TAKE 1 TABLET BY MOUTH ONCE DAILY 60 tablet 0  . PRESCRIPTION MEDICATION Inhale into the lungs See admin instructions. CPAP (pressure is set at 10): Use at bedtime and during all naps    . tamsulosin (FLOMAX) 0.4 MG CAPS capsule TAKE 1 CAPSULE BY MOUTH ONCE DAILY IN THE EVENING. (Patient taking differently: Take 0.4 mg by mouth every evening. ) 90 capsule 3  . tobramycin (TOBREX) 0.3 % ophthalmic solution Place 2 drops into both eyes every 4 (four) hours. 5 mL 0  . [DISCONTINUED] carbidopa-levodopa (SINEMET CR) 25-100 MG per tablet Take by mouth. 1/2 tablet at bedtime      No current facility-administered medications on file prior to visit.     BP 120/68 (BP Location: Left Arm, Patient Position: Sitting, Cuff Size: Normal)   Pulse 97   Temp 98.1 F (36.7 C) (Oral)   Resp 16   Ht 5\' 3"  (1.6 m)   Wt 202 lb (91.6 kg)   SpO2 98%   BMI 35.78 kg/m       Objective:   Physical Exam Vitals signs and nursing note reviewed.  Constitutional:      Appearance: Normal appearance.  HENT:     Nose: Nose normal.     Mouth/Throat:     Mouth: Mucous membranes are moist.  Eyes:     Extraocular Movements: Extraocular movements intact.     Conjunctiva/sclera: Conjunctivae normal.     Pupils: Pupils are equal, round, and reactive to light.  Cardiovascular:     Rate and Rhythm: Normal rate. Rhythm irregularly  irregular.  Pulmonary:     Effort: Pulmonary effort is normal.     Breath sounds: Normal breath sounds.  Genitourinary:    Rectum: Normal. Guaiac result negative.  Musculoskeletal:     Comments: Moves with slow steady gait    Skin:    Capillary Refill: Capillary refill takes less than 2 seconds.  Neurological:      Mental Status: He is alert. Mental status is at baseline.  Psychiatric:        Mood and Affect: Mood normal.        Behavior: Behavior normal.        Thought Content: Thought content normal.        Judgment: Judgment normal.       Assessment & Plan:    1. Iron deficiency anemia due to chronic blood loss -Reviewed hospital notes, testing, and labs with patient's son.  All questions answered -Hemoccult negative for acute blood.  No dark tarry stools noted.  Will have patient continue to take oral iron supplement and vitamin C.  Seems to be improving.  We will have him follow-up in 4 to 5 days repeat CBC. -Return precautions reviewed with patient's son - CBC with Differential/Platelet; Future  2. Chronic anticoagulation - Continue with Xeralto 2.5 mg BID  - Follow up in office with blood in stool    Shirline Freesory Keoshia Steinmetz, NP

## 2018-02-22 ENCOUNTER — Other Ambulatory Visit (INDEPENDENT_AMBULATORY_CARE_PROVIDER_SITE_OTHER): Payer: Medicare Other

## 2018-02-22 DIAGNOSIS — D509 Iron deficiency anemia, unspecified: Secondary | ICD-10-CM | POA: Diagnosis not present

## 2018-02-22 LAB — CBC WITH DIFFERENTIAL/PLATELET
Basophils Absolute: 0.1 10*3/uL (ref 0.0–0.1)
Basophils Relative: 0.9 % (ref 0.0–3.0)
EOS ABS: 0.2 10*3/uL (ref 0.0–0.7)
Eosinophils Relative: 2.3 % (ref 0.0–5.0)
HCT: 29.1 % — ABNORMAL LOW (ref 39.0–52.0)
Hemoglobin: 9.3 g/dL — ABNORMAL LOW (ref 13.0–17.0)
Lymphocytes Relative: 10 % — ABNORMAL LOW (ref 12.0–46.0)
Lymphs Abs: 0.8 10*3/uL (ref 0.7–4.0)
MCHC: 32 g/dL (ref 30.0–36.0)
MCV: 86.2 fl (ref 78.0–100.0)
Monocytes Absolute: 1 10*3/uL (ref 0.1–1.0)
Monocytes Relative: 13.4 % — ABNORMAL HIGH (ref 3.0–12.0)
Neutro Abs: 5.6 10*3/uL (ref 1.4–7.7)
Neutrophils Relative %: 73.4 % (ref 43.0–77.0)
Platelets: 319 10*3/uL (ref 150.0–400.0)
RBC: 3.37 Mil/uL — ABNORMAL LOW (ref 4.22–5.81)
RDW: 16.5 % — ABNORMAL HIGH (ref 11.5–15.5)
WBC: 7.6 10*3/uL (ref 4.0–10.5)

## 2018-02-23 ENCOUNTER — Encounter: Payer: Self-pay | Admitting: Adult Health

## 2018-02-27 ENCOUNTER — Ambulatory Visit: Payer: Medicare Other | Admitting: Physician Assistant

## 2018-02-27 ENCOUNTER — Encounter: Payer: Self-pay | Admitting: Adult Health

## 2018-02-28 ENCOUNTER — Encounter: Payer: Self-pay | Admitting: Adult Health

## 2018-03-02 ENCOUNTER — Telehealth: Payer: Self-pay | Admitting: Adult Health

## 2018-03-02 ENCOUNTER — Telehealth: Payer: Self-pay | Admitting: Internal Medicine

## 2018-03-02 DIAGNOSIS — Z79899 Other long term (current) drug therapy: Secondary | ICD-10-CM

## 2018-03-02 NOTE — Telephone Encounter (Signed)
New Message:     Son wants to know if pt can have his lab work at [facility] at [place] if that is alright with Dr Rennis Golden. If he rather pt to have it here, that will be fine. Pt will needs an order please.

## 2018-03-02 NOTE — Telephone Encounter (Signed)
Notified son MD has OK'ed order for BMET. This has been placed in Epic with resulting agency of [facility] since PCP office uses this. Advised if any issues with order, PCP office can call. They will plan on labs to be done Jan 15 or 16.

## 2018-03-02 NOTE — Telephone Encounter (Signed)
Left a message for a return call.

## 2018-03-02 NOTE — Telephone Encounter (Signed)
Ok to order the [employer]. Thanks.  Dr. Rexene Edison

## 2018-03-02 NOTE — Telephone Encounter (Signed)
Ok for orders? 

## 2018-03-02 NOTE — Telephone Encounter (Signed)
Returned call to patient's son who is in town for extended holiday stay in request for labs to be ordered prior to Jan 20 office visit with MD.   He states patient had med changes in Nov by Dr. Rennis Golden (lasix adjusted/increased) and had f/up labs on 12/6 - creatinine improved. He has been doing so well with increased lasix that his swelling has drastically improved and his caregiver who had not seen him in about 1 month commented on the fact that he "had knees" and "had ankles". Patient has since been in hospital and most recent BMET on 12/26 showed even further improvement in BUN/creatinine. Son is wanting an additional BMET to be ordered prior to 1/20 visit to assess kidney function again. Explained that I will have to request this of Dr. Rennis Golden to determine if necessary at this time and son states why would this not be necessary. Explained that I cannot order BMET with MD OK (especially since last BMET ordered by MD did not suggest a f/up would be needed) and would call them either today or Monday with response to his request. Son was very insistent on the phone that this lab be ordered. They would also like to go to PCP office for lab test.

## 2018-03-02 NOTE — Telephone Encounter (Signed)
Copied from CRM [id number]. Topic: Quick Communication - Home Health Verbal Orders >> Mar 02, 2018  9:05 AM Windy Kalata, NT wrote: Caller/Agency: Carrie/[facility] Callback Number: [phone] Requesting OT/PT/Skilled Nursing/Social Work: Nurse Frequency:   Will be with the patient about 10:30am.   Patient has lost a lot of weight and a lot of fluid and the patient son is wanting the nurse to recheck BMET.

## 2018-03-04 ENCOUNTER — Encounter: Payer: Self-pay | Admitting: Podiatry

## 2018-03-04 NOTE — Progress Notes (Signed)
Subjective: Corey Mcdonald is a 85 y.o. y.o. male who presents for preventative foot care today with PAD and cc of painful, discolored, thick toenails and painful calluses which interfere with daily activities. Pain is aggravated when wearing enclosed shoe gear. Pain is relieved with periodic professional debridement.  Shirline Frees, NP is his PCP and last dos was 10/27/2017.  He remains on the blood thinner, Eliquis.  Allergies  Allergen Reactions  . Cilostazol Other (See Comments)    Adverse reaction per patient - leg cramps  . Doxazosin Other (See Comments)    Leg cramps    Objective: Vascular Examination: Capillary refill time <3 seconds x 10 digits  Dorsalis pedis pulses 2/4 b/l  Posterior tibial pulses 2/4 b/l  No digital hair x 10 digits  Skin temperature WNL b/l  Dermatological Examination: Skin thin, shiny and atrophic b/l  Toenails 1-5 b/l discolored, thick, dystrophic with subungual debris and pain with palpation to nailbeds due to thickness of nails.  Hyperkeratotic lesion noted submetatarsal head 1 b/l. No erythema, no edema, no drainage, no flocculence, no warmth.  No open wounds.  No interdigital macerations.  Musculoskeletal: Muscle strength 5/5 to all LE muscle groups  Neurological: Sensation intact with 10 gram monofilament.     Assessment: 1. Painful onychomycosis toenails 1-5 b/l 2. Calluses submet head 1 b/l 3. Peripheral arterial disease with claudication (followed by Cardiology)  Plan: 1. Toenails 1-5 b/l were debrided in length and girth without iatrogenic bleeding. 2. Hyperkeratotic lesion(s)  submet head 1 b/l pared with sterile  blade and gently smoothed with burr. 3. Patient to continue soft, supportive shoe gear 4. Patient to report any pedal injuries to medical professional  5. Follow up 3 months. Patient/POA to call should there be a concern in the interim.

## 2018-03-05 ENCOUNTER — Telehealth: Payer: Self-pay | Admitting: Adult Health

## 2018-03-05 NOTE — Telephone Encounter (Addendum)
[facility] policy is that we draw labs for our primary care providers only. We do not draw labs for outside providers.  Patient's son, Metayer (DPR), was very rude on the phone and did not want to listen to our policy. He stated he was told by cardiology if there was an issue we would call the cardiology office and "work it out". I attempted to apologize that he was given incorrect information but he did not want to listen and continued to speak over me. I advised him that if he would not let me speak to his concern the conversation would not get anywhere. He said he did not want to hear our policy he only wanted me to call cardiology and get this straightened out. He then hung up on me.   Message left for Mikey Kirschner at [facility] asking for return call to try and address this issue.  Message also routed to Julaine Fusi, RN to advise.

## 2018-03-05 NOTE — Telephone Encounter (Signed)
Copied from CRM [id number]. Topic: Quick Communication - Home Health Verbal Orders >> Mar 05, 2018  3:28 PM Arlyss Gandy, [place] wrote: Caller/AgencyJamey Reas Number: [phone] Requesting OT/PT/Skilled Nursing/Social Work: Nursing  Frequency: 2 more visits

## 2018-03-06 NOTE — Telephone Encounter (Signed)
Spoke to Trent Woods and advised that she proceed with below orders.  Nothing further needed.

## 2018-03-06 NOTE — Telephone Encounter (Signed)
See other telephone note.  Corey Mcdonald has been notified to proceed. Nothing further needed.

## 2018-03-07 ENCOUNTER — Other Ambulatory Visit: Payer: Self-pay | Admitting: Family Medicine

## 2018-03-07 NOTE — Telephone Encounter (Signed)
Last filled 02/07/18 for conjunctivitis. Should patient continue this medication?

## 2018-03-08 ENCOUNTER — Other Ambulatory Visit: Payer: Self-pay | Admitting: *Deleted

## 2018-03-08 DIAGNOSIS — Z7901 Long term (current) use of anticoagulants: Secondary | ICD-10-CM

## 2018-03-08 DIAGNOSIS — I5030 Unspecified diastolic (congestive) heart failure: Secondary | ICD-10-CM | POA: Diagnosis not present

## 2018-03-08 DIAGNOSIS — Z79899 Other long term (current) drug therapy: Secondary | ICD-10-CM | POA: Diagnosis not present

## 2018-03-08 LAB — CBC
Hematocrit: 29.7 % — ABNORMAL LOW (ref 37.5–51.0)
Hemoglobin: 9.5 g/dL — ABNORMAL LOW (ref 13.0–17.7)
MCH: 27.1 pg (ref 26.6–33.0)
MCHC: 32 g/dL (ref 31.5–35.7)
MCV: 85 fL (ref 79–97)
Platelets: 329 10*3/uL (ref 150–450)
RBC: 3.51 x10E6/uL — AB (ref 4.14–5.80)
RDW: 14.5 % (ref 11.6–15.4)
WBC: 8 10*3/uL (ref 3.4–10.8)

## 2018-03-08 LAB — BASIC METABOLIC PANEL
BUN/Creatinine Ratio: 15 (ref 10–24)
BUN: 26 mg/dL (ref 10–36)
CHLORIDE: 95 mmol/L — AB (ref 96–106)
CO2: 26 mmol/L (ref 20–29)
Calcium: 9.6 mg/dL (ref 8.6–10.2)
Creatinine, Ser: 1.77 mg/dL — ABNORMAL HIGH (ref 0.76–1.27)
GFR calc Af Amer: 38 mL/min/{1.73_m2} — ABNORMAL LOW (ref >59)
GFR calc non Af Amer: 33 mL/min/{1.73_m2} — ABNORMAL LOW (ref >59)
GLUCOSE: 114 mg/dL — AB (ref 65–99)
POTASSIUM: 4.4 mmol/L (ref 3.5–5.2)
SODIUM: 136 mmol/L (ref 134–144)

## 2018-03-08 NOTE — Telephone Encounter (Signed)
No.  This is not a chronic medication

## 2018-03-09 ENCOUNTER — Other Ambulatory Visit: Payer: Self-pay | Admitting: Adult Health

## 2018-03-09 ENCOUNTER — Other Ambulatory Visit: Payer: Self-pay | Admitting: Family Medicine

## 2018-03-09 ENCOUNTER — Encounter: Payer: Self-pay | Admitting: Adult Health

## 2018-03-09 MED ORDER — TOBRAMYCIN 0.3 % OP SOLN: 2.0000 [drp] | OPHTHALMIC | 0 refills | Status: DC

## 2018-03-09 NOTE — Telephone Encounter (Signed)
This medication was for a bacterial eye infection. This should be cleared by now. If not follow up or see eye doctor

## 2018-03-12 ENCOUNTER — Ambulatory Visit: Payer: Medicare Other | Admitting: Internal Medicine

## 2018-03-12 VITALS — BP 126/62 | HR 86 | Ht 63.0 in | Wt 195.8 lb

## 2018-03-12 DIAGNOSIS — I350 Nonrheumatic aortic (valve) stenosis: Secondary | ICD-10-CM

## 2018-03-12 DIAGNOSIS — I5033 Acute on chronic diastolic (congestive) heart failure: Secondary | ICD-10-CM

## 2018-03-12 DIAGNOSIS — I10 Essential (primary) hypertension: Secondary | ICD-10-CM | POA: Diagnosis not present

## 2018-03-12 DIAGNOSIS — Z951 Presence of aortocoronary bypass graft: Secondary | ICD-10-CM | POA: Diagnosis not present

## 2018-03-12 NOTE — Patient Instructions (Signed)
Medication Instructions:  Continue current medications If you need a refill on your cardiac medications before your next appointment, please call your pharmacy.   Follow-Up: At [facility], you and your health needs are our priority.  As part of our continuing mission to provide you with exceptional heart care, we have created designated Provider Care Teams.  These Care Teams include your primary Cardiologist (physician) and Advanced Practice Providers (APPs -  Physician Assistants and Nurse Practitioners) who all work together to provide you with the care you need, when you need it. You will need a follow up appointment in 6 months.  Please call our office 2 months in advance to schedule this appointment.  You may see Dr. Hilty or one of the following Advanced Practice Providers on your designated Care Team: Hao Meng, PA-C . Angela Duke, PA-C  Any Other Special Instructions Will Be Listed Below (If Applicable).    
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
## 2018-03-13 ENCOUNTER — Encounter: Payer: Self-pay | Admitting: Internal Medicine

## 2018-03-14 ENCOUNTER — Telehealth: Payer: Self-pay | Admitting: Adult Health

## 2018-03-14 ENCOUNTER — Encounter: Payer: Self-pay | Admitting: Adult Health

## 2018-03-14 ENCOUNTER — Telehealth: Payer: Self-pay | Admitting: Internal Medicine

## 2018-03-14 NOTE — Telephone Encounter (Signed)
Contacted pt son to inform that request for letter unable to be fulfilled. Pt son aware.

## 2018-03-14 NOTE — Telephone Encounter (Signed)
Sorry, cannot provide that letter.  Dr. Rexene Edison

## 2018-03-14 NOTE — Telephone Encounter (Signed)
Copied from CRM [id number]. Topic: Quick Communication - Home Health Verbal Orders >> Mar 14, 2018  3:20 PM Leafy Ro wrote: Caller/Agency: carrie [facility] Callback Number:[phone] Requesting /Skilled Nursing 1 x 3 to monitor blood pressure, medication, education and evaluation

## 2018-03-14 NOTE — Telephone Encounter (Signed)
DPR on file. Spoke with pt son who was concerned about Sinemet inclusion on AVS med list when med was not discussed in OV with Hilty, MD on 03/12/18.Pt son stated that he contacted pt pharmacy on file and that the last time med was filled was in 2012. Informed pt son that pt is to continue on current meds per Medication Instructions on AVS of last OV and that Sinemet was not included as part of current meds that need to be continued.   Pt son asked that triage nurse speak with nurse, Lyla Son, from [facility]. She states that pt took Rx metoprolol 25 mg around 0915 and that around 1030 pt BP reading was 92/40 and pt BP is not usually that low but pt is not symptomatic. She states pt usually in 120s systolic and that last BP reading she had on file was 126/62. She also states that, per son, pt can be taken to a location on site of [facility] to have his BP checked again in an hour. Informed nurse that Dr. Rennis Golden in office and would be consulted regarding recommendations for pt.   Per Dr. Rennis Golden, BP drop may be d/t adjustment in furosemide dosing. Advised to hold metoprolol and continue to monitor BP. Information relayed to nurse. Pt nurse verbalized understanding.  Pt son then requested that triage nurse ask Dr. Rennis Golden to provide him with a signed letter stating that it was "medically necessary" for someone to stay with the pt because of current BP so that he could present the letter to the airline as a reason for missing his flight back to [place] today. Consulted with Dr. Rennis Golden primary nurse who relayed pt son request to Dr. Rennis Golden when available. Per Dr. Rennis Golden primary nurse, Dr. Rennis Golden unable to fulfill request. Pt son stated if Dr. Rennis Golden did not provide him with a letter then he "will have lost all respect for Dr. Rennis Golden", that he would have to consider his father's care under Dr. Rennis Golden, and that it shouldn't be a problem for Dr. Rennis Golden to provide a letter because it involves patient  safety. He states that his father will be left alone and if something happens, no one will be there and that his sister is at work and has not been updated with the pt status. Pt son asked that triage nurse take his comments to Dr. Rennis Golden and request for the letter again although he states that he had to catch his flight in 20 minutes and provided a contact number by which to reach him. Will present information to Dr. Rennis Golden and return call to pt son with updates

## 2018-03-15 NOTE — Telephone Encounter (Signed)
Left message to return call to clinic for verbal orders. 

## 2018-03-15 NOTE — Telephone Encounter (Signed)
Carrie aware of VO per [place].  Nothing further needed.

## 2018-03-15 NOTE — Telephone Encounter (Signed)
Ok for verbal orders ?

## 2018-03-15 NOTE — Telephone Encounter (Signed)
Is it okay to give verbal orders?  

## 2018-03-22 ENCOUNTER — Telehealth: Payer: Self-pay | Admitting: Adult Health

## 2018-03-22 NOTE — Telephone Encounter (Signed)
Copied from CRM [id number]. Topic: Quick Communication - See Telephone Encounter >> Mar 22, 2018  1:40 PM Jens Som A wrote: CRM for notification. See Telephone encounter for: 03/22/18.  Lyla Son, RN with [facility] is calling to speak to [facility] or his CMA regarding some safety issues. Please advise [phone]

## 2018-03-22 NOTE — Telephone Encounter (Signed)
Spoke with Lyla Son, RN with [facility]- she is concerned that the patient is declining mentally - quite rapidly. Pt can walk through tasks and complete tasks but he will constantly repeat " I dont know what to do, I dont know what to do" and he has to be prompted while doing a task. Son had been staying with patient and now the patient is living by himself. Pt Son is living in [place] now and Daughter checks on him from time to time. Lyla Son states she is leaving the [employer] for vacation tomorrow for about 5 days to 1 week. Daughter told Lyla Son that they have a video surveillance system and can watch the patient through out the day.  Pt wonders through his apartment constantly throughout the day. Son told Lyla Son that he viewed on the video system the patient got up one morning went to the restroom, fixed his breakfast and then started wondering again - never ate his breakfast.   Lyla Son is very concerned and states that the patient does not need to be living by himself. States that the Son is very concerned about his Father and the Daughter does not seem too concerned of his living by himself because they can watch him online.  Pt lives in the Independent Living Facility.  Pt is becoming very anxious as well and she feels that he may need to be evaluated for Fasting progressing dementia? Lyla Son is concerned that if something were to happen to him that he would not be able or know to call someone to help.   Advised that I will send this the PCP for recommendations.

## 2018-03-23 NOTE — Telephone Encounter (Signed)
Spoke with Corey Mcdonald, [facility]. Corey SonCarrie states that the the facility does have someone that might be able to check in on him from time to time throughout the day but not at night time or on weekends. She is not sure at this point what to do - she has already made the family aware and the PCP - she just wanted to ensure the someone other than the family was aware of his current state. Corey SonCarrie states that she will call back if anything else comes up. Nothing further needed.
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
## 2018-04-09 ENCOUNTER — Ambulatory Visit: Payer: Medicare Other | Admitting: Internal Medicine

## 2018-04-11 ENCOUNTER — Other Ambulatory Visit: Payer: Self-pay | Admitting: Internal Medicine

## 2018-05-04 ENCOUNTER — Ambulatory Visit: Payer: Medicare Other | Admitting: Podiatry

## 2018-06-05 ENCOUNTER — Other Ambulatory Visit: Payer: Self-pay | Admitting: Adult Health

## 2018-06-05 NOTE — Telephone Encounter (Signed)
Should no longer need this medication. If he continues to have an eye infection please have him follow up or see an eye doctor

## 2018-06-14 ENCOUNTER — Ambulatory Visit (INDEPENDENT_AMBULATORY_CARE_PROVIDER_SITE_OTHER): Payer: Medicare Other | Admitting: Adult Health

## 2018-06-14 ENCOUNTER — Encounter: Payer: Self-pay | Admitting: Adult Health

## 2018-06-14 ENCOUNTER — Other Ambulatory Visit: Payer: Self-pay

## 2018-06-14 DIAGNOSIS — N3000 Acute cystitis without hematuria: Secondary | ICD-10-CM

## 2018-06-14 DIAGNOSIS — R410 Disorientation, unspecified: Secondary | ICD-10-CM | POA: Diagnosis not present

## 2018-06-14 MED ORDER — SULFAMETHOXAZOLE-TRIMETHOPRIM 800-160 MG PO TABS: 1.0000 | ORAL_TABLET | Freq: Two times a day (BID) | ORAL | 0 refills | Status: DC

## 2018-06-14 MED ORDER — CIPROFLOXACIN HCL 500 MG PO TABS: 500.0000 mg | ORAL_TABLET | Freq: Two times a day (BID) | ORAL | 0 refills | Status: DC

## 2018-06-14 NOTE — Progress Notes (Signed)
Virtual Visit via Video Note  I connected with Corey Mcdonald on 06/14/18 at  1:30 PM EDT by a video enabled telemedicine application and verified that I am speaking with the correct person using two identifiers.  Location patient: home Location provider:work or home office Persons participating in the virtual visit: patient, provider, daughter  I discussed the limitations of evaluation and management by telemedicine and the availability of in person appointments. The patient expressed understanding and agreed to proceed.   HPI: 85 year old male who is being evaluated today for confusion and agitation.  Patient reports "I feel like I am in a fog and I feel more confused".  His daughter reports that over the last week he has had increased confusion and agitation.  She reports that today he does not remember that he had breakfast or lunch.  She also believes that he is having some incontinence issues as she is finding more underwear in the laundry.  She denies noticing any slurred speech or facial droop.    Is not complaining of fevers, chills, dysuria or hematuria.   ROS: See pertinent positives and negatives per HPI.  Past Medical History:  Diagnosis Date  . AAA (abdominal aortic aneurysm) (HCC)   . ABDOMINAL AORTIC ANEURYSM   . Anemia January 2012   admitted with acute MI March 15, 2010 and has significant acute blood loss anemia requiring transfusions of two units of packed RBCs.  Status post upper endoscopy March 22, 2010 without High-Risk bleeding lesion.  History of peptic ulcer disease  . Atrial fibrillation (HCC)   . BENIGN PROSTATIC HYPERTROPHY, HX OF    . COLONIC POLYPS, HX OF   . CORONARY ARTERY DISEASE    non-ST segment MI, March 15, 2010  . History of Doppler ultrasound 05/2011   h/o claudication, stable ABIs  . History of nuclear stress test 2007   persantine; normal, low risk   . HYPERLIPIDEMIA   . HYPERTENSION   . MACULAR DEGENERATION   . Myocardial infarction  (HCC) 02/2010  . PAD (peripheral artery disease) (HCC)   . PEPTIC ULCER DISEASE, HX OF   . PVD (peripheral vascular disease) (HCC)   . RESTLESS LEGS SYNDROME   . SLEEP APNEA   . VERTIGO     Past Surgical History:  Procedure Laterality Date  . ABDOMINAL AORTIC ANEURYSM REPAIR     stenting   . CARDIAC CATHETERIZATION  02/2010   r/t NSTEMI; loss of SVG to RCA  . CATARACT EXTRACTION    . COLONOSCOPY WITH PROPOFOL N/A 02/04/2015   Procedure: COLONOSCOPY WITH PROPOFOL;  Surgeon: Charlott RakesVincent Schooler, MD;  [facility];  Service: Endoscopy;  Laterality: N/A;  . CORONARY ARTERY BYPASS GRAFT  12/1995   x5; LIMA to ALD, vein graft to RCA (now occluded),SVG to OM, SVG to diagonal  . ESOPHAGOGASTRODUODENOSCOPY N/A 01/28/2015   Procedure: ESOPHAGOGASTRODUODENOSCOPY (EGD);  Surgeon: Vida RiggerMarc Magod, MD;  [facility];  Service: Endoscopy;  Laterality: N/A;  . ESOPHAGOGASTRODUODENOSCOPY (EGD) WITH PROPOFOL N/A 02/10/2018   Procedure: ESOPHAGOGASTRODUODENOSCOPY (EGD) WITH PROPOFOL;  Surgeon: Bernette RedbirdBuccini, Robert, MD;  [facility];  Service: Endoscopy;  Laterality: N/A;  . INGUINAL HERNIA REPAIR     Left inguinal  . TRANSTHORACIC ECHOCARDIOGRAM  04/2011   EF 55-60%; mild LVH; grade 1 diastolic dysfunction    Family History  Problem Relation Age of Onset  . Heart disease Mother   . Heart disease Father   . Stroke Father   . Heart disease Sister   . Macular  degeneration Sister   . Heart disease Sister   . Stroke Brother   . Hypertension Sister   . Heart Problems Sister        x2     Current Outpatient Medications:  .  acetaminophen (TYLENOL) 325 MG tablet, Take 650 mg by mouth every 6 (six) hours as needed for moderate pain. , Disp: , Rfl:  .  apixaban (ELIQUIS) 2.5 MG TABS tablet, Take 1 tablet (2.5 mg total) by mouth 2 (two) times daily., Disp: 60 tablet, Rfl: 0 .  atorvastatin (LIPITOR) 20 MG tablet, Take 1 tablet (20 mg total) by mouth daily. (Patient taking differently:  Take 20 mg by mouth at bedtime. ), Disp: 90 tablet, Rfl: 3 .  docusate sodium (COLACE) 100 MG capsule, Take 100 mg by mouth daily before supper. , Disp: , Rfl:  .  Ferrous Gluconate (IRON) 240 (27 Fe) MG TABS, Take 1 tablet by mouth at bedtime. , Disp: , Rfl:  .  furosemide (LASIX) 20 MG tablet, Take 40mg  by mouth in the morning and 20mg  in the afternoon. (Patient taking differently: Take 20-40 mg by mouth See admin instructions. Take 40 mg by mouth in the morning and 20 mg in the afternoon.), Disp: 270 tablet, Rfl: 3 .  metoprolol tartrate (LOPRESSOR) 25 MG tablet, TAKE 1 TABLET BY MOUTH TWICE DAILY, Disp: 180 tablet, Rfl: 2 .  Multiple Vitamin (MULTIVITAMIN WITH MINERALS) TABS tablet, Take 1 tablet by mouth daily., Disp: , Rfl:  .  nitroGLYCERIN (NITROSTAT) 0.4 MG SL tablet, Place 1 tablet (0.4 mg total) under the tongue every 5 (five) minutes as needed. FOR CHEST PAIN (Patient taking differently: Place 0.4 mg under the tongue every 5 (five) minutes as needed for chest pain. ), Disp: 25 tablet, Rfl: 6 .  pantoprazole (PROTONIX) 40 MG tablet, TAKE 1 TABLET BY MOUTH ONCE DAILY, Disp: 90 tablet, Rfl: 3 .  PRESCRIPTION MEDICATION, Inhale into the lungs See admin instructions. CPAP (pressure is set at 10): Use at bedtime and during all naps, Disp: , Rfl:  .  sulfamethoxazole-trimethoprim (BACTRIM DS) 800-160 MG tablet, Take 1 tablet by mouth 2 (two) times daily., Disp: 14 tablet, Rfl: 0 .  tamsulosin (FLOMAX) 0.4 MG CAPS capsule, TAKE 1 CAPSULE BY MOUTH ONCE DAILY IN THE EVENING. (Patient taking differently: Take 0.4 mg by mouth every evening. ), Disp: 90 capsule, Rfl: 3  EXAM:  VITALS per patient if applicable:  GENERAL: alert, oriented, appears well and in no acute distress  HEENT: atraumatic, conjunttiva clear, no obvious abnormalities on inspection of external nose and ears  NECK: normal movements of the head and neck  LUNGS: on inspection no signs of respiratory distress, breathing rate  appears normal, no obvious gross SOB, gasping or wheezing  CV: no obvious cyanosis  MS: moves all visible extremities without noticeable abnormality  PSYCH/NEURO: pleasant and cooperative, no obvious depression or anxiety, speech and thought processing grossly intact  ASSESSMENT AND PLAN:  Discussed the following assessment and plan:  Sudden onset confusion and agitation.  Will treat for suspected urinary infection with Bactrim twice daily x7 days.  Advised close follow-up if symptoms do not improve within the next 2 or 3 days he will need to come in and be evaluated in the office   Confusion  Acute cystitis without hematuria - Plan: sulfamethoxazole-trimethoprim (BACTRIM DS) 800-160 MG tablet, DISCONTINUED: ciprofloxacin (CIPRO) 500 MG tablet   I discussed the assessment and treatment plan with the patient. The patient was provided an opportunity  to ask questions and all were answered. The patient agreed with the plan and demonstrated an understanding of the instructions.   The patient was advised to call back or seek an in-person evaluation if the symptoms worsen or if the condition fails to improve as anticipated.   Dorothyann Peng, NP

## 2018-06-18 ENCOUNTER — Telehealth: Payer: Self-pay | Admitting: Adult Health

## 2018-06-18 NOTE — Telephone Encounter (Signed)
Copied from CRM [id number]. Topic: Quick Communication - See Telephone Encounter >> Jun 18, 2018  3:46 PM Rica Koyanagi, Barbee Cough wrote: CRM for notification. See Telephone encounter for: 06/18/18. Daughter Amy called - uti treatment is going fine  Pt had a low blood sugar incident on Fri - that has been fine,  daughter is concerned about hemoglobin level.  Can you send orders to nursing home to have hemoglobin checked? They [facility] - to check the hemoglobin. Please send orders to - [place] ask for [employer] phone [phone]

## 2018-06-19 ENCOUNTER — Telehealth: Payer: Self-pay | Admitting: *Deleted

## 2018-06-19 ENCOUNTER — Encounter: Payer: Self-pay | Admitting: Family Medicine

## 2018-06-19 NOTE — Telephone Encounter (Signed)
Copied from CRM [id number]. Topic: Quick Communication - See Telephone Encounter >> Jun 18, 2018  3:46 PM Rica Koyanagi, Barbee Cough wrote: CRM for notification. See Telephone encounter for: 06/18/18. Daughter Amy called - uti treatment is going fine  Pt had a low blood sugar incident on Fri - that has been fine,  daughter is concerned about hemoglobin level.  Can you send orders to nursing home to have hemoglobin checked? They [facility] - to check the hemoglobin. Please send orders to - [place] ask for Lesle Chris phone [phone] >> Jun 19, 2018 11:02 AM Dalphine Handing A wrote: Jenene Slicker called back and asked that orders for bloodwork be faxed to [phone] and put Attn April Holding

## 2018-06-19 NOTE — Telephone Encounter (Signed)
Order sent by fax to [facility] @ [phone].

## 2018-06-19 NOTE — Telephone Encounter (Signed)
Order sent by fax to [facility] @ [phone]. 

## 2018-06-19 NOTE — Telephone Encounter (Signed)
Left a message for Corey Mcdonald to call back.

## 2018-06-19 NOTE — Telephone Encounter (Signed)
Ok for order for CBC and iron panel

## 2018-06-22 DIAGNOSIS — D649 Anemia, unspecified: Secondary | ICD-10-CM | POA: Diagnosis not present

## 2018-07-04 ENCOUNTER — Other Ambulatory Visit: Payer: Self-pay | Admitting: Cardiology

## 2018-08-02 ENCOUNTER — Other Ambulatory Visit: Payer: Self-pay | Admitting: Adult Health

## 2018-08-02 ENCOUNTER — Encounter: Payer: Self-pay | Admitting: Adult Health

## 2018-08-02 MED ORDER — TOBRAMYCIN 0.3 % OP SOLN: 2.0000 [drp] | OPHTHALMIC | 0 refills | Status: AC

## 2018-08-02 NOTE — Progress Notes (Signed)
[employer]

## 2018-09-20 ENCOUNTER — Telehealth: Payer: Self-pay | Admitting: Internal Medicine

## 2018-09-20 NOTE — Telephone Encounter (Signed)
I left a message for pt to cll back and give hjis consent for hi Virtual Visit for 09-21-18.

## 2018-09-21 ENCOUNTER — Telehealth (INDEPENDENT_AMBULATORY_CARE_PROVIDER_SITE_OTHER): Payer: Medicare Other | Admitting: Internal Medicine

## 2018-09-21 ENCOUNTER — Encounter: Payer: Self-pay | Admitting: Internal Medicine

## 2018-09-21 VITALS — BP 110/60 | HR 63

## 2018-09-21 DIAGNOSIS — I714 Abdominal aortic aneurysm, without rupture, unspecified: Secondary | ICD-10-CM

## 2018-09-21 DIAGNOSIS — I2510 Atherosclerotic heart disease of native coronary artery without angina pectoris: Secondary | ICD-10-CM

## 2018-09-21 DIAGNOSIS — Z9989 Dependence on other enabling machines and devices: Secondary | ICD-10-CM

## 2018-09-21 DIAGNOSIS — G4733 Obstructive sleep apnea (adult) (pediatric): Secondary | ICD-10-CM

## 2018-09-21 DIAGNOSIS — I482 Chronic atrial fibrillation, unspecified: Secondary | ICD-10-CM | POA: Diagnosis not present

## 2018-09-21 DIAGNOSIS — I5032 Chronic diastolic (congestive) heart failure: Secondary | ICD-10-CM

## 2018-09-21 DIAGNOSIS — I5030 Unspecified diastolic (congestive) heart failure: Secondary | ICD-10-CM

## 2018-09-21 DIAGNOSIS — I10 Essential (primary) hypertension: Secondary | ICD-10-CM

## 2018-09-21 DIAGNOSIS — Z951 Presence of aortocoronary bypass graft: Secondary | ICD-10-CM

## 2018-09-21 NOTE — Patient Instructions (Signed)
Medication Instructions:  Your physician recommends that you continue on your current medications as directed. Please refer to the Current Medication list given to you today.  If you need a refill on your cardiac medications before your next appointment, please call your pharmacy.    Follow-Up: At [facility], you and your health needs are our priority.  As part of our continuing mission to provide you with exceptional heart care, we have created designated Provider Care Teams.  These Care Teams include your primary Cardiologist (physician) and Advanced Practice Providers (APPs -  Physician Assistants and Nurse Practitioners) who all work together to provide you with the care you need, when you need it. You will need a follow up appointment in 6 months.  Please call our office 2 months in advance to schedule this appointment.  You may see Dr. Hilty or one of the following Advanced Practice Providers on your designated Care Team: Hao Meng, PA-C . Angela Duke, PA-C  Any Other Special Instructions Will Be Listed Below (If Applicable).    

## 2018-09-21 NOTE — Progress Notes (Signed)
Virtual Visit via Telephone Note   This visit type was conducted due to national recommendations for restrictions regarding the COVID-19 Pandemic (e.g. social distancing) in an effort to limit this patient's exposure and mitigate transmission in our community.  Due to his co-morbid illnesses, this patient is at least at moderate risk for complications without adequate follow up.  This format is felt to be most appropriate for this patient at this time.  The patient did not have access to video technology/had technical difficulties with video requiring transitioning to audio format only (telephone).  All issues noted in this document were discussed and addressed.  No physical exam could be performed with this format.  Please refer to the patient's chart for his  consent to telehealth for [facility].   Evaluation Performed:  Telephone follow-up  Date:  09/21/2018   ID:  Corey Mcdonald, DOB 1923/08/31, MRN [id number]  Patient Location:  [address] [place] [address]  Provider location:   [address], [place], [place] [address]  PCP:  Dorothyann Peng, NP  Cardiologist:  No primary care provider on file. Electrophysiologist:  None   Chief Complaint:  No complaints  History of Present Illness:    Corey Mcdonald is a 84 y.o. male who presents via audio/video conferencing for a telehealth visit today. [name] is doing well - eating somewhat less. Some forgetfulness, no significant heart failure symptoms. No bleeding on Eliquis. No significant falls. Denies chest pain. BP normal today.  The patient does not have symptoms concerning for COVID-19 infection (fever, chills, cough, or new SHORTNESS OF BREATH).    Prior CV studies:   The following studies were reviewed today:  Chart reviewed Labwork  PMHx:  Past Medical History:  Diagnosis Date  . AAA (abdominal aortic aneurysm) ([place])   . ABDOMINAL AORTIC ANEURYSM   . Anemia January 2012   admitted with  acute MI March 15, 2010 and has significant acute blood loss anemia requiring transfusions of two units of packed RBCs.  Status post upper endoscopy March 22, 2010 without High-Risk bleeding lesion.  History of peptic ulcer disease  . Atrial fibrillation ([place])   . BENIGN PROSTATIC HYPERTROPHY, HX OF    . COLONIC POLYPS, HX OF   . CORONARY ARTERY DISEASE    non-ST segment MI, March 15, 2010  . History of Doppler ultrasound 05/2011   h/o claudication, stable ABIs  . History of nuclear stress test 2007   persantine; normal, low risk   . HYPERLIPIDEMIA   . HYPERTENSION   . MACULAR DEGENERATION   . Myocardial infarction ([place]) 02/2010  . PAD (peripheral artery disease) ([place])   . PEPTIC ULCER DISEASE, HX OF   . PVD (peripheral vascular disease) ([place])   . RESTLESS LEGS SYNDROME   . SLEEP APNEA   . VERTIGO     Past Surgical History:  Procedure Laterality Date  . ABDOMINAL AORTIC ANEURYSM REPAIR     stenting   . CARDIAC CATHETERIZATION  02/2010   r/t NSTEMI; loss of SVG to RCA  . CATARACT EXTRACTION    . COLONOSCOPY WITH PROPOFOL N/A 02/04/2015   Procedure: COLONOSCOPY WITH PROPOFOL;  Surgeon: Wilford Corner, MD;  Location: [facility] ENDOSCOPY;  Service: Endoscopy;  Laterality: N/A;  . CORONARY ARTERY BYPASS GRAFT  12/1995   x5; LIMA to ALD, vein graft to RCA (now occluded),SVG to OM, SVG to diagonal  . ESOPHAGOGASTRODUODENOSCOPY N/A 01/28/2015   Procedure: ESOPHAGOGASTRODUODENOSCOPY (EGD);  Surgeon: Clarene Essex, MD;  Location[facility];  Service: Endoscopy;  Laterality: N/A;  . ESOPHAGOGASTRODUODENOSCOPY (EGD) WITH PROPOFOL N/A 02/10/2018   Procedure: ESOPHAGOGASTRODUODENOSCOPY (EGD) WITH PROPOFOL;  Surgeon: Bernette RedbirdBuccini, Robert, MD;  [facility];  Service: Endoscopy;  Laterality: N/A;  . INGUINAL HERNIA REPAIR     Left inguinal  . TRANSTHORACIC ECHOCARDIOGRAM  04/2011   EF 55-60%; mild LVH; grade 1 diastolic dysfunction    FAMHx:  Family History  Problem Relation Age of  Onset  . Heart disease Mother   . Heart disease Father   . Stroke Father   . Heart disease Sister   . Macular degeneration Sister   . Heart disease Sister   . Stroke Brother   . Hypertension Sister   . Heart Problems Sister        x2    SOCHx:   reports that he quit smoking about 31 years ago. His smoking use included cigarettes. He has a 40.00 pack-year smoking history. He has never used smokeless tobacco. He reports that he does not drink alcohol or use drugs.  ALLERGIES:  Allergies  Allergen Reactions  . Cilostazol Other (See Comments)    Adverse reaction per patient - leg cramps  . Doxazosin Other (See Comments)    Leg cramps    MEDS:  Current Meds  Medication Sig  . acetaminophen (TYLENOL) 325 MG tablet Take 650 mg by mouth every 6 (six) hours as needed for moderate pain.   Marland Kitchen. apixaban (ELIQUIS) 2.5 MG TABS tablet Take 1 tablet (2.5 mg total) by mouth 2 (two) times daily.  Marland Kitchen. atorvastatin (LIPITOR) 20 MG tablet Take 1 tablet (20 mg total) by mouth daily. (Patient taking differently: Take 20 mg by mouth at bedtime. )  . docusate sodium (COLACE) 100 MG capsule Take 100 mg by mouth daily before supper.   . Ferrous Gluconate (IRON) 240 (27 Fe) MG TABS Take 1 tablet by mouth at bedtime.   . furosemide (LASIX) 20 MG tablet Take 40mg  by mouth in the morning and 20mg  in the afternoon. (Patient taking differently: Take 20-40 mg by mouth See admin instructions. Take 40 mg by mouth in the morning and 20 mg in the afternoon.)  . metoprolol tartrate (LOPRESSOR) 25 MG tablet Take 1 tablet by mouth twice daily  . Multiple Vitamin (MULTIVITAMIN WITH MINERALS) TABS tablet Take 1 tablet by mouth daily.  . nitroGLYCERIN (NITROSTAT) 0.4 MG SL tablet Place 1 tablet (0.4 mg total) under the tongue every 5 (five) minutes as needed. FOR CHEST PAIN (Patient taking differently: Place 0.4 mg under the tongue every 5 (five) minutes as needed for chest pain. )  . pantoprazole (PROTONIX) 40 MG tablet TAKE  1 TABLET BY MOUTH ONCE DAILY  . PRESCRIPTION MEDICATION Inhale into the lungs See admin instructions. CPAP (pressure is set at 10): Use at bedtime and during all naps  . tamsulosin (FLOMAX) 0.4 MG CAPS capsule TAKE 1 CAPSULE BY MOUTH ONCE DAILY IN THE EVENING. (Patient taking differently: Take 0.4 mg by mouth every evening. )  . tobramycin (TOBREX) 0.3 % ophthalmic solution Place 2 drops into both eyes every 4 (four) hours. (Patient taking differently: Place 2 drops into both eyes every 4 (four) hours as needed. )     ROS: Pertinent items noted in HPI and remainder of comprehensive ROS otherwise negative.  Labs/Other Tests and Data Reviewed:    Recent Labs: 10/06/2017: Pro B Natriuretic peptide (BNP) 211.0 10/27/2017: TSH 3.93 02/09/2018: ALT 18 03/08/2018: BUN 26; Creatinine, Ser 1.77; Hemoglobin 9.5; Platelets  329; Potassium 4.4; Sodium 136   Recent Lipid Panel Lab Results  Component Value Date/Time   CHOL 138 10/27/2017 09:11 AM   TRIG 88.0 10/27/2017 09:11 AM   HDL 50.20 10/27/2017 09:11 AM   CHOLHDL 3 10/27/2017 09:11 AM   LDLCALC 70 10/27/2017 09:11 AM    Wt Readings from Last 3 Encounters:  03/12/18 195 lb 12.8 oz (88.8 kg)  02/16/18 202 lb (91.6 kg)  02/10/18 208 lb (94.3 kg)     Exam:    Vital Signs:  BP 110/60 (BP Location: Left Arm)   Pulse 63    Exam not performed due to telephone visit  ASSESSMENT & PLAN:    1. Chronic combined systolic and diastolic heart failure 2. Coronary artery disease status post four-vessel CABG with an occluded graft to the right coronary in 1997 3. Ischemic cardiomyopathy EF 45% (improved to 50-55% on 12/2015) 4. Abdominal aortic aneurysm status post stent grafting 5. Bilateral claudication with reduced ABIs 6. Hypertension 7. Dyslipidemia 8. Persistent atrial fibrillation-CHADSVASC 4 - on Eliquis 9. Recurrent GI bleeding - aspirin stopped  Mr. Walkowski has stable heart failure symptoms. Unfortunately, BP or weight were not  available today. Daughter Amy believes he is not more swollen. Sleeps a lot and fatigues easily. Has had more forgetfulness. Likes sweets. No chest pain or dyspnea. Complaints with CPAP. No medicine changes today.  COVID-19 Education: The signs and symptoms of COVID-19 were discussed with the patient and how to seek care for testing (follow up with PCP or arrange E-visit).  The importance of social distancing was discussed today.  Patient Risk:   After full review of this patients clinical status, I feel that they are at least moderate risk at this time.  Time:   Today, I have spent 25 minutes with the patient with telehealth technology discussing afib, diastolic CHF, coronary disease.     Medication Adjustments/Labs and Tests Ordered: Current medicines are reviewed at length with the patient today.  Concerns regarding medicines are outlined above.   Tests Ordered: No orders of the defined types were placed in this encounter.   Medication Changes: No orders of the defined types were placed in this encounter.   Disposition:  in 6 month(s)  Chrystie NoseKenneth C. , MD, Devereux Treatment NetworkFACC, FACP  [place]  [facility]  Medical Director of the [facility] Lipid [facility] &  Cardiovascular Risk Reduction Clinic Diplomate of the American Board of Clinical Lipidology Attending Cardiologist  Direct Dial: [phone]  Fax: [phone]  Website:  [email or web address]  Chrystie NoseKenneth C , MD  09/21/2018 10:35 AM

## 2018-10-03 ENCOUNTER — Other Ambulatory Visit: Payer: Self-pay | Admitting: Cardiology

## 2018-11-12 ENCOUNTER — Other Ambulatory Visit: Payer: Self-pay | Admitting: Adult Health

## 2018-11-14 ENCOUNTER — Encounter: Payer: Self-pay | Admitting: Family Medicine

## 2018-11-14 NOTE — Telephone Encounter (Signed)
[place] for 90 days and then lets schedule for CPE

## 2018-11-14 NOTE — Telephone Encounter (Signed)
SENT TO THE PHARMACY AS INSTRUCTED.  LETTER RELEASED TO MYCHART.

## 2018-11-23 ENCOUNTER — Encounter: Payer: Self-pay | Admitting: Adult Health

## 2018-12-06 ENCOUNTER — Encounter: Payer: Self-pay | Admitting: Adult Health

## 2018-12-10 DIAGNOSIS — H00021 Hordeolum internum right upper eyelid: Secondary | ICD-10-CM | POA: Diagnosis not present

## 2019-01-04 ENCOUNTER — Other Ambulatory Visit: Payer: Self-pay | Admitting: Internal Medicine

## 2019-01-07 DIAGNOSIS — H5203 Hypermetropia, bilateral: Secondary | ICD-10-CM | POA: Diagnosis not present

## 2019-01-07 DIAGNOSIS — H16141 Punctate keratitis, right eye: Secondary | ICD-10-CM | POA: Diagnosis not present

## 2019-01-07 DIAGNOSIS — H02102 Unspecified ectropion of right lower eyelid: Secondary | ICD-10-CM | POA: Diagnosis not present

## 2019-01-07 DIAGNOSIS — H52223 Regular astigmatism, bilateral: Secondary | ICD-10-CM | POA: Diagnosis not present

## 2019-01-07 NOTE — Telephone Encounter (Signed)
14m 88.8kg Scr 1.77 03/08/18 Lovw/hilty 09/21/18 Pt requesting 5mg  eliquis but only qualifies for 2.5mg  needs clinician review

## 2019-01-09 NOTE — Telephone Encounter (Signed)
lmomed the pt regarding the dose decrease to the 2.5mg  eliquis and instructed the pt to call back if they have any questions.

## 2019-01-18 ENCOUNTER — Other Ambulatory Visit: Payer: Self-pay | Admitting: Internal Medicine

## 2019-01-18 ENCOUNTER — Other Ambulatory Visit: Payer: Self-pay | Admitting: Family Medicine

## 2019-02-20 ENCOUNTER — Other Ambulatory Visit: Payer: Self-pay | Admitting: Adult Health

## 2019-03-05 ENCOUNTER — Emergency Department (HOSPITAL_COMMUNITY): Payer: Medicare Other

## 2019-03-05 ENCOUNTER — Other Ambulatory Visit: Payer: Self-pay

## 2019-03-05 ENCOUNTER — Inpatient Hospital Stay (HOSPITAL_COMMUNITY)
Admission: EM | Admit: 2019-03-05 | Discharge: 2019-03-08 | DRG: 065 | Disposition: A | Discharge status: Hospice | Payer: Medicare Other | Source: Skilled Nursing Facility | Attending: Internal Medicine | Admitting: Internal Medicine

## 2019-03-05 ENCOUNTER — Encounter (HOSPITAL_COMMUNITY): Payer: Self-pay

## 2019-03-05 DIAGNOSIS — R29810 Facial weakness: Secondary | ICD-10-CM | POA: Diagnosis not present

## 2019-03-05 DIAGNOSIS — Z515 Encounter for palliative care: Secondary | ICD-10-CM

## 2019-03-05 DIAGNOSIS — G9340 Encephalopathy, unspecified: Secondary | ICD-10-CM | POA: Diagnosis present

## 2019-03-05 DIAGNOSIS — I350 Nonrheumatic aortic (valve) stenosis: Secondary | ICD-10-CM | POA: Diagnosis not present

## 2019-03-05 DIAGNOSIS — S3993XA Unspecified injury of pelvis, initial encounter: Secondary | ICD-10-CM | POA: Diagnosis not present

## 2019-03-05 DIAGNOSIS — N179 Acute kidney failure, unspecified: Secondary | ICD-10-CM | POA: Diagnosis present

## 2019-03-05 DIAGNOSIS — Z01818 Encounter for other preprocedural examination: Secondary | ICD-10-CM | POA: Diagnosis not present

## 2019-03-05 DIAGNOSIS — R4701 Aphasia: Secondary | ICD-10-CM | POA: Diagnosis not present

## 2019-03-05 DIAGNOSIS — I2510 Atherosclerotic heart disease of native coronary artery without angina pectoris: Secondary | ICD-10-CM | POA: Diagnosis present

## 2019-03-05 DIAGNOSIS — Z888 Allergy status to other drugs, medicaments and biological substances status: Secondary | ICD-10-CM

## 2019-03-05 DIAGNOSIS — R4182 Altered mental status, unspecified: Secondary | ICD-10-CM | POA: Diagnosis not present

## 2019-03-05 DIAGNOSIS — I639 Cerebral infarction, unspecified: Secondary | ICD-10-CM | POA: Diagnosis not present

## 2019-03-05 DIAGNOSIS — G4733 Obstructive sleep apnea (adult) (pediatric): Secondary | ICD-10-CM | POA: Diagnosis present

## 2019-03-05 DIAGNOSIS — W19XXXA Unspecified fall, initial encounter: Secondary | ICD-10-CM

## 2019-03-05 DIAGNOSIS — Z7901 Long term (current) use of anticoagulants: Secondary | ICD-10-CM

## 2019-03-05 DIAGNOSIS — Z66 Do not resuscitate: Secondary | ICD-10-CM | POA: Diagnosis not present

## 2019-03-05 DIAGNOSIS — Z20822 Contact with and (suspected) exposure to covid-19: Secondary | ICD-10-CM | POA: Diagnosis present

## 2019-03-05 DIAGNOSIS — F05 Delirium due to known physiological condition: Secondary | ICD-10-CM | POA: Diagnosis not present

## 2019-03-05 DIAGNOSIS — F0390 Unspecified dementia without behavioral disturbance: Secondary | ICD-10-CM | POA: Diagnosis not present

## 2019-03-05 DIAGNOSIS — N1832 Chronic kidney disease, stage 3b: Secondary | ICD-10-CM | POA: Diagnosis present

## 2019-03-05 DIAGNOSIS — I361 Nonrheumatic tricuspid (valve) insufficiency: Secondary | ICD-10-CM | POA: Diagnosis not present

## 2019-03-05 DIAGNOSIS — G8191 Hemiplegia, unspecified affecting right dominant side: Secondary | ICD-10-CM | POA: Diagnosis not present

## 2019-03-05 DIAGNOSIS — R402342 Coma scale, best motor response, flexion withdrawal, at arrival to emergency department: Secondary | ICD-10-CM | POA: Diagnosis not present

## 2019-03-05 DIAGNOSIS — N401 Enlarged prostate with lower urinary tract symptoms: Secondary | ICD-10-CM | POA: Diagnosis present

## 2019-03-05 DIAGNOSIS — Z86010 Personal history of colonic polyps: Secondary | ICD-10-CM

## 2019-03-05 DIAGNOSIS — R29715 NIHSS score 15: Secondary | ICD-10-CM | POA: Diagnosis present

## 2019-03-05 DIAGNOSIS — I714 Abdominal aortic aneurysm, without rupture: Secondary | ICD-10-CM | POA: Diagnosis present

## 2019-03-05 DIAGNOSIS — E7800 Pure hypercholesterolemia, unspecified: Secondary | ICD-10-CM | POA: Diagnosis not present

## 2019-03-05 DIAGNOSIS — S0990XA Unspecified injury of head, initial encounter: Secondary | ICD-10-CM | POA: Diagnosis not present

## 2019-03-05 DIAGNOSIS — H543 Unqualified visual loss, both eyes: Secondary | ICD-10-CM | POA: Diagnosis present

## 2019-03-05 DIAGNOSIS — Z87891 Personal history of nicotine dependence: Secondary | ICD-10-CM

## 2019-03-05 DIAGNOSIS — E785 Hyperlipidemia, unspecified: Secondary | ICD-10-CM | POA: Diagnosis present

## 2019-03-05 DIAGNOSIS — I739 Peripheral vascular disease, unspecified: Secondary | ICD-10-CM | POA: Diagnosis present

## 2019-03-05 DIAGNOSIS — R0902 Hypoxemia: Secondary | ICD-10-CM | POA: Diagnosis not present

## 2019-03-05 DIAGNOSIS — Z79899 Other long term (current) drug therapy: Secondary | ICD-10-CM

## 2019-03-05 DIAGNOSIS — R402222 Coma scale, best verbal response, incomprehensible words, at arrival to emergency department: Secondary | ICD-10-CM | POA: Diagnosis not present

## 2019-03-05 DIAGNOSIS — H3530 Unspecified macular degeneration: Secondary | ICD-10-CM | POA: Diagnosis present

## 2019-03-05 DIAGNOSIS — I252 Old myocardial infarction: Secondary | ICD-10-CM

## 2019-03-05 DIAGNOSIS — I482 Chronic atrial fibrillation, unspecified: Secondary | ICD-10-CM | POA: Diagnosis not present

## 2019-03-05 DIAGNOSIS — R404 Transient alteration of awareness: Secondary | ICD-10-CM | POA: Diagnosis not present

## 2019-03-05 DIAGNOSIS — I63412 Cerebral infarction due to embolism of left middle cerebral artery: Secondary | ICD-10-CM | POA: Diagnosis not present

## 2019-03-05 DIAGNOSIS — Z951 Presence of aortocoronary bypass graft: Secondary | ICD-10-CM

## 2019-03-05 DIAGNOSIS — R338 Other retention of urine: Secondary | ICD-10-CM | POA: Diagnosis present

## 2019-03-05 DIAGNOSIS — R1312 Dysphagia, oropharyngeal phase: Secondary | ICD-10-CM | POA: Diagnosis not present

## 2019-03-05 DIAGNOSIS — I10 Essential (primary) hypertension: Secondary | ICD-10-CM | POA: Diagnosis not present

## 2019-03-05 DIAGNOSIS — G2581 Restless legs syndrome: Secondary | ICD-10-CM | POA: Diagnosis present

## 2019-03-05 DIAGNOSIS — I129 Hypertensive chronic kidney disease with stage 1 through stage 4 chronic kidney disease, or unspecified chronic kidney disease: Secondary | ICD-10-CM | POA: Diagnosis present

## 2019-03-05 DIAGNOSIS — Z823 Family history of stroke: Secondary | ICD-10-CM

## 2019-03-05 DIAGNOSIS — Z7189 Other specified counseling: Secondary | ICD-10-CM

## 2019-03-05 DIAGNOSIS — I4821 Permanent atrial fibrillation: Secondary | ICD-10-CM | POA: Diagnosis not present

## 2019-03-05 DIAGNOSIS — Z8711 Personal history of peptic ulcer disease: Secondary | ICD-10-CM

## 2019-03-05 LAB — COMPREHENSIVE METABOLIC PANEL
ALT: 23 U/L (ref 0–44)
AST: 22 U/L (ref 15–41)
Albumin: 3.3 g/dL — ABNORMAL LOW (ref 3.5–5.0)
Alkaline Phosphatase: 53 U/L (ref 38–126)
Anion gap: 9 (ref 5–15)
BUN: 35 mg/dL — ABNORMAL HIGH (ref 8–23)
CO2: 26 mmol/L (ref 22–32)
Calcium: 8.8 mg/dL — ABNORMAL LOW (ref 8.9–10.3)
Chloride: 102 mmol/L (ref 98–111)
Creatinine, Ser: 1.98 mg/dL — ABNORMAL HIGH (ref 0.61–1.24)
GFR calc Af Amer: 33 mL/min — ABNORMAL LOW (ref >60)
GFR calc non Af Amer: 28 mL/min — ABNORMAL LOW (ref >60)
Glucose, Bld: 120 mg/dL — ABNORMAL HIGH (ref 70–99)
Potassium: 4.2 mmol/L (ref 3.5–5.1)
Sodium: 137 mmol/L (ref 135–145)
Total Bilirubin: 0.6 mg/dL (ref 0.3–1.2)
Total Protein: 7.1 g/dL (ref 6.5–8.1)

## 2019-03-05 LAB — DIFFERENTIAL
Abs Immature Granulocytes: 0.1 10*3/uL — ABNORMAL HIGH (ref 0.00–0.07)
Basophils Absolute: 0.1 10*3/uL (ref 0.0–0.1)
Basophils Relative: 0 %
Eosinophils Absolute: 0.2 10*3/uL (ref 0.0–0.5)
Eosinophils Relative: 1 %
Immature Granulocytes: 1 %
Lymphocytes Relative: 8 %
Lymphs Abs: 0.9 10*3/uL (ref 0.7–4.0)
Monocytes Absolute: 0.9 10*3/uL (ref 0.1–1.0)
Monocytes Relative: 8 %
Neutro Abs: 9.5 10*3/uL — ABNORMAL HIGH (ref 1.7–7.7)
Neutrophils Relative %: 82 %

## 2019-03-05 LAB — CBC
HCT: 36.4 % — ABNORMAL LOW (ref 39.0–52.0)
Hemoglobin: 11.4 g/dL — ABNORMAL LOW (ref 13.0–17.0)
MCH: 30.1 pg (ref 26.0–34.0)
MCHC: 31.3 g/dL (ref 30.0–36.0)
MCV: 96 fL (ref 80.0–100.0)
Platelets: 199 10*3/uL (ref 150–400)
RBC: 3.79 MIL/uL — ABNORMAL LOW (ref 4.22–5.81)
RDW: 13.7 % (ref 11.5–15.5)
WBC: 11.6 10*3/uL — ABNORMAL HIGH (ref 4.0–10.5)
nRBC: 0 % (ref 0.0–0.2)

## 2019-03-05 LAB — PROTIME-INR
INR: 1.1 (ref 0.8–1.2)
Prothrombin Time: 14.1 seconds (ref 11.4–15.2)

## 2019-03-05 LAB — I-STAT CHEM 8, ED
BUN: 35 mg/dL — ABNORMAL HIGH (ref 8–23)
Calcium, Ion: 1.13 mmol/L — ABNORMAL LOW (ref 1.15–1.40)
Chloride: 103 mmol/L (ref 98–111)
Creatinine, Ser: 1.9 mg/dL — ABNORMAL HIGH (ref 0.61–1.24)
Glucose, Bld: 113 mg/dL — ABNORMAL HIGH (ref 70–99)
HCT: 33 % — ABNORMAL LOW (ref 39.0–52.0)
Hemoglobin: 11.2 g/dL — ABNORMAL LOW (ref 13.0–17.0)
Potassium: 4.2 mmol/L (ref 3.5–5.1)
Sodium: 138 mmol/L (ref 135–145)
TCO2: 28 mmol/L (ref 22–32)

## 2019-03-05 LAB — URINALYSIS, ROUTINE W REFLEX MICROSCOPIC
Bilirubin Urine: NEGATIVE
Glucose, UA: NEGATIVE mg/dL
Hgb urine dipstick: NEGATIVE
Ketones, ur: NEGATIVE mg/dL
Leukocytes,Ua: NEGATIVE
Nitrite: NEGATIVE
Protein, ur: NEGATIVE mg/dL
Specific Gravity, Urine: 1.012 (ref 1.005–1.030)
pH: 7 (ref 5.0–8.0)

## 2019-03-05 LAB — POC SARS CORONAVIRUS 2 AG -  ED: SARS Coronavirus 2 Ag: NEGATIVE

## 2019-03-05 LAB — RAPID URINE DRUG SCREEN, HOSP PERFORMED
Amphetamines: NOT DETECTED
Barbiturates: NOT DETECTED
Benzodiazepines: NOT DETECTED
Cocaine: NOT DETECTED
Opiates: NOT DETECTED
Tetrahydrocannabinol: NOT DETECTED

## 2019-03-05 LAB — CK: Total CK: 90 U/L (ref 49–397)

## 2019-03-05 LAB — APTT: aPTT: 30 seconds (ref 24–36)

## 2019-03-05 LAB — ETHANOL: Alcohol, Ethyl (B): <10 mg/dL (ref <10)

## 2019-03-05 MED ORDER — LORAZEPAM 2 MG/ML IJ SOLN
1.0000 mg | Freq: Once | INTRAMUSCULAR | Status: AC
  Administered 2019-03-05: 1 mg via INTRAVENOUS
  Filled 2019-03-05: qty 1

## 2019-03-05 MED ORDER — SODIUM CHLORIDE 0.9 % IV BOLUS
1000.0000 mL | Freq: Once | INTRAVENOUS | Status: AC
  Administered 2019-03-05: 1000 mL via INTRAVENOUS

## 2019-03-05 NOTE — ED Triage Notes (Addendum)
Pt arrived via [facility] from [facility] for c/o AMS and weakness. Pt normally able to walk around and talk. Per daughter pt usually able to say name. Pt has dementia at baseline. Presently pt has incomprehensible speech. Pt is A-fib on monitor. LKW 1730 today.
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
## 2019-03-05 NOTE — ED Notes (Signed)
Pt in MRI; IVF completed and saline locked.

## 2019-03-05 NOTE — Consult Note (Addendum)
Neurology Consultation  Reason for Consult: Speech problem Referring Physician: Dr. Silverio Lay  CC: Speech difficulty  History is obtained from: Chart review, patient's daughter  HPI: Corey Mcdonald is a 86 y.o. male past medical history of atrial fibrillation on anticoagulation with Eliquis, AAA, coronary artery disease hypertension, hyperlipidemia, macular degeneration with blindness in both eyes, peripheral vascular disease, sleep apnea, restless leg syndrome, dementia, at baseline using a walker to walk, was last known in his usual state of health at an independent living facility where he requires quite a good amount of supervision to be normal baseline at around 1 PM and when the aide went to check on him at 5, he was not talking much. At baseline he is able to talk and engage in a conversation although he has memory lapses and his finances and other important tasks have been managed by the daughter over the last 5 years. According to the daughter, he had not been sick more than any usual-at baseline has seasonal allergies sneezing and keeps clearing his throat and coughs. They are able to guide him to take his meals by directing him over the phone but it was getting difficult to do that this evening. He was evaluated by the emergency providers in the ER upon arrival.  No focal weakness was noted.  He appeared more encephalopathic. He was outside the window for IV TPA-and also on anticoagulation.  According to the daughter, he does have some sundowning at baseline.  LKW: 1 PM tpa given?: no, on anticoagulation, outside the window Premorbid modified Rankin scale (mRS):4  ROS: ROS was performed and is negative except as noted in the HPI.    Past Medical History:  Diagnosis Date  . AAA (abdominal aortic aneurysm) (HCC)   . ABDOMINAL AORTIC ANEURYSM   . Anemia January 2012   admitted with acute MI March 15, 2010 and has significant acute blood loss anemia requiring transfusions of two units  of packed RBCs.  Status post upper endoscopy March 22, 2010 without High-Risk bleeding lesion.  History of peptic ulcer disease  . Atrial fibrillation (HCC)   . BENIGN PROSTATIC HYPERTROPHY, HX OF    . COLONIC POLYPS, HX OF   . CORONARY ARTERY DISEASE    non-ST segment MI, March 15, 2010  . History of Doppler ultrasound 05/2011   h/o claudication, stable ABIs  . History of nuclear stress test 2007   persantine; normal, low risk   . HYPERLIPIDEMIA   . HYPERTENSION   . MACULAR DEGENERATION   . Myocardial infarction (HCC) 02/2010  . PAD (peripheral artery disease) (HCC)   . PEPTIC ULCER DISEASE, HX OF   . PVD (peripheral vascular disease) (HCC)   . RESTLESS LEGS SYNDROME   . SLEEP APNEA   . VERTIGO      Family History  Problem Relation Age of Onset  . Heart disease Mother   . Heart disease Father   . Stroke Father   . Heart disease Sister   . Macular degeneration Sister   . Heart disease Sister   . Stroke Brother   . Hypertension Sister   . Heart Problems Sister        x2    Social History:   reports that he quit smoking about 32 years ago. His smoking use included cigarettes. He has a 40.00 pack-year smoking history. He has never used smokeless tobacco. He reports that he does not drink alcohol or use drugs.   Medications No current facility-administered medications for this  encounter.  Current Outpatient Medications:  .  acetaminophen (TYLENOL) 325 MG tablet, Take 650 mg by mouth every 6 (six) hours as needed for moderate pain. , Disp: , Rfl:  .  apixaban (ELIQUIS) 2.5 MG TABS tablet, Take 1 tablet (2.5 mg total) by mouth 2 (two) times daily., Disp: 60 tablet, Rfl: 0 .  atorvastatin (LIPITOR) 20 MG tablet, Take 1 tablet by mouth once daily (Patient taking differently: Take 20 mg by mouth daily at 6 PM. ), Disp: 90 tablet, Rfl: 1 .  docusate sodium (COLACE) 100 MG capsule, Take 100 mg by mouth daily before supper. , Disp: , Rfl:  .  Ferrous Gluconate (IRON) 240 (27  Fe) MG TABS, Take 1 tablet by mouth at bedtime. , Disp: , Rfl:  .  furosemide (LASIX) 20 MG tablet, TAKE 2 TABLETS BY MOUTH ONCE DAILY IN THE MORNING AND 1 ONCE DAILY IN THE AFTERNOON (Patient taking differently: Take 20-40 mg by mouth See admin instructions. TAKE 2 TABLETS BY MOUTH ONCE DAILY IN THE MORNING AND 1 ONCE DAILY IN THE AFTERNOON), Disp: 270 tablet, Rfl: 1 .  metoprolol tartrate (LOPRESSOR) 25 MG tablet, Take 1 tablet by mouth twice daily (Patient taking differently: Take 25 mg by mouth 2 (two) times daily. ), Disp: 180 tablet, Rfl: 1 .  Multiple Vitamin (MULTIVITAMIN WITH MINERALS) TABS tablet, Take 1 tablet by mouth daily., Disp: , Rfl:  .  nitroGLYCERIN (NITROSTAT) 0.4 MG SL tablet, Place 1 tablet (0.4 mg total) under the tongue every 5 (five) minutes as needed. FOR CHEST PAIN (Patient taking differently: Place 0.4 mg under the tongue every 5 (five) minutes as needed for chest pain. ), Disp: 25 tablet, Rfl: 6 .  pantoprazole (PROTONIX) 40 MG tablet, TAKE 1 TABLET BY MOUTH ONCE DAILY (Patient taking differently: Take 40 mg by mouth daily. ), Disp: 90 tablet, Rfl: 3 .  tamsulosin (FLOMAX) 0.4 MG CAPS capsule, Take 1 capsule (0.4 mg total) by mouth every evening., Disp: 90 capsule, Rfl: 1 .  tobramycin (TOBREX) 0.3 % ophthalmic solution, Place 2 drops into both eyes every 4 (four) hours. (Patient taking differently: Place 2 drops into both eyes every 4 (four) hours as needed (for eye infection). ), Disp: 5 mL, Rfl: 0 .  apixaban (ELIQUIS) 2.5 MG TABS tablet, Take 1 tablet (2.5 mg total) by mouth 2 (two) times daily. (Patient not taking: Reported on 03/05/2019), Disp: 180 tablet, Rfl: 1 .  PRESCRIPTION MEDICATION, Inhale into the lungs See admin instructions. CPAP (pressure is set at 10): Use at bedtime and during all naps, Disp: , Rfl:    Exam: Current vital signs: BP 140/77   Pulse 83   Temp (!) 97.4 F (36.3 C) (Oral)   Resp 20   Ht 5\' 9"  (1.753 m)   Wt 88.5 kg   SpO2 100%   BMI  28.80 kg/m  Vital signs in last 24 hours: Temp:  [97.4 F (36.3 C)-98.1 F (36.7 C)] 97.4 F (36.3 C) (01/12 2214) Pulse Rate:  [83-101] 83 (01/12 2230) Resp:  [14-20] 20 (01/12 2230) BP: (140-155)/(74-91) 140/77 (01/12 2230) SpO2:  [97 %-100 %] 100 % (01/12 2230) Weight:  [88.5 kg] 88.5 kg (01/12 2038) General: Drowsy, in no distress HEENT: Normocephalic atraumatic, right eye with corneal abrasions and redness CVS: Irregularly irregular Respiratory: Scattered rales Abdomen: Nondistended nontender Extremities: Trace edema bilaterally Neurological exam He is drowsy, opens eyes to noxious stimulation. Does not follow commands Making gurgling sounds in response to all questions. Cranial  nerves: Pupils are equal round reactive to light sluggishly bilaterally, cannot close his right eyelid completely-has a history of chronic Bell's palsy listed in his prior neurology notes from 2017, also had a slight right facial droop-secondary to an old injury according to the daughter, did not blink to threat from either side. Motor exam: Able to withdraw to noxious stimulation in all fours. Sensory exam: As above Coordination difficult to assess due to current mentation NIH stroke scale 1a Level of Conscious.: 1 1b LOC Questions: 2 1c LOC Commands: 2 2 Best Gaze: 0 3 Visual: 0 4 Facial Palsy: 1 5a Motor Arm - left: 2 5b Motor Arm - Right: 2 6a Motor Leg - Left: 2 6b Motor Leg - Right: 2 7 Limb Ataxia: 0 8 Sensory: 0 9 Best Language: 3 10 Dysarthria: 2 11 Extinct. and Inatten.: 0 TOTAL: 19   Labs I have reviewed labs in epic and the results pertinent to this consultation are: Mild leukocytosis, creatinine 1.9, CBC    Component Value Date/Time   WBC 11.6 (H) 03/05/2019 2024   RBC 3.79 (L) 03/05/2019 2024   HGB 11.2 (L) 03/05/2019 2030   HGB 9.5 (L) 03/08/2018 1117   HCT 33.0 (L) 03/05/2019 2030   HCT 29.7 (L) 03/08/2018 1117   PLT 199 03/05/2019 2024   PLT 329 03/08/2018 1117    MCV 96.0 03/05/2019 2024   MCV 85 03/08/2018 1117   MCH 30.1 03/05/2019 2024   MCHC 31.3 03/05/2019 2024   RDW 13.7 03/05/2019 2024   RDW 14.5 03/08/2018 1117   LYMPHSABS 0.9 03/05/2019 2024   MONOABS 0.9 03/05/2019 2024   EOSABS 0.2 03/05/2019 2024   BASOSABS 0.1 03/05/2019 2024    CMP     Component Value Date/Time   NA 138 03/05/2019 2030   NA 136 03/08/2018 1112   K 4.2 03/05/2019 2030   CL 103 03/05/2019 2030   CO2 26 03/05/2019 2024   GLUCOSE 113 (H) 03/05/2019 2030   BUN 35 (H) 03/05/2019 2030   BUN 26 03/08/2018 1112   CREATININE 1.90 (H) 03/05/2019 2030   CREATININE 1.27 (H) 05/08/2015 1005   CALCIUM 8.8 (L) 03/05/2019 2024   PROT 7.1 03/05/2019 2024   ALBUMIN 3.3 (L) 03/05/2019 2024   AST 22 03/05/2019 2024   ALT 23 03/05/2019 2024   ALKPHOS 53 03/05/2019 2024   BILITOT 0.6 03/05/2019 2024   GFRNONAA 28 (L) 03/05/2019 2024   GFRAA 33 (L) 03/05/2019 2024  Urinalysis negative for UTI  Imaging I have reviewed the images obtained: CT-scan of the brain read as no acute intracranial hemorrhage, age-related atrophy. There is chronic white matter disease more so on the left hemisphere than right-cannot rule out an acute stroke. CT C-spine with no acute traumatic cervical spine pathology.  Degenerative changes noted.  Chest x-ray with cardiomegaly and mild vascular congestion.  No focal consolidation noted.  Assessment:  86 year old man past medical history of atrial fibrillation on anticoagulation with apixaban, AAA, coronary artery disease, hypertension, hyperlipidemia, macular degeneration with blindness in both eyes, peripheral vascular disease, sleep apnea, restless leg syndrome, dementia at baseline with some sundowning, at baseline uses a walker to walk, noted to be less responsive and unable to talk after last known well at 1 PM today. By the time he arrived in the ER-was outside the window for IV TPA, was also on anticoagulation hence not a candidate. Given  poor baseline modified Rankin-not a candidate for intervention. At this time, differentials include embolic strokes  versus toxic metabolic encephalopathy as the most likely etiology of his presentation. Worsening of dementia, with an acute behavioral component should also be considered as an etiology.   Recommendations: -MRI brain stat -MRA head stat -Check blood cultures -2D echo-given history of A. fib-rule out LV thrombus -Check an arterial blood gas -If the MRI is positive for stroke-then pursue risk factor work-up including A1c and lipid panel -Continue frequent neurochecks -Physical therapy -Occupational Therapy -Speech therapy -N.p.o. until cleared by bedside swallow evaluation   -- Amie Portland, MD [facility] Pager: [phone] If 7pm to 7am, please call on call as listed on AMION.  ADDENDUM Moderate size left frontal acute stroke on MRI. MRA head shows left M1 occlusion. Etiology likely cardioembolic given history of atrial fibrillation. Not a candidate for intervention due to poor baseline modified Rankin. Full stroke w/u including 2D echo, A1c, lipid panel. Hold Eliquis. Might need to switch DOAC. ASA 81 for now Other recommendations above. Stroke team will follow with you Plan was relayed to the daughter at bedside.  -- Amie Portland, MD [facility] Pager: [phone] If 7pm to 7am, please call on call as listed on AMION.

## 2019-03-06 ENCOUNTER — Inpatient Hospital Stay (HOSPITAL_COMMUNITY): Payer: Medicare Other

## 2019-03-06 DIAGNOSIS — Z20822 Contact with and (suspected) exposure to covid-19: Secondary | ICD-10-CM | POA: Diagnosis present

## 2019-03-06 DIAGNOSIS — I739 Peripheral vascular disease, unspecified: Secondary | ICD-10-CM | POA: Diagnosis present

## 2019-03-06 DIAGNOSIS — I639 Cerebral infarction, unspecified: Secondary | ICD-10-CM | POA: Diagnosis not present

## 2019-03-06 DIAGNOSIS — I482 Chronic atrial fibrillation, unspecified: Secondary | ICD-10-CM

## 2019-03-06 DIAGNOSIS — F0390 Unspecified dementia without behavioral disturbance: Secondary | ICD-10-CM | POA: Diagnosis present

## 2019-03-06 DIAGNOSIS — R1312 Dysphagia, oropharyngeal phase: Secondary | ICD-10-CM | POA: Diagnosis not present

## 2019-03-06 DIAGNOSIS — I350 Nonrheumatic aortic (valve) stenosis: Secondary | ICD-10-CM | POA: Diagnosis not present

## 2019-03-06 DIAGNOSIS — R402222 Coma scale, best verbal response, incomprehensible words, at arrival to emergency department: Secondary | ICD-10-CM | POA: Diagnosis present

## 2019-03-06 DIAGNOSIS — I4821 Permanent atrial fibrillation: Secondary | ICD-10-CM

## 2019-03-06 DIAGNOSIS — E785 Hyperlipidemia, unspecified: Secondary | ICD-10-CM | POA: Diagnosis present

## 2019-03-06 DIAGNOSIS — I63412 Cerebral infarction due to embolism of left middle cerebral artery: Secondary | ICD-10-CM | POA: Diagnosis present

## 2019-03-06 DIAGNOSIS — I129 Hypertensive chronic kidney disease with stage 1 through stage 4 chronic kidney disease, or unspecified chronic kidney disease: Secondary | ICD-10-CM | POA: Diagnosis present

## 2019-03-06 DIAGNOSIS — Z66 Do not resuscitate: Secondary | ICD-10-CM | POA: Diagnosis present

## 2019-03-06 DIAGNOSIS — I714 Abdominal aortic aneurysm, without rupture: Secondary | ICD-10-CM | POA: Diagnosis present

## 2019-03-06 DIAGNOSIS — N1832 Chronic kidney disease, stage 3b: Secondary | ICD-10-CM | POA: Diagnosis present

## 2019-03-06 DIAGNOSIS — R29810 Facial weakness: Secondary | ICD-10-CM | POA: Diagnosis present

## 2019-03-06 DIAGNOSIS — I361 Nonrheumatic tricuspid (valve) insufficiency: Secondary | ICD-10-CM

## 2019-03-06 DIAGNOSIS — Z515 Encounter for palliative care: Secondary | ICD-10-CM | POA: Diagnosis present

## 2019-03-06 DIAGNOSIS — G8191 Hemiplegia, unspecified affecting right dominant side: Secondary | ICD-10-CM | POA: Diagnosis present

## 2019-03-06 DIAGNOSIS — R4182 Altered mental status, unspecified: Secondary | ICD-10-CM

## 2019-03-06 DIAGNOSIS — G4733 Obstructive sleep apnea (adult) (pediatric): Secondary | ICD-10-CM | POA: Diagnosis present

## 2019-03-06 DIAGNOSIS — R402342 Coma scale, best motor response, flexion withdrawal, at arrival to emergency department: Secondary | ICD-10-CM | POA: Diagnosis present

## 2019-03-06 DIAGNOSIS — I10 Essential (primary) hypertension: Secondary | ICD-10-CM

## 2019-03-06 DIAGNOSIS — H3530 Unspecified macular degeneration: Secondary | ICD-10-CM | POA: Diagnosis present

## 2019-03-06 DIAGNOSIS — H543 Unqualified visual loss, both eyes: Secondary | ICD-10-CM | POA: Diagnosis present

## 2019-03-06 DIAGNOSIS — R4701 Aphasia: Secondary | ICD-10-CM | POA: Diagnosis present

## 2019-03-06 DIAGNOSIS — N401 Enlarged prostate with lower urinary tract symptoms: Secondary | ICD-10-CM | POA: Diagnosis present

## 2019-03-06 DIAGNOSIS — Z7189 Other specified counseling: Secondary | ICD-10-CM | POA: Diagnosis not present

## 2019-03-06 DIAGNOSIS — G9340 Encephalopathy, unspecified: Secondary | ICD-10-CM | POA: Diagnosis present

## 2019-03-06 DIAGNOSIS — F05 Delirium due to known physiological condition: Secondary | ICD-10-CM | POA: Diagnosis present

## 2019-03-06 DIAGNOSIS — R338 Other retention of urine: Secondary | ICD-10-CM | POA: Diagnosis present

## 2019-03-06 DIAGNOSIS — N179 Acute kidney failure, unspecified: Secondary | ICD-10-CM | POA: Diagnosis present

## 2019-03-06 DIAGNOSIS — E7800 Pure hypercholesterolemia, unspecified: Secondary | ICD-10-CM | POA: Diagnosis not present

## 2019-03-06 DIAGNOSIS — I2510 Atherosclerotic heart disease of native coronary artery without angina pectoris: Secondary | ICD-10-CM | POA: Diagnosis present

## 2019-03-06 DIAGNOSIS — G2581 Restless legs syndrome: Secondary | ICD-10-CM | POA: Diagnosis present

## 2019-03-06 LAB — LIPID PANEL
Cholesterol: 141 mg/dL (ref 0–200)
HDL: 37 mg/dL — ABNORMAL LOW (ref >40)
LDL Cholesterol: 83 mg/dL (ref 0–99)
Total CHOL/HDL Ratio: 3.8 RATIO
Triglycerides: 105 mg/dL (ref <150)
VLDL: 21 mg/dL (ref 0–40)

## 2019-03-06 LAB — HEMOGLOBIN A1C
Hgb A1c MFr Bld: 6.2 % — ABNORMAL HIGH (ref 4.8–5.6)
Mean Plasma Glucose: 131.24 mg/dL

## 2019-03-06 LAB — URINALYSIS, COMPLETE (UACMP) WITH MICROSCOPIC
Bacteria, UA: NONE SEEN
Bilirubin Urine: NEGATIVE
Glucose, UA: NEGATIVE mg/dL
Ketones, ur: NEGATIVE mg/dL
Nitrite: NEGATIVE
Protein, ur: NEGATIVE mg/dL
RBC / HPF: >50 RBC/hpf — ABNORMAL HIGH (ref 0–5)
Specific Gravity, Urine: 1.017 (ref 1.005–1.030)
pH: 6 (ref 5.0–8.0)

## 2019-03-06 LAB — URINE CULTURE: Culture: NO GROWTH

## 2019-03-06 LAB — GLUCOSE, CAPILLARY: Glucose-Capillary: 91 mg/dL (ref 70–99)

## 2019-03-06 LAB — ECHOCARDIOGRAM COMPLETE
Height: 69 in
Weight: 3120 oz

## 2019-03-06 LAB — POCT I-STAT 7, (LYTES, BLD GAS, ICA,H+H)
Bicarbonate: 24.8 mmol/L (ref 20.0–28.0)
Calcium, Ion: 1.22 mmol/L (ref 1.15–1.40)
HCT: 32 % — ABNORMAL LOW (ref 39.0–52.0)
Hemoglobin: 10.9 g/dL — ABNORMAL LOW (ref 13.0–17.0)
O2 Saturation: 93 %
Patient temperature: 97.4
Potassium: 3.6 mmol/L (ref 3.5–5.1)
Sodium: 140 mmol/L (ref 135–145)
TCO2: 26 mmol/L (ref 22–32)
pCO2 arterial: 36.7 mmHg (ref 32.0–48.0)
pH, Arterial: 7.434 (ref 7.350–7.450)
pO2, Arterial: 63 mmHg — ABNORMAL LOW (ref 83.0–108.0)

## 2019-03-06 LAB — SARS CORONAVIRUS 2 (TAT 6-24 HRS): SARS Coronavirus 2: NEGATIVE

## 2019-03-06 LAB — CBG MONITORING, ED: Glucose-Capillary: 106 mg/dL — ABNORMAL HIGH (ref 70–99)

## 2019-03-06 MED ORDER — TAMSULOSIN HCL 0.4 MG PO CAPS: 0.4000 mg | ORAL_CAPSULE | Freq: Every evening | ORAL | Status: DC

## 2019-03-06 MED ORDER — SODIUM CHLORIDE 0.9 % IV SOLN: INTRAVENOUS | Status: DC

## 2019-03-06 MED ORDER — STROKE: EARLY STAGES OF RECOVERY BOOK: Freq: Once | Status: DC

## 2019-03-06 MED ORDER — TOBRAMYCIN 0.3 % OP SOLN
2.0000 [drp] | OPHTHALMIC | Status: DC | PRN
  Filled 2019-03-06: qty 5

## 2019-03-06 MED ORDER — ATORVASTATIN CALCIUM 40 MG PO TABS: 40.0000 mg | ORAL_TABLET | Freq: Every day | ORAL | Status: DC

## 2019-03-06 MED ORDER — ACETAMINOPHEN 650 MG RE SUPP: 650.0000 mg | RECTAL | Status: DC | PRN

## 2019-03-06 MED ORDER — ASPIRIN 81 MG PO CHEW: 81.0000 mg | CHEWABLE_TABLET | Freq: Every day | ORAL | Status: DC

## 2019-03-06 MED ORDER — PANTOPRAZOLE SODIUM 40 MG PO TBEC
40.0000 mg | DELAYED_RELEASE_TABLET | Freq: Every day | ORAL | Status: DC
  Administered 2019-03-06: 40 mg via ORAL
  Filled 2019-03-06: qty 1

## 2019-03-06 MED ORDER — ASPIRIN 300 MG RE SUPP
300.0000 mg | Freq: Every day | RECTAL | Status: DC
  Administered 2019-03-07 – 2019-03-08 (×2): 300 mg via RECTAL
  Filled 2019-03-06 (×2): qty 1

## 2019-03-06 MED ORDER — ACETAMINOPHEN 325 MG PO TABS: 650.0000 mg | ORAL_TABLET | ORAL | Status: DC | PRN

## 2019-03-06 MED ORDER — ASPIRIN EC 325 MG PO TBEC
325.0000 mg | DELAYED_RELEASE_TABLET | Freq: Every day | ORAL | Status: DC
  Administered 2019-03-06: 325 mg via ORAL
  Filled 2019-03-06: qty 1

## 2019-03-06 MED ORDER — SENNOSIDES-DOCUSATE SODIUM 8.6-50 MG PO TABS
1.0000 | ORAL_TABLET | Freq: Every evening | ORAL | Status: DC | PRN
Start: 2019-03-06 — End: 2019-03-09

## 2019-03-06 MED ORDER — ATORVASTATIN CALCIUM 80 MG PO TABS: 80.0000 mg | ORAL_TABLET | Freq: Every day | ORAL | Status: DC

## 2019-03-06 MED ORDER — ACETAMINOPHEN 160 MG/5ML PO SOLN: 650.0000 mg | ORAL | Status: DC | PRN

## 2019-03-06 MED ORDER — DOCUSATE SODIUM 100 MG PO CAPS: 100.0000 mg | ORAL_CAPSULE | Freq: Every day | ORAL | Status: DC

## 2019-03-06 NOTE — ED Notes (Signed)
RN called portable for SCDs

## 2019-03-06 NOTE — ED Notes (Signed)
Pt's DTR talked with MD and RN. She states that she would not want tube feedings if he cannot swallow and would like to get him closer to [facility] where she lives upon discharge.

## 2019-03-06 NOTE — Progress Notes (Signed)
Patient in and out cath. 450 cc urine returned

## 2019-03-06 NOTE — Progress Notes (Signed)
  Echocardiogram 2D Echocardiogram has been performed.  Corey Mcdonald Piotr Christopher 03/06/2019, 10:30 AM

## 2019-03-06 NOTE — ED Notes (Signed)
Speech at bedside to assess. She is now updating dtr via phone.

## 2019-03-06 NOTE — ED Notes (Signed)
MD at bedside to assess. Will follow up with family regarding goals of care

## 2019-03-06 NOTE — H&P (Signed)
History and Physical  Corey Mcdonald ZDG:[id number] DOB: 1924/02/18 DOA: 03/05/2019  Referring physician: ER provider PCP: Shirline Frees, NP  Outpatient Specialists:    Patient coming from: Assisted living facility  Chief Complaint: Aphasia  HPI:  Patient is a 86 year old Caucasian male with past medical history significant for coronary artery disease, atrial fibrillation on Eliquis 2.5 Mg p.o. twice daily, abdominal aortic aneurysm, chronic kidney disease stage IIIb, peptic ulcer disease, PVD, MI, restless leg syndrome, sleep apnea, hypertension and hyperlipidemia.  Patient is a resident at an assisted living facility.  Patient's carer observed the patient was no longer able to speak earlier today (around 5 PM).  Patient was also noted to be awake.  EMR was activated.  On presentation to the hospital, CT head did not reveal any acute findings.  MRI brain, as per neurology note, a CT revealed moderate size left frontal acute stroke.  Formal reading from the radiology team is awaited.  Patient is unable to give any history.  Patient be admitted for further assessment and management.  ED Course: On presentation to the emergency room, temperature is 97.4, blood pressure 140/77, heart rate of 83, respiratory rate of 20 with O2 sat of 100% on room air.  Work-up for the acute CVA's in progress.  Pertinent labs: CBC reveals WBC of 11.6, hemoglobin of 11.4, hematocrit of 36.4, MCV of 96 with platelet counts of 199.  Chemistry reveals sodium of 137, potassium of 4.2, chloride 102, CO2 of 26, BUN of 35, creatinine of 1.98, with a blood sugar of 113.  Imaging: independently reviewed.   Review of Systems:  Not obtainable.  Past Medical History:  Diagnosis Date  . AAA (abdominal aortic aneurysm) (HCC)   . ABDOMINAL AORTIC ANEURYSM   . Anemia January 2012   admitted with acute MI March 15, 2010 and has significant acute blood loss anemia requiring transfusions of two units of packed RBCs.  Status post  upper endoscopy March 22, 2010 without High-Risk bleeding lesion.  History of peptic ulcer disease  . Atrial fibrillation (HCC)   . BENIGN PROSTATIC HYPERTROPHY, HX OF    . COLONIC POLYPS, HX OF   . CORONARY ARTERY DISEASE    non-ST segment MI, March 15, 2010  . History of Doppler ultrasound 05/2011   h/o claudication, stable ABIs  . History of nuclear stress test 2007   persantine; normal, low risk   . HYPERLIPIDEMIA   . HYPERTENSION   . MACULAR DEGENERATION   . Myocardial infarction (HCC) 02/2010  . PAD (peripheral artery disease) (HCC)   . PEPTIC ULCER DISEASE, HX OF   . PVD (peripheral vascular disease) (HCC)   . RESTLESS LEGS SYNDROME   . SLEEP APNEA   . VERTIGO     Past Surgical History:  Procedure Laterality Date  . ABDOMINAL AORTIC ANEURYSM REPAIR     stenting   . CARDIAC CATHETERIZATION  02/2010   r/t NSTEMI; loss of SVG to RCA  . CATARACT EXTRACTION    . COLONOSCOPY WITH PROPOFOL N/A 02/04/2015   Procedure: COLONOSCOPY WITH PROPOFOL;  Surgeon: Charlott Rakes, MD;  Location: [facility] ENDOSCOPY;  Service: Endoscopy;  Laterality: N/A;  . CORONARY ARTERY BYPASS GRAFT  12/1995   x5; LIMA to ALD, vein graft to RCA (now occluded),SVG to OM, SVG to diagonal  . ESOPHAGOGASTRODUODENOSCOPY N/A 01/28/2015   Procedure: ESOPHAGOGASTRODUODENOSCOPY (EGD);  Surgeon: Vida Rigger, MD;  Location: [facility] ENDOSCOPY;  Service: Endoscopy;  Laterality: N/A;  . ESOPHAGOGASTRODUODENOSCOPY (EGD) WITH PROPOFOL N/A 02/10/2018  Procedure: ESOPHAGOGASTRODUODENOSCOPY (EGD) WITH PROPOFOL;  Surgeon: Bernette RedbirdBuccini, Robert, MD;  Location: [facility] ENDOSCOPY;  Service: Endoscopy;  Laterality: N/A;  . INGUINAL HERNIA REPAIR     Left inguinal  . TRANSTHORACIC ECHOCARDIOGRAM  04/2011   EF 55-60%; mild LVH; grade 1 diastolic dysfunction     reports that he quit smoking about 32 years ago. His smoking use included cigarettes. He has a 40.00 pack-year smoking history. He has never used smokeless tobacco. He reports that he does  not drink alcohol or use drugs.  Allergies  Allergen Reactions  . Cilostazol Other (See Comments)    Adverse reaction per patient - leg cramps  . Doxazosin Other (See Comments)    Leg cramps    Family History  Problem Relation Age of Onset  . Heart disease Mother   . Heart disease Father   . Stroke Father   . Heart disease Sister   . Macular degeneration Sister   . Heart disease Sister   . Stroke Brother   . Hypertension Sister   . Heart Problems Sister        x2     Prior to Admission medications   Medication Sig Start Date End Date Taking? Authorizing Provider  apixaban (ELIQUIS) 2.5 MG TABS tablet Take 1 tablet (2.5 mg total) by mouth 2 (two) times daily. 02/11/18  Yes Leroy SeaSingh, Prashant K, MD  atorvastatin (LIPITOR) 20 MG tablet Take 1 tablet by mouth once daily Patient taking differently: Take 20 mg by mouth daily at 6 PM.  02/21/19  Yes Nafziger, Kandee Keenory, NP  docusate sodium (COLACE) 100 MG capsule Take 100 mg by mouth daily before supper.    Yes [provider]  Ferrous Gluconate (IRON) 240 (27 Fe) MG TABS Take 1 tablet by mouth at bedtime.    Yes [provider]  furosemide (LASIX) 20 MG tablet TAKE 2 TABLETS BY MOUTH ONCE DAILY IN THE MORNING AND 1 ONCE DAILY IN THE AFTERNOON Patient taking differently: Take 20-40 mg by mouth See admin instructions. TAKE 2 TABLETS BY MOUTH ONCE DAILY IN THE MORNING AND 1 ONCE DAILY IN THE AFTERNOON 01/18/19  Yes Hilty, Lisette AbuKenneth C, MD  metoprolol tartrate (LOPRESSOR) 25 MG tablet Take 1 tablet by mouth twice daily Patient taking differently: Take 25 mg by mouth 2 (two) times daily.  10/03/18  Yes Rollene RotundaHochrein, James, MD  Multiple Vitamin (MULTIVITAMIN WITH MINERALS) TABS tablet Take 1 tablet by mouth daily.   Yes [provider]  nitroGLYCERIN (NITROSTAT) 0.4 MG SL tablet Place 1 tablet (0.4 mg total) under the tongue every 5 (five) minutes as needed. FOR CHEST PAIN Patient taking differently: Place 0.4 mg under the tongue  every 5 (five) minutes as needed for chest pain.  01/20/15  Yes Hilty, Lisette AbuKenneth C, MD  pantoprazole (PROTONIX) 40 MG tablet TAKE 1 TABLET BY MOUTH ONCE DAILY Patient taking differently: Take 40 mg by mouth daily.  04/12/18  Yes Hilty, Lisette AbuKenneth C, MD  tamsulosin (FLOMAX) 0.4 MG CAPS capsule Take 1 capsule (0.4 mg total) by mouth every evening. 01/22/19  Yes Nafziger, Kandee Keenory, NP  tobramycin (TOBREX) 0.3 % ophthalmic solution Place 2 drops into both eyes every 4 (four) hours. Patient taking differently: Place 2 drops into both eyes every 4 (four) hours as needed (for eye infection).  08/02/18  Yes Nafziger, Kandee Keenory, NP  apixaban (ELIQUIS) 2.5 MG TABS tablet Take 1 tablet (2.5 mg total) by mouth 2 (two) times daily. Patient not taking: Reported on 03/05/2019 01/09/19   Hilty,  Lisette Abu, MD  PRESCRIPTION MEDICATION Inhale into the lungs See admin instructions. CPAP (pressure is set at 10): Use at bedtime and during all naps    [provider]    Physical Exam: Vitals:   03/05/19 2038 03/05/19 2045 03/05/19 2214 03/05/19 2230  BP:  (!) 155/91 (!) 152/85 140/77  Pulse:  (!) 101 97 83  Resp:  15 17 20   Temp:   (!) 97.4 F (36.3 C)   TempSrc:   Oral   SpO2:  97% 99% 100%  Weight: 88.5 kg     Height: 5\' 9"  (1.753 m)       Constitutional:  . Elderly.  Appears calm and comfortable Eyes:  . No pallor. No jaundice.  ENMT:  . external ears, nose appear normal Neck:  . Neck is supple. No JVD Respiratory:  . CTA bilaterally, no w/r/r.  . Respiratory effort normal. No retractions or accessory muscle use Cardiovascular:  . S1S2, ESM  . Right LE extremity edema   Abdomen:  . Abdomen is soft and non tender. Organs are difficult to assess. Neurologic:  . Awake. Aphasic . Moves all limbs.  Wt Readings from Last 3 Encounters:  03/05/19 88.5 kg  03/12/18 88.8 kg  02/16/18 91.6 kg    I have personally reviewed following labs and imaging studies  Labs on Admission:  CBC: Recent Labs    Lab 03/05/19 2024 03/05/19 2030 03/06/19 0108  WBC 11.6*  --   --   NEUTROABS 9.5*  --   --   HGB 11.4* 11.2* 10.9*  HCT 36.4* 33.0* 32.0*  MCV 96.0  --   --   PLT 199  --   --    Basic Metabolic Panel: Recent Labs  Lab 03/05/19 2024 03/05/19 2030 03/06/19 0108  NA 137 138 140  K 4.2 4.2 3.6  CL 102 103  --   CO2 26  --   --   GLUCOSE 120* 113*  --   BUN 35* 35*  --   CREATININE 1.98* 1.90*  --   CALCIUM 8.8*  --   --    Liver Function Tests: Recent Labs  Lab 03/05/19 2024  AST 22  ALT 23  ALKPHOS 53  BILITOT 0.6  PROT 7.1  ALBUMIN 3.3*   No results for input(s): LIPASE, AMYLASE in the last 168 hours. No results for input(s): AMMONIA in the last 168 hours. Coagulation Profile: Recent Labs  Lab 03/05/19 2024  INR 1.1   Cardiac Enzymes: Recent Labs  Lab 03/05/19 2024  CKTOTAL 90   BNP (last 3 results) No results for input(s): PROBNP in the last 8760 hours. HbA1C: No results for input(s): HGBA1C in the last 72 hours. CBG: No results for input(s): GLUCAP in the last 168 hours. Lipid Profile: No results for input(s): CHOL, HDL, LDLCALC, TRIG, CHOLHDL, LDLDIRECT in the last 72 hours. Thyroid Function Tests: No results for input(s): TSH, T4TOTAL, FREET4, T3FREE, THYROIDAB in the last 72 hours. Anemia Panel: No results for input(s): VITAMINB12, FOLATE, FERRITIN, TIBC, IRON, RETICCTPCT in the last 72 hours. Urine analysis:    Component Value Date/Time   COLORURINE YELLOW 03/05/2019 2100   APPEARANCEUR CLEAR 03/05/2019 2100   LABSPEC 1.012 03/05/2019 2100   PHURINE 7.0 03/05/2019 2100   GLUCOSEU NEGATIVE 03/05/2019 2100   HGBUR NEGATIVE 03/05/2019 2100   HGBUR negative 09/03/2008 1110   BILIRUBINUR NEGATIVE 03/05/2019 2100   BILIRUBINUR n 12/18/2017 1618   KETONESUR NEGATIVE 03/05/2019 2100   PROTEINUR  NEGATIVE 03/05/2019 2100   UROBILINOGEN 0.2 12/18/2017 1618   UROBILINOGEN 0.2 12/17/2010 1123   NITRITE NEGATIVE 03/05/2019 2100   LEUKOCYTESUR  NEGATIVE 03/05/2019 2100   Sepsis Labs: @LABRCNTIP (procalcitonin:4,lacticidven:4) )No results found for this or any previous visit (from the past 240 hour(s)).    Radiological Exams on Admission: DG Pelvis 1-2 Views  Result Date: 03/05/2019 CLINICAL DATA:  Fall, no history of prior injuries or surgery EXAM: PELVIS - 1-2 VIEW COMPARISON:  Angiography abdomen and pelvis 08/06/2013 FINDINGS: The osseous structures appear diffusely demineralized which may limit detection of small or nondisplaced fractures. Bones of the pelvis appear intact and congruent. No abnormal diastasis of the SI joints or symphysis pubis. Femoral heads are normally located. No proximal femoral fractures are seen. Extensive vascular calcium is noted with aortoiliac stent reconstruction. Remaining soft tissues are unremarkable. IMPRESSION: 1. Diffuse osseous demineralization which may limit detection of small or nondisplaced fractures. 2. No osseous abnormality visualized. 3. Extensive atherosclerosis.  Prior aortoiliac repair. Electronically Signed   By: Lovena Le M.D.   On: 03/05/2019 22:07   CT HEAD WO CONTRAST  Result Date: 03/05/2019 CLINICAL DATA:  86 year old male with dementia and altered mental status. Head trauma. EXAM: CT HEAD WITHOUT CONTRAST CT CERVICAL SPINE WITHOUT CONTRAST TECHNIQUE: Multidetector CT imaging of the head and cervical spine was performed following the standard protocol without intravenous contrast. Multiplanar CT image reconstructions of the cervical spine were also generated. COMPARISON:  Head CT dated 01/09/2016. FINDINGS: CT HEAD FINDINGS Brain: There is mild age-related atrophy and chronic microvascular ischemic changes. There is no acute intracranial hemorrhage. No mass effect midline shift. No extra-axial fluid collection. Vascular: No hyperdense vessel or unexpected calcification. Skull: Normal. Negative for fracture or focal lesion. Sinuses/Orbits: Mild mucoperiosteal thickening of paranasal  sinuses. No air-fluid level. The mastoid air cells are clear. Other: None CT CERVICAL SPINE FINDINGS Alignment: No acute subluxation. Skull base and vertebrae: No acute fracture. Osteopenia. There is lead bony fusion of the posterior ring of C1. Soft tissues and spinal canal: No prevertebral fluid or swelling. No visible canal hematoma. Disc levels: Multilevel degenerative changes with endplate irregularity and disc space narrowing. Upper chest: Emphysema. Other: Bilateral carotid bulb calcified plaques. IMPRESSION: 1. No acute intracranial hemorrhage. Age-related atrophy and chronic microvascular ischemic changes. 2. No acute/traumatic cervical spine pathology. Degenerative changes. Electronically Signed   By: Anner Crete M.D.   On: 03/05/2019 22:11   CT Cervical Spine Wo Contrast  Result Date: 03/05/2019 CLINICAL DATA:  86 year old male with dementia and altered mental status. Head trauma. EXAM: CT HEAD WITHOUT CONTRAST CT CERVICAL SPINE WITHOUT CONTRAST TECHNIQUE: Multidetector CT imaging of the head and cervical spine was performed following the standard protocol without intravenous contrast. Multiplanar CT image reconstructions of the cervical spine were also generated. COMPARISON:  Head CT dated 01/09/2016. FINDINGS: CT HEAD FINDINGS Brain: There is mild age-related atrophy and chronic microvascular ischemic changes. There is no acute intracranial hemorrhage. No mass effect midline shift. No extra-axial fluid collection. Vascular: No hyperdense vessel or unexpected calcification. Skull: Normal. Negative for fracture or focal lesion. Sinuses/Orbits: Mild mucoperiosteal thickening of paranasal sinuses. No air-fluid level. The mastoid air cells are clear. Other: None CT CERVICAL SPINE FINDINGS Alignment: No acute subluxation. Skull base and vertebrae: No acute fracture. Osteopenia. There is lead bony fusion of the posterior ring of C1. Soft tissues and spinal canal: No prevertebral fluid or swelling. No  visible canal hematoma. Disc levels: Multilevel degenerative changes with endplate irregularity and disc space  narrowing. Upper chest: Emphysema. Other: Bilateral carotid bulb calcified plaques. IMPRESSION: 1. No acute intracranial hemorrhage. Age-related atrophy and chronic microvascular ischemic changes. 2. No acute/traumatic cervical spine pathology. Degenerative changes. Electronically Signed   By: Elgie Collard M.D.   On: 03/05/2019 22:11   DG Chest Port 1 View  Result Date: 03/05/2019 CLINICAL DATA:  86 year old male with altered mental status. EXAM: PORTABLE CHEST 1 VIEW COMPARISON:  Chest CT dated 05/10/2011. FINDINGS: There is mild cardiomegaly with vascular congestion. No focal consolidation, pleural effusion, or pneumothorax. Median sternotomy wires and CABG vascular clips. Atherosclerotic calcification of the aorta. No acute osseous pathology. IMPRESSION: Cardiomegaly with mild vascular congestion.  No focal consolidation. Electronically Signed   By: Elgie Collard M.D.   On: 03/05/2019 21:19   DG Abd Portable 1 View  Result Date: 03/05/2019 CLINICAL DATA:  MRI clearance EXAM: PORTABLE ABDOMEN - 1 VIEW COMPARISON:  December 29, 2010 FINDINGS: Aorto bi-iliac stent graft is seen. Surgical clips in the left upper quadrant. The bowel gas pattern is normal. No radio-opaque calculi or other significant radiographic abnormality are seen. IMPRESSION: Nonobstructive bowel gas pattern. Aorto bi-iliac stent graft and surgical clips in the left upper quadrant. Electronically Signed   By: Jonna Clark M.D.   On: 03/05/2019 23:18    Active Problems:   Acute CVA (cerebrovascular accident) [facility])   Assessment/Plan Likely Acute CVA: -Admit patient for further assessment and management -Neurology input is appreciated -MRI/MRA Brain done (See above) -Neurology has recommended aspirin 81 mg po once daily, and for Apixaban to be held -Stroke pathway -Patient is DNR -Guarded  prognosis  Dementia: No behavioral problems Continue to manage expectantly  Hypertension: -Permissive hypertension  Hyperlipidemia: -Optimize statin  Atrial fibrillation: -Control rate -Apixaban on hold  CKD 3B: -Stable -Continue to monitor  History of CAD/MI: Stable No chest pain reported  DVT prophylaxis:SCD Code Status: DNR Family Communication: Daughter Disposition Plan: Will depend on hospital course   Consults called: Neurology   Admission status: Inpatient    Time spent: 65 minutes.  Berton Mount, MD  [facility] Pager #: [phone] 7PM-7AM contact night coverage as above  03/06/2019, 1:50 AM

## 2019-03-06 NOTE — Evaluation (Signed)
Physical Therapy Evaluation Patient Details Name: Corey Mcdonald MRN: [id number] DOB: [birth year] Today's Date: 03/06/2019   History of Present Illness  Patient is a 86 year old Caucasian male with past medical history significant for coronary artery disease, atrial fibrillation on Eliquis, abdominal aortic aneurysm, chronic kidney disease stage IIIb, peptic ulcer disease, PVD, MI, restless leg syndrome, sleep apnea, hypertension and hyperlipidemia, macular degeneration, legally blind.  Admitted with decreased verbal responses, CT positive for L frontal acute CVA.  Clinical Impression  Patient presents with decreased mobility due to decreased R side awareness, decreased arousal, decreased strength and decreased communication.  Limited in bed evaluation due to lethargy though daughter reports pt sleeps a lot at home.  Feel he may benefit from skilled PT in the acute setting and possibly follow up PT at SNF level of care depending on progress and prognosis.      Follow Up Recommendations SNF    Equipment Recommendations  None recommended by PT    Recommendations for Other Services       Precautions / Restrictions Precautions Precautions: Fall Precaution Comments: R inattention, L gaze      Mobility  Bed Mobility               General bed mobility comments: NT pt too lethargic to participate  Transfers                    Ambulation/Gait                Stairs            Wheelchair Mobility    Modified Rankin (Stroke Patients Only) Modified Rankin (Stroke Patients Only) Pre-Morbid Rankin Score: Moderate disability Modified Rankin: Severe disability     Balance                                             Pertinent Vitals/Pain Pain Assessment: Faces Faces Pain Scale: No hurt    Home Living Family/patient expects to be discharged to:: Private residence Living Arrangements: Alone Available Help at Discharge: Family;Available  PRN/intermittently Type of Home: Independent living facility Home Access: Level entry     Home Layout: One level Home Equipment: Walker - 2 wheels;Bedside commode;Grab bars - tub/shower;Grab bars - toilet;Cane - single point      Prior Function Level of Independence: Independent with assistive device(s)         Comments: uses walker, has one meal delivered a day and can retrieve and set up on his own, gets his own breakfast which family set up for him, has caregiver 7 d/week 5-8:30 due to sundowning and help with dinner; sleeps a lot     Hand Dominance        Extremity/Trunk Assessment   Upper Extremity Assessment Upper Extremity Assessment: LUE deficits/detail;RUE deficits/detail RUE Deficits / Details: moves some on his own, but limited lifting possibly due to decreased attention versus decreased strength, LUE Deficits / Details: AROM generally WFL, strength shoulder flexion 4-/5, elbow flexion 4-/5    Lower Extremity Assessment Lower Extremity Assessment: LLE deficits/detail;RLE deficits/detail RLE Deficits / Details: AAROM WFL, strength hip flexion less than antigravity, but moves some again possibly weakness versus inattention LLE Deficits / Details: AROM WFL, strength hip flexion 3+/5, knee extension 4-/5       Communication   Communication: Expressive difficulties(expressive aphasia)  Cognition Arousal/Alertness: Lethargic Behavior During  Therapy: WFL for tasks assessed/performed Overall Cognitive Status: Difficult to assess                                 General Comments: follows one step commands with increased time and repitition due to lethargy      General Comments General comments (skin integrity, edema, etc.): Daughter in room and supportive with helping to get pt awake enough to participate in strength/ROM assessment in the bed    Exercises     Assessment/Plan    PT Assessment Patient needs continued PT services  PT Problem List  Decreased strength;Decreased activity tolerance;Decreased mobility;Decreased balance;Decreased knowledge of use of DME;Impaired sensation       PT Treatment Interventions Patient/family education;Therapeutic exercise;Functional mobility training;Gait training;Therapeutic activities;Balance training;DME instruction    PT Goals (Current goals can be found in the Care Plan section)  Acute Rehab PT Goals Patient Stated Goal: To get up when he can PT Goal Formulation: With family Time For Goal Achievement: 03/20/19 Potential to Achieve Goals: Fair    Frequency Min 3X/week   Barriers to discharge        Co-evaluation               AM-PAC PT "6 Clicks" Mobility  Outcome Measure Help needed turning from your back to your side while in a flat bed without using bedrails?: Total Help needed moving from lying on your back to sitting on the side of a flat bed without using bedrails?: Total Help needed moving to and from a bed to a chair (including a wheelchair)?: Total Help needed standing up from a chair using your arms (e.g., wheelchair or bedside chair)?: Total Help needed to walk in hospital room?: Total Help needed climbing 3-5 steps with a railing? : Total 6 Click Score: 6    End of Session   Activity Tolerance: Patient limited by lethargy Patient left: in bed;with family/visitor present Nurse Communication: Mobility status PT Visit Diagnosis: Other abnormalities of gait and mobility (R26.89);Other symptoms and signs involving the nervous system (R29.898)    Time: 1045-1101 PT Time Calculation (min) (ACUTE ONLY): 16 min   Charges:   PT Evaluation $PT Eval High Complexity: 1 High          Magda Kiel, [place] [facility] [phone] 03/06/2019   Reginia Naas 03/06/2019, 12:32 PM

## 2019-03-06 NOTE — ED Notes (Signed)
Bladder scan showed greater than 600. RN performed in and out and obtained 625cc of clear yellow urine. Pt tolerated well Oral care performed with swab and chap stick and eyes cleaned of dried mucous.

## 2019-03-06 NOTE — ED Notes (Addendum)
SWOT to transport; dtr updated with information.

## 2019-03-06 NOTE — Evaluation (Signed)
Clinical/Bedside Swallow Evaluation Patient Details  Name: Corey Mcdonald MRN: [id number] Date of Birth: [birth year]  Today's Date: 03/06/2019 Time: SLP Start Time (ACUTE ONLY): 1140 SLP Stop Time (ACUTE ONLY): 1158 SLP Time Calculation (min) (ACUTE ONLY): 18 min  Past Medical History:  Past Medical History:  Diagnosis Date  . AAA (abdominal aortic aneurysm) (HCC)   . ABDOMINAL AORTIC ANEURYSM   . Anemia January 2012   admitted with acute MI March 15, 2010 and has significant acute blood loss anemia requiring transfusions of two units of packed RBCs.  Status post upper endoscopy March 22, 2010 without High-Risk bleeding lesion.  History of peptic ulcer disease  . Atrial fibrillation (HCC)   . BENIGN PROSTATIC HYPERTROPHY, HX OF    . COLONIC POLYPS, HX OF   . CORONARY ARTERY DISEASE    non-ST segment MI, March 15, 2010  . History of Doppler ultrasound 05/2011   h/o claudication, stable ABIs  . History of nuclear stress test 2007   persantine; normal, low risk   . HYPERLIPIDEMIA   . HYPERTENSION   . MACULAR DEGENERATION   . Myocardial infarction (HCC) 02/2010  . PAD (peripheral artery disease) (HCC)   . PEPTIC ULCER DISEASE, HX OF   . PVD (peripheral vascular disease) (HCC)   . RESTLESS LEGS SYNDROME   . SLEEP APNEA   . VERTIGO    Past Surgical History:  Past Surgical History:  Procedure Laterality Date  . ABDOMINAL AORTIC ANEURYSM REPAIR     stenting   . CARDIAC CATHETERIZATION  02/2010   r/t NSTEMI; loss of SVG to RCA  . CATARACT EXTRACTION    . COLONOSCOPY WITH PROPOFOL N/A 02/04/2015   Procedure: COLONOSCOPY WITH PROPOFOL;  Surgeon: Charlott Rakes, MD;  Location: [facility] ENDOSCOPY;  Service: Endoscopy;  Laterality: N/A;  . CORONARY ARTERY BYPASS GRAFT  12/1995   x5; LIMA to ALD, vein graft to RCA (now occluded),SVG to OM, SVG to diagonal  . ESOPHAGOGASTRODUODENOSCOPY N/A 01/28/2015   Procedure: ESOPHAGOGASTRODUODENOSCOPY (EGD);  Surgeon: Vida Rigger, MD;  Location: [facility]  ENDOSCOPY;  Service: Endoscopy;  Laterality: N/A;  . ESOPHAGOGASTRODUODENOSCOPY (EGD) WITH PROPOFOL N/A 02/10/2018   Procedure: ESOPHAGOGASTRODUODENOSCOPY (EGD) WITH PROPOFOL;  Surgeon: Bernette Redbird, MD;  Location: [facility] ENDOSCOPY;  Service: Endoscopy;  Laterality: N/A;  . INGUINAL HERNIA REPAIR     Left inguinal  . TRANSTHORACIC ECHOCARDIOGRAM  04/2011   EF 55-60%; mild LVH; grade 1 diastolic dysfunction   HPI:  Patient is a 86 year old Caucasian male with past medical history significant for coronary artery disease, atrial fibrillation on Eliquis 2.5 Mg p.o. twice daily, abdominal aortic aneurysm, chronic kidney disease stage IIIb, peptic ulcer disease, PVD, MI, restless leg syndrome, sleep apnea, hypertension and hyperlipidemia.  Patient is a resident at an assisted living facility. At baseline he is able to talk and engage in a conversation although he has memory lapses and his finances and other important tasks have been managed by the daughter over the last 5 years. Patient's carer observed the patient was no longer able to speak.   MRI brain, as per neurology note, a CT revealed moderate size left frontal acute stroke.  Daughter reports that pt has runny nose, frequent coughing at baseline   Assessment / Plan / Recommendation Clinical Impression  Pt demonstrates significant dysphagia following large CVA. Pt is responsive to Po trials if cup is in his hand or spoon is touched to lips, but he is also lethargic and poorly positioned in the ED at time of assessment.  He clearly has significant oral dyspahgia secondary to CN VII (possibly XII) weakness with anterior spillage of liquids, pooling and pocketing of PO on right lingual surface and Right buccal cavity with no awareness. His effort to orally manipulate PO is brief and incomplete, with pt drifting off between trials. There is weak delayed coughing with attempts with liquids.   Given age and severity of deficits pt is unlikely to consume  enough PO for adequate nutritional support and risk of aspiration is very high. Discussed with daughter who verbalizes she would not want any feeding tube placed. Suggested RN offer necessary meds crushed in puree with oral suction present, and if pt were alert and interested in PO, bites of ice cream or teaspoon sips of liquids could be offered, which would be consistent with comfort feeding with known risk. Daughter in agreement. Will follow for plan of care and further SLP needs as pt appears to also have a global aphasia.   SLP Visit Diagnosis: Dysphagia, oropharyngeal phase (R13.12)    Aspiration Risk  Severe aspiration risk;Risk for inadequate nutrition/hydration    Diet Recommendation Other (Comment)(meds crushed in puree, bites of ice cream, spoons of water)   Medication Administration: Crushed with puree    Other  Recommendations     Follow up Recommendations 24 hour supervision/assistance      Frequency and Duration min 2x/week  2 weeks       Prognosis Prognosis for Safe Diet Advancement: Guarded Barriers to Reach Goals: Severity of deficits      Swallow Study   General HPI: Patient is a 86 year old Caucasian male with past medical history significant for coronary artery disease, atrial fibrillation on Eliquis 2.5 Mg p.o. twice daily, abdominal aortic aneurysm, chronic kidney disease stage IIIb, peptic ulcer disease, PVD, MI, restless leg syndrome, sleep apnea, hypertension and hyperlipidemia.  Patient is a resident at an assisted living facility. At baseline he is able to talk and engage in a conversation although he has memory lapses and his finances and other important tasks have been managed by the daughter over the last 5 years. Patient's carer observed the patient was no longer able to speak.   MRI brain, as per neurology note, a CT revealed moderate size left frontal acute stroke.  Daughter reports that pt has runny nose, frequent coughing at baseline Type of Study:  Bedside Swallow Evaluation Previous Swallow Assessment: none Diet Prior to this Study: NPO Temperature Spikes Noted: No Respiratory Status: Room air History of Recent Intubation: No Behavior/Cognition: Lethargic/Drowsy;Cooperative Oral Cavity Assessment: Excessive secretions Oral Care Completed by SLP: Recent completion by staff Oral Cavity - Dentition: Adequate natural dentition Vision: Impaired for self-feeding Self-Feeding Abilities: Total assist Patient Positioning: Upright in bed Baseline Vocal Quality: Normal Volitional Cough: Cognitively unable to elicit Volitional Swallow: Unable to elicit    Oral/Motor/Sensory Function Overall Oral Motor/Sensory Function: Moderate impairment Facial ROM: Reduced right;Suspected CN VII (facial) dysfunction Facial Symmetry: Abnormal symmetry right;Suspected CN VII (facial) dysfunction Facial Strength: Reduced right;Suspected CN VII (facial) dysfunction Lingual ROM: Reduced right;Suspected CN XII (hypoglossal) dysfunction Lingual Strength: Reduced   Ice Chips Ice chips: Impaired Presentation: Spoon Oral Phase Impairments: Poor awareness of bolus;Reduced lingual movement/coordination;Reduced labial seal Oral Phase Functional Implications: Right anterior spillage;Prolonged oral transit;Oral residue   Thin Liquid Thin Liquid: Impaired Presentation: Cup Oral Phase Impairments: Reduced lingual movement/coordination;Reduced labial seal Oral Phase Functional Implications: Right anterior spillage;Right lateral sulci pocketing;Oral residue Pharyngeal  Phase Impairments: Cough - Immediate;Cough - Delayed;Suspected delayed Swallow    Nectar  Thick Nectar Thick Liquid: Not tested   Honey Thick Honey Thick Liquid: Not tested   Puree Puree: Impaired Presentation: Spoon Oral Phase Impairments: Poor awareness of bolus;Reduced lingual movement/coordination;Reduced labial seal Oral Phase Functional Implications: Oral residue;Oral holding;Prolonged oral  transit;Right lateral sulci pocketing   Solid     Solid: Not tested     Harlon Ditty, MA CCC-SLP  Acute Rehabilitation Services Pager [phone] Office [phone]  Claudine Mouton 03/06/2019,1:04 PM

## 2019-03-06 NOTE — Progress Notes (Signed)
Same day note  Patient seen and examined at bedside.  Patient was admitted to the hospital for stroke, aphasia  At the time of my evaluation, patient unable to verbalize.  Moans on deep stimulus.  Physical examination reveals elderly male responding only on painful stimulus incomprehensible speech with moaning.  Laboratory data and imaging was reviewed over the last 24 hours  Assessment and Plan.  Acute CVA likely cardioembolic from history of atrial fibrillation. MRI of the brain shows moderate sized left MCA territory infarct with possible M2 M3 branch occlusion.  Patient does have a dementia at baseline.  Patient was anticoagulation at baseline. Neurology has recommended aspirin 81 mg po once daily, and for Apixaban to be held.  Patient was outside the TPA window on arrival.  Due to poor baseline modified Rankin score he was not a candidate for intervention.  Pending 2D echocardiogram and carotid vascular ultrasound.  Will consult palliative care for goals of care.  Dementia: Continue to provide supportive care  Essential hypertension: Focus on permissive hypertension at this time  Hyperlipidemia: Continue statin  History of chronic atrial fibrillation. Rate controlled at this time.  Apixaban on hold.  Continue aspirin.  CKD 3B: Monitor BMP closely.  History of CAD/MI: No acute issues at this time.  CODE STATUS.  DNR.  Family communication.  I again spoke with the patient daughter on the phone and updated her about the clinical condition of the patient and the potential poor prognosis.  At this time, will consult palliative care.  Patient's daughter wishes to see if the patient would be able to swallow.  I will continue to discuss with her regarding the clinical condition tomorrow to discuss about potential hospice if his condition would not improve.  Patient's daughter is the healthcare power of attorney for the patient and she stated that patient would never want to be  on a feeding tube.  No Charge  Signed,  Tenny Craw, MD [facility] 03/06/2019

## 2019-03-06 NOTE — Progress Notes (Signed)
STROKE TEAM PROGRESS NOTE   INTERVAL HISTORY Pt seen in ER, lying in bed, RN at bedside. Still has global aphasia and right hemiplegia. Dr. Louanne Belton has talked with pt daughter and pt is DNR and likely consider hospice if no significant improved in am. Pt did not pass swallow and family does not want feeding tube.   Vitals:   03/06/19 0646 03/06/19 0700 03/06/19 0805 03/06/19 1105  BP: (!) 148/78 (!) 149/70 136/74 133/71  Pulse: 92 76 94 97  Resp: 16 (!) 21 14 13   Temp: 99 F (37.2 C)     TempSrc: Axillary     SpO2: 93% 94% 97% 97%  Weight:      Height:        CBC:  Recent Labs  Lab 03/05/19 2024 03/05/19 2030 03/06/19 0108  WBC 11.6*  --   --   NEUTROABS 9.5*  --   --   HGB 11.4* 11.2* 10.9*  HCT 36.4* 33.0* 32.0*  MCV 96.0  --   --   PLT 199  --   --     Basic Metabolic Panel:  Recent Labs  Lab 03/05/19 2024 03/05/19 2030 03/06/19 0108  NA 137 138 140  K 4.2 4.2 3.6  CL 102 103  --   CO2 26  --   --   GLUCOSE 120* 113*  --   BUN 35* 35*  --   CREATININE 1.98* 1.90*  --   CALCIUM 8.8*  --   --    Lipid Panel:     Component Value Date/Time   CHOL 141 03/06/2019 0500   TRIG 105 03/06/2019 0500   HDL 37 (L) 03/06/2019 0500   CHOLHDL 3.8 03/06/2019 0500   VLDL 21 03/06/2019 0500   LDLCALC 83 03/06/2019 0500   HgbA1c:  Lab Results  Component Value Date   HGBA1C 6.2 (H) 03/06/2019   Urine Drug Screen:     Component Value Date/Time   LABOPIA NONE DETECTED 03/05/2019 2100   COCAINSCRNUR NONE DETECTED 03/05/2019 2100   LABBENZ NONE DETECTED 03/05/2019 2100   AMPHETMU NONE DETECTED 03/05/2019 2100   THCU NONE DETECTED 03/05/2019 2100   LABBARB NONE DETECTED 03/05/2019 2100    Alcohol Level     Component Value Date/Time   ETH <10 03/05/2019 2024    IMAGING past 48 hours DG Pelvis 1-2 Views  Result Date: 03/05/2019 CLINICAL DATA:  Fall, no history of prior injuries or surgery EXAM: PELVIS - 1-2 VIEW COMPARISON:  Angiography abdomen and pelvis  08/06/2013 FINDINGS: The osseous structures appear diffusely demineralized which may limit detection of small or nondisplaced fractures. Bones of the pelvis appear intact and congruent. No abnormal diastasis of the SI joints or symphysis pubis. Femoral heads are normally located. No proximal femoral fractures are seen. Extensive vascular calcium is noted with aortoiliac stent reconstruction. Remaining soft tissues are unremarkable. IMPRESSION: 1. Diffuse osseous demineralization which may limit detection of small or nondisplaced fractures. 2. No osseous abnormality visualized. 3. Extensive atherosclerosis.  Prior aortoiliac repair. Electronically Signed   By: Lovena Le M.D.   On: 03/05/2019 22:07   CT HEAD WO CONTRAST  Result Date: 03/05/2019 CLINICAL DATA:  86 year old male with dementia and altered mental status. Head trauma. EXAM: CT HEAD WITHOUT CONTRAST CT CERVICAL SPINE WITHOUT CONTRAST TECHNIQUE: Multidetector CT imaging of the head and cervical spine was performed following the standard protocol without intravenous contrast. Multiplanar CT image reconstructions of the cervical spine were also generated. COMPARISON:  Head CT  dated 01/09/2016. FINDINGS: CT HEAD FINDINGS Brain: There is mild age-related atrophy and chronic microvascular ischemic changes. There is no acute intracranial hemorrhage. No mass effect midline shift. No extra-axial fluid collection. Vascular: No hyperdense vessel or unexpected calcification. Skull: Normal. Negative for fracture or focal lesion. Sinuses/Orbits: Mild mucoperiosteal thickening of paranasal sinuses. No air-fluid level. The mastoid air cells are clear. Other: None CT CERVICAL SPINE FINDINGS Alignment: No acute subluxation. Skull base and vertebrae: No acute fracture. Osteopenia. There is lead bony fusion of the posterior ring of C1. Soft tissues and spinal canal: No prevertebral fluid or swelling. No visible canal hematoma. Disc levels: Multilevel degenerative  changes with endplate irregularity and disc space narrowing. Upper chest: Emphysema. Other: Bilateral carotid bulb calcified plaques. IMPRESSION: 1. No acute intracranial hemorrhage. Age-related atrophy and chronic microvascular ischemic changes. 2. No acute/traumatic cervical spine pathology. Degenerative changes. Electronically Signed   By: Elgie Collard M.D.   On: 03/05/2019 22:11   CT Cervical Spine Wo Contrast  Result Date: 03/05/2019 CLINICAL DATA:  86 year old male with dementia and altered mental status. Head trauma. EXAM: CT HEAD WITHOUT CONTRAST CT CERVICAL SPINE WITHOUT CONTRAST TECHNIQUE: Multidetector CT imaging of the head and cervical spine was performed following the standard protocol without intravenous contrast. Multiplanar CT image reconstructions of the cervical spine were also generated. COMPARISON:  Head CT dated 01/09/2016. FINDINGS: CT HEAD FINDINGS Brain: There is mild age-related atrophy and chronic microvascular ischemic changes. There is no acute intracranial hemorrhage. No mass effect midline shift. No extra-axial fluid collection. Vascular: No hyperdense vessel or unexpected calcification. Skull: Normal. Negative for fracture or focal lesion. Sinuses/Orbits: Mild mucoperiosteal thickening of paranasal sinuses. No air-fluid level. The mastoid air cells are clear. Other: None CT CERVICAL SPINE FINDINGS Alignment: No acute subluxation. Skull base and vertebrae: No acute fracture. Osteopenia. There is lead bony fusion of the posterior ring of C1. Soft tissues and spinal canal: No prevertebral fluid or swelling. No visible canal hematoma. Disc levels: Multilevel degenerative changes with endplate irregularity and disc space narrowing. Upper chest: Emphysema. Other: Bilateral carotid bulb calcified plaques. IMPRESSION: 1. No acute intracranial hemorrhage. Age-related atrophy and chronic microvascular ischemic changes. 2. No acute/traumatic cervical spine pathology. Degenerative  changes. Electronically Signed   By: Elgie Collard M.D.   On: 03/05/2019 22:11   MR ANGIO HEAD WO CONTRAST  Result Date: 03/06/2019 CLINICAL DATA:  Initial evaluation for transient ischemic attack. EXAM: MRI HEAD WITHOUT CONTRAST MRA HEAD WITHOUT CONTRAST TECHNIQUE: Multiplanar, multiecho pulse sequences of the brain and surrounding structures were obtained without intravenous contrast. Angiographic images of the head were obtained using MRA technique without contrast. COMPARISON:  Prior CT from 03/05/2019. FINDINGS: MRI HEAD FINDINGS Brain: Examination degraded by motion artifact. Generalized age-related cerebral atrophy with mild chronic small vessel ischemic disease. Confluent area of restricted diffusion involving the anterior left frontal lobe, with involvement of the left frontal operculum and underlying insula, compatible with acute MCA territory infarct (series 5, image 79). No associated hemorrhage or mass effect. No other evidence for acute or subacute ischemia. Gray-white matter differentiation otherwise maintained. No other areas of discernible remote cortical infarction. No foci of susceptibility artifact to suggest acute or chronic intracranial hemorrhage. No mass lesion, midline shift or mass effect. No hydrocephalus. No extra-axial fluid collection. Pituitary gland suprasellar region normal. Midline structures intact. Vascular: Major intracranial vascular flow voids are grossly maintained at the skull base. Skull and upper cervical spine: Craniocervical junction within normal limits. Bone marrow signal intensity normal. No scalp  soft tissue abnormality. Sinuses/Orbits: Patient status post ocular lens replacement on the left. Left gaze noted. Chronic mucoperiosteal thickening noted within the ethmoidal air cells. Paranasal sinuses are otherwise clear. No mastoid effusion. Inner ear structures grossly normal. Other: None. MRA HEAD FINDINGS ANTERIOR CIRCULATION: Examination degraded by motion  artifact. Distal cervical segments of the internal carotid arteries are patent with symmetric antegrade flow. Petrous segments patent bilaterally. Probable atheromatous irregularity throughout the carotid siphons without definite high-grade stenosis. A1 segments patent. Grossly normal anterior communicating artery complex. Anterior cerebral arteries patent to their distal aspects without stenosis. Right M1 widely patent. Grossly negative right MCA bifurcation. Distal right MCA branches perfused. Left M1 appears to bifurcate early and is grossly patent without appreciable stenosis. Grossly negative left MCA bifurcation. There is question of a left M2 and/or M3 branch occlusion (series 16, image 86). Overall, left MCA branches are attenuated as compared to the right. POSTERIOR CIRCULATION: Both vertebral arteries patent to the vertebrobasilar junction without stenosis. Neither PICA of visualized. Basilar tortuous but patent to its distal aspect without appreciable stenosis. Superior cerebral arteries grossly patent proximally. PCAs appear to be largely supplied via the basilar, and are grossly patent to their mid-distal P2 segments. Distal PCAs not well assessed due to motion. IMPRESSION: MRI HEAD IMPRESSION: 1. Motion degraded exam. 2. Moderate sized acute ischemic left MCA territory infarct involving the left insula and overlying left frontal operculum. No associated hemorrhage or significant mass effect. 3. Underlying age-related cerebral atrophy with mild chronic small vessel ischemic disease. MRA HEAD IMPRESSION: 1. Technically limited exam due to extensive motion artifact. 2. Probable left M2/M3 branch occlusion, corresponding with the acute left MCA territory infarct. 3. Otherwise grossly negative intracranial MRA. No other large vessel occlusion. No hemodynamically significant or correctable stenosis. Electronically Signed   By: Rise Mu M.D.   On: 03/06/2019 02:21   MR BRAIN WO  CONTRAST  Result Date: 03/06/2019 CLINICAL DATA:  Initial evaluation for transient ischemic attack. EXAM: MRI HEAD WITHOUT CONTRAST MRA HEAD WITHOUT CONTRAST TECHNIQUE: Multiplanar, multiecho pulse sequences of the brain and surrounding structures were obtained without intravenous contrast. Angiographic images of the head were obtained using MRA technique without contrast. COMPARISON:  Prior CT from 03/05/2019. FINDINGS: MRI HEAD FINDINGS Brain: Examination degraded by motion artifact. Generalized age-related cerebral atrophy with mild chronic small vessel ischemic disease. Confluent area of restricted diffusion involving the anterior left frontal lobe, with involvement of the left frontal operculum and underlying insula, compatible with acute MCA territory infarct (series 5, image 79). No associated hemorrhage or mass effect. No other evidence for acute or subacute ischemia. Gray-white matter differentiation otherwise maintained. No other areas of discernible remote cortical infarction. No foci of susceptibility artifact to suggest acute or chronic intracranial hemorrhage. No mass lesion, midline shift or mass effect. No hydrocephalus. No extra-axial fluid collection. Pituitary gland suprasellar region normal. Midline structures intact. Vascular: Major intracranial vascular flow voids are grossly maintained at the skull base. Skull and upper cervical spine: Craniocervical junction within normal limits. Bone marrow signal intensity normal. No scalp soft tissue abnormality. Sinuses/Orbits: Patient status post ocular lens replacement on the left. Left gaze noted. Chronic mucoperiosteal thickening noted within the ethmoidal air cells. Paranasal sinuses are otherwise clear. No mastoid effusion. Inner ear structures grossly normal. Other: None. MRA HEAD FINDINGS ANTERIOR CIRCULATION: Examination degraded by motion artifact. Distal cervical segments of the internal carotid arteries are patent with symmetric antegrade  flow. Petrous segments patent bilaterally. Probable atheromatous irregularity throughout the carotid siphons  without definite high-grade stenosis. A1 segments patent. Grossly normal anterior communicating artery complex. Anterior cerebral arteries patent to their distal aspects without stenosis. Right M1 widely patent. Grossly negative right MCA bifurcation. Distal right MCA branches perfused. Left M1 appears to bifurcate early and is grossly patent without appreciable stenosis. Grossly negative left MCA bifurcation. There is question of a left M2 and/or M3 branch occlusion (series 16, image 86). Overall, left MCA branches are attenuated as compared to the right. POSTERIOR CIRCULATION: Both vertebral arteries patent to the vertebrobasilar junction without stenosis. Neither PICA of visualized. Basilar tortuous but patent to its distal aspect without appreciable stenosis. Superior cerebral arteries grossly patent proximally. PCAs appear to be largely supplied via the basilar, and are grossly patent to their mid-distal P2 segments. Distal PCAs not well assessed due to motion. IMPRESSION: MRI HEAD IMPRESSION: 1. Motion degraded exam. 2. Moderate sized acute ischemic left MCA territory infarct involving the left insula and overlying left frontal operculum. No associated hemorrhage or significant mass effect. 3. Underlying age-related cerebral atrophy with mild chronic small vessel ischemic disease. MRA HEAD IMPRESSION: 1. Technically limited exam due to extensive motion artifact. 2. Probable left M2/M3 branch occlusion, corresponding with the acute left MCA territory infarct. 3. Otherwise grossly negative intracranial MRA. No other large vessel occlusion. No hemodynamically significant or correctable stenosis. Electronically Signed   By: Rise MuBenjamin  McClintock M.D.   On: 03/06/2019 02:21   DG Chest Port 1 View  Result Date: 03/05/2019 CLINICAL DATA:  86 year old male with altered mental status. EXAM: PORTABLE CHEST 1  VIEW COMPARISON:  Chest CT dated 05/10/2011. FINDINGS: There is mild cardiomegaly with vascular congestion. No focal consolidation, pleural effusion, or pneumothorax. Median sternotomy wires and CABG vascular clips. Atherosclerotic calcification of the aorta. No acute osseous pathology. IMPRESSION: Cardiomegaly with mild vascular congestion.  No focal consolidation. Electronically Signed   By: Elgie CollardArash  Radparvar M.D.   On: 03/05/2019 21:19   DG Abd Portable 1 View  Result Date: 03/05/2019 CLINICAL DATA:  MRI clearance EXAM: PORTABLE ABDOMEN - 1 VIEW COMPARISON:  December 29, 2010 FINDINGS: Aorto bi-iliac stent graft is seen. Surgical clips in the left upper quadrant. The bowel gas pattern is normal. No radio-opaque calculi or other significant radiographic abnormality are seen. IMPRESSION: Nonobstructive bowel gas pattern. Aorto bi-iliac stent graft and surgical clips in the left upper quadrant. Electronically Signed   By: Jonna ClarkBindu  Avutu M.D.   On: 03/05/2019 23:18    PHYSICAL EXAM  Temp:  [97.4 F (36.3 C)-99 F (37.2 C)] 99 F (37.2 C) (01/13 0646) Pulse Rate:  [76-108] 93 (01/13 1200) Resp:  [13-21] 20 (01/13 1200) BP: (129-155)/(70-99) 129/73 (01/13 1200) SpO2:  [92 %-100 %] 97 % (01/13 1200) Weight:  [88.5 kg] 88.5 kg (01/12 2038)  General - Well nourished, well developed, in no apparent distress, mild agitation.  Ophthalmologic - fundi not visualized due to noncooperation. Right eye ectropion, redness of conjunctiva  Cardiovascular - irregularly irregular heart rate and rhythm.  Neuro - awake, alert, tracking on the left visual field, global aphasia, not following commands. PERRL, left gaze preference, not cross midline. Not blinking to visual threat on the right. Not attending to the right. Right facial droop. Tongue protrusion not cooperative. LUE 5/5 and spontaneous against gravities. LLE 3/5 at least and spontaneous movement. RUE flaccid, RLE mild withdraw to pain. Right babinski  positive. Sensation, coordination and gait not tested.   ASSESSMENT/PLAN Mr. Agustina CaroliCarl Dolin is a 86 y.o. male with history of atrial fibrillation on  Eliquis, AAA, coronary artery disease, hypertension, hyperlipidemia, macular degeneration with blindness in both eyes, peripheral vascular disease, sleep apnea, restless leg syndrome, dementia, at baseline using a walker to walk presenting with speech difficulties.   Stroke: Moderate L MCA territory infarct embolic d/t atrial fibrillation even on Eliquis 2.5  CT head No acute abnormality. Small vessel disease. Atrophy.  MRI  Moderate L MCA territory infarct (L insula, L frontal operculum) Small vessel disease. Atrophy.   MRA  probable L M2/M3 occlusion   Carotid Doppler  pending  2D Echo EF 55-60%  LDL 83  HgbA1c 6.2  SCDs for VTE prophylaxis  Eliquis (apixaban) daily 2.5 bid prior to admission, now on aspirin 300 mg suppository daily. Further recs depending on pt deposition  Therapy recommendations:  SNF  Disposition:  pending - dtr are considering hospice if pt not improving in am and not passing swallow, prefers pt closer to [facility] at d/c  Atrial Fibrillation  Home anticoagulation:  Eliquis (apixaban) daily 2.5 bid  On ASA  Hold off AC for now given moderate sized stroke  Further AC recs depending on pt deposition and swallowing status   Hypertension  Stable . Permissive hypertension (OK if < 220/120) but gradually normalize in 5-7 days . Long-term BP goal normotensive  Hyperlipidemia  Home meds:  lipitor 20  Now statin on hold due to no po access  LDL 83, goal < 70  Dysphagia Secondary to stroke NPO Speech on board . daughter does not want TF, if pt not improving and not passing swallow, daughter would like hospice.   Other Stroke Risk Factors  Advanced age  Former Cigarette smoker, quit 32 yrs ago  Hx stroke/TIA  Family hx stroke (father, brother)  Coronary artery disease s/p  MI  Obstructive sleep apnea  PAD/PVD  Other Active Problems  Baseline dementia w/ sundowning  CKD stage 3b Cre 1.98->1.9  Leukocytosis WBC 11.6  Hospital day # 0  Marvel Plan, MD PhD Stroke Neurology 03/06/2019 3:26 PM   To contact Stroke Continuity provider, please refer to [email or web address]. After hours, contact General Neurology

## 2019-03-06 NOTE — ED Notes (Signed)
Report given to Gabe, RN 

## 2019-03-07 DIAGNOSIS — I739 Peripheral vascular disease, unspecified: Secondary | ICD-10-CM

## 2019-03-07 DIAGNOSIS — Z7901 Long term (current) use of anticoagulants: Secondary | ICD-10-CM

## 2019-03-07 DIAGNOSIS — R4701 Aphasia: Secondary | ICD-10-CM

## 2019-03-07 DIAGNOSIS — E7800 Pure hypercholesterolemia, unspecified: Secondary | ICD-10-CM

## 2019-03-07 LAB — GLUCOSE, CAPILLARY
Glucose-Capillary: 104 mg/dL — ABNORMAL HIGH (ref 70–99)
Glucose-Capillary: 104 mg/dL — ABNORMAL HIGH (ref 70–99)
Glucose-Capillary: 89 mg/dL (ref 70–99)
Glucose-Capillary: 91 mg/dL (ref 70–99)

## 2019-03-07 LAB — CULTURE, BLOOD (ROUTINE X 2): Special Requests: ADEQUATE

## 2019-03-07 LAB — CBC
HCT: 34.3 % — ABNORMAL LOW (ref 39.0–52.0)
Hemoglobin: 11.2 g/dL — ABNORMAL LOW (ref 13.0–17.0)
MCH: 30.6 pg (ref 26.0–34.0)
MCHC: 32.7 g/dL (ref 30.0–36.0)
MCV: 93.7 fL (ref 80.0–100.0)
Platelets: 260 10*3/uL (ref 150–400)
RBC: 3.66 MIL/uL — ABNORMAL LOW (ref 4.22–5.81)
RDW: 13.6 % (ref 11.5–15.5)
WBC: 11 10*3/uL — ABNORMAL HIGH (ref 4.0–10.5)
nRBC: 0 % (ref 0.0–0.2)

## 2019-03-07 LAB — BASIC METABOLIC PANEL
Anion gap: 8 (ref 5–15)
BUN: 27 mg/dL — ABNORMAL HIGH (ref 8–23)
CO2: 25 mmol/L (ref 22–32)
Calcium: 8.8 mg/dL — ABNORMAL LOW (ref 8.9–10.3)
Chloride: 110 mmol/L (ref 98–111)
Creatinine, Ser: 1.61 mg/dL — ABNORMAL HIGH (ref 0.61–1.24)
GFR calc Af Amer: 42 mL/min — ABNORMAL LOW (ref >60)
GFR calc non Af Amer: 36 mL/min — ABNORMAL LOW (ref >60)
Glucose, Bld: 116 mg/dL — ABNORMAL HIGH (ref 70–99)
Potassium: 3.7 mmol/L (ref 3.5–5.1)
Sodium: 143 mmol/L (ref 135–145)

## 2019-03-07 LAB — MAGNESIUM: Magnesium: 2.1 mg/dL (ref 1.7–2.4)

## 2019-03-07 MED ORDER — CHLORHEXIDINE GLUCONATE CLOTH 2 % EX PADS
6.0000 | MEDICATED_PAD | Freq: Every day | CUTANEOUS | Status: DC
  Administered 2019-03-07 – 2019-03-08 (×2): 6 via TOPICAL

## 2019-03-07 MED ORDER — ENOXAPARIN SODIUM 40 MG/0.4ML ~~LOC~~ SOLN
40.0000 mg | SUBCUTANEOUS | Status: DC
  Administered 2019-03-07 – 2019-03-08 (×2): 40 mg via SUBCUTANEOUS
  Filled 2019-03-07 (×2): qty 0.4

## 2019-03-07 NOTE — Evaluation (Signed)
Occupational Therapy Evaluation Patient Details Name: Corey Mcdonald MRN: [id number] DOB: 02/26/1923 Today's Date: 03/07/2019    History of Present Illness Patient is a 86 year old Caucasian male with past medical history significant for coronary artery disease, atrial fibrillation on Eliquis, abdominal aortic aneurysm, chronic kidney disease stage IIIb, peptic ulcer disease, PVD, MI, restless leg syndrome, sleep apnea, hypertension and hyperlipidemia, macular degeneration, legally blind.  Admitted with decreased verbal responses, CT positive for L frontal acute CVA.   Clinical Impression   OT order originally cancelled due to pt status, due to improvement- OT re ordered and initiated. PTA pt living in ILF, with caregiver and assist from family for ADL/IADL management. At time of eval, pt is sitting EOB with PTA and following simple commands with increased processing time. Stedy used for transfers with mod A +2. RN encouraging urination to decrease need for foley. Pt transferred to toilet with stedy and mod A +2, able to have a BM (no urination) and needing total A for peri care. Pt noted to have R inattention and RUE weakness with some tone. He was able to repeat back his name and offer some automatic responses during session. Given current status, recommend SNF at d/c for continued progression of BADL back to baseline. Will continue to follow per POC listed below.    Follow Up Recommendations  SNF;Supervision/Assistance - 24 hour    Equipment Recommendations  None recommended by OT    Recommendations for Other Services Other (comment)(Palliative)     Precautions / Restrictions Precautions Precautions: Fall Precaution Comments: R inattention, L gaze Restrictions Weight Bearing Restrictions: No      Mobility Bed Mobility Overal bed mobility: Needs Assistance Bed Mobility: Supine to Sit     Supine to sit: Max assist     General bed mobility comments: sitting up EOB with PTA on  arrival  Transfers Overall transfer level: Needs assistance Equipment used: Ambulation equipment used Transfers: Sit to/from Stand Sit to Stand: Mod assist;+2 physical assistance         General transfer comment: mod A +2 with increased support at RUE in stedy    Balance Overall balance assessment: Needs assistance Sitting-balance support: Single extremity supported Sitting balance-Leahy Scale: Poor Sitting balance - Comments: difficulty maintaining sitting balance without environmental changes/support   Standing balance support: Single extremity supported;Bilateral upper extremity supported;During functional activity Standing balance-Leahy Scale: Poor Standing balance comment: reliant on stedy and assist from therapists on R side                           ADL either performed or assessed with clinical judgement   ADL Overall ADL's : Needs assistance/impaired Eating/Feeding: NPO   Grooming: Minimal assistance;Cueing for sequencing;Cueing for compensatory techniques;Sitting   Upper Body Bathing: Minimal assistance;Cueing for safety;Cueing for sequencing;Sitting   Lower Body Bathing: Maximal assistance;Cueing for safety;Cueing for sequencing;Sit to/from stand   Upper Body Dressing : Minimal assistance;Cueing for safety;Cueing for sequencing;Sitting   Lower Body Dressing: Maximal assistance;Cueing for sequencing;Cueing for compensatory techniques;Sit to/from stand   Toilet Transfer: Moderate assistance;+2 for physical assistance;+2 for safety/equipment;Cueing for safety;Cueing for sequencing Toilet Transfer Details (indicate cue type and reason): use of stedy to sit on toilet in room. RN wanting to encourage urination to prevent foley. Pt able to have BM, but did not note urine after sitting on toilet Toileting- Clothing Manipulation and Hygiene: Total assistance;Sit to/from stand Toileting - Clothing Manipulation Details (indicate cue type and reason): total A to  clean peri area in standing in stedy     Functional mobility during ADLs: Moderate assistance;+2 for physical assistance;+2 for safety/equipment(use of stedy toilet, to chair) General ADL Comments: pt limtied by R sided weakness, communication deficits, and decreased cognition     Vision Patient Visual Report: Other (comment)(cannot state) Vision Assessment?: Vision impaired- to be further tested in functional context Additional Comments: will need to continue to assess vision. Pt with difficulty following multistep commands and verbalizing to engage in full vision assessment. Appears to have some R innattention and disorganized scanning pattern with L gaze. R eye is red irritated     Perception     Praxis      Pertinent Vitals/Pain Pain Assessment: Faces Faces Pain Scale: No hurt     Hand Dominance     Extremity/Trunk Assessment Upper Extremity Assessment Upper Extremity Assessment: RUE deficits/detail RUE Deficits / Details: some volitional movement at shoulder; increased weakness from forearm down with some tone noted to be forming; cannot consistently maintain grasp LUE Deficits / Details: [facility]   Lower Extremity Assessment Lower Extremity Assessment: Defer to PT evaluation       Communication Communication Communication: Expressive difficulties   Cognition Arousal/Alertness: Lethargic Behavior During Therapy: WFL for tasks assessed/performed Overall Cognitive Status: Difficult to assess                                 General Comments: pt following one step commands with increased processing time and repitition this date. Was able to repeat back his name (still unclear) and nod yes and no occasionally throughout session   General Comments       Exercises     Shoulder Instructions      Home Living Family/patient expects to be discharged to:: Private residence Living Arrangements: Alone Available Help at Discharge: Family;Available  PRN/intermittently Type of Home: Independent living facility Home Access: Level entry     Home Layout: One level     Bathroom Shower/Tub: Chief Strategy Officer: Standard     Home Equipment: Environmental consultant - 2 wheels;Bedside commode;Grab bars - tub/shower;Grab bars - toilet;Cane - single point          Prior Functioning/Environment Level of Independence: Independent with assistive device(s)        Comments: uses walker, has one meal delivered a day and can retrieve and set up on his own, gets his own breakfast which family set up for him, has caregiver 7 d/week 5-8:30 due to sundowning and help with dinner; sleeps a lot        OT Problem List: Decreased strength;Decreased knowledge of use of DME or AE;Decreased coordination;Decreased activity tolerance;Decreased cognition;Impaired UE functional use;Impaired balance (sitting and/or standing);Impaired vision/perception;Decreased safety awareness      OT Treatment/Interventions: Self-care/ADL training;Visual/perceptual remediation/compensation;Therapeutic exercise;Patient/family education;Neuromuscular education;Balance training;Energy conservation;Therapeutic activities;DME and/or AE instruction;Cognitive remediation/compensation    OT Goals(Current goals can be found in the care plan section) Acute Rehab OT Goals Patient Stated Goal: To get up when he can OT Goal Formulation: With patient Time For Goal Achievement: 03/21/19 Potential to Achieve Goals: Good  OT Frequency: Min 2X/week   Barriers to D/C:            Co-evaluation PT/OT/SLP Co-Evaluation/Treatment: Yes Reason for Co-Treatment: Complexity of the patient's impairments (multi-system involvement);Necessary to address cognition/behavior during functional activity;For patient/therapist safety;To address functional/ADL transfers PT goals addressed during session: Mobility/safety with mobility OT goals addressed during session: ADL's  and self-care;Proper use of  Adaptive equipment and DME;Strengthening/ROM      AM-PAC OT "6 Clicks" Daily Activity     Outcome Measure Help from another person eating meals?: Total(NPO) Help from another person taking care of personal grooming?: A Little Help from another person toileting, which includes using toliet, bedpan, or urinal?: A Lot Help from another person bathing (including washing, rinsing, drying)?: A Lot Help from another person to put on and taking off regular upper body clothing?: A Little Help from another person to put on and taking off regular lower body clothing?: A Lot 6 Click Score: 13   End of Session Equipment Utilized During Treatment: Gait belt;Other (comment)(stedy) Nurse Communication: Mobility status  Activity Tolerance: Patient tolerated treatment well Patient left: in chair;with call bell/phone within reach;Other (comment)(no chair alarms on unit, belt would not fit around bar chair. RN/NT notified and pt placed in view with window)  OT Visit Diagnosis: Unsteadiness on feet (R26.81);Other abnormalities of gait and mobility (R26.89);Muscle weakness (generalized) (M62.81);Cognitive communication deficit (R41.841);Hemiplegia and hemiparesis;Other symptoms and signs involving cognitive function Symptoms and signs involving cognitive functions: Cerebral infarction Hemiplegia - Right/Left: Left Hemiplegia - dominant/non-dominant: (unsure) Hemiplegia - caused by: Cerebral infarction                Time: 7588-3254 OT Time Calculation (min): 31 min Charges:  OT General Charges $OT Visit: 1 Visit OT Evaluation $OT Eval Moderate Complexity: 1 Mod  Dalphine Handing, MSOT, OTR/L Acute Rehabilitation Services [facility] Office Number: [phone]  Dalphine Handing 03/07/2019, 2:22 PM

## 2019-03-07 NOTE — Progress Notes (Signed)
PROGRESS NOTE  Corey CaroliCarl Quirino Mcdonald:[id number] DOB: [birth year] DOA: 03/05/2019 PCP: Corey Mcdonald, [name], NP   LOS: 1 day   Brief narrative: As per HPI,  Patient is a 86 year old Caucasian male with past medical history significant for coronary artery disease, atrial fibrillation on Eliquis 2.5 Mg p.o. twice daily, abdominal aortic aneurysm, chronic kidney disease stage IIIb, peptic ulcer disease, PVD, MI, restless leg syndrome, sleep apnea, hypertension and hyperlipidemia.  Patient is a resident at an assisted living facility.  Patient's carer observed the patient was no longer able to speak earlier today (around 5 PM).  Patient was also noted to be awake.  EMR was activated.  On presentation to the hospital, CT head did not reveal any acute findings.  MRI brain, as per neurology note, a CT revealed moderate size left frontal acute stroke.  ED Course: On presentation to the emergency room, temperature is 97.4, blood pressure 140/77, heart rate of 83, respiratory rate of 20 with O2 sat of 100% on room air.  CBC reveals WBC of 11.6, hemoglobin of 11.4, hematocrit of 36.4, MCV of 96 with platelet counts of 199.  Chemistry reveals sodium of 137, potassium of 4.2, chloride 102, CO2 of 26, BUN of 35, creatinine of 1.98, with a blood sugar of 113.  Patient was admitted for further assessment and management.  Assessment/Plan:  Active Problems:   Acute CVA (cerebrovascular accident) (HCC)  Assessment and Plan.  Acute CVA likely cardioembolic from history of atrial fibrillation. MRI of the brain shows moderate sized left MCA territory infarct with possible M2 M3 branch occlusion.  Patient does have dementia at baseline.  Patient was on  anticoagulation with eliquis at baseline but despite that he sustained stroke. Neurology has recommended aspirin 81 mg po once daily, and for Apixaban to be held.  Other recommendations will depend on the goals of care.  Patient was outside the TPA window on arrival.  Due to  poor baseline modified Rankin score he was not a candidate for intervention.   2D echocardiogram with EF of 55-60%. Palliative care consulted for goals of care.    Urinary retention. Twice and needed in and out cath. Will put in a foley catheter if he retains again for comfort.  Coagulase negative staph in 1 bottle.  Likely contaminant.  Dementia Continue to provide supportive care.  Essential hypertension: BP stable at this time.  Continue permissive hypertension at this time  Hyperlipidemia: statin if able to swallow.  History of chronic atrial fibrillation. Rate controlled at this time.  Apixaban on hold.  Continue aspirin via suppository.  CKD 3B: Monitor BMP.  Weekly  History of CAD/MI: No acute issues at this time.   VTE Prophylaxis: Add Lovenox.  Code Status:  DNR  Family Communication: I again spoke with the patient's daughter Ms. Corey Mcdonald on the phone today.  I spoke about agitation issues overnight and no clinically significant improvement.  Spoke about hospice level of care/comfort care.  She wishes to come to the hospital and will update us.  Disposition Plan: Uncertain at this time.  Consultants:  Neurology  Palliative care-consulted   Procedures:  None  Antibiotics:  Anti-infectives (From admission, onward)   None     Subjective: Today, patient appears to be little agitated.  Had episodes of urinary retention and needed in and out Foley catheter.  No changes in his mentation compared to yesterday.  Objective: Vitals:   03/07/19 0420 03/07/19 0744  BP: (!) 158/70 119/65  Pulse: 99 92  Resp: 20 20  Temp: 99.9 F (37.7 C) 97.6 F (36.4 C)  SpO2: 93% 94%    Intake/Output Summary (Last 24 hours) at 03/07/2019 0819 Last data filed at 03/07/2019 0514 Gross per 24 hour  Intake --  Output 400 ml  Net -400 ml   Filed Weights   03/05/19 2038  Weight: 88.5 kg   Body mass index is 28.8 kg/m.   Physical Exam: GENERAL: Patient is  mildly agitated, right eye is congested. HENT: Oral mucosa is mildly dry. NECK: is supple, no palpable thyroid enlargement. CHEST: No crackles or wheezes. . Diminished breath sounds bilaterally. CVS: S1 and S2 heard, no murmur.  Mildly tachycardic.  Regular rate and rhythm. No pericardial rub. ABDOMEN: Soft, non-tender, bowel sounds are present.  Condom cath in place EXTREMITIES: No edema. CNS: Patient is agitated, appears uncomfortable.  Nonverbal.. SKIN: warm and dry without rashes.  Data Review: I have personally reviewed the following laboratory data and studies,  CBC: Recent Labs  Lab 03/05/19 2024 03/05/19 2030 03/06/19 0108 03/07/19 0348  WBC 11.6*  --   --  11.0*  NEUTROABS 9.5*  --   --   --   HGB 11.4* 11.2* 10.9* 11.2*  HCT 36.4* 33.0* 32.0* 34.3*  MCV 96.0  --   --  93.7  PLT 199  --   --  260   Basic Metabolic Panel: Recent Labs  Lab 03/05/19 2024 03/05/19 2030 03/06/19 0108 03/07/19 0348  NA 137 138 140 143  K 4.2 4.2 3.6 3.7  CL 102 103  --  110  CO2 26  --   --  25  GLUCOSE 120* 113*  --  116*  BUN 35* 35*  --  27*  CREATININE 1.98* 1.90*  --  1.61*  CALCIUM 8.8*  --   --  8.8*  MG  --   --   --  2.1   Liver Function Tests: Recent Labs  Lab 03/05/19 2024  AST 22  ALT 23  ALKPHOS 53  BILITOT 0.6  PROT 7.1  ALBUMIN 3.3*   No results for input(s): LIPASE, AMYLASE in the last 168 hours. No results for input(s): AMMONIA in the last 168 hours. Cardiac Enzymes: Recent Labs  Lab 03/05/19 2024  CKTOTAL 90   BNP (last 3 results) No results for input(s): BNP in the last 8760 hours.  ProBNP (last 3 results) No results for input(s): PROBNP in the last 8760 hours.  CBG: Recent Labs  Lab 03/06/19 0651 03/06/19 1816 03/06/19 2344 03/07/19 0629  GLUCAP 106* 91 91 104*   Recent Results (from the past 240 hour(s))  Urine culture     Status: None   Collection Time: 03/05/19  9:00 PM   Specimen: Urine, Catheterized  Result Value Ref Range  Status   Specimen Description URINE, CATHETERIZED  Final   Special Requests NONE  Final   Culture   Final    NO GROWTH Performed at [facility], [address] [place], [place] [address]    Report Status 03/06/2019 FINAL  Final  SARS CORONAVIRUS 2 (TAT 6-24 HRS) Nasopharyngeal Nasopharyngeal Swab     Status: None   Collection Time: 03/05/19 10:31 PM   Specimen: Nasopharyngeal Swab  Result Value Ref Range Status   SARS Coronavirus 2 NEGATIVE NEGATIVE Final    Comment: (NOTE) SARS-CoV-2 target nucleic acids are NOT DETECTED. The SARS-CoV-2 RNA is generally detectable in upper and lower respiratory specimens during the acute phase of infection. Negative  results do not preclude SARS-CoV-2 infection, do not rule out co-infections with other pathogens, and should not be used as the sole basis for treatment or other patient management decisions. Negative results must be combined with clinical observations, patient history, and epidemiological information. The expected result is Negative. Fact Sheet for Patients: HairSlick.no Fact Sheet for Healthcare Providers: quierodirigir.com This test is not yet approved or cleared by the [place] FDA and  has been authorized for detection and/or diagnosis of SARS-CoV-2 by FDA under an Emergency Use Authorization (EUA). This EUA will remain  in effect (meaning this test can be used) for the duration of the COVID-19 declaration under Section 56 4(b)(1) of the Act, 21 U.S.C. section 360bbb-3(b)(1), unless the authorization is terminated or revoked sooner. Performed at [facility], [address], [place], [place] [address]   Culture, blood (routine x 2)     Status: None (Preliminary result)   Collection Time: 03/06/19  2:50 AM   Specimen: BLOOD LEFT WRIST  Result Value Ref Range Status   Specimen Description BLOOD LEFT WRIST  Final   Special Requests   Final    BOTTLES  DRAWN AEROBIC AND ANAEROBIC Blood Culture adequate volume   Culture  Setup Time   Final    AEROBIC BOTTLE ONLY GRAM POSITIVE COCCI CRITICAL RESULT CALLED TO, READ BACK BY AND VERIFIED WITH: Evelena Peat [facility] [place] [facility] 03/06/19 2322 JDW Performed at [facility], [address] [place], [place] [address]    Culture PENDING  Incomplete   Report Status PENDING  Incomplete     Studies: DG Pelvis 1-2 Views  Result Date: 03/05/2019 CLINICAL DATA:  Fall, no history of prior injuries or surgery EXAM: PELVIS - 1-2 VIEW COMPARISON:  Angiography abdomen and pelvis 08/06/2013 FINDINGS: The osseous structures appear diffusely demineralized which may limit detection of small or nondisplaced fractures. Bones of the pelvis appear intact and congruent. No abnormal diastasis of the SI joints or symphysis pubis. Femoral heads are normally located. No proximal femoral fractures are seen. Extensive vascular calcium is noted with aortoiliac stent reconstruction. Remaining soft tissues are unremarkable. IMPRESSION: 1. Diffuse osseous demineralization which may limit detection of small or nondisplaced fractures. 2. No osseous abnormality visualized. 3. Extensive atherosclerosis.  Prior aortoiliac repair. Electronically Signed   By: Kreg Shropshire M.D.   On: 03/05/2019 22:07   CT HEAD WO CONTRAST  Result Date: 03/05/2019 CLINICAL DATA:  86 year old male with dementia and altered mental status. Head trauma. EXAM: CT HEAD WITHOUT CONTRAST CT CERVICAL SPINE WITHOUT CONTRAST TECHNIQUE: Multidetector CT imaging of the head and cervical spine was performed following the standard protocol without intravenous contrast. Multiplanar CT image reconstructions of the cervical spine were also generated. COMPARISON:  Head CT dated 01/09/2016. FINDINGS: CT HEAD FINDINGS Brain: There is mild age-related atrophy and chronic microvascular ischemic changes. There is no acute intracranial hemorrhage. No mass effect midline shift. No extra-axial fluid  collection. Vascular: No hyperdense vessel or unexpected calcification. Skull: Normal. Negative for fracture or focal lesion. Sinuses/Orbits: Mild mucoperiosteal thickening of paranasal sinuses. No air-fluid level. The mastoid air cells are clear. Other: None CT CERVICAL SPINE FINDINGS Alignment: No acute subluxation. Skull base and vertebrae: No acute fracture. Osteopenia. There is lead bony fusion of the posterior ring of C1. Soft tissues and spinal canal: No prevertebral fluid or swelling. No visible canal hematoma. Disc levels: Multilevel degenerative changes with endplate irregularity and disc space narrowing. Upper chest: Emphysema. Other: Bilateral carotid bulb calcified plaques. IMPRESSION: 1.  No acute intracranial hemorrhage. Age-related atrophy and chronic microvascular ischemic changes. 2. No acute/traumatic cervical spine pathology. Degenerative changes. Electronically Signed   By: Anner Crete M.D.   On: 03/05/2019 22:11   CT Cervical Spine Wo Contrast  Result Date: 03/05/2019 CLINICAL DATA:  86 year old male with dementia and altered mental status. Head trauma. EXAM: CT HEAD WITHOUT CONTRAST CT CERVICAL SPINE WITHOUT CONTRAST TECHNIQUE: Multidetector CT imaging of the head and cervical spine was performed following the standard protocol without intravenous contrast. Multiplanar CT image reconstructions of the cervical spine were also generated. COMPARISON:  Head CT dated 01/09/2016. FINDINGS: CT HEAD FINDINGS Brain: There is mild age-related atrophy and chronic microvascular ischemic changes. There is no acute intracranial hemorrhage. No mass effect midline shift. No extra-axial fluid collection. Vascular: No hyperdense vessel or unexpected calcification. Skull: Normal. Negative for fracture or focal lesion. Sinuses/Orbits: Mild mucoperiosteal thickening of paranasal sinuses. No air-fluid level. The mastoid air cells are clear. Other: None CT CERVICAL SPINE FINDINGS Alignment: No acute  subluxation. Skull base and vertebrae: No acute fracture. Osteopenia. There is lead bony fusion of the posterior ring of C1. Soft tissues and spinal canal: No prevertebral fluid or swelling. No visible canal hematoma. Disc levels: Multilevel degenerative changes with endplate irregularity and disc space narrowing. Upper chest: Emphysema. Other: Bilateral carotid bulb calcified plaques. IMPRESSION: 1. No acute intracranial hemorrhage. Age-related atrophy and chronic microvascular ischemic changes. 2. No acute/traumatic cervical spine pathology. Degenerative changes. Electronically Signed   By: Anner Crete M.D.   On: 03/05/2019 22:11   MR ANGIO HEAD WO CONTRAST  Result Date: 03/06/2019 CLINICAL DATA:  Initial evaluation for transient ischemic attack. EXAM: MRI HEAD WITHOUT CONTRAST MRA HEAD WITHOUT CONTRAST TECHNIQUE: Multiplanar, multiecho pulse sequences of the brain and surrounding structures were obtained without intravenous contrast. Angiographic images of the head were obtained using MRA technique without contrast. COMPARISON:  Prior CT from 03/05/2019. FINDINGS: MRI HEAD FINDINGS Brain: Examination degraded by motion artifact. Generalized age-related cerebral atrophy with mild chronic small vessel ischemic disease. Confluent area of restricted diffusion involving the anterior left frontal lobe, with involvement of the left frontal operculum and underlying insula, compatible with acute MCA territory infarct (series 5, image 79). No associated hemorrhage or mass effect. No other evidence for acute or subacute ischemia. Gray-white matter differentiation otherwise maintained. No other areas of discernible remote cortical infarction. No foci of susceptibility artifact to suggest acute or chronic intracranial hemorrhage. No mass lesion, midline shift or mass effect. No hydrocephalus. No extra-axial fluid collection. Pituitary gland suprasellar region normal. Midline structures intact. Vascular: Major  intracranial vascular flow voids are grossly maintained at the skull base. Skull and upper cervical spine: Craniocervical junction within normal limits. Bone marrow signal intensity normal. No scalp soft tissue abnormality. Sinuses/Orbits: Patient status post ocular lens replacement on the left. Left gaze noted. Chronic mucoperiosteal thickening noted within the ethmoidal air cells. Paranasal sinuses are otherwise clear. No mastoid effusion. Inner ear structures grossly normal. Other: None. MRA HEAD FINDINGS ANTERIOR CIRCULATION: Examination degraded by motion artifact. Distal cervical segments of the internal carotid arteries are patent with symmetric antegrade flow. Petrous segments patent bilaterally. Probable atheromatous irregularity throughout the carotid siphons without definite high-grade stenosis. A1 segments patent. Grossly normal anterior communicating artery complex. Anterior cerebral arteries patent to their distal aspects without stenosis. Right M1 widely patent. Grossly negative right MCA bifurcation. Distal right MCA branches perfused. Left M1 appears to bifurcate early and is grossly patent without appreciable stenosis. Grossly negative left MCA  bifurcation. There is question of a left M2 and/or M3 branch occlusion (series 16, image 86). Overall, left MCA branches are attenuated as compared to the right. POSTERIOR CIRCULATION: Both vertebral arteries patent to the vertebrobasilar junction without stenosis. Neither PICA of visualized. Basilar tortuous but patent to its distal aspect without appreciable stenosis. Superior cerebral arteries grossly patent proximally. PCAs appear to be largely supplied via the basilar, and are grossly patent to their mid-distal P2 segments. Distal PCAs not well assessed due to motion. IMPRESSION: MRI HEAD IMPRESSION: 1. Motion degraded exam. 2. Moderate sized acute ischemic left MCA territory infarct involving the left insula and overlying left frontal operculum. No  associated hemorrhage or significant mass effect. 3. Underlying age-related cerebral atrophy with mild chronic small vessel ischemic disease. MRA HEAD IMPRESSION: 1. Technically limited exam due to extensive motion artifact. 2. Probable left M2/M3 branch occlusion, corresponding with the acute left MCA territory infarct. 3. Otherwise grossly negative intracranial MRA. No other large vessel occlusion. No hemodynamically significant or correctable stenosis. Electronically Signed   By: Rise Mu M.D.   On: 03/06/2019 02:21   MR BRAIN WO CONTRAST  Result Date: 03/06/2019 CLINICAL DATA:  Initial evaluation for transient ischemic attack. EXAM: MRI HEAD WITHOUT CONTRAST MRA HEAD WITHOUT CONTRAST TECHNIQUE: Multiplanar, multiecho pulse sequences of the brain and surrounding structures were obtained without intravenous contrast. Angiographic images of the head were obtained using MRA technique without contrast. COMPARISON:  Prior CT from 03/05/2019. FINDINGS: MRI HEAD FINDINGS Brain: Examination degraded by motion artifact. Generalized age-related cerebral atrophy with mild chronic small vessel ischemic disease. Confluent area of restricted diffusion involving the anterior left frontal lobe, with involvement of the left frontal operculum and underlying insula, compatible with acute MCA territory infarct (series 5, image 79). No associated hemorrhage or mass effect. No other evidence for acute or subacute ischemia. Gray-white matter differentiation otherwise maintained. No other areas of discernible remote cortical infarction. No foci of susceptibility artifact to suggest acute or chronic intracranial hemorrhage. No mass lesion, midline shift or mass effect. No hydrocephalus. No extra-axial fluid collection. Pituitary gland suprasellar region normal. Midline structures intact. Vascular: Major intracranial vascular flow voids are grossly maintained at the skull base. Skull and upper cervical spine:  Craniocervical junction within normal limits. Bone marrow signal intensity normal. No scalp soft tissue abnormality. Sinuses/Orbits: Patient status post ocular lens replacement on the left. Left gaze noted. Chronic mucoperiosteal thickening noted within the ethmoidal air cells. Paranasal sinuses are otherwise clear. No mastoid effusion. Inner ear structures grossly normal. Other: None. MRA HEAD FINDINGS ANTERIOR CIRCULATION: Examination degraded by motion artifact. Distal cervical segments of the internal carotid arteries are patent with symmetric antegrade flow. Petrous segments patent bilaterally. Probable atheromatous irregularity throughout the carotid siphons without definite high-grade stenosis. A1 segments patent. Grossly normal anterior communicating artery complex. Anterior cerebral arteries patent to their distal aspects without stenosis. Right M1 widely patent. Grossly negative right MCA bifurcation. Distal right MCA branches perfused. Left M1 appears to bifurcate early and is grossly patent without appreciable stenosis. Grossly negative left MCA bifurcation. There is question of a left M2 and/or M3 branch occlusion (series 16, image 86). Overall, left MCA branches are attenuated as compared to the right. POSTERIOR CIRCULATION: Both vertebral arteries patent to the vertebrobasilar junction without stenosis. Neither PICA of visualized. Basilar tortuous but patent to its distal aspect without appreciable stenosis. Superior cerebral arteries grossly patent proximally. PCAs appear to be largely supplied via the basilar, and are grossly patent to their mid-distal  P2 segments. Distal PCAs not well assessed due to motion. IMPRESSION: MRI HEAD IMPRESSION: 1. Motion degraded exam. 2. Moderate sized acute ischemic left MCA territory infarct involving the left insula and overlying left frontal operculum. No associated hemorrhage or significant mass effect. 3. Underlying age-related cerebral atrophy with mild chronic  small vessel ischemic disease. MRA HEAD IMPRESSION: 1. Technically limited exam due to extensive motion artifact. 2. Probable left M2/M3 branch occlusion, corresponding with the acute left MCA territory infarct. 3. Otherwise grossly negative intracranial MRA. No other large vessel occlusion. No hemodynamically significant or correctable stenosis. Electronically Signed   By: Rise MuBenjamin  McClintock M.D.   On: 03/06/2019 02:21   DG Chest Port 1 View  Result Date: 03/05/2019 CLINICAL DATA:  86 year old male with altered mental status. EXAM: PORTABLE CHEST 1 VIEW COMPARISON:  Chest CT dated 05/10/2011. FINDINGS: There is mild cardiomegaly with vascular congestion. No focal consolidation, pleural effusion, or pneumothorax. Median sternotomy wires and CABG vascular clips. Atherosclerotic calcification of the aorta. No acute osseous pathology. IMPRESSION: Cardiomegaly with mild vascular congestion.  No focal consolidation. Electronically Signed   By: Elgie CollardArash  Radparvar M.D.   On: 03/05/2019 21:19   DG Abd Portable 1 View  Result Date: 03/05/2019 CLINICAL DATA:  MRI clearance EXAM: PORTABLE ABDOMEN - 1 VIEW COMPARISON:  December 29, 2010 FINDINGS: Aorto bi-iliac stent graft is seen. Surgical clips in the left upper quadrant. The bowel gas pattern is normal. No radio-opaque calculi or other significant radiographic abnormality are seen. IMPRESSION: Nonobstructive bowel gas pattern. Aorto bi-iliac stent graft and surgical clips in the left upper quadrant. Electronically Signed   By: Jonna ClarkBindu  Avutu M.D.   On: 03/05/2019 23:18   ECHOCARDIOGRAM COMPLETE  Result Date: 03/06/2019   ECHOCARDIOGRAM REPORT   Patient Name:   Corey CaroliCARL Goracke Date of Exam: 03/06/2019 Medical Rec #:  [id number]   Height:       69.0 in Accession #:    [id number]  Weight:       195.0 lb Date of Birth:  [birth year]  BSA:          2.04 m Patient Age:    89 years    BP:           136/74 mmHg Patient Gender: M           HR:           87 bpm. Exam Location:   Inpatient Procedure: 2D Echo, Cardiac Doppler and Color Doppler Indications:    Stroke 434.94/I163.9  History:        Patient has prior history of Echocardiogram examinations, most                 recent 07/31/2017. CAD and Previous Myocardial Infarction, PAD,                 Arrythmias:Atrial Fibrillation; Risk Factors:Hypertension,                 Dyslipidemia and Sleep Apnea. Abdominal Aortic Aneurysm.  Sonographer:    Elmarie Shileyiffany Dance Referring Phys: [id number] SYLVESTER I OGBATA IMPRESSIONS  1. Left ventricular ejection fraction, by visual estimation, is 55 to 60%. The left ventricle has normal function. There is mildly increased left ventricular hypertrophy.  2. Left ventricular diastolic parameters are indeterminate.  3. The left ventricle demonstrates regional wall motion abnormalities. Basal inferior and basal inferolateral hypokinesis.  4. Global right ventricle has normal systolic function.The right ventricular size is normal. No increase in right ventricular wall thickness.  5.  Left atrial size was moderately dilated.  6. Right atrial size was mildly dilated.  7. Moderate calcification of the mitral valve leaflet(s).  8. Moderate mitral annular calcification.  9. The mitral valve is degenerative. Trivial mitral valve regurgitation. No evidence of mitral stenosis. 10. The tricuspid valve is normal in structure. 11. The aortic valve is bicuspid with fused left and noncoronary cusps. Aortic valve regurgitation is trivial. Probably moderate aortic stenosis. Stenosis is visually moderate and calculated AVA is 1.1 cm^2 though mean gradient is only 13 mmHg. 12. The inferior vena cava is dilated in size with <50% respiratory variability, suggesting right atrial pressure of 15 mmHg. 13. The tricuspid regurgitant velocity is 2.29 m/s, and with an assumed right atrial pressure of 15 mmHg, the estimated right ventricular systolic pressure is mildly elevated at 36.0 mmHg. FINDINGS  Left Ventricle: Left ventricular ejection  fraction, by visual estimation, is 55 to 60%. The left ventricle has normal function. The left ventricle demonstrates regional wall motion abnormalities. The left ventricular internal cavity size was the left ventricle is normal in size. There is mildly increased left ventricular hypertrophy. The left ventricular diastology could not be evaluated due to atrial fibrillation. Left ventricular diastolic parameters are indeterminate. Right Ventricle: The right ventricular size is normal. No increase in right ventricular wall thickness. Global RV systolic function is has normal systolic function. The tricuspid regurgitant velocity is 2.29 m/s, and with an assumed right atrial pressure  of 15 mmHg, the estimated right ventricular systolic pressure is mildly elevated at 36.0 mmHg. Left Atrium: Left atrial size was moderately dilated. Right Atrium: Right atrial size was mildly dilated Pericardium: There is no evidence of pericardial effusion. Mitral Valve: The mitral valve is degenerative in appearance. There is moderate calcification of the mitral valve leaflet(s). Moderate mitral annular calcification. Trivial mitral valve regurgitation. No evidence of mitral valve stenosis by observation. Tricuspid Valve: The tricuspid valve is normal in structure. Tricuspid valve regurgitation is mild. Aortic Valve: The aortic valve is bicuspid. Aortic valve regurgitation is trivial. Moderate aortic stenosis is present. Aortic valve mean gradient measures 13.0 mmHg. Aortic valve peak gradient measures 20.5 mmHg. Aortic valve area, by VTI measures 1.19 cm. Pulmonic Valve: The pulmonic valve was normal in structure. Pulmonic valve regurgitation is not visualized. Pulmonic regurgitation is not visualized. Aorta: The aortic root is normal in size and structure. Venous: The inferior vena cava is dilated in size with less than 50% respiratory variability, suggesting right atrial pressure of 15 mmHg. IAS/Shunts: No atrial level shunt detected  by color flow Doppler.  LEFT VENTRICLE PLAX 2D LVIDd:         4.92 cm LVIDs:         3.94 cm LV PW:         1.02 cm LV IVS:        1.12 cm LVOT diam:     2.20 cm LV SV:         46 ml LV SV Index:   22.12 LVOT Area:     3.80 cm  RIGHT VENTRICLE          IVC RV Basal diam:  3.57 cm  IVC diam: 2.77 cm RV Mid diam:    2.30 cm TAPSE (M-mode): 1.9 cm LEFT ATRIUM              Index       RIGHT ATRIUM           Index LA diam:  4.70 cm  2.30 cm/m  RA Area:     23.90 cm LA Vol (A2C):   113.0 ml 55.29 ml/m RA Volume:   75.70 ml  37.04 ml/m LA Vol (A4C):   68.4 ml  33.47 ml/m LA Biplane Vol: 88.0 ml  43.06 ml/m  AORTIC VALVE AV Area (Vmax):    1.16 cm AV Area (Vmean):   1.15 cm AV Area (VTI):     1.19 cm AV Vmax:           226.33 cm/s AV Vmean:          159.500 cm/s AV VTI:            0.493 m AV Peak Grad:      20.5 mmHg AV Mean Grad:      13.0 mmHg LVOT Vmax:         69.05 cm/s LVOT Vmean:        48.300 cm/s LVOT VTI:          0.154 m LVOT/AV VTI ratio: 0.31  AORTA Ao Root diam: 3.60 cm Ao Asc diam:  3.10 cm MITRAL VALVE                        TRICUSPID VALVE MV Area (PHT): 3.85 cm             TR Peak grad:   21.0 mmHg MV PHT:        57.13 msec           TR Vmax:        229.00 cm/s MV Decel Time: 197 msec MV E velocity: 118.00 cm/s 103 cm/s SHUNTS                                     Systemic VTI:  0.15 m                                     Systemic Diam: 2.20 cm  Marca Ancona MD Electronically signed by Marca Ancona MD Signature Date/Time: 03/06/2019/1:53:11 PM    Final     Scheduled Meds: .  stroke: mapping our early stages of recovery book   Does not apply Once  . aspirin EC  325 mg Oral Daily   Or  . aspirin  300 mg Rectal Daily  . atorvastatin  40 mg Oral q1800  . docusate sodium  100 mg Oral QAC supper  . pantoprazole  40 mg Oral Daily  . tamsulosin  0.4 mg Oral QPM    Continuous Infusions: . sodium chloride 50 mL/hr at 03/07/19 0545    Joycelyn Das, MD  [facility] 03/07/2019

## 2019-03-07 NOTE — Progress Notes (Signed)
STROKE TEAM PROGRESS NOTE   INTERVAL HISTORY Pt lying in bed, no family at bedside. Global aphasia and right hemiparesis. Speech pending, daughter has not decided on GOC yet.   Vitals:   03/06/19 2347 03/07/19 0420 03/07/19 0744 03/07/19 1133  BP: (!) 142/80 (!) 158/70 119/65 (!) 145/86  Pulse: 95 99 92 (!) 103  Resp: 18 20 20 20   Temp: 99.1 F (37.3 C) 99.9 F (37.7 C) 97.6 F (36.4 C) 98.4 F (36.9 C)  TempSrc: Oral Oral Oral Oral  SpO2: 92% 93% 94% 98%  Weight:      Height:        CBC:  Recent Labs  Lab 03/05/19 2024 03/05/19 2030 03/06/19 0108 03/07/19 0348  WBC 11.6*  --   --  11.0*  NEUTROABS 9.5*  --   --   --   HGB 11.4*   < > 10.9* 11.2*  HCT 36.4*   < > 32.0* 34.3*  MCV 96.0  --   --  93.7  PLT 199  --   --  260   < > = values in this interval not displayed.    Basic Metabolic Panel:  Recent Labs  Lab 03/05/19 2024 03/05/19 2024 03/05/19 2030 03/05/19 2030 03/06/19 0108 03/07/19 0348  NA 137   < > 138   < > 140 143  K 4.2   < > 4.2   < > 3.6 3.7  CL 102   < > 103  --   --  110  CO2 26  --   --   --   --  25  GLUCOSE 120*   < > 113*  --   --  116*  BUN 35*   < > 35*  --   --  27*  CREATININE 1.98*   < > 1.90*  --   --  1.61*  CALCIUM 8.8*  --   --   --   --  8.8*  MG  --   --   --   --   --  2.1   < > = values in this interval not displayed.   Lipid Panel:     Component Value Date/Time   CHOL 141 03/06/2019 0500   TRIG 105 03/06/2019 0500   HDL 37 (L) 03/06/2019 0500   CHOLHDL 3.8 03/06/2019 0500   VLDL 21 03/06/2019 0500   LDLCALC 83 03/06/2019 0500   HgbA1c:  Lab Results  Component Value Date   HGBA1C 6.2 (H) 03/06/2019   Urine Drug Screen:     Component Value Date/Time   LABOPIA NONE DETECTED 03/05/2019 2100   COCAINSCRNUR NONE DETECTED 03/05/2019 2100   LABBENZ NONE DETECTED 03/05/2019 2100   AMPHETMU NONE DETECTED 03/05/2019 2100   THCU NONE DETECTED 03/05/2019 2100   LABBARB NONE DETECTED 03/05/2019 2100    Alcohol Level      Component Value Date/Time   ETH <10 03/05/2019 2024    IMAGING past 48 hours DG Pelvis 1-2 Views  Result Date: 03/05/2019 CLINICAL DATA:  Fall, no history of prior injuries or surgery EXAM: PELVIS - 1-2 VIEW COMPARISON:  Angiography abdomen and pelvis 08/06/2013 FINDINGS: The osseous structures appear diffusely demineralized which may limit detection of small or nondisplaced fractures. Bones of the pelvis appear intact and congruent. No abnormal diastasis of the SI joints or symphysis pubis. Femoral heads are normally located. No proximal femoral fractures are seen. Extensive vascular calcium is noted with aortoiliac stent reconstruction. Remaining soft tissues are  unremarkable. IMPRESSION: 1. Diffuse osseous demineralization which may limit detection of small or nondisplaced fractures. 2. No osseous abnormality visualized. 3. Extensive atherosclerosis.  Prior aortoiliac repair. Electronically Signed   By: Lovena Le M.D.   On: 03/05/2019 22:07   CT HEAD WO CONTRAST  Result Date: 03/05/2019 CLINICAL DATA:  86 year old male with dementia and altered mental status. Head trauma. EXAM: CT HEAD WITHOUT CONTRAST CT CERVICAL SPINE WITHOUT CONTRAST TECHNIQUE: Multidetector CT imaging of the head and cervical spine was performed following the standard protocol without intravenous contrast. Multiplanar CT image reconstructions of the cervical spine were also generated. COMPARISON:  Head CT dated 01/09/2016. FINDINGS: CT HEAD FINDINGS Brain: There is mild age-related atrophy and chronic microvascular ischemic changes. There is no acute intracranial hemorrhage. No mass effect midline shift. No extra-axial fluid collection. Vascular: No hyperdense vessel or unexpected calcification. Skull: Normal. Negative for fracture or focal lesion. Sinuses/Orbits: Mild mucoperiosteal thickening of paranasal sinuses. No air-fluid level. The mastoid air cells are clear. Other: None CT CERVICAL SPINE FINDINGS Alignment: No  acute subluxation. Skull base and vertebrae: No acute fracture. Osteopenia. There is lead bony fusion of the posterior ring of C1. Soft tissues and spinal canal: No prevertebral fluid or swelling. No visible canal hematoma. Disc levels: Multilevel degenerative changes with endplate irregularity and disc space narrowing. Upper chest: Emphysema. Other: Bilateral carotid bulb calcified plaques. IMPRESSION: 1. No acute intracranial hemorrhage. Age-related atrophy and chronic microvascular ischemic changes. 2. No acute/traumatic cervical spine pathology. Degenerative changes. Electronically Signed   By: Anner Crete M.D.   On: 03/05/2019 22:11   CT Cervical Spine Wo Contrast  Result Date: 03/05/2019 CLINICAL DATA:  86 year old male with dementia and altered mental status. Head trauma. EXAM: CT HEAD WITHOUT CONTRAST CT CERVICAL SPINE WITHOUT CONTRAST TECHNIQUE: Multidetector CT imaging of the head and cervical spine was performed following the standard protocol without intravenous contrast. Multiplanar CT image reconstructions of the cervical spine were also generated. COMPARISON:  Head CT dated 01/09/2016. FINDINGS: CT HEAD FINDINGS Brain: There is mild age-related atrophy and chronic microvascular ischemic changes. There is no acute intracranial hemorrhage. No mass effect midline shift. No extra-axial fluid collection. Vascular: No hyperdense vessel or unexpected calcification. Skull: Normal. Negative for fracture or focal lesion. Sinuses/Orbits: Mild mucoperiosteal thickening of paranasal sinuses. No air-fluid level. The mastoid air cells are clear. Other: None CT CERVICAL SPINE FINDINGS Alignment: No acute subluxation. Skull base and vertebrae: No acute fracture. Osteopenia. There is lead bony fusion of the posterior ring of C1. Soft tissues and spinal canal: No prevertebral fluid or swelling. No visible canal hematoma. Disc levels: Multilevel degenerative changes with endplate irregularity and disc space  narrowing. Upper chest: Emphysema. Other: Bilateral carotid bulb calcified plaques. IMPRESSION: 1. No acute intracranial hemorrhage. Age-related atrophy and chronic microvascular ischemic changes. 2. No acute/traumatic cervical spine pathology. Degenerative changes. Electronically Signed   By: Anner Crete M.D.   On: 03/05/2019 22:11   MR ANGIO HEAD WO CONTRAST  Result Date: 03/06/2019 CLINICAL DATA:  Initial evaluation for transient ischemic attack. EXAM: MRI HEAD WITHOUT CONTRAST MRA HEAD WITHOUT CONTRAST TECHNIQUE: Multiplanar, multiecho pulse sequences of the brain and surrounding structures were obtained without intravenous contrast. Angiographic images of the head were obtained using MRA technique without contrast. COMPARISON:  Prior CT from 03/05/2019. FINDINGS: MRI HEAD FINDINGS Brain: Examination degraded by motion artifact. Generalized age-related cerebral atrophy with mild chronic small vessel ischemic disease. Confluent area of restricted diffusion involving the anterior left frontal lobe, with involvement of  the left frontal operculum and underlying insula, compatible with acute MCA territory infarct (series 5, image 79). No associated hemorrhage or mass effect. No other evidence for acute or subacute ischemia. Gray-white matter differentiation otherwise maintained. No other areas of discernible remote cortical infarction. No foci of susceptibility artifact to suggest acute or chronic intracranial hemorrhage. No mass lesion, midline shift or mass effect. No hydrocephalus. No extra-axial fluid collection. Pituitary gland suprasellar region normal. Midline structures intact. Vascular: Major intracranial vascular flow voids are grossly maintained at the skull base. Skull and upper cervical spine: Craniocervical junction within normal limits. Bone marrow signal intensity normal. No scalp soft tissue abnormality. Sinuses/Orbits: Patient status post ocular lens replacement on the left. Left gaze  noted. Chronic mucoperiosteal thickening noted within the ethmoidal air cells. Paranasal sinuses are otherwise clear. No mastoid effusion. Inner ear structures grossly normal. Other: None. MRA HEAD FINDINGS ANTERIOR CIRCULATION: Examination degraded by motion artifact. Distal cervical segments of the internal carotid arteries are patent with symmetric antegrade flow. Petrous segments patent bilaterally. Probable atheromatous irregularity throughout the carotid siphons without definite high-grade stenosis. A1 segments patent. Grossly normal anterior communicating artery complex. Anterior cerebral arteries patent to their distal aspects without stenosis. Right M1 widely patent. Grossly negative right MCA bifurcation. Distal right MCA branches perfused. Left M1 appears to bifurcate early and is grossly patent without appreciable stenosis. Grossly negative left MCA bifurcation. There is question of a left M2 and/or M3 branch occlusion (series 16, image 86). Overall, left MCA branches are attenuated as compared to the right. POSTERIOR CIRCULATION: Both vertebral arteries patent to the vertebrobasilar junction without stenosis. Neither PICA of visualized. Basilar tortuous but patent to its distal aspect without appreciable stenosis. Superior cerebral arteries grossly patent proximally. PCAs appear to be largely supplied via the basilar, and are grossly patent to their mid-distal P2 segments. Distal PCAs not well assessed due to motion. IMPRESSION: MRI HEAD IMPRESSION: 1. Motion degraded exam. 2. Moderate sized acute ischemic left MCA territory infarct involving the left insula and overlying left frontal operculum. No associated hemorrhage or significant mass effect. 3. Underlying age-related cerebral atrophy with mild chronic small vessel ischemic disease. MRA HEAD IMPRESSION: 1. Technically limited exam due to extensive motion artifact. 2. Probable left M2/M3 branch occlusion, corresponding with the acute left MCA  territory infarct. 3. Otherwise grossly negative intracranial MRA. No other large vessel occlusion. No hemodynamically significant or correctable stenosis. Electronically Signed   By: Rise MuBenjamin  McClintock M.D.   On: 03/06/2019 02:21   MR BRAIN WO CONTRAST  Result Date: 03/06/2019 CLINICAL DATA:  Initial evaluation for transient ischemic attack. EXAM: MRI HEAD WITHOUT CONTRAST MRA HEAD WITHOUT CONTRAST TECHNIQUE: Multiplanar, multiecho pulse sequences of the brain and surrounding structures were obtained without intravenous contrast. Angiographic images of the head were obtained using MRA technique without contrast. COMPARISON:  Prior CT from 03/05/2019. FINDINGS: MRI HEAD FINDINGS Brain: Examination degraded by motion artifact. Generalized age-related cerebral atrophy with mild chronic small vessel ischemic disease. Confluent area of restricted diffusion involving the anterior left frontal lobe, with involvement of the left frontal operculum and underlying insula, compatible with acute MCA territory infarct (series 5, image 79). No associated hemorrhage or mass effect. No other evidence for acute or subacute ischemia. Gray-white matter differentiation otherwise maintained. No other areas of discernible remote cortical infarction. No foci of susceptibility artifact to suggest acute or chronic intracranial hemorrhage. No mass lesion, midline shift or mass effect. No hydrocephalus. No extra-axial fluid collection. Pituitary gland suprasellar region normal. Midline  structures intact. Vascular: Major intracranial vascular flow voids are grossly maintained at the skull base. Skull and upper cervical spine: Craniocervical junction within normal limits. Bone marrow signal intensity normal. No scalp soft tissue abnormality. Sinuses/Orbits: Patient status post ocular lens replacement on the left. Left gaze noted. Chronic mucoperiosteal thickening noted within the ethmoidal air cells. Paranasal sinuses are otherwise clear.  No mastoid effusion. Inner ear structures grossly normal. Other: None. MRA HEAD FINDINGS ANTERIOR CIRCULATION: Examination degraded by motion artifact. Distal cervical segments of the internal carotid arteries are patent with symmetric antegrade flow. Petrous segments patent bilaterally. Probable atheromatous irregularity throughout the carotid siphons without definite high-grade stenosis. A1 segments patent. Grossly normal anterior communicating artery complex. Anterior cerebral arteries patent to their distal aspects without stenosis. Right M1 widely patent. Grossly negative right MCA bifurcation. Distal right MCA branches perfused. Left M1 appears to bifurcate early and is grossly patent without appreciable stenosis. Grossly negative left MCA bifurcation. There is question of a left M2 and/or M3 branch occlusion (series 16, image 86). Overall, left MCA branches are attenuated as compared to the right. POSTERIOR CIRCULATION: Both vertebral arteries patent to the vertebrobasilar junction without stenosis. Neither PICA of visualized. Basilar tortuous but patent to its distal aspect without appreciable stenosis. Superior cerebral arteries grossly patent proximally. PCAs appear to be largely supplied via the basilar, and are grossly patent to their mid-distal P2 segments. Distal PCAs not well assessed due to motion. IMPRESSION: MRI HEAD IMPRESSION: 1. Motion degraded exam. 2. Moderate sized acute ischemic left MCA territory infarct involving the left insula and overlying left frontal operculum. No associated hemorrhage or significant mass effect. 3. Underlying age-related cerebral atrophy with mild chronic small vessel ischemic disease. MRA HEAD IMPRESSION: 1. Technically limited exam due to extensive motion artifact. 2. Probable left M2/M3 branch occlusion, corresponding with the acute left MCA territory infarct. 3. Otherwise grossly negative intracranial MRA. No other large vessel occlusion. No hemodynamically  significant or correctable stenosis. Electronically Signed   By: Rise Mu M.D.   On: 03/06/2019 02:21   DG Chest Port 1 View  Result Date: 03/05/2019 CLINICAL DATA:  86 year old male with altered mental status. EXAM: PORTABLE CHEST 1 VIEW COMPARISON:  Chest CT dated 05/10/2011. FINDINGS: There is mild cardiomegaly with vascular congestion. No focal consolidation, pleural effusion, or pneumothorax. Median sternotomy wires and CABG vascular clips. Atherosclerotic calcification of the aorta. No acute osseous pathology. IMPRESSION: Cardiomegaly with mild vascular congestion.  No focal consolidation. Electronically Signed   By: Elgie Collard M.D.   On: 03/05/2019 21:19   DG Abd Portable 1 View  Result Date: 03/05/2019 CLINICAL DATA:  MRI clearance EXAM: PORTABLE ABDOMEN - 1 VIEW COMPARISON:  December 29, 2010 FINDINGS: Aorto bi-iliac stent graft is seen. Surgical clips in the left upper quadrant. The bowel gas pattern is normal. No radio-opaque calculi or other significant radiographic abnormality are seen. IMPRESSION: Nonobstructive bowel gas pattern. Aorto bi-iliac stent graft and surgical clips in the left upper quadrant. Electronically Signed   By: Jonna Clark M.D.   On: 03/05/2019 23:18   ECHOCARDIOGRAM COMPLETE  Result Date: 03/06/2019   ECHOCARDIOGRAM REPORT   Patient Name:   BRIGHTEN ORNDOFF Date of Exam: 03/06/2019 Medical Rec #:  [id number]   Height:       69.0 in Accession #:    [id number]  Weight:       195.0 lb Date of Birth:  05/07/1923  BSA:          2.04 m Patient  Age:    89 years    BP:           136/74 mmHg Patient Gender: M           HR:           87 bpm. Exam Location:  Inpatient Procedure: 2D Echo, Cardiac Doppler and Color Doppler Indications:    Stroke 434.94/I163.9  History:        Patient has prior history of Echocardiogram examinations, most                 recent 07/31/2017. CAD and Previous Myocardial Infarction, PAD,                 Arrythmias:Atrial Fibrillation; Risk  Factors:Hypertension,                 Dyslipidemia and Sleep Apnea. Abdominal Aortic Aneurysm.  Sonographer:    Elmarie Shiley Dance Referring Phys: [id number] SYLVESTER I OGBATA IMPRESSIONS  1. Left ventricular ejection fraction, by visual estimation, is 55 to 60%. The left ventricle has normal function. There is mildly increased left ventricular hypertrophy.  2. Left ventricular diastolic parameters are indeterminate.  3. The left ventricle demonstrates regional wall motion abnormalities. Basal inferior and basal inferolateral hypokinesis.  4. Global right ventricle has normal systolic function.The right ventricular size is normal. No increase in right ventricular wall thickness.  5. Left atrial size was moderately dilated.  6. Right atrial size was mildly dilated.  7. Moderate calcification of the mitral valve leaflet(s).  8. Moderate mitral annular calcification.  9. The mitral valve is degenerative. Trivial mitral valve regurgitation. No evidence of mitral stenosis. 10. The tricuspid valve is normal in structure. 11. The aortic valve is bicuspid with fused left and noncoronary cusps. Aortic valve regurgitation is trivial. Probably moderate aortic stenosis. Stenosis is visually moderate and calculated AVA is 1.1 cm^2 though mean gradient is only 13 mmHg. 12. The inferior vena cava is dilated in size with <50% respiratory variability, suggesting right atrial pressure of 15 mmHg. 13. The tricuspid regurgitant velocity is 2.29 m/s, and with an assumed right atrial pressure of 15 mmHg, the estimated right ventricular systolic pressure is mildly elevated at 36.0 mmHg. FINDINGS  Left Ventricle: Left ventricular ejection fraction, by visual estimation, is 55 to 60%. The left ventricle has normal function. The left ventricle demonstrates regional wall motion abnormalities. The left ventricular internal cavity size was the left ventricle is normal in size. There is mildly increased left ventricular hypertrophy. The left ventricular  diastology could not be evaluated due to atrial fibrillation. Left ventricular diastolic parameters are indeterminate. Right Ventricle: The right ventricular size is normal. No increase in right ventricular wall thickness. Global RV systolic function is has normal systolic function. The tricuspid regurgitant velocity is 2.29 m/s, and with an assumed right atrial pressure  of 15 mmHg, the estimated right ventricular systolic pressure is mildly elevated at 36.0 mmHg. Left Atrium: Left atrial size was moderately dilated. Right Atrium: Right atrial size was mildly dilated Pericardium: There is no evidence of pericardial effusion. Mitral Valve: The mitral valve is degenerative in appearance. There is moderate calcification of the mitral valve leaflet(s). Moderate mitral annular calcification. Trivial mitral valve regurgitation. No evidence of mitral valve stenosis by observation. Tricuspid Valve: The tricuspid valve is normal in structure. Tricuspid valve regurgitation is mild. Aortic Valve: The aortic valve is bicuspid. Aortic valve regurgitation is trivial. Moderate aortic stenosis is present. Aortic valve mean gradient measures 13.0 mmHg. Aortic valve  peak gradient measures 20.5 mmHg. Aortic valve area, by VTI measures 1.19 cm. Pulmonic Valve: The pulmonic valve was normal in structure. Pulmonic valve regurgitation is not visualized. Pulmonic regurgitation is not visualized. Aorta: The aortic root is normal in size and structure. Venous: The inferior vena cava is dilated in size with less than 50% respiratory variability, suggesting right atrial pressure of 15 mmHg. IAS/Shunts: No atrial level shunt detected by color flow Doppler.  LEFT VENTRICLE PLAX 2D LVIDd:         4.92 cm LVIDs:         3.94 cm LV PW:         1.02 cm LV IVS:        1.12 cm LVOT diam:     2.20 cm LV SV:         46 ml LV SV Index:   22.12 LVOT Area:     3.80 cm  RIGHT VENTRICLE          IVC RV Basal diam:  3.57 cm  IVC diam: 2.77 cm RV Mid diam:     2.30 cm TAPSE (M-mode): 1.9 cm LEFT ATRIUM              Index       RIGHT ATRIUM           Index LA diam:        4.70 cm  2.30 cm/m  RA Area:     23.90 cm LA Vol (A2C):   113.0 ml 55.29 ml/m RA Volume:   75.70 ml  37.04 ml/m LA Vol (A4C):   68.4 ml  33.47 ml/m LA Biplane Vol: 88.0 ml  43.06 ml/m  AORTIC VALVE AV Area (Vmax):    1.16 cm AV Area (Vmean):   1.15 cm AV Area (VTI):     1.19 cm AV Vmax:           226.33 cm/s AV Vmean:          159.500 cm/s AV VTI:            0.493 m AV Peak Grad:      20.5 mmHg AV Mean Grad:      13.0 mmHg LVOT Vmax:         69.05 cm/s LVOT Vmean:        48.300 cm/s LVOT VTI:          0.154 m LVOT/AV VTI ratio: 0.31  AORTA Ao Root diam: 3.60 cm Ao Asc diam:  3.10 cm MITRAL VALVE                        TRICUSPID VALVE MV Area (PHT): 3.85 cm             TR Peak grad:   21.0 mmHg MV PHT:        57.13 msec           TR Vmax:        229.00 cm/s MV Decel Time: 197 msec MV E velocity: 118.00 cm/s 103 cm/s SHUNTS                                     Systemic VTI:  0.15 m  Systemic Diam: 2.20 cm  Marca Ancona MD Electronically signed by Marca Ancona MD Signature Date/Time: 03/06/2019/1:53:11 PM    Final     PHYSICAL EXAM    Temp:  [97.6 F (36.4 C)-99.9 F (37.7 C)] 98.4 F (36.9 C) (01/14 1133) Pulse Rate:  [87-103] 103 (01/14 1133) Resp:  [18-20] 20 (01/14 1133) BP: (119-158)/(65-86) 145/86 (01/14 1133) SpO2:  [92 %-98 %] 98 % (01/14 1133)  General - Well nourished, well developed, in no apparent distress, mild agitation.  Ophthalmologic - fundi not visualized due to noncooperation. Right eye ectropion, redness of conjunctiva  Cardiovascular - irregularly irregular heart rate and rhythm.  Neuro - awake, eyes open, tracking on the left visual field, global aphasia, not following commands. PERRL, left gaze preference, and able to cross midline today. Inconsistently blinking to visual threat bilaterally. Right facial droop. Tongue  protrusion not cooperative. LUE 5/5 and spontaneous against gravities. LLE 3/5 at least and spontaneous movement. RUE 2+/5 bicep on pain stimulation, RLE mild withdraw to pain, 2/5. Right babinski positive. Sensation, coordination and gait not tested.   ASSESSMENT/PLAN Corey Mcdonald is a 86 y.o. male with history of atrial fibrillation on Eliquis, AAA, coronary artery disease, hypertension, hyperlipidemia, macular degeneration with blindness in both eyes, peripheral vascular disease, sleep apnea, restless leg syndrome, dementia, at baseline using a walker to walk presenting with speech difficulties.   Stroke: Moderate L MCA territory infarct embolic d/t atrial fibrillation even on Eliquis 2.5  CT head No acute abnormality. Small vessel disease. Atrophy.  MRI  Moderate L MCA territory infarct (L insula, L frontal operculum) Small vessel disease. Atrophy.   MRA  probable L M2/M3 occlusion   2D Echo EF 55-60%  LDL 83  HgbA1c 6.2  SCDs for VTE prophylaxis  Eliquis (apixaban) daily 2.5 bid prior to admission, now on aspirin 300 mg suppository daily. Further recs depending on pt GOC discussion  Therapy recommendations:  SNF  Disposition:  pending GOC discussion with daughter  Atrial Fibrillation  Home anticoagulation:  Eliquis (apixaban) daily 2.5 bid  On ASA  Hold off AC for now given moderate sized stroke  Further AC recs depending pt GOC discussion   Hypertension  Stable . Permissive hypertension (OK if < 220/120) but gradually normalize in 5-7 days . Long-term BP goal normotensive  Hyperlipidemia  Home meds:  lipitor 20  Now statin on hold due to no po access  LDL 83, goal < 70  Dysphagia Secondary to stroke NPO Speech on board Not awake enough for PO trials  . Pending daughter GOC discussion   Other Stroke Risk Factors  Advanced age  Former Cigarette smoker, quit 32 yrs ago  Hx stroke/TIA  Family hx stroke (father, brother)  Coronary artery  disease s/p MI  Obstructive sleep apnea  PAD/PVD  Other Active Problems  Baseline dementia w/ sundowning  CKD stage 3b Cre 1.98->1.9->1.61  Leukocytosis WBC 11.6->11.0  Urinary retention  Hospital day # 1  Marvel Plan, MD PhD Stroke Neurology 03/07/2019 3:38 PM   To contact Stroke Continuity provider, please refer to [email or web address]. After hours, contact General Neurology

## 2019-03-07 NOTE — Progress Notes (Signed)
SLP Cancellation Note  Patient Details Name: Torrez Renfroe MRN: [id number] DOB: 11/17/1923   Cancelled treatment:        Patient very lethargic. Will open eyes, but not arouse enough for PO trials. Will see as schedule permits.    Luis Abed. MA CCC-SLP 03/07/2019, 12:39 PM

## 2019-03-07 NOTE — Progress Notes (Signed)
Physical Therapy Treatment Patient Details Name: Corey Mcdonald MRN: [id number] DOB: Jun 06, 1923 Today's Date: 03/07/2019    History of Present Illness Patient is a 86 year old Caucasian male with past medical history significant for coronary artery disease, atrial fibrillation on Eliquis, abdominal aortic aneurysm, chronic kidney disease stage IIIb, peptic ulcer disease, PVD, MI, restless leg syndrome, sleep apnea, hypertension and hyperlipidemia, macular degeneration, legally blind.  Admitted with decreased verbal responses, CT positive for L frontal acute CVA.    PT Comments    Pt performed supine to sit and sit to stand transfers with +2 external assistance.  He is able to move RUE more proximally and follow one step commands.  He did have a BM during session but unable to urinate.  Pt continues to benefit from snf placement based on his deficits.  He was very motivated to work with PT/OT today.     Follow Up Recommendations  SNF     Equipment Recommendations  None recommended by PT    Recommendations for Other Services       Precautions / Restrictions Precautions Precautions: Fall Precaution Comments: R inattention, L gaze Restrictions Weight Bearing Restrictions: No    Mobility  Bed Mobility Overal bed mobility: Needs Assistance Bed Mobility: Supine to Sit     Supine to sit: Max assist     General bed mobility comments: Max assistance to elevate trunk into sitting.  He required moderate assistance to move LLE and max assistance to move RLE.  Once in sitting he stays to the R side, appears to be over compensating to avoid midline.  Transfers Overall transfer level: Needs assistance Equipment used: Ambulation equipment used(sara stedy.) Transfers: Sit to/from Stand Sit to Stand: Mod assist;+2 physical assistance         General transfer comment: Mod assistance to boost into standing and maintain position of RUE to promote weight bearing.  He leans to the R  side.  Ambulation/Gait Ambulation/Gait assistance: (NT but noted weight shifting in standing within sara stedy.)               Stairs             Wheelchair Mobility    Modified Rankin (Stroke Patients Only)       Balance Overall balance assessment: Needs assistance   Sitting balance-Leahy Scale: Poor       Standing balance-Leahy Scale: Poor                              Cognition Arousal/Alertness: Lethargic Behavior During Therapy: WFL for tasks assessed/performed Overall Cognitive Status: Difficult to assess                                 General Comments: Pt continues to follow one step commands.  He is trying to voice and responds with yes no head nods.      Exercises      General Comments        Pertinent Vitals/Pain Pain Assessment: Faces Faces Pain Scale: No hurt    Home Living                      Prior Function            PT Goals (current goals can now be found in the care plan section) Acute Rehab PT Goals Patient Stated Goal: To get up  when he can Potential to Achieve Goals: Fair Progress towards PT goals: Progressing toward goals    Frequency    Min 3X/week      PT Plan Current plan remains appropriate    Co-evaluation PT/OT/SLP Co-Evaluation/Treatment: Yes Reason for Co-Treatment: Complexity of the patient's impairments (multi-system involvement);For patient/therapist safety;Necessary to address cognition/behavior during functional activity PT goals addressed during session: Mobility/safety with mobility OT goals addressed during session: ADL's and self-care      AM-PAC PT "6 Clicks" Mobility   Outcome Measure  Help needed turning from your back to your side while in a flat bed without using bedrails?: Total Help needed moving from lying on your back to sitting on the side of a flat bed without using bedrails?: Total Help needed moving to and from a bed to a chair (including a  wheelchair)?: Total Help needed standing up from a chair using your arms (e.g., wheelchair or bedside chair)?: Total Help needed to walk in hospital room?: Total Help needed climbing 3-5 steps with a railing? : Total 6 Click Score: 6    End of Session Equipment Utilized During Treatment: Gait belt Activity Tolerance: Patient tolerated treatment well Patient left: in chair;with call bell/phone within reach(noi chair alarms on unit and sitter alarm belt would not fit around bariatric chair.) Nurse Communication: Mobility status PT Visit Diagnosis: Other abnormalities of gait and mobility (R26.89);Other symptoms and signs involving the nervous system (R29.898)     Time: 6269-4854 PT Time Calculation (min) (ACUTE ONLY): 37 min  Charges:  $Therapeutic Activity: 8-22 mins                     Bonney Leitz , PTA Acute Rehabilitation Services Pager [phone] Office [phone]     Oswell Say Artis Delay 03/07/2019, 11:30 AM

## 2019-03-07 NOTE — Progress Notes (Addendum)
OT Cancellation Note  Patient Details Name: Corey Mcdonald MRN: [id number] DOB: 09/22/1923   Cancelled Treatment:     **New order given after this note taken- pt was beginning to follow commands with PT. OT reordered and formal evaluation to follow.    Reason Eval/Treat Not Completed: Patient not medically ready;Other (comment) Per chart review, mentions of possible hospice/palliative discussions. Pt is globally aphasic, not following commands and has been lethargic. Spoke with RN whom stated palliative is planning for family meeting to discuss GOC. Given current pt status, OT will sign off at this time. Depending GOC discussed and improvement, please re order OT as necessary. Thank you.  Dalphine Handing, MSOT, OTR/L Acute Rehabilitation Services [facility] Office Number: [phone]  Dalphine Handing 03/07/2019, 10:29 AM

## 2019-03-07 NOTE — Progress Notes (Signed)
Physical Therapy Treatment Patient Details Name: Corey Mcdonald MRN: [id number] DOB: 08/20/1923 Today's Date: 03/07/2019    History of Present Illness Patient is a 86 year old Caucasian male with past medical history significant for coronary artery disease, atrial fibrillation on Eliquis, abdominal aortic aneurysm, chronic kidney disease stage IIIb, peptic ulcer disease, PVD, MI, restless leg syndrome, sleep apnea, hypertension and hyperlipidemia, macular degeneration, legally blind.  Admitted with decreased verbal responses, CT positive for L frontal acute CVA.    PT Comments    PTA assisted patient back to bed after nurse requesting assistance to mobilize patient.  He tolerated sitting in chair x 4 hours.  He was still able to participate in transfer training with good effort for back to bed.  Daughter present at bed side.  Continue to recommend SNF placement.    Follow Up Recommendations  SNF     Equipment Recommendations  None recommended by PT    Recommendations for Other Services       Precautions / Restrictions Precautions Precautions: Fall Precaution Comments: R inattention, L gaze Restrictions Weight Bearing Restrictions: No    Mobility  Bed Mobility Overal bed mobility: Needs Assistance Bed Mobility: Sit to Supine       Sit to supine: Max assist;+2 for safety/equipment   General bed mobility comments: Assistance to lower trunk back to bed and elevate feet back into his bed.  Transfers Overall transfer level: Needs assistance Equipment used: Ambulation equipment used(sara stedy) Transfers: Sit to/from Stand Sit to Stand: Mod assist;+2 physical assistance         General transfer comment: mod A +2 with increased support at RUE in stedy, Pt able to rise from chair and sit on stedy plates for transfer back to bed.  Ambulation/Gait                 Stairs             Wheelchair Mobility    Modified Rankin (Stroke Patients Only)        Balance Overall balance assessment: Needs assistance Sitting-balance support: Single extremity supported Sitting balance-Leahy Scale: Poor Sitting balance - Comments: difficulty maintaining sitting balance without environmental changes/support   Standing balance support: Single extremity supported;Bilateral upper extremity supported;During functional activity Standing balance-Leahy Scale: Poor Standing balance comment: reliant on stedy and assist from therapists on R side                            Cognition Arousal/Alertness: Lethargic Behavior During Therapy: WFL for tasks assessed/performed Overall Cognitive Status: Difficult to assess                                 General Comments: pt following one step commands with increased processing time and repitition this date. Was able to repeat back his name (still unclear) and nod yes and no occasionally throughout session      Exercises      General Comments        Pertinent Vitals/Pain Pain Assessment: Faces Faces Pain Scale: No hurt    Home Living Family/patient expects to be discharged to:: Private residence Living Arrangements: Alone Available Help at Discharge: Family;Available PRN/intermittently Type of Home: Independent living facility Home Access: Level entry   Home Layout: One level Home Equipment: Walker - 2 wheels;Bedside commode;Grab bars - tub/shower;Grab bars - toilet;Cane - single point      Prior Function  Level of Independence: Independent with assistive device(s)      Comments: uses walker, has one meal delivered a day and can retrieve and set up on his own, gets his own breakfast which family set up for him, has caregiver 7 d/week 5-8:30 due to sundowning and help with dinner; sleeps a lot   PT Goals (current goals can now be found in the care plan section) Acute Rehab PT Goals Patient Stated Goal: To get up when he can Potential to Achieve Goals: Fair Progress towards PT  goals: Progressing toward goals    Frequency    Min 3X/week      PT Plan Current plan remains appropriate    Co-evaluation   Reason for Co-Treatment: Complexity of the patient's impairments (multi-system involvement);Necessary to address cognition/behavior during functional activity;For patient/therapist safety;To address functional/ADL transfers PT goals addressed during session: Mobility/safety with mobility OT goals addressed during session: ADL's and self-care;Proper use of Adaptive equipment and DME;Strengthening/ROM      AM-PAC PT "6 Clicks" Mobility   Outcome Measure  Help needed turning from your back to your side while in a flat bed without using bedrails?: Total Help needed moving from lying on your back to sitting on the side of a flat bed without using bedrails?: Total Help needed moving to and from a bed to a chair (including a wheelchair)?: A Lot Help needed standing up from a chair using your arms (e.g., wheelchair or bedside chair)?: Total Help needed to walk in hospital room?: Total Help needed climbing 3-5 steps with a railing? : Total 6 Click Score: 7    End of Session Equipment Utilized During Treatment: Gait belt Activity Tolerance: Patient tolerated treatment well Patient left: in chair;with call bell/phone within reach Nurse Communication: Mobility status PT Visit Diagnosis: Other abnormalities of gait and mobility (R26.89);Other symptoms and signs involving the nervous system (R29.898)     Time: 1422-1430 PT Time Calculation (min) (ACUTE ONLY): 8 min  Charges:  $Therapeutic Activity: 8-22 mins                     Bonney Leitz , PTA Acute Rehabilitation Services Pager [phone] Office [phone]     Rodger Giangregorio Artis Delay 03/07/2019, 6:05 PM

## 2019-03-07 NOTE — Progress Notes (Signed)
PHARMACY - PHYSICIAN COMMUNICATION CRITICAL VALUE ALERT - BLOOD CULTURE IDENTIFICATION (BCID)  Corey Mcdonald is an 85 y.o. male who presented to [facility] on 03/05/2019 with a chief complaint of aphasia/CVA  Assessment:  1/2 blood cultures growing Gram positive cocci.   Afebrile.  Likely contaminant.  Name of physician (or Provider) Contacted: Donnamarie Poag  Current antibiotics: None  Changes to prescribed antibiotics recommended:  None at this time  No results found for this or any previous visit.  Eddie Candle 03/07/2019  12:01 AM

## 2019-03-08 DIAGNOSIS — R4701 Aphasia: Secondary | ICD-10-CM

## 2019-03-08 DIAGNOSIS — Z515 Encounter for palliative care: Secondary | ICD-10-CM

## 2019-03-08 DIAGNOSIS — Z7189 Other specified counseling: Secondary | ICD-10-CM

## 2019-03-08 LAB — GLUCOSE, CAPILLARY
Glucose-Capillary: 105 mg/dL — ABNORMAL HIGH (ref 70–99)
Glucose-Capillary: 111 mg/dL — ABNORMAL HIGH (ref 70–99)
Glucose-Capillary: 113 mg/dL — ABNORMAL HIGH (ref 70–99)
Glucose-Capillary: 125 mg/dL — ABNORMAL HIGH (ref 70–99)

## 2019-03-08 MED ORDER — GLYCOPYRROLATE 1 MG PO TABS
1.0000 mg | ORAL_TABLET | ORAL | Status: DC | PRN
  Filled 2019-03-08: qty 1

## 2019-03-08 MED ORDER — HALOPERIDOL LACTATE 2 MG/ML PO CONC
0.5000 mg | ORAL | Status: DC | PRN
  Filled 2019-03-08: qty 0.3

## 2019-03-08 MED ORDER — MORPHINE SULFATE (PF) 2 MG/ML IV SOLN: 1.0000 mg | INTRAVENOUS | Status: DC | PRN

## 2019-03-08 MED ORDER — ASPIRIN 325 MG PO TBEC: 325.0000 mg | DELAYED_RELEASE_TABLET | Freq: Every day | ORAL | 0 refills | Status: AC

## 2019-03-08 MED ORDER — HALOPERIDOL LACTATE 5 MG/ML IJ SOLN: 0.5000 mg | INTRAMUSCULAR | Status: DC | PRN

## 2019-03-08 MED ORDER — ONDANSETRON 4 MG PO TBDP: 4.0000 mg | ORAL_TABLET | Freq: Four times a day (QID) | ORAL | Status: DC | PRN

## 2019-03-08 MED ORDER — HALOPERIDOL 0.5 MG PO TABS: 0.5000 mg | ORAL_TABLET | ORAL | Status: DC | PRN

## 2019-03-08 MED ORDER — ONDANSETRON HCL 4 MG/2ML IJ SOLN: 4.0000 mg | Freq: Four times a day (QID) | INTRAMUSCULAR | Status: DC | PRN

## 2019-03-08 MED ORDER — GLYCOPYRROLATE 0.2 MG/ML IJ SOLN: 0.2000 mg | INTRAMUSCULAR | Status: DC | PRN

## 2019-03-08 MED ORDER — BIOTENE DRY MOUTH MT LIQD: 15.0000 mL | OROMUCOSAL | Status: DC | PRN

## 2019-03-08 MED ORDER — MORPHINE SULFATE 15 MG PO TABS
7.5000 mg | ORAL_TABLET | ORAL | Status: DC | PRN
  Administered 2019-03-08: 7.5 mg via ORAL
  Filled 2019-03-08: qty 1

## 2019-03-08 MED ORDER — POLYVINYL ALCOHOL 1.4 % OP SOLN
1.0000 [drp] | Freq: Four times a day (QID) | OPHTHALMIC | Status: DC | PRN
  Filled 2019-03-08: qty 15

## 2019-03-08 NOTE — Consult Note (Signed)
Consultation Note Date: 03/08/2019   Patient Name: Corey Mcdonald  DOB: Mar 31, 1923  MRN: [id number]  Age / Sex: 86 y.o., male  PCP: Shirline Frees, NP Referring Physician: Joycelyn Das, MD  Reason for Consultation: Establishing goals of care, Hospice Evaluation and Psychosocial/spiritual support  HPI/Patient Profile: 86 y.o. male  with past medical history of peripheral arterial disease, coronary artery disease, atrial fibrillation, AAA, peptic ulcer disease with GI bleed, obstructive sleep apnea, vertigo, CVA, chronic kidney disease stage IIIb, dementia and blindness from macular degeneration who was admitted on 03/05/2019 with the inability to speak.  Work-up revealed a left frontal acute CVA.  He was found to have profound dysarthria and right hemiparesis.  Over the past 2 days since admission his functional capability has declined.  He is now lethargic and sleeping most of the time.  He failed his swallow evaluation and has been put on comfort feeds.  Clinical Assessment and Goals of Care:  I have reviewed medical records including EPIC notes, labs and imaging, received report from , spoke on the phone with his daughter Corey Mcdonald  to discuss diagnosis prognosis, GOC, EOL wishes, disposition and options.  I introduced Palliative Medicine as specialized medical care for people living with serious illness. It focuses on providing relief from the symptoms and stress of a serious illness.   We discussed a brief life review of the patient. Mr. Dirk has always been very easy going.  Never a complainer.  He worked in [employer] for many years.  He has been vision impaired for 40 years due to macular degeneration.  He has two children.  He is [employer].  His daughter Corey Mcdonald is his HC and durable POA.  As far as functional and nutritional status despite being blind he was living in independent living prior  to the CVA.  He had assistance in the evening from 5 - 8 as he had some issues with sun downing.  We discussed his current illness and what it means in the larger context of his on-going co-morbidities.  Natural disease trajectory and expectations at EOL were discussed.  Corey Mcdonald understands her father is approaching EOL.  She wants him comfortable and well cared for.  We discussed hospice services and hospice house.  Advanced directives, concepts specific to code status, artifical feeding and hydration, and rehospitalization were considered and discussed.  Hospice and Palliative Care services outpatient were explained and offered.  Questions and concerns were addressed.  The family was encouraged to call with questions or concerns.    Primary Decision Maker:  NEXT OF KIN daughter Corey Mcdonald.    SUMMARY OF RECOMMENDATIONS     Will shift goal to comfort.  Change orders and dc anything not related to comfort.  Discussed with CSW - Hospice facility nearest to [place], [place].  DC to [facility] as soon as bed is available.  Comfort feeds with aspiration precautions.  Code Status/Advance Care Planning:  DNR   Palliative Prophylaxis:   Frequent Pain Assessment and Oral Care  Psycho-social/Spiritual:  Desire for further Chaplaincy support:  welcomed  Prognosis:  Days to less than 2 weeks.   Discharge Planning: Hospice facility      Primary Diagnoses: Present on Admission: . Acute CVA (cerebrovascular accident) ([place])   I have reviewed the medical record, interviewed the patient and family, and examined the patient. The following aspects are pertinent.  Past Medical History:  Diagnosis Date  . AAA (abdominal aortic aneurysm) ([place])   . ABDOMINAL AORTIC ANEURYSM   . Anemia January 2012   admitted with acute MI March 15, 2010 and has significant acute blood loss anemia requiring transfusions of two units of packed RBCs.  Status post upper endoscopy March 22, 2010 without  High-Risk bleeding lesion.  History of peptic ulcer disease  . Atrial fibrillation ([place])   . BENIGN PROSTATIC HYPERTROPHY, HX OF    . COLONIC POLYPS, HX OF   . CORONARY ARTERY DISEASE    non-ST segment MI, March 15, 2010  . History of Doppler ultrasound 05/2011   h/o claudication, stable ABIs  . History of nuclear stress test 2007   persantine; normal, low risk   . HYPERLIPIDEMIA   . HYPERTENSION   . MACULAR DEGENERATION   . Myocardial infarction ([place]) 02/2010  . PAD (peripheral artery disease) ([place])   . PEPTIC ULCER DISEASE, HX OF   . PVD (peripheral vascular disease) ([place])   . RESTLESS LEGS SYNDROME   . SLEEP APNEA   . VERTIGO    Social History   Socioeconomic History  . Marital status: Married    Spouse name: Not on file  . Number of children: 2  . Years of education: Not on file  . Highest education level: Not on file  Occupational History  . Not on file  Tobacco Use  . Smoking status: Former Smoker    Packs/day: 1.00    Years: 40.00    Pack years: 40.00    Types: Cigarettes    Quit date: 02/22/1987    Years since quitting: 32.0  . Smokeless tobacco: Never Used  Substance and Sexual Activity  . Alcohol use: No  . Drug use: No  . Sexual activity: Not on file  Other Topics Concern  . Not on file  Social History Narrative  . Not on file   Social Determinants of Health   Financial Resource Strain:   . Difficulty of Paying Living Expenses: Not on file  Food Insecurity:   . Worried About Charity fundraiser in the Last Year: Not on file  . Ran Out of Food in the Last Year: Not on file  Transportation Needs:   . Lack of Transportation (Medical): Not on file  . Lack of Transportation (Non-Medical): Not on file  Physical Activity:   . Days of Exercise per Week: Not on file  . Minutes of Exercise per Session: Not on file  Stress:   . Feeling of Stress : Not on file  Social Connections:   . Frequency of Communication with Friends and Family: Not on file  .  Frequency of Social Gatherings with Friends and Family: Not on file  . Attends Religious Services: Not on file  . Active Member of Clubs or Organizations: Not on file  . Attends Archivist Meetings: Not on file  . Marital Status: Not on file   Family History  Problem Relation Age of Onset  . Heart disease Mother   . Heart disease Father   . Stroke Father   . Heart disease  Sister   . Macular degeneration Sister   . Heart disease Sister   . Stroke Brother   . Hypertension Sister   . Heart Problems Sister        x2   Scheduled Meds: .  stroke: mapping our early stages of recovery book   Does not apply Once  . aspirin EC  325 mg Oral Daily   Or  . aspirin  300 mg Rectal Daily  . Chlorhexidine Gluconate Cloth  6 each Topical Daily  . docusate sodium  100 mg Oral QAC supper  . tamsulosin  0.4 mg Oral QPM   Continuous Infusions: . sodium chloride 50 mL/hr at 03/08/19 0155   PRN Meds:.acetaminophen **OR** acetaminophen (TYLENOL) oral liquid 160 mg/5 mL **OR** acetaminophen, antiseptic oral rinse, glycopyrrolate **OR** glycopyrrolate **OR** glycopyrrolate, haloperidol **OR** haloperidol **OR** haloperidol lactate, morphine injection, ondansetron **OR** ondansetron (ZOFRAN) IV, polyvinyl alcohol, senna-docusate Allergies  Allergen Reactions  . Cilostazol Other (See Comments)    Adverse reaction per patient - leg cramps  . Doxazosin Other (See Comments)    Leg cramps   Review of Systems patient unable to speak  Physical Exam  Elderly male, awake, will follow some one step commands, but appears lethargic CV irregular rate and rhythm Resp no distress but irregular breathing pattern Abd soft, nt, nd Ext  - right sided paresis  Vital Signs: BP (!) 145/81 (BP Location: Left Arm)   Pulse 79   Temp 98.9 F (37.2 C) (Oral)   Resp 17   Ht 5\' 9"  (1.753 m)   Wt 88.5 kg   SpO2 95%   BMI 28.80 kg/m  Pain Scale: Faces   Pain Score: Asleep   SpO2: SpO2: 95 % O2  Device:SpO2: 95 % O2 Flow Rate: .   IO: Intake/output summary:   Intake/Output Summary (Last 24 hours) at 03/08/2019 1616 Last data filed at 03/08/2019 0053 Gross per 24 hour  Intake --  Output 1400 ml  Net -1400 ml    LBM: Last BM Date: 03/07/19 Baseline Weight: Weight: 88.5 kg Most recent weight: Weight: 88.5 kg     Palliative Assessment/Data: 20%     Time In: 1:30 Time Out: 2:30 Time Total: 60 min. Visit consisted of counseling and education dealing with the complex and emotionally intense issues surrounding the need for palliative care and symptom management in the setting of serious and potentially life-threatening illness. Greater than 50%  of this time was spent counseling and coordinating care related to the above assessment and plan.  Signed by: 03/09/19, PA-C Palliative Medicine Pager: [phone]  Please contact Palliative Medicine Team phone at [phone] for questions and concerns.  For individual provider: See [phone]

## 2019-03-08 NOTE — Progress Notes (Signed)
PROGRESS NOTE  Corey Mcdonald ZOX:[id number] DOB: 1924/01/20 DOA: 03/05/2019 PCP: [name], [name], NP   LOS: 2 days   Brief narrative: As per HPI,  Patient is a 86 year old Caucasian male with past medical history significant for coronary artery disease, atrial fibrillation on Eliquis 2.5 Mg p.o. twice daily, abdominal aortic aneurysm, chronic kidney disease stage IIIb, peptic ulcer disease, PVD, MI, restless leg syndrome, sleep apnea, hypertension and hyperlipidemia.  Patient is a resident at an assisted living facility.  Patient's carer observed the patient was no longer able to speak earlier today (around 5 PM).  Patient was also noted to be awake.  EMR was activated.  On presentation to the hospital, CT head did not reveal any acute findings.  MRI brain, as per neurology note, a CT revealed moderate size left frontal acute stroke.  ED Course: On presentation to the emergency room, temperature is 97.4, blood pressure 140/77, heart rate of 83, respiratory rate of 20 with O2 sat of 100% on room air.  CBC reveals WBC of 11.6, hemoglobin of 11.4, hematocrit of 36.4, MCV of 96 with platelet counts of 199.  Chemistry reveals sodium of 137, potassium of 4.2, chloride 102, CO2 of 26, BUN of 35, creatinine of 1.98, with a blood sugar of 113.  Patient was admitted for further assessment and management.  Assessment/Plan:  Active Problems:   Acute CVA (cerebrovascular accident) (HCC)  Assessment and Plan.  Acute CVA likely cardioembolic from history of atrial fibrillation. MRI of the brain shows moderate sized left MCA territory infarct with possible M2 M3 branch occlusion. Patient with the dementia at baseline. He was on  anticoagulation with eliquis at baseline but despite that he sustained ischemic stroke. On aspirin, Apixaban on hold.   Patient was outside the TPA window on arrival.  Due to poor baseline modified Rankin score, he was not a candidate for intervention.   2D echocardiogram with EF of  55-60%. Palliative care consulted for goals of care, still pending evaluation..  Other recommendations will depend on the goals of care and discussion with palliative care. Patient has poor prognosis and will benefit from hospice level of care.  Urinary retention. Status post foley catheter . We will continue for urinary retention and comfort.  Coagulase negative staph in 1 bottle.  Likely contaminant.  Dementia Continue to provide supportive care.  Essential hypertension:   Continue permissive hypertension at this time  Hyperlipidemia: statin if able to swallow. Unsafe swallow so far  History of chronic atrial fibrillation.   Apixaban on hold.  Continue aspirin via suppository. Mildly tachycardic today  CKD 3B: Monitor BMP.  History of CAD/MI: No acute issues at this time.   VTE Prophylaxis:  Lovenox subq Cyst Code Status:  DNR  Family Communication: None today. I had a prolonged discussion with the patient's daughter yesterday. Awaiting for palliative care to assess the patient and discuss with the family.  Disposition Plan: Patient has poor prognosis. Patient will benefit from hospice level of care. SNF as per PT, uncertain depending on clinical status and goals of care discussions  Consultants:  Neurology  Palliative care-consulted   Procedures:  None  Antibiotics:  Anti-infectives (From admission, onward)   None     Subjective: Today, patient's condition has not changed. He appears to be agitated at times. Nonverbal. No improvement in his mentation.  Objective: Vitals:   03/08/19 0043 03/08/19 0308  BP: (!) 162/87 (!) 155/94  Pulse: (!) 103 98  Resp: 19 20  Temp: 98.6  F (37 C) 98.5 F (36.9 C)  SpO2: 98% 96%    Intake/Output Summary (Last 24 hours) at 03/08/2019 0802 Last data filed at 03/08/2019 0053 Gross per 24 hour  Intake 1403.48 ml  Output 1725 ml  Net -321.52 ml   Filed Weights   03/05/19 2038  Weight: 88.5 kg   Body mass  index is 28.8 kg/m.   Physical Exam:   GENERAL: Patient is mildly agitated, right eye is congested with discharge. Appears to be mildly agitated. HENT: Oral mucosa is mildly dry. NECK: is supple, no palpable thyroid enlargement. CHEST: No obvious wheezes noted. Diminished breath sounds bilaterally. CVS: S1 and S2 heard, no murmur.  Mildly tachycardic.  ABDOMEN: Soft, non-tender, bowel sounds are present.  Condom cath in place EXTREMITIES: No edema. CNS: Patient is agitated, appears uncomfortable.  Nonverbal. SKIN: warm and dry without rashes.  Data Review: I have personally reviewed the following laboratory data and studies,  CBC: Recent Labs  Lab 03/05/19 2024 03/05/19 2030 03/06/19 0108 03/07/19 0348  WBC 11.6*  --   --  11.0*  NEUTROABS 9.5*  --   --   --   HGB 11.4* 11.2* 10.9* 11.2*  HCT 36.4* 33.0* 32.0* 34.3*  MCV 96.0  --   --  93.7  PLT 199  --   --  260   Basic Metabolic Panel: Recent Labs  Lab 03/05/19 2024 03/05/19 2030 03/06/19 0108 03/07/19 0348  NA 137 138 140 143  K 4.2 4.2 3.6 3.7  CL 102 103  --  110  CO2 26  --   --  25  GLUCOSE 120* 113*  --  116*  BUN 35* 35*  --  27*  CREATININE 1.98* 1.90*  --  1.61*  CALCIUM 8.8*  --   --  8.8*  MG  --   --   --  2.1   Liver Function Tests: Recent Labs  Lab 03/05/19 2024  AST 22  ALT 23  ALKPHOS 53  BILITOT 0.6  PROT 7.1  ALBUMIN 3.3*   No results for input(s): LIPASE, AMYLASE in the last 168 hours. No results for input(s): AMMONIA in the last 168 hours. Cardiac Enzymes: Recent Labs  Lab 03/05/19 2024  CKTOTAL 90   BNP (last 3 results) No results for input(s): BNP in the last 8760 hours.  ProBNP (last 3 results) No results for input(s): PROBNP in the last 8760 hours.  CBG: Recent Labs  Lab 03/07/19 1135 03/07/19 1818 03/08/19 0043 03/08/19 0051 03/08/19 0557  GLUCAP 104* 89 111* 105* 113*   Recent Results (from the past 240 hour(s))  Urine culture     Status: None    Collection Time: 03/05/19  9:00 PM   Specimen: Urine, Catheterized  Result Value Ref Range Status   Specimen Description URINE, CATHETERIZED  Final   Special Requests NONE  Final   Culture   Final    NO GROWTH Performed at [facility], [address], [place], [place] [address]    Report Status 03/06/2019 FINAL  Final  SARS CORONAVIRUS 2 (TAT 6-24 HRS) Nasopharyngeal Nasopharyngeal Swab     Status: None   Collection Time: 03/05/19 10:31 PM   Specimen: Nasopharyngeal Swab  Result Value Ref Range Status   SARS Coronavirus 2 NEGATIVE NEGATIVE Final    Comment: (NOTE) SARS-CoV-2 target nucleic acids are NOT DETECTED. The SARS-CoV-2 RNA is generally detectable in upper and lower respiratory specimens during the acute phase of infection. Negative results  do not preclude SARS-CoV-2 infection, do not rule out co-infections with other pathogens, and should not be used as the sole basis for treatment or other patient management decisions. Negative results must be combined with clinical observations, patient history, and epidemiological information. The expected result is Negative. Fact Sheet for Patients: HairSlick.no Fact Sheet for Healthcare Providers: quierodirigir.com This test is not yet approved or cleared by the [place] FDA and  has been authorized for detection and/or diagnosis of SARS-CoV-2 by FDA under an Emergency Use Authorization (EUA). This EUA will remain  in effect (meaning this test can be used) for the duration of the COVID-19 declaration under Section 56 4(b)(1) of the Act, 21 U.S.C. section 360bbb-3(b)(1), unless the authorization is terminated or revoked sooner. Performed at [facility], [address] [place], [place] [address]   Culture, blood (routine x 2)     Status: None (Preliminary result)   Collection Time: 03/06/19  2:39 AM   Specimen: BLOOD  Result Value Ref Range Status    Specimen Description BLOOD RIGHT ANTECUBITAL  Final   Special Requests   Final    BOTTLES DRAWN AEROBIC AND ANAEROBIC Blood Culture results may not be optimal due to an inadequate volume of blood received in culture bottles   Culture   Final    NO GROWTH 1 DAY Performed at [facility], [address] [place], [place] [address]    Report Status PENDING  Incomplete  Culture, blood (routine x 2)     Status: Abnormal   Collection Time: 03/06/19  2:50 AM   Specimen: BLOOD LEFT WRIST  Result Value Ref Range Status   Specimen Description BLOOD LEFT WRIST  Final   Special Requests   Final    BOTTLES DRAWN AEROBIC AND ANAEROBIC Blood Culture adequate volume   Culture  Setup Time   Final    AEROBIC BOTTLE ONLY GRAM POSITIVE COCCI CRITICAL RESULT CALLED TO, READ BACK BY AND VERIFIED WITH: G ABBOTT PHARMD 03/06/19 2322 JDW    Culture (A)  Final    STAPHYLOCOCCUS SPECIES (COAGULASE NEGATIVE) THE SIGNIFICANCE OF ISOLATING THIS ORGANISM FROM A SINGLE SET OF BLOOD CULTURES WHEN MULTIPLE SETS ARE DRAWN IS UNCERTAIN. PLEASE NOTIFY THE MICROBIOLOGY DEPARTMENT WITHIN ONE WEEK IF SPECIATION AND SENSITIVITIES ARE REQUIRED. Performed at [facility], [address] [place], [place] [address]    Report Status 03/07/2019 FINAL  Final     Studies: ECHOCARDIOGRAM COMPLETE  Result Date: 03/06/2019   ECHOCARDIOGRAM REPORT   Patient Name:   Corey Mcdonald Date of Exam: 03/06/2019 Medical Rec #:  [id number]   Height:       69.0 in Accession #:    [id number]  Weight:       195.0 lb Date of Birth:  1923-10-26  BSA:          2.04 m Patient Age:    86 years    BP:           136/74 mmHg Patient Gender: M           HR:           87 bpm. Exam Location:  Inpatient Procedure: 2D Echo, Cardiac Doppler and Color Doppler Indications:    Stroke 434.94/I163.9  History:        Patient has prior history of Echocardiogram examinations, most                 recent 07/31/2017. CAD and Previous Myocardial Infarction,  PAD,                  Arrythmias:Atrial Fibrillation; Risk Factors:Hypertension,                 Dyslipidemia and Sleep Apnea. Abdominal Aortic Aneurysm.  Sonographer:    Jonelle Sidle Dance Referring Phys: [place]  1. Left ventricular ejection fraction, by visual estimation, is 55 to 60%. The left ventricle has normal function. There is mildly increased left ventricular hypertrophy.  2. Left ventricular diastolic parameters are indeterminate.  3. The left ventricle demonstrates regional wall motion abnormalities. Basal inferior and basal inferolateral hypokinesis.  4. Global right ventricle has normal systolic function.The right ventricular size is normal. No increase in right ventricular wall thickness.  5. Left atrial size was moderately dilated.  6. Right atrial size was mildly dilated.  7. Moderate calcification of the mitral valve leaflet(s).  8. Moderate mitral annular calcification.  9. The mitral valve is degenerative. Trivial mitral valve regurgitation. No evidence of mitral stenosis. 10. The tricuspid valve is normal in structure. 11. The aortic valve is bicuspid with fused left and noncoronary cusps. Aortic valve regurgitation is trivial. Probably moderate aortic stenosis. Stenosis is visually moderate and calculated AVA is 1.1 cm^2 though mean gradient is only 13 mmHg. 12. The inferior vena cava is dilated in size with <50% respiratory variability, suggesting right atrial pressure of 15 mmHg. 13. The tricuspid regurgitant velocity is 2.29 m/s, and with an assumed right atrial pressure of 15 mmHg, the estimated right ventricular systolic pressure is mildly elevated at 36.0 mmHg. FINDINGS  Left Ventricle: Left ventricular ejection fraction, by visual estimation, is 55 to 60%. The left ventricle has normal function. The left ventricle demonstrates regional wall motion abnormalities. The left ventricular internal cavity size was the left ventricle is normal in size. There is mildly increased left  ventricular hypertrophy. The left ventricular diastology could not be evaluated due to atrial fibrillation. Left ventricular diastolic parameters are indeterminate. Right Ventricle: The right ventricular size is normal. No increase in right ventricular wall thickness. Global RV systolic function is has normal systolic function. The tricuspid regurgitant velocity is 2.29 m/s, and with an assumed right atrial pressure  of 15 mmHg, the estimated right ventricular systolic pressure is mildly elevated at 36.0 mmHg. Left Atrium: Left atrial size was moderately dilated. Right Atrium: Right atrial size was mildly dilated Pericardium: There is no evidence of pericardial effusion. Mitral Valve: The mitral valve is degenerative in appearance. There is moderate calcification of the mitral valve leaflet(s). Moderate mitral annular calcification. Trivial mitral valve regurgitation. No evidence of mitral valve stenosis by observation. Tricuspid Valve: The tricuspid valve is normal in structure. Tricuspid valve regurgitation is mild. Aortic Valve: The aortic valve is bicuspid. Aortic valve regurgitation is trivial. Moderate aortic stenosis is present. Aortic valve mean gradient measures 13.0 mmHg. Aortic valve peak gradient measures 20.5 mmHg. Aortic valve area, by VTI measures 1.19 cm. Pulmonic Valve: The pulmonic valve was normal in structure. Pulmonic valve regurgitation is not visualized. Pulmonic regurgitation is not visualized. Aorta: The aortic root is normal in size and structure. Venous: The inferior vena cava is dilated in size with less than 50% respiratory variability, suggesting right atrial pressure of 15 mmHg. IAS/Shunts: No atrial level shunt detected by color flow Doppler.  LEFT VENTRICLE PLAX 2D LVIDd:         4.92 cm LVIDs:         3.94 cm LV PW:  1.02 cm LV IVS:        1.12 cm LVOT diam:     2.20 cm LV SV:         46 ml LV SV Index:   22.12 LVOT Area:     3.80 cm  RIGHT VENTRICLE          IVC RV Basal  diam:  3.57 cm  IVC diam: 2.77 cm RV Mid diam:    2.30 cm TAPSE (M-mode): 1.9 cm LEFT ATRIUM              Index       RIGHT ATRIUM           Index LA diam:        4.70 cm  2.30 cm/m  RA Area:     23.90 cm LA Vol (A2C):   113.0 ml 55.29 ml/m RA Volume:   75.70 ml  37.04 ml/m LA Vol (A4C):   68.4 ml  33.47 ml/m LA Biplane Vol: 88.0 ml  43.06 ml/m  AORTIC VALVE AV Area (Vmax):    1.16 cm AV Area (Vmean):   1.15 cm AV Area (VTI):     1.19 cm AV Vmax:           226.33 cm/s AV Vmean:          159.500 cm/s AV VTI:            0.493 m AV Peak Grad:      20.5 mmHg AV Mean Grad:      13.0 mmHg LVOT Vmax:         69.05 cm/s LVOT Vmean:        48.300 cm/s LVOT VTI:          0.154 m LVOT/AV VTI ratio: 0.31  AORTA Ao Root diam: 3.60 cm Ao Asc diam:  3.10 cm MITRAL VALVE                        TRICUSPID VALVE MV Area (PHT): 3.85 cm             TR Peak grad:   21.0 mmHg MV PHT:        57.13 msec           TR Vmax:        229.00 cm/s MV Decel Time: 197 msec MV E velocity: 118.00 cm/s 103 cm/s SHUNTS                                     Systemic VTI:  0.15 m                                     Systemic Diam: 2.20 cm  Marca Ancona MD Electronically signed by Marca Ancona MD Signature Date/Time: 03/06/2019/1:53:11 PM    Final     Scheduled Meds: .  stroke: mapping our early stages of recovery book   Does not apply Once  . aspirin EC  325 mg Oral Daily   Or  . aspirin  300 mg Rectal Daily  . atorvastatin  40 mg Oral q1800  . Chlorhexidine Gluconate Cloth  6 each Topical Daily  . docusate sodium  100 mg Oral QAC supper  . enoxaparin (LOVENOX) injection  40 mg Subcutaneous Q24H  . pantoprazole  40  mg Oral Daily  . tamsulosin  0.4 mg Oral QPM    Continuous Infusions: . sodium chloride 50 mL/hr at 03/08/19 0155    Joycelyn DasLaxman Micky Overturf, MD  [facility] 03/08/2019

## 2019-03-08 NOTE — Discharge Summary (Signed)
Physician Discharge Summary  Corey Mcdonald ZOX:[id number] DOB: 09-09-1923 DOA: 03/05/2019  PCP: Shirline Frees, NP  Admit date: 03/05/2019 Discharge date: 03/08/2019  Admitted From: Home  Discharge disposition:  Hospice   Recommendations for Outpatient Follow-Up:    Follow up with your primary care provider at hospice. Management as per hospice.  Discharge Diagnosis:   Active Problems:   Acute CVA (cerebrovascular accident) (HCC)   Expressive aphasia   Encounter for hospice care discussion   Palliative care encounter  Discharge Condition: stable  Diet recommendation: as per hospice  Wound care: None.  Code status: DNR   History of Present Illness:   Patient is a 86 year old Caucasian male with past medical history significant for coronary artery disease, atrial fibrillation on Eliquis 2.5 Mg p.o. twice daily, abdominal aortic aneurysm, chronic kidney disease stage IIIb, peptic ulcer disease, PVD, MI, restless leg syndrome, sleep apnea, hypertension and hyperlipidemia. Patient is a resident at an assisted living facility. Patient's carerobserved the patient was no longer able to speak earlier today (around 5 PM). Patient was also noted to be awake. EMR was activated. On presentation to the hospital, CT head did not reveal any acute findings. MRI brain, as per neurology note, a CT revealed moderate size left frontal acute stroke. ED Course:On presentation to the emergency room, temperature is 97.4, blood pressure 140/77, heart rate of 83, respiratory rate of 20 with O2 sat of 100% on room air. CBC reveals WBC of 11.6, hemoglobin of 11.4, hematocrit of 36.4, MCV of 96 with platelet counts of 199. Chemistry reveals sodium of 137, potassium of 4.2, chloride 102, CO2 of 26, BUN of 35, creatinine of 1.98, with a blood sugar of 113.Patient was admitted for further assessment and management.   Hospital Course:  Following conditions were addressed during hospitalization as  listed below,  Acute CVA likely cardioembolic from history of atrial fibrillation. MRI of the brain showed moderate sized left MCA territory infarct with possible M2 M3 branch occlusion. Patient with  dementia at baseline. He was on  anticoagulation with eliquis at baseline but despite that he sustained ischemic stroke. Could continue aspirin, Apixaban on hold.   Patient was outside the TPA window on arrival.  Due to poor baseline modified Rankin score, he was not a candidate for intervention.   2D echocardiogram with EF of 55-60%. Palliative care consulted for goals of care and at this time goals of care has been ascertained to residential hospice level of care.  Urinary retention. Status post foley catheter. Can continue for comfort.  Coagulase negative staph in 1 bottle.  Likely contaminant.  Dementia Continue to provide supportive care.  Essential hypertension: not on antihypertensives  Hyperlipidemia: focusing on hospice level of care.  History of chronic atrial fibrillation.   Apixaban on hold.  Continue aspirin via suppository.   CKD 3B: on presentation  History of CAD/MI: No acute issues at this time.  Disposition.  At this time, patient is stable for disposition to residential hospice. Further management as per hospice.  Medical Consultants:    Neurology  Palliative care    Procedures:     None  Subjective:   Today, patient's condition has not changed. He appears to be agitated at times. Nonverbal. No improvement in his mentation.  Discharge Exam:   Vitals:   03/08/19 0917 03/08/19 1221  BP: (!) 163/84 (!) 145/81  Pulse: (!) 118 79  Resp: 20 17  Temp: 99.3 F (37.4 C) 98.9 F (37.2 C)  SpO2: 94% 95%  Vitals:   03/08/19 0043 03/08/19 0308 03/08/19 0917 03/08/19 1221  BP: (!) 162/87 (!) 155/94 (!) 163/84 (!) 145/81  Pulse: (!) 103 98 (!) 118 79  Resp: 19 20 20 17   Temp: 98.6 F (37 C) 98.5 F (36.9 C) 99.3 F (37.4 C) 98.9 F (37.2 C)    TempSrc: Oral Oral Oral Oral  SpO2: 98% 96% 94% 95%  Weight:      Height:       GENERAL: Patient is mildly agitated, right eye is congested with discharge. Appears to be mildly agitated. HENT: Oral mucosa is mildly dry. NECK: is supple, no palpable thyroid enlargement. CHEST: No obvious wheezes noted. Diminished breath sounds bilaterally. CVS: S1 and S2 heard, no murmur.  Mildly tachycardic.  ABDOMEN: Soft, non-tender, bowel sounds are present.  Condom cath in place EXTREMITIES: No edema. CNS: Patient is agitated, appears uncomfortable.  Nonverbal. SKIN: warm and dry without rashes.   The results of significant diagnostics from this hospitalization (including imaging, microbiology, ancillary and laboratory) are listed below for reference.     Diagnostic Studies:   DG Pelvis 1-2 Views  Result Date: 03/05/2019 CLINICAL DATA:  Fall, no history of prior injuries or surgery EXAM: PELVIS - 1-2 VIEW COMPARISON:  Angiography abdomen and pelvis 08/06/2013 FINDINGS: The osseous structures appear diffusely demineralized which may limit detection of small or nondisplaced fractures. Bones of the pelvis appear intact and congruent. No abnormal diastasis of the SI joints or symphysis pubis. Femoral heads are normally located. No proximal femoral fractures are seen. Extensive vascular calcium is noted with aortoiliac stent reconstruction. Remaining soft tissues are unremarkable. IMPRESSION: 1. Diffuse osseous demineralization which may limit detection of small or nondisplaced fractures. 2. No osseous abnormality visualized. 3. Extensive atherosclerosis.  Prior aortoiliac repair. Electronically Signed   By: Kreg Shropshire M.D.   On: 03/05/2019 22:07   CT HEAD WO CONTRAST  Result Date: 03/05/2019 CLINICAL DATA:  86 year old male with dementia and altered mental status. Head trauma. EXAM: CT HEAD WITHOUT CONTRAST CT CERVICAL SPINE WITHOUT CONTRAST TECHNIQUE: Multidetector CT imaging of the head and cervical  spine was performed following the standard protocol without intravenous contrast. Multiplanar CT image reconstructions of the cervical spine were also generated. COMPARISON:  Head CT dated 01/09/2016. FINDINGS: CT HEAD FINDINGS Brain: There is mild age-related atrophy and chronic microvascular ischemic changes. There is no acute intracranial hemorrhage. No mass effect midline shift. No extra-axial fluid collection. Vascular: No hyperdense vessel or unexpected calcification. Skull: Normal. Negative for fracture or focal lesion. Sinuses/Orbits: Mild mucoperiosteal thickening of paranasal sinuses. No air-fluid level. The mastoid air cells are clear. Other: None CT CERVICAL SPINE FINDINGS Alignment: No acute subluxation. Skull base and vertebrae: No acute fracture. Osteopenia. There is lead bony fusion of the posterior ring of C1. Soft tissues and spinal canal: No prevertebral fluid or swelling. No visible canal hematoma. Disc levels: Multilevel degenerative changes with endplate irregularity and disc space narrowing. Upper chest: Emphysema. Other: Bilateral carotid bulb calcified plaques. IMPRESSION: 1. No acute intracranial hemorrhage. Age-related atrophy and chronic microvascular ischemic changes. 2. No acute/traumatic cervical spine pathology. Degenerative changes. Electronically Signed   By: Elgie Collard M.D.   On: 03/05/2019 22:11   CT Cervical Spine Wo Contrast  Result Date: 03/05/2019 CLINICAL DATA:  86 year old male with dementia and altered mental status. Head trauma. EXAM: CT HEAD WITHOUT CONTRAST CT CERVICAL SPINE WITHOUT CONTRAST TECHNIQUE: Multidetector CT imaging of the head and cervical spine was performed following the standard protocol without intravenous  contrast. Multiplanar CT image reconstructions of the cervical spine were also generated. COMPARISON:  Head CT dated 01/09/2016. FINDINGS: CT HEAD FINDINGS Brain: There is mild age-related atrophy and chronic microvascular ischemic changes.  There is no acute intracranial hemorrhage. No mass effect midline shift. No extra-axial fluid collection. Vascular: No hyperdense vessel or unexpected calcification. Skull: Normal. Negative for fracture or focal lesion. Sinuses/Orbits: Mild mucoperiosteal thickening of paranasal sinuses. No air-fluid level. The mastoid air cells are clear. Other: None CT CERVICAL SPINE FINDINGS Alignment: No acute subluxation. Skull base and vertebrae: No acute fracture. Osteopenia. There is lead bony fusion of the posterior ring of C1. Soft tissues and spinal canal: No prevertebral fluid or swelling. No visible canal hematoma. Disc levels: Multilevel degenerative changes with endplate irregularity and disc space narrowing. Upper chest: Emphysema. Other: Bilateral carotid bulb calcified plaques. IMPRESSION: 1. No acute intracranial hemorrhage. Age-related atrophy and chronic microvascular ischemic changes. 2. No acute/traumatic cervical spine pathology. Degenerative changes. Electronically Signed   By: Anner Crete M.D.   On: 03/05/2019 22:11   MR ANGIO HEAD WO CONTRAST  Result Date: 03/06/2019 CLINICAL DATA:  Initial evaluation for transient ischemic attack. EXAM: MRI HEAD WITHOUT CONTRAST MRA HEAD WITHOUT CONTRAST TECHNIQUE: Multiplanar, multiecho pulse sequences of the brain and surrounding structures were obtained without intravenous contrast. Angiographic images of the head were obtained using MRA technique without contrast. COMPARISON:  Prior CT from 03/05/2019. FINDINGS: MRI HEAD FINDINGS Brain: Examination degraded by motion artifact. Generalized age-related cerebral atrophy with mild chronic small vessel ischemic disease. Confluent area of restricted diffusion involving the anterior left frontal lobe, with involvement of the left frontal operculum and underlying insula, compatible with acute MCA territory infarct (series 5, image 79). No associated hemorrhage or mass effect. No other evidence for acute or subacute  ischemia. Gray-white matter differentiation otherwise maintained. No other areas of discernible remote cortical infarction. No foci of susceptibility artifact to suggest acute or chronic intracranial hemorrhage. No mass lesion, midline shift or mass effect. No hydrocephalus. No extra-axial fluid collection. Pituitary gland suprasellar region normal. Midline structures intact. Vascular: Major intracranial vascular flow voids are grossly maintained at the skull base. Skull and upper cervical spine: Craniocervical junction within normal limits. Bone marrow signal intensity normal. No scalp soft tissue abnormality. Sinuses/Orbits: Patient status post ocular lens replacement on the left. Left gaze noted. Chronic mucoperiosteal thickening noted within the ethmoidal air cells. Paranasal sinuses are otherwise clear. No mastoid effusion. Inner ear structures grossly normal. Other: None. MRA HEAD FINDINGS ANTERIOR CIRCULATION: Examination degraded by motion artifact. Distal cervical segments of the internal carotid arteries are patent with symmetric antegrade flow. Petrous segments patent bilaterally. Probable atheromatous irregularity throughout the carotid siphons without definite high-grade stenosis. A1 segments patent. Grossly normal anterior communicating artery complex. Anterior cerebral arteries patent to their distal aspects without stenosis. Right M1 widely patent. Grossly negative right MCA bifurcation. Distal right MCA branches perfused. Left M1 appears to bifurcate early and is grossly patent without appreciable stenosis. Grossly negative left MCA bifurcation. There is question of a left M2 and/or M3 branch occlusion (series 16, image 86). Overall, left MCA branches are attenuated as compared to the right. POSTERIOR CIRCULATION: Both vertebral arteries patent to the vertebrobasilar junction without stenosis. Neither PICA of visualized. Basilar tortuous but patent to its distal aspect without appreciable stenosis.  Superior cerebral arteries grossly patent proximally. PCAs appear to be largely supplied via the basilar, and are grossly patent to their mid-distal P2 segments. Distal PCAs not well assessed due  to motion. IMPRESSION: MRI HEAD IMPRESSION: 1. Motion degraded exam. 2. Moderate sized acute ischemic left MCA territory infarct involving the left insula and overlying left frontal operculum. No associated hemorrhage or significant mass effect. 3. Underlying age-related cerebral atrophy with mild chronic small vessel ischemic disease. MRA HEAD IMPRESSION: 1. Technically limited exam due to extensive motion artifact. 2. Probable left M2/M3 branch occlusion, corresponding with the acute left MCA territory infarct. 3. Otherwise grossly negative intracranial MRA. No other large vessel occlusion. No hemodynamically significant or correctable stenosis. Electronically Signed   By: Rise Mu M.D.   On: 03/06/2019 02:21   MR BRAIN WO CONTRAST  Result Date: 03/06/2019 CLINICAL DATA:  Initial evaluation for transient ischemic attack. EXAM: MRI HEAD WITHOUT CONTRAST MRA HEAD WITHOUT CONTRAST TECHNIQUE: Multiplanar, multiecho pulse sequences of the brain and surrounding structures were obtained without intravenous contrast. Angiographic images of the head were obtained using MRA technique without contrast. COMPARISON:  Prior CT from 03/05/2019. FINDINGS: MRI HEAD FINDINGS Brain: Examination degraded by motion artifact. Generalized age-related cerebral atrophy with mild chronic small vessel ischemic disease. Confluent area of restricted diffusion involving the anterior left frontal lobe, with involvement of the left frontal operculum and underlying insula, compatible with acute MCA territory infarct (series 5, image 79). No associated hemorrhage or mass effect. No other evidence for acute or subacute ischemia. Gray-white matter differentiation otherwise maintained. No other areas of discernible remote cortical infarction.  No foci of susceptibility artifact to suggest acute or chronic intracranial hemorrhage. No mass lesion, midline shift or mass effect. No hydrocephalus. No extra-axial fluid collection. Pituitary gland suprasellar region normal. Midline structures intact. Vascular: Major intracranial vascular flow voids are grossly maintained at the skull base. Skull and upper cervical spine: Craniocervical junction within normal limits. Bone marrow signal intensity normal. No scalp soft tissue abnormality. Sinuses/Orbits: Patient status post ocular lens replacement on the left. Left gaze noted. Chronic mucoperiosteal thickening noted within the ethmoidal air cells. Paranasal sinuses are otherwise clear. No mastoid effusion. Inner ear structures grossly normal. Other: None. MRA HEAD FINDINGS ANTERIOR CIRCULATION: Examination degraded by motion artifact. Distal cervical segments of the internal carotid arteries are patent with symmetric antegrade flow. Petrous segments patent bilaterally. Probable atheromatous irregularity throughout the carotid siphons without definite high-grade stenosis. A1 segments patent. Grossly normal anterior communicating artery complex. Anterior cerebral arteries patent to their distal aspects without stenosis. Right M1 widely patent. Grossly negative right MCA bifurcation. Distal right MCA branches perfused. Left M1 appears to bifurcate early and is grossly patent without appreciable stenosis. Grossly negative left MCA bifurcation. There is question of a left M2 and/or M3 branch occlusion (series 16, image 86). Overall, left MCA branches are attenuated as compared to the right. POSTERIOR CIRCULATION: Both vertebral arteries patent to the vertebrobasilar junction without stenosis. Neither PICA of visualized. Basilar tortuous but patent to its distal aspect without appreciable stenosis. Superior cerebral arteries grossly patent proximally. PCAs appear to be largely supplied via the basilar, and are grossly  patent to their mid-distal P2 segments. Distal PCAs not well assessed due to motion. IMPRESSION: MRI HEAD IMPRESSION: 1. Motion degraded exam. 2. Moderate sized acute ischemic left MCA territory infarct involving the left insula and overlying left frontal operculum. No associated hemorrhage or significant mass effect. 3. Underlying age-related cerebral atrophy with mild chronic small vessel ischemic disease. MRA HEAD IMPRESSION: 1. Technically limited exam due to extensive motion artifact. 2. Probable left M2/M3 branch occlusion, corresponding with the acute left MCA territory infarct. 3. Otherwise grossly  negative intracranial MRA. No other large vessel occlusion. No hemodynamically significant or correctable stenosis. Electronically Signed   By: Rise Mu M.D.   On: 03/06/2019 02:21   DG Chest Port 1 View  Result Date: 03/05/2019 CLINICAL DATA:  86 year old male with altered mental status. EXAM: PORTABLE CHEST 1 VIEW COMPARISON:  Chest CT dated 05/10/2011. FINDINGS: There is mild cardiomegaly with vascular congestion. No focal consolidation, pleural effusion, or pneumothorax. Median sternotomy wires and CABG vascular clips. Atherosclerotic calcification of the aorta. No acute osseous pathology. IMPRESSION: Cardiomegaly with mild vascular congestion.  No focal consolidation. Electronically Signed   By: Elgie Collard M.D.   On: 03/05/2019 21:19   DG Abd Portable 1 View  Result Date: 03/05/2019 CLINICAL DATA:  MRI clearance EXAM: PORTABLE ABDOMEN - 1 VIEW COMPARISON:  December 29, 2010 FINDINGS: Aorto bi-iliac stent graft is seen. Surgical clips in the left upper quadrant. The bowel gas pattern is normal. No radio-opaque calculi or other significant radiographic abnormality are seen. IMPRESSION: Nonobstructive bowel gas pattern. Aorto bi-iliac stent graft and surgical clips in the left upper quadrant. Electronically Signed   By: Jonna Clark M.D.   On: 03/05/2019 23:18   ECHOCARDIOGRAM  COMPLETE  Result Date: 03/06/2019   ECHOCARDIOGRAM REPORT   Patient Name:   Corey Mcdonald Date of Exam: 03/06/2019 Medical Rec #:  [id number]   Height:       69.0 in Accession #:    [id number]  Weight:       195.0 lb Date of Birth:  May 15, 1923  BSA:          2.04 m Patient Age:    86 years    BP:           136/74 mmHg Patient Gender: M           HR:           87 bpm. Exam Location:  Inpatient Procedure: 2D Echo, Cardiac Doppler and Color Doppler Indications:    Stroke 434.94/I163.9  History:        Patient has prior history of Echocardiogram examinations, most                 recent 07/31/2017. CAD and Previous Myocardial Infarction, PAD,                 Arrythmias:Atrial Fibrillation; Risk Factors:Hypertension,                 Dyslipidemia and Sleep Apnea. Abdominal Aortic Aneurysm.  Sonographer:    Elmarie Shiley Dance Referring Phys: [id number] SYLVESTER I OGBATA IMPRESSIONS  1. Left ventricular ejection fraction, by visual estimation, is 55 to 60%. The left ventricle has normal function. There is mildly increased left ventricular hypertrophy.  2. Left ventricular diastolic parameters are indeterminate.  3. The left ventricle demonstrates regional wall motion abnormalities. Basal inferior and basal inferolateral hypokinesis.  4. Global right ventricle has normal systolic function.The right ventricular size is normal. No increase in right ventricular wall thickness.  5. Left atrial size was moderately dilated.  6. Right atrial size was mildly dilated.  7. Moderate calcification of the mitral valve leaflet(s).  8. Moderate mitral annular calcification.  9. The mitral valve is degenerative. Trivial mitral valve regurgitation. No evidence of mitral stenosis. 10. The tricuspid valve is normal in structure. 11. The aortic valve is bicuspid with fused left and noncoronary cusps. Aortic valve regurgitation is trivial. Probably moderate aortic stenosis. Stenosis is visually moderate and calculated AVA is 1.1 cm^2 though  mean gradient is  only 13 mmHg. 12. The inferior vena cava is dilated in size with <50% respiratory variability, suggesting right atrial pressure of 15 mmHg. 13. The tricuspid regurgitant velocity is 2.29 m/s, and with an assumed right atrial pressure of 15 mmHg, the estimated right ventricular systolic pressure is mildly elevated at 36.0 mmHg. FINDINGS  Left Ventricle: Left ventricular ejection fraction, by visual estimation, is 55 to 60%. The left ventricle has normal function. The left ventricle demonstrates regional wall motion abnormalities. The left ventricular internal cavity size was the left ventricle is normal in size. There is mildly increased left ventricular hypertrophy. The left ventricular diastology could not be evaluated due to atrial fibrillation. Left ventricular diastolic parameters are indeterminate. Right Ventricle: The right ventricular size is normal. No increase in right ventricular wall thickness. Global RV systolic function is has normal systolic function. The tricuspid regurgitant velocity is 2.29 m/s, and with an assumed right atrial pressure  of 15 mmHg, the estimated right ventricular systolic pressure is mildly elevated at 36.0 mmHg. Left Atrium: Left atrial size was moderately dilated. Right Atrium: Right atrial size was mildly dilated Pericardium: There is no evidence of pericardial effusion. Mitral Valve: The mitral valve is degenerative in appearance. There is moderate calcification of the mitral valve leaflet(s). Moderate mitral annular calcification. Trivial mitral valve regurgitation. No evidence of mitral valve stenosis by observation. Tricuspid Valve: The tricuspid valve is normal in structure. Tricuspid valve regurgitation is mild. Aortic Valve: The aortic valve is bicuspid. Aortic valve regurgitation is trivial. Moderate aortic stenosis is present. Aortic valve mean gradient measures 13.0 mmHg. Aortic valve peak gradient measures 20.5 mmHg. Aortic valve area, by VTI measures 1.19 cm.  Pulmonic Valve: The pulmonic valve was normal in structure. Pulmonic valve regurgitation is not visualized. Pulmonic regurgitation is not visualized. Aorta: The aortic root is normal in size and structure. Venous: The inferior vena cava is dilated in size with less than 50% respiratory variability, suggesting right atrial pressure of 15 mmHg. IAS/Shunts: No atrial level shunt detected by color flow Doppler.  LEFT VENTRICLE PLAX 2D LVIDd:         4.92 cm LVIDs:         3.94 cm LV PW:         1.02 cm LV IVS:        1.12 cm LVOT diam:     2.20 cm LV SV:         46 ml LV SV Index:   22.12 LVOT Area:     3.80 cm  RIGHT VENTRICLE          IVC RV Basal diam:  3.57 cm  IVC diam: 2.77 cm RV Mid diam:    2.30 cm TAPSE (M-mode): 1.9 cm LEFT ATRIUM              Index       RIGHT ATRIUM           Index LA diam:        4.70 cm  2.30 cm/m  RA Area:     23.90 cm LA Vol (A2C):   113.0 ml 55.29 ml/m RA Volume:   75.70 ml  37.04 ml/m LA Vol (A4C):   68.4 ml  33.47 ml/m LA Biplane Vol: 88.0 ml  43.06 ml/m  AORTIC VALVE AV Area (Vmax):    1.16 cm AV Area (Vmean):   1.15 cm AV Area (VTI):     1.19 cm AV Vmax:  226.33 cm/s AV Vmean:          159.500 cm/s AV VTI:            0.493 m AV Peak Grad:      20.5 mmHg AV Mean Grad:      13.0 mmHg LVOT Vmax:         69.05 cm/s LVOT Vmean:        48.300 cm/s LVOT VTI:          0.154 m LVOT/AV VTI ratio: 0.31  AORTA Ao Root diam: 3.60 cm Ao Asc diam:  3.10 cm MITRAL VALVE                        TRICUSPID VALVE MV Area (PHT): 3.85 cm             TR Peak grad:   21.0 mmHg MV PHT:        57.13 msec           TR Vmax:        229.00 cm/s MV Decel Time: 197 msec MV E velocity: 118.00 cm/s 103 cm/s SHUNTS                                     Systemic VTI:  0.15 m                                     Systemic Diam: 2.20 cm  Marca Ancona MD Electronically signed by Marca Ancona MD Signature Date/Time: 03/06/2019/1:53:11 PM    Final      Labs:   Basic Metabolic Panel: Recent Labs   Lab 03/05/19 2024 03/05/19 2024 03/05/19 2030 03/05/19 2030 03/06/19 0108 03/07/19 0348  NA 137  --  138  --  140 143  K 4.2   < > 4.2   < > 3.6 3.7  CL 102  --  103  --   --  110  CO2 26  --   --   --   --  25  GLUCOSE 120*  --  113*  --   --  116*  BUN 35*  --  35*  --   --  27*  CREATININE 1.98*  --  1.90*  --   --  1.61*  CALCIUM 8.8*  --   --   --   --  8.8*  MG  --   --   --   --   --  2.1   < > = values in this interval not displayed.   GFR Estimated Creatinine Clearance: 31.5 mL/min (A) (by C-G formula based on SCr of 1.61 mg/dL (H)). Liver Function Tests: Recent Labs  Lab 03/05/19 2024  AST 22  ALT 23  ALKPHOS 53  BILITOT 0.6  PROT 7.1  ALBUMIN 3.3*   No results for input(s): LIPASE, AMYLASE in the last 168 hours. No results for input(s): AMMONIA in the last 168 hours. Coagulation profile Recent Labs  Lab 03/05/19 2024  INR 1.1    CBC: Recent Labs  Lab 03/05/19 2024 03/05/19 2030 03/06/19 0108 03/07/19 0348  WBC 11.6*  --   --  11.0*  NEUTROABS 9.5*  --   --   --   HGB 11.4* 11.2* 10.9* 11.2*  HCT 36.4* 33.0* 32.0* 34.3*  MCV 96.0  --   --  93.7  PLT 199  --   --  260   Cardiac Enzymes: Recent Labs  Lab 03/05/19 2024  CKTOTAL 90   BNP: Invalid input(s): POCBNP CBG: Recent Labs  Lab 03/07/19 1818 03/08/19 0043 03/08/19 0051 03/08/19 0557 03/08/19 1217  GLUCAP 89 111* 105* 113* 125*   D-Dimer No results for input(s): DDIMER in the last 72 hours. Hgb A1c Recent Labs    03/06/19 0500  HGBA1C 6.2*   Lipid Profile Recent Labs    03/06/19 0500  CHOL 141  HDL 37*  LDLCALC 83  TRIG 712  CHOLHDL 3.8   Thyroid function studies No results for input(s): TSH, T4TOTAL, T3FREE, THYROIDAB in the last 72 hours.  Invalid input(s): FREET3 Anemia work up No results for input(s): VITAMINB12, FOLATE, FERRITIN, TIBC, IRON, RETICCTPCT in the last 72 hours. Microbiology Recent Results (from the past 240 hour(s))  Urine culture      Status: None   Collection Time: 03/05/19  9:00 PM   Specimen: Urine, Catheterized  Result Value Ref Range Status   Specimen Description URINE, CATHETERIZED  Final   Special Requests NONE  Final   Culture   Final    NO GROWTH Performed at [facility], [address] [place], [place] [address]    Report Status 03/06/2019 FINAL  Final  SARS CORONAVIRUS 2 (TAT 6-24 HRS) Nasopharyngeal Nasopharyngeal Swab     Status: None   Collection Time: 03/05/19 10:31 PM   Specimen: Nasopharyngeal Swab  Result Value Ref Range Status   SARS Coronavirus 2 NEGATIVE NEGATIVE Final    Comment: (NOTE) SARS-CoV-2 target nucleic acids are NOT DETECTED. The SARS-CoV-2 RNA is generally detectable in upper and lower respiratory specimens during the acute phase of infection. Negative results do not preclude SARS-CoV-2 infection, do not rule out co-infections with other pathogens, and should not be used as the sole basis for treatment or other patient management decisions. Negative results must be combined with clinical observations, patient history, and epidemiological information. The expected result is Negative. Fact Sheet for Patients: HairSlick.no Fact Sheet for Healthcare Providers: quierodirigir.com This test is not yet approved or cleared by the [place] FDA and  has been authorized for detection and/or diagnosis of SARS-CoV-2 by FDA under an Emergency Use Authorization (EUA). This EUA will remain  in effect (meaning this test can be used) for the duration of the COVID-19 declaration under Section 56 4(b)(1) of the Act, 21 U.S.C. section 360bbb-3(b)(1), unless the authorization is terminated or revoked sooner. Performed at [facility], [address], [place], [place] [address]   Culture, blood (routine x 2)     Status: None (Preliminary result)   Collection Time: 03/06/19  2:39 AM   Specimen: BLOOD  Result Value Ref Range  Status   Specimen Description BLOOD RIGHT ANTECUBITAL  Final   Special Requests   Final    BOTTLES DRAWN AEROBIC AND ANAEROBIC Blood Culture results may not be optimal due to an inadequate volume of blood received in culture bottles   Culture   Final    NO GROWTH 1 DAY Performed at [facility], [address], [place], [place] [address]    Report Status PENDING  Incomplete  Culture, blood (routine x 2)     Status: Abnormal   Collection Time: 03/06/19  2:50 AM   Specimen: BLOOD LEFT WRIST  Result Value Ref Range Status   Specimen Description BLOOD LEFT  WRIST  Final   Special Requests   Final    BOTTLES DRAWN AEROBIC AND ANAEROBIC Blood Culture adequate volume   Culture  Setup Time   Final    AEROBIC BOTTLE ONLY GRAM POSITIVE COCCI CRITICAL RESULT CALLED TO, READ BACK BY AND VERIFIED WITH: G ABBOTT PHARMD 03/06/19 2322 JDW    Culture (A)  Final    STAPHYLOCOCCUS SPECIES (COAGULASE NEGATIVE) THE SIGNIFICANCE OF ISOLATING THIS ORGANISM FROM A SINGLE SET OF BLOOD CULTURES WHEN MULTIPLE SETS ARE DRAWN IS UNCERTAIN. PLEASE NOTIFY THE MICROBIOLOGY DEPARTMENT WITHIN ONE WEEK IF SPECIATION AND SENSITIVITIES ARE REQUIRED. Performed at [facility], [address] [place], [place] [address]    Report Status 03/07/2019 FINAL  Final     Discharge Instructions:   Discharge Instructions    Bed rest   Complete by: As directed    Discharge instructions   Complete by: As directed    Management as per hospice     Allergies as of 03/08/2019      Reactions   Cilostazol Other (See Comments)   Adverse reaction per patient - leg cramps   Doxazosin Other (See Comments)   Leg cramps      Medication List    STOP taking these medications   apixaban 2.5 MG Tabs tablet Commonly known as: Eliquis   furosemide 20 MG tablet Commonly known as: LASIX   multivitamin with minerals Tabs tablet   nitroGLYCERIN 0.4 MG SL tablet Commonly known as: NITROSTAT   PRESCRIPTION MEDICATION      TAKE these medications   aspirin 325 MG EC tablet Take 1 tablet (325 mg total) by mouth daily. Start taking on: March 09, 2019   atorvastatin 20 MG tablet Commonly known as: LIPITOR Take 1 tablet by mouth once daily What changed: when to take this   docusate sodium 100 MG capsule Commonly known as: COLACE Take 100 mg by mouth daily before supper.   Iron 240 (27 Fe) MG Tabs Take 1 tablet by mouth at bedtime.   metoprolol tartrate 25 MG tablet Commonly known as: LOPRESSOR Take 1 tablet by mouth twice daily   pantoprazole 40 MG tablet Commonly known as: PROTONIX TAKE 1 TABLET BY MOUTH ONCE DAILY   tamsulosin 0.4 MG Caps capsule Commonly known as: FLOMAX Take 1 capsule (0.4 mg total) by mouth every evening.   tobramycin 0.3 % ophthalmic solution Commonly known as: Tobrex Place 2 drops into both eyes every 4 (four) hours. What changed:   when to take this  reasons to take this       Time coordinating discharge: 39 minutes  Signed:  Sumiko Ceasar  [facility] 03/08/2019, 3:45 PM

## 2019-03-08 NOTE — Progress Notes (Signed)
Visited Mr. [facility] per Spiritual Consult request. Prayed with Mr. Hinchman and provided a ministry of presence and comfort. Will continue to be available as needed for spiritual care.  Rev. Margaretann Loveless Chaplain M. Div.

## 2019-03-08 NOTE — TOC Transition Note (Signed)
Transition of Care [facility]) - CM/SW Discharge Note   Patient Details  Name: Gates Jividen MRN: [id number] Date of Birth: [birth year]  Transition of Care [facility]) CM/SW Contact:  Baldemar Lenis, LCSW Phone Number: 03/08/2019, 4:09 PM   Clinical Narrative:   CSW alerted by Palliative Medicine Team NP that patient's family would like to choose hospice home, with preference for hospice house closest to the daughter in [place], [place]. Per Google review, that appears to be [facility]. CSW provided referral to [facility], and they can admit today. Daughter will complete paperwork.  Nurse to call report to [phone].    Final next level of care: Hospice Medical Facility Barriers to Discharge: Barriers Resolved   Patient Goals and CMS Choice        Discharge Placement                Patient to be transferred to facility by: PTAR Name of family member notified: Daughter Patient and family notified of of transfer: 03/08/19  Discharge Plan and Services                                     Social Determinants of Health (SDOH) Interventions     Readmission Risk Interventions No flowsheet data found.

## 2019-03-08 NOTE — Progress Notes (Signed)
STROKE TEAM PROGRESS NOTE   INTERVAL HISTORY Daughter at bedside, patient lying in bed, still has right hemiplegia and global aphasia.  Daughter requested comfort care measures and hospice placement.  Vitals:   03/08/19 0043 03/08/19 0308 03/08/19 0917 03/08/19 1221  BP: (!) 162/87 (!) 155/94 (!) 163/84 (!) 145/81  Pulse: (!) 103 98 (!) 118 79  Resp: 19 20 20 17   Temp: 98.6 F (37 C) 98.5 F (36.9 C) 99.3 F (37.4 C) 98.9 F (37.2 C)  TempSrc: Oral Oral Oral Oral  SpO2: 98% 96% 94% 95%  Weight:      Height:        CBC:  Recent Labs  Lab 03/05/19 2024 03/05/19 2030 03/06/19 0108 03/07/19 0348  WBC 11.6*  --   --  11.0*  NEUTROABS 9.5*  --   --   --   HGB 11.4*   < > 10.9* 11.2*  HCT 36.4*   < > 32.0* 34.3*  MCV 96.0  --   --  93.7  PLT 199  --   --  260   < > = values in this interval not displayed.    Basic Metabolic Panel:  Recent Labs  Lab 03/05/19 2024 03/05/19 2024 03/05/19 2030 03/05/19 2030 03/06/19 0108 03/07/19 0348  NA 137   < > 138   < > 140 143  K 4.2   < > 4.2   < > 3.6 3.7  CL 102   < > 103  --   --  110  CO2 26  --   --   --   --  25  GLUCOSE 120*   < > 113*  --   --  116*  BUN 35*   < > 35*  --   --  27*  CREATININE 1.98*   < > 1.90*  --   --  1.61*  CALCIUM 8.8*  --   --   --   --  8.8*  MG  --   --   --   --   --  2.1   < > = values in this interval not displayed.   Lipid Panel:     Component Value Date/Time   CHOL 141 03/06/2019 0500   TRIG 105 03/06/2019 0500   HDL 37 (L) 03/06/2019 0500   CHOLHDL 3.8 03/06/2019 0500   VLDL 21 03/06/2019 0500   LDLCALC 83 03/06/2019 0500   HgbA1c:  Lab Results  Component Value Date   HGBA1C 6.2 (H) 03/06/2019   Urine Drug Screen:     Component Value Date/Time   LABOPIA NONE DETECTED 03/05/2019 2100   COCAINSCRNUR NONE DETECTED 03/05/2019 2100   LABBENZ NONE DETECTED 03/05/2019 2100   AMPHETMU NONE DETECTED 03/05/2019 2100   THCU NONE DETECTED 03/05/2019 2100   LABBARB NONE DETECTED  03/05/2019 2100    Alcohol Level     Component Value Date/Time   ETH <10 03/05/2019 2024    IMAGING past 48 hours No results found.  PHYSICAL EXAM   Temp:  [97.7 F (36.5 C)-99.3 F (37.4 C)] 98.9 F (37.2 C) (01/15 1221) Pulse Rate:  [79-118] 79 (01/15 1221) Resp:  [17-20] 17 (01/15 1221) BP: (142-163)/(81-94) 145/81 (01/15 1221) SpO2:  [94 %-98 %] 95 % (01/15 1221)  General - Well nourished, well developed, in no apparent distress.  Ophthalmologic - fundi not visualized due to noncooperation. Right eye ectropion, redness of conjunctiva, improving  Cardiovascular - irregularly irregular heart rate and rhythm.  Neuro - awake, eyes open, tracking on the left visual field, global aphasia, not following commands. PERRL, left gaze preference, and able to cross midline today. Inconsistently blinking to visual threat bilaterally. Right facial droop. Tongue protrusion not cooperative. LUE 5/5 and spontaneous against gravities. LLE 3/5 at least and spontaneous movement. RUE 2+/5 bicep on pain stimulation, RLE mild withdraw to pain, 2/5. Right babinski positive. Sensation, coordination and gait not tested.   ASSESSMENT/PLAN Mr. Markeith Jue is a 86 y.o. male with history of atrial fibrillation on Eliquis, AAA, coronary artery disease, hypertension, hyperlipidemia, macular degeneration with blindness in both eyes, peripheral vascular disease, sleep apnea, restless leg syndrome, dementia, at baseline using a walker to walk presenting with speech difficulties.   Stroke: Moderate L MCA territory infarct embolic d/t atrial fibrillation even on Eliquis 2.5  CT head No acute abnormality. Small vessel disease. Atrophy.  MRI  Moderate L MCA territory infarct (L insula, L frontal operculum) Small vessel disease. Atrophy.   MRA  probable L M2/M3 occlusion   2D Echo EF 55-60%  LDL 83  HgbA1c 6.2  SCDs for VTE prophylaxis  Eliquis (apixaban) daily 2.5 bid prior to admission, now on aspirin  300 mg suppository daily.   Disposition:  daughter has requested comfort care measures and hospice placement  Atrial Fibrillation  Home anticoagulation:  Eliquis (apixaban) daily 2.5 bid  On ASA  Hold off AC for now given moderate sized stroke and comfort care measures as requested by daughter   Hypertension  Stable . Permissive hypertension (OK if < 220/120) but gradually normalize in 5-7 days . Long-term BP goal normotensive  Hyperlipidemia  Home meds:  lipitor 20  LDL 83, goal < 70  No statin given comfort care measures and no p.o. access  Dysphagia Secondary to stroke NPO Speech on board Cleared for teaspoons of honey thick liquids on the floor for comfort feeds   Other Stroke Risk Factors  Advanced age  Former Cigarette smoker, quit 32 yrs ago  Hx stroke/TIA  Family hx stroke (father, brother)  Coronary artery disease s/p MI  Obstructive sleep apnea  PAD/PVD  Other Active Problems  Baseline dementia w/ sundowning  CKD stage 3b Cre 1.98->1.9->1.61  Leukocytosis WBC 11.6->11.0  Urinary retention. Foley placed.   Hospital day # 2  Neurology will sign off. Please call with questions. Thanks for the consult.   Marvel Plan, MD PhD Stroke Neurology 03/08/2019 3:31 PM   To contact Stroke Continuity provider, please refer to [email or web address]. After hours, contact General Neurology

## 2019-03-08 NOTE — Progress Notes (Signed)
  Speech Language Pathology Treatment: Dysphagia  Patient Details Name: Corey Mcdonald MRN: [id number] DOB: January 25, 1924 Today's Date: 03/08/2019 Time: 0940-1000 SLP Time Calculation (min) (ACUTE ONLY): 20 min  Assessment / Plan / Recommendation Clinical Impression  Pt demonstrates improved arousal since being seen in the ED. SLP repositioned, completed oral care, removing thick coating of mucous forming on hard palate while open mouth breathing. Pt then able to follow one step commands and recognize spoon for total assist feeding. There continues to be severe oral dysphagia and signs of severe pharyngeal dysphagia with suspected aspiration events throughout. Pt needs total assist for adequate labial seal, intermittent suction to remove pooled PO in oral cavity. There are multiple weak appearing swallow responses for each bolus given and delayed weak coughing. Pt did enjoy a few oz of applesauce and honey thick water via this method, but again aspiration suspected throughout. Daughter has reported a desire for her father to access to PO for comfort and pleasure and would not want a feeding tube for him. Recommend staff offer bites of pudding/puree/honey thick liquids in moderation from floor stock. Await further plans for care. Continue to recommend comfort measures as pt is unlikely to eat or drink enough without significant asrpiation events.   HPI HPI: Patient is a 86 year old Caucasian male with past medical history significant for coronary artery disease, atrial fibrillation on Eliquis 2.5 Mg p.o. twice daily, abdominal aortic aneurysm, chronic kidney disease stage IIIb, peptic ulcer disease, PVD, MI, restless leg syndrome, sleep apnea, hypertension and hyperlipidemia.  Patient is a resident at an assisted living facility. At baseline he is able to talk and engage in a conversation although he has memory lapses and his finances and other important tasks have been managed by the daughter over the last 5  years. Patient's carer observed the patient was no longer able to speak.   MRI brain, as per neurology note, a CT revealed moderate size left frontal acute stroke.  Daughter reports that pt has runny nose, frequent coughing at baseline      SLP Plan  Continue with current plan of care       Recommendations  Diet recommendations: Other(comment)(bites of ice cream/pudding/honey thick from floor) Liquids provided via: Teaspoon Medication Administration: Crushed with puree Supervision: Full supervision/cueing for compensatory strategies Compensations: Slow rate;Small sips/bites;Lingual sweep for clearance of pocketing Postural Changes and/or Swallow Maneuvers: Seated upright 90 degrees                Oral Care Recommendations: Oral care BID Follow up Recommendations: 24 hour supervision/assistance SLP Visit Diagnosis: Dysphagia, oropharyngeal phase (R13.12) Plan: Continue with current plan of care       GO               Harlon Ditty, MA CCC-SLP  Acute Rehabilitation Services Pager [phone] Office [phone]  Claudine Mouton 03/08/2019, 10:40 AM

## 2019-03-08 NOTE — Evaluation (Signed)
Speech Language Pathology Evaluation Patient Details Name: Corey Mcdonald MRN: [id number] DOB: Jul 08, 1923 Today's Date: 03/08/2019 Time: 0940-1000 SLP Time Calculation (min) (ACUTE ONLY): 20 min  Problem List:  Patient Active Problem List   Diagnosis Date Noted  . Acute CVA (cerebrovascular accident) (HCC) 03/06/2019  . Symptomatic anemia 02/08/2018  . Aortic valve stenosis 07/28/2017  . AAA (abdominal aortic aneurysm) without rupture (HCC) 10/21/2016  . Acute on chronic combined systolic and diastolic CHF (congestive heart failure) (HCC) 01/22/2016  . TIA (transient ischemic attack) 01/09/2016  . Acute on chronic renal failure (HCC)   . CAD in native artery   . Chronic atrial fibrillation (HCC)   . Diastolic heart failure (HCC)   . Acute renal failure (HCC)   . UGI bleed 02/02/2015  . GI bleeding 01/27/2015  . Acute blood loss anemia 01/27/2015  . Chronic kidney disease 01/27/2015  . Pedal edema 01/21/2015  . DOE (dyspnea on exertion) 12/19/2014  . Claudication (HCC) 09/28/2012  . S/P CABG x 4 09/28/2012  . Dyslipidemia 12/04/2009  . RESTLESS LEGS SYNDROME 12/04/2009  . History of endovascular stent graft for abdominal aortic aneurysm 09/15/2008  . VERTIGO 07/28/2008  . Atrial fibrillation (HCC) 10/10/2006  . MACULAR DEGENERATION 08/18/2006  . Essential hypertension 08/18/2006  . Coronary atherosclerosis 08/18/2006  . ATRIAL FLUTTER 08/18/2006  . OSA on CPAP 08/18/2006  . PEPTIC ULCER DISEASE, HX OF 08/18/2006  . COLONIC POLYPS, HX OF 08/18/2006  . BPH (benign prostatic hyperplasia) 08/18/2006   Past Medical History:  Past Medical History:  Diagnosis Date  . AAA (abdominal aortic aneurysm) (HCC)   . ABDOMINAL AORTIC ANEURYSM   . Anemia January 2012   admitted with acute MI March 15, 2010 and has significant acute blood loss anemia requiring transfusions of two units of packed RBCs.  Status post upper endoscopy March 22, 2010 without High-Risk bleeding lesion.   History of peptic ulcer disease  . Atrial fibrillation (HCC)   . BENIGN PROSTATIC HYPERTROPHY, HX OF    . COLONIC POLYPS, HX OF   . CORONARY ARTERY DISEASE    non-ST segment MI, March 15, 2010  . History of Doppler ultrasound 05/2011   h/o claudication, stable ABIs  . History of nuclear stress test 2007   persantine; normal, low risk   . HYPERLIPIDEMIA   . HYPERTENSION   . MACULAR DEGENERATION   . Myocardial infarction (HCC) 02/2010  . PAD (peripheral artery disease) (HCC)   . PEPTIC ULCER DISEASE, HX OF   . PVD (peripheral vascular disease) (HCC)   . RESTLESS LEGS SYNDROME   . SLEEP APNEA   . VERTIGO    Past Surgical History:  Past Surgical History:  Procedure Laterality Date  . ABDOMINAL AORTIC ANEURYSM REPAIR     stenting   . CARDIAC CATHETERIZATION  02/2010   r/t NSTEMI; loss of SVG to RCA  . CATARACT EXTRACTION    . COLONOSCOPY WITH PROPOFOL N/A 02/04/2015   Procedure: COLONOSCOPY WITH PROPOFOL;  Surgeon: Charlott Rakes, MD;  Location: [facility] ENDOSCOPY;  Service: Endoscopy;  Laterality: N/A;  . CORONARY ARTERY BYPASS GRAFT  12/1995   x5; LIMA to ALD, vein graft to RCA (now occluded),SVG to OM, SVG to diagonal  . ESOPHAGOGASTRODUODENOSCOPY N/A 01/28/2015   Procedure: ESOPHAGOGASTRODUODENOSCOPY (EGD);  Surgeon: Vida Rigger, MD;  Location: [facility] ENDOSCOPY;  Service: Endoscopy;  Laterality: N/A;  . ESOPHAGOGASTRODUODENOSCOPY (EGD) WITH PROPOFOL N/A 02/10/2018   Procedure: ESOPHAGOGASTRODUODENOSCOPY (EGD) WITH PROPOFOL;  Surgeon: Bernette Redbird, MD;  Location: [facility] ENDOSCOPY;  Service:  Endoscopy;  Laterality: N/A;  . INGUINAL HERNIA REPAIR     Left inguinal  . TRANSTHORACIC ECHOCARDIOGRAM  04/2011   EF 55-60%; mild LVH; grade 1 diastolic dysfunction   HPI:  Patient is a 86 year old Caucasian male with past medical history significant for coronary artery disease, atrial fibrillation on Eliquis 2.5 Mg p.o. twice daily, abdominal aortic aneurysm, chronic kidney disease stage IIIb,  peptic ulcer disease, PVD, MI, restless leg syndrome, sleep apnea, hypertension and hyperlipidemia.  Patient is a resident at an assisted living facility. At baseline he is able to talk and engage in a conversation although he has memory lapses and his finances and other important tasks have been managed by the daughter over the last 5 years. Patient's carer observed the patient was no longer able to speak.   MRI brain, as per neurology note, a CT revealed moderate size left frontal acute stroke.  Daughter reports that pt has runny nose, frequent coughing at baseline   Assessment / Plan / Recommendation Clinical Impression  Pt demonstrates a profound dysarthria with lingual and labial and suspected velopharyngeal weakness that makes pt completely unintelligible at the word level. When approximating words in repetition, only bilabials /ma/ and /ba/ are discernible. When asked to repeat his name, pt said something, but it did not approximate "Brayton." He is able to follow one step commands suggesting some language comprehension, but given severity of dysarthria, blindness, and lethargy it is difficulty to determine expressive language impairment. Aphasia is possible and could be contributing to errors, but dysrthria is overt and makes pt totally unintelligible. Pt would need to sustain arousal and show good endurance to participate in meaningful speech therapy. Suspect the constellation of advanced age, weakness, severity of deficits, blindness and high risk of aspiration and poor access to nutrition will make successful rehabilition of speech mechanism unlikely. Will follow acutely for plan of care.     SLP Assessment  SLP Recommendation/Assessment: Patient needs continued Speech Lanaguage Pathology Services SLP Visit Diagnosis: Dysarthria and anarthria (R47.1)    Follow Up Recommendations  24 hour supervision/assistance    Frequency and Duration min 2x/week  2 weeks      SLP Evaluation Cognition   Overall Cognitive Status: Difficult to assess Orientation Level: Oriented to person       Comprehension  Auditory Comprehension Overall Auditory Comprehension: Impaired Yes/No Questions: Within Functional Limits Commands: Impaired One Step Basic Commands: 25-49% accurate Conversation: Simple EffectiveTechniques: Extra processing time;Repetition    Expression Verbal Expression Overall Verbal Expression: Other (comment)(unable to determine due to severe dysarthria)   Oral / Motor  Oral Motor/Sensory Function Overall Oral Motor/Sensory Function: Severe impairment Facial ROM: Reduced right;Suspected CN VII (facial) dysfunction Facial Symmetry: Abnormal symmetry right;Suspected CN VII (facial) dysfunction Facial Strength: Reduced right;Suspected CN VII (facial) dysfunction Lingual ROM: Reduced right;Suspected CN XII (hypoglossal) dysfunction Lingual Strength: Reduced Motor Speech Overall Motor Speech: Impaired Respiration: Within functional limits Phonation: Normal Resonance: Hypernasality Articulation: Impaired Level of Impairment: Word Intelligibility: Intelligibility reduced Word: 0-24% accurate   GO                   Herbie Baltimore, MA CCC-SLP  Acute Rehabilitation Services Pager [phone] Office [phone]  Lynann Beaver 03/08/2019, 11:09 AM

## 2019-03-08 NOTE — Progress Notes (Signed)
Report given to [facility] at [facility] house.

## 2019-03-11 LAB — CULTURE, BLOOD (ROUTINE X 2): Culture: NO GROWTH

## 2019-03-25 DEATH — deceased
